# Patient Record
Sex: Female | Born: 1935 | Race: White | Hispanic: No | Smoking: Former smoker
Health system: Southern US, Community
[De-identification: ages and names within clinical notes are randomized; demographics above are authoritative.]

## PROBLEM LIST (undated history)

## (undated) DIAGNOSIS — K589 Irritable bowel syndrome without diarrhea: Secondary | ICD-10-CM

## (undated) DIAGNOSIS — B351 Tinea unguium: Secondary | ICD-10-CM

## (undated) DIAGNOSIS — C801 Malignant (primary) neoplasm, unspecified: Secondary | ICD-10-CM

## (undated) DIAGNOSIS — K219 Gastro-esophageal reflux disease without esophagitis: Secondary | ICD-10-CM

## (undated) DIAGNOSIS — I10 Essential (primary) hypertension: Secondary | ICD-10-CM

## (undated) DIAGNOSIS — C50919 Malignant neoplasm of unspecified site of unspecified female breast: Secondary | ICD-10-CM

## (undated) DIAGNOSIS — E7800 Pure hypercholesterolemia, unspecified: Secondary | ICD-10-CM

## (undated) DIAGNOSIS — K5730 Diverticulosis of large intestine without perforation or abscess without bleeding: Secondary | ICD-10-CM

## (undated) HISTORY — DX: Tinea unguium: B35.1

## (undated) HISTORY — PX: CHOLECYSTECTOMY: SHX55

## (undated) HISTORY — DX: Diverticulosis of large intestine without perforation or abscess without bleeding: K57.30

## (undated) HISTORY — DX: Malignant neoplasm of unspecified site of unspecified female breast: C50.919

## (undated) HISTORY — PX: CATARACT EXTRACTION: SUR2

## (undated) HISTORY — PX: BREAST LUMPECTOMY: SHX2

## (undated) HISTORY — DX: Irritable bowel syndrome, unspecified: K58.9

## (undated) HISTORY — DX: Pure hypercholesterolemia, unspecified: E78.00

## (undated) HISTORY — PX: KNEE ARTHROSCOPY: SUR90

## (undated) HISTORY — PX: ABDOMINAL HYSTERECTOMY: SHX81

## (undated) HISTORY — DX: Gastro-esophageal reflux disease without esophagitis: K21.9

---

## 1998-10-20 ENCOUNTER — Other Ambulatory Visit: Admission: RE | Admit: 1998-10-20 | Discharge: 1998-10-20 | Payer: Self-pay | Admitting: Obstetrics and Gynecology

## 1999-03-17 ENCOUNTER — Other Ambulatory Visit: Admission: RE | Admit: 1999-03-17 | Discharge: 1999-03-17 | Payer: Self-pay | Admitting: Surgery

## 1999-03-31 ENCOUNTER — Other Ambulatory Visit: Admission: RE | Admit: 1999-03-31 | Discharge: 1999-03-31 | Payer: Self-pay | Admitting: Surgery

## 2000-05-02 ENCOUNTER — Ambulatory Visit (HOSPITAL_COMMUNITY): Admission: RE | Admit: 2000-05-02 | Discharge: 2000-05-02 | Payer: Self-pay | Admitting: Internal Medicine

## 2000-07-13 ENCOUNTER — Encounter (HOSPITAL_BASED_OUTPATIENT_CLINIC_OR_DEPARTMENT_OTHER): Payer: Self-pay | Admitting: Internal Medicine

## 2000-07-13 ENCOUNTER — Encounter: Admission: RE | Admit: 2000-07-13 | Discharge: 2000-07-13 | Payer: Self-pay | Admitting: Internal Medicine

## 2001-10-07 ENCOUNTER — Encounter: Admission: RE | Admit: 2001-10-07 | Discharge: 2001-10-07 | Payer: Self-pay | Admitting: Internal Medicine

## 2001-10-07 ENCOUNTER — Encounter (HOSPITAL_BASED_OUTPATIENT_CLINIC_OR_DEPARTMENT_OTHER): Payer: Self-pay | Admitting: Internal Medicine

## 2001-11-01 ENCOUNTER — Encounter: Payer: Self-pay | Admitting: Surgery

## 2001-11-07 ENCOUNTER — Encounter: Payer: Self-pay | Admitting: Surgery

## 2001-11-07 ENCOUNTER — Inpatient Hospital Stay (HOSPITAL_COMMUNITY): Admission: RE | Admit: 2001-11-07 | Discharge: 2001-11-08 | Payer: Self-pay | Admitting: Surgery

## 2002-09-23 ENCOUNTER — Other Ambulatory Visit: Admission: RE | Admit: 2002-09-23 | Discharge: 2002-09-23 | Payer: Self-pay | Admitting: Obstetrics and Gynecology

## 2010-11-08 ENCOUNTER — Other Ambulatory Visit (HOSPITAL_COMMUNITY): Payer: Self-pay | Admitting: Internal Medicine

## 2010-11-08 DIAGNOSIS — R5383 Other fatigue: Secondary | ICD-10-CM

## 2010-11-08 DIAGNOSIS — J90 Pleural effusion, not elsewhere classified: Secondary | ICD-10-CM

## 2010-11-09 ENCOUNTER — Other Ambulatory Visit: Payer: Self-pay | Admitting: Interventional Radiology

## 2010-11-09 ENCOUNTER — Ambulatory Visit (HOSPITAL_COMMUNITY)
Admission: RE | Admit: 2010-11-09 | Discharge: 2010-11-09 | Disposition: A | Discharge status: Home / Self Care | Payer: Medicare Other | Source: Ambulatory Visit | Attending: Internal Medicine | Admitting: Internal Medicine

## 2010-11-09 DIAGNOSIS — J984 Other disorders of lung: Secondary | ICD-10-CM | POA: Insufficient documentation

## 2010-11-09 DIAGNOSIS — R05 Cough: Secondary | ICD-10-CM | POA: Insufficient documentation

## 2010-11-09 DIAGNOSIS — R0602 Shortness of breath: Secondary | ICD-10-CM | POA: Insufficient documentation

## 2010-11-09 DIAGNOSIS — R5383 Other fatigue: Secondary | ICD-10-CM

## 2010-11-09 DIAGNOSIS — R630 Anorexia: Secondary | ICD-10-CM | POA: Insufficient documentation

## 2010-11-09 DIAGNOSIS — Z01812 Encounter for preprocedural laboratory examination: Secondary | ICD-10-CM | POA: Insufficient documentation

## 2010-11-09 DIAGNOSIS — J90 Pleural effusion, not elsewhere classified: Secondary | ICD-10-CM

## 2010-11-09 DIAGNOSIS — R059 Cough, unspecified: Secondary | ICD-10-CM | POA: Insufficient documentation

## 2010-11-09 DIAGNOSIS — E041 Nontoxic single thyroid nodule: Secondary | ICD-10-CM | POA: Insufficient documentation

## 2010-11-09 DIAGNOSIS — R599 Enlarged lymph nodes, unspecified: Secondary | ICD-10-CM | POA: Insufficient documentation

## 2010-11-09 DIAGNOSIS — R5381 Other malaise: Secondary | ICD-10-CM | POA: Insufficient documentation

## 2010-11-09 LAB — BODY FLUID CELL COUNT WITH DIFFERENTIAL
Eos, Fluid: 0 %
Lymphs, Fluid: 30 %
Monocyte-Macrophage-Serous Fluid: 65 % (ref 50–90)
Neutrophil Count, Fluid: 5 % (ref 0–25)
Total Nucleated Cell Count, Fluid: 839 cu mm (ref 0–1000)

## 2010-11-09 MED ORDER — IOHEXOL 300 MG/ML  SOLN
80.0000 mL | Freq: Once | INTRAMUSCULAR | Status: AC | PRN
  Administered 2010-11-09: 80 mL via INTRAVENOUS

## 2010-11-10 ENCOUNTER — Other Ambulatory Visit (HOSPITAL_COMMUNITY): Payer: Self-pay

## 2010-11-10 LAB — PATHOLOGIST SMEAR REVIEW

## 2010-11-13 LAB — BODY FLUID CULTURE
Culture: NO GROWTH
Gram Stain: NONE SEEN

## 2010-11-15 ENCOUNTER — Other Ambulatory Visit: Payer: Self-pay | Admitting: Oncology

## 2010-11-15 DIAGNOSIS — C50919 Malignant neoplasm of unspecified site of unspecified female breast: Secondary | ICD-10-CM

## 2010-11-16 ENCOUNTER — Other Ambulatory Visit: Payer: Self-pay | Admitting: Radiology

## 2010-11-18 ENCOUNTER — Encounter (HOSPITAL_COMMUNITY): Payer: Self-pay

## 2010-11-18 ENCOUNTER — Encounter (HOSPITAL_COMMUNITY)
Admission: RE | Admit: 2010-11-18 | Discharge: 2010-11-18 | Disposition: A | Discharge status: Home / Self Care | Payer: Medicare Other | Source: Ambulatory Visit | Attending: Oncology | Admitting: Oncology

## 2010-11-18 DIAGNOSIS — J984 Other disorders of lung: Secondary | ICD-10-CM | POA: Insufficient documentation

## 2010-11-18 DIAGNOSIS — Z79899 Other long term (current) drug therapy: Secondary | ICD-10-CM | POA: Insufficient documentation

## 2010-11-18 DIAGNOSIS — C787 Secondary malignant neoplasm of liver and intrahepatic bile duct: Secondary | ICD-10-CM | POA: Insufficient documentation

## 2010-11-18 DIAGNOSIS — J90 Pleural effusion, not elsewhere classified: Secondary | ICD-10-CM | POA: Insufficient documentation

## 2010-11-18 DIAGNOSIS — Z7982 Long term (current) use of aspirin: Secondary | ICD-10-CM | POA: Insufficient documentation

## 2010-11-18 DIAGNOSIS — C773 Secondary and unspecified malignant neoplasm of axilla and upper limb lymph nodes: Secondary | ICD-10-CM | POA: Insufficient documentation

## 2010-11-18 DIAGNOSIS — C50919 Malignant neoplasm of unspecified site of unspecified female breast: Secondary | ICD-10-CM | POA: Insufficient documentation

## 2010-11-18 HISTORY — DX: Malignant (primary) neoplasm, unspecified: C80.1

## 2010-11-18 MED ORDER — FLUDEOXYGLUCOSE F - 18 (FDG) INJECTION
18.1000 | Freq: Once | INTRAVENOUS | Status: AC | PRN
  Administered 2010-11-18: 18.1 via INTRAVENOUS

## 2010-11-21 ENCOUNTER — Other Ambulatory Visit: Payer: Self-pay | Admitting: Oncology

## 2010-11-21 ENCOUNTER — Other Ambulatory Visit (HOSPITAL_COMMUNITY): Payer: Medicare Other

## 2010-11-21 ENCOUNTER — Ambulatory Visit (HOSPITAL_COMMUNITY)
Admission: RE | Admit: 2010-11-21 | Discharge: 2010-11-21 | Disposition: A | Discharge status: Home / Self Care | Payer: Medicare Other | Source: Ambulatory Visit | Attending: Oncology | Admitting: Oncology

## 2010-11-21 ENCOUNTER — Encounter (HOSPITAL_BASED_OUTPATIENT_CLINIC_OR_DEPARTMENT_OTHER): Payer: Medicare Other | Admitting: Oncology

## 2010-11-21 DIAGNOSIS — C50919 Malignant neoplasm of unspecified site of unspecified female breast: Secondary | ICD-10-CM

## 2010-11-21 DIAGNOSIS — C801 Malignant (primary) neoplasm, unspecified: Secondary | ICD-10-CM

## 2010-11-21 DIAGNOSIS — R0602 Shortness of breath: Secondary | ICD-10-CM

## 2010-11-21 DIAGNOSIS — C787 Secondary malignant neoplasm of liver and intrahepatic bile duct: Secondary | ICD-10-CM

## 2010-11-21 DIAGNOSIS — Z853 Personal history of malignant neoplasm of breast: Secondary | ICD-10-CM

## 2010-11-21 DIAGNOSIS — C7800 Secondary malignant neoplasm of unspecified lung: Secondary | ICD-10-CM

## 2010-11-21 DIAGNOSIS — J90 Pleural effusion, not elsewhere classified: Secondary | ICD-10-CM | POA: Insufficient documentation

## 2010-11-21 LAB — CBC WITH DIFFERENTIAL/PLATELET
EOS%: 3.4 % (ref 0.0–7.0)
Eosinophils Absolute: 0.3 10*3/uL (ref 0.0–0.5)
MCV: 88.2 fL (ref 79.5–101.0)
MONO%: 5.7 % (ref 0.0–14.0)
NEUT#: 6.3 10*3/uL (ref 1.5–6.5)
RBC: 4.44 10*6/uL (ref 3.70–5.45)
RDW: 13.7 % (ref 11.2–14.5)
lymph#: 1.9 10*3/uL (ref 0.9–3.3)

## 2010-11-21 LAB — COMPREHENSIVE METABOLIC PANEL
Alkaline Phosphatase: 35 U/L — ABNORMAL LOW (ref 39–117)
BUN: 15 mg/dL (ref 6–23)
Glucose, Bld: 128 mg/dL — ABNORMAL HIGH (ref 70–99)
Sodium: 140 mEq/L (ref 135–145)
Total Bilirubin: 0.8 mg/dL (ref 0.3–1.2)

## 2010-11-21 LAB — GLUCOSE, CAPILLARY: Glucose-Capillary: 120 mg/dL — ABNORMAL HIGH (ref 70–99)

## 2010-11-21 LAB — PROTIME-INR: Protime: 12 Seconds (ref 10.6–13.4)

## 2010-11-22 ENCOUNTER — Other Ambulatory Visit: Payer: Self-pay | Admitting: Oncology

## 2010-11-22 DIAGNOSIS — K769 Liver disease, unspecified: Secondary | ICD-10-CM

## 2010-11-24 ENCOUNTER — Other Ambulatory Visit: Payer: Self-pay | Admitting: Oncology

## 2010-11-24 ENCOUNTER — Ambulatory Visit (HOSPITAL_COMMUNITY)
Admission: RE | Admit: 2010-11-24 | Discharge: 2010-11-24 | Disposition: A | Discharge status: Home / Self Care | Payer: Medicare Other | Source: Ambulatory Visit | Attending: Oncology | Admitting: Oncology

## 2010-11-24 DIAGNOSIS — C50919 Malignant neoplasm of unspecified site of unspecified female breast: Secondary | ICD-10-CM | POA: Insufficient documentation

## 2010-11-24 DIAGNOSIS — J90 Pleural effusion, not elsewhere classified: Secondary | ICD-10-CM | POA: Insufficient documentation

## 2010-11-24 DIAGNOSIS — Z9889 Other specified postprocedural states: Secondary | ICD-10-CM

## 2010-11-24 DIAGNOSIS — J9819 Other pulmonary collapse: Secondary | ICD-10-CM | POA: Insufficient documentation

## 2010-11-25 ENCOUNTER — Encounter: Payer: Medicare Other | Admitting: Oncology

## 2010-11-25 ENCOUNTER — Other Ambulatory Visit: Payer: Self-pay | Admitting: Interventional Radiology

## 2010-11-25 ENCOUNTER — Ambulatory Visit (HOSPITAL_COMMUNITY)
Admission: RE | Admit: 2010-11-25 | Discharge: 2010-11-25 | Disposition: A | Discharge status: Home / Self Care | Payer: Medicare Other | Source: Ambulatory Visit | Attending: Oncology | Admitting: Oncology

## 2010-11-25 DIAGNOSIS — Z01810 Encounter for preprocedural cardiovascular examination: Secondary | ICD-10-CM | POA: Insufficient documentation

## 2010-11-25 DIAGNOSIS — K769 Liver disease, unspecified: Secondary | ICD-10-CM

## 2010-11-25 DIAGNOSIS — Z01812 Encounter for preprocedural laboratory examination: Secondary | ICD-10-CM | POA: Insufficient documentation

## 2010-11-25 DIAGNOSIS — Z853 Personal history of malignant neoplasm of breast: Secondary | ICD-10-CM | POA: Insufficient documentation

## 2010-11-25 DIAGNOSIS — I10 Essential (primary) hypertension: Secondary | ICD-10-CM | POA: Insufficient documentation

## 2010-11-25 DIAGNOSIS — C787 Secondary malignant neoplasm of liver and intrahepatic bile duct: Secondary | ICD-10-CM | POA: Insufficient documentation

## 2010-11-25 LAB — CBC
Hemoglobin: 14.1 g/dL (ref 12.0–15.0)
MCH: 30.1 pg (ref 26.0–34.0)
MCV: 87.6 fL (ref 78.0–100.0)
RBC: 4.69 MIL/uL (ref 3.87–5.11)

## 2010-11-29 ENCOUNTER — Other Ambulatory Visit: Payer: Self-pay | Admitting: Oncology

## 2010-11-29 ENCOUNTER — Ambulatory Visit (HOSPITAL_COMMUNITY)
Admission: RE | Admit: 2010-11-29 | Discharge: 2010-11-29 | Disposition: A | Discharge status: Home / Self Care | Payer: Medicare Other | Source: Ambulatory Visit | Attending: Oncology | Admitting: Oncology

## 2010-11-29 ENCOUNTER — Encounter (HOSPITAL_BASED_OUTPATIENT_CLINIC_OR_DEPARTMENT_OTHER): Payer: Medicare Other | Admitting: Oncology

## 2010-11-29 DIAGNOSIS — C7800 Secondary malignant neoplasm of unspecified lung: Secondary | ICD-10-CM

## 2010-11-29 DIAGNOSIS — C787 Secondary malignant neoplasm of liver and intrahepatic bile duct: Secondary | ICD-10-CM

## 2010-11-29 DIAGNOSIS — J90 Pleural effusion, not elsewhere classified: Secondary | ICD-10-CM | POA: Insufficient documentation

## 2010-11-29 DIAGNOSIS — Z853 Personal history of malignant neoplasm of breast: Secondary | ICD-10-CM

## 2010-11-29 DIAGNOSIS — C50919 Malignant neoplasm of unspecified site of unspecified female breast: Secondary | ICD-10-CM | POA: Insufficient documentation

## 2010-11-29 DIAGNOSIS — C801 Malignant (primary) neoplasm, unspecified: Secondary | ICD-10-CM

## 2010-11-30 ENCOUNTER — Other Ambulatory Visit (HOSPITAL_COMMUNITY): Payer: Medicare Other

## 2010-12-28 ENCOUNTER — Other Ambulatory Visit: Payer: Self-pay | Admitting: Oncology

## 2010-12-28 ENCOUNTER — Ambulatory Visit (HOSPITAL_COMMUNITY)
Admission: RE | Admit: 2010-12-28 | Discharge: 2010-12-28 | Disposition: A | Discharge status: Home / Self Care | Payer: Medicare Other | Source: Ambulatory Visit | Attending: Oncology | Admitting: Oncology

## 2010-12-28 DIAGNOSIS — J90 Pleural effusion, not elsewhere classified: Secondary | ICD-10-CM

## 2010-12-28 DIAGNOSIS — C801 Malignant (primary) neoplasm, unspecified: Secondary | ICD-10-CM | POA: Insufficient documentation

## 2010-12-28 DIAGNOSIS — C50919 Malignant neoplasm of unspecified site of unspecified female breast: Secondary | ICD-10-CM

## 2010-12-29 ENCOUNTER — Other Ambulatory Visit: Payer: Self-pay | Admitting: Oncology

## 2010-12-29 ENCOUNTER — Encounter (HOSPITAL_BASED_OUTPATIENT_CLINIC_OR_DEPARTMENT_OTHER): Payer: Medicare Other | Admitting: Oncology

## 2010-12-29 DIAGNOSIS — C801 Malignant (primary) neoplasm, unspecified: Secondary | ICD-10-CM

## 2010-12-29 DIAGNOSIS — Z853 Personal history of malignant neoplasm of breast: Secondary | ICD-10-CM

## 2010-12-29 DIAGNOSIS — C787 Secondary malignant neoplasm of liver and intrahepatic bile duct: Secondary | ICD-10-CM

## 2010-12-29 DIAGNOSIS — C7800 Secondary malignant neoplasm of unspecified lung: Secondary | ICD-10-CM

## 2010-12-29 DIAGNOSIS — C50919 Malignant neoplasm of unspecified site of unspecified female breast: Secondary | ICD-10-CM

## 2010-12-29 LAB — CBC WITH DIFFERENTIAL/PLATELET
Basophils Absolute: 0 10*3/uL (ref 0.0–0.1)
Eosinophils Absolute: 0.3 10*3/uL (ref 0.0–0.5)
HCT: 42.9 % (ref 34.8–46.6)
HGB: 14.5 g/dL (ref 11.6–15.9)
MCH: 29.7 pg (ref 25.1–34.0)
MCV: 87.9 fL (ref 79.5–101.0)
MONO%: 6.1 % (ref 0.0–14.0)
NEUT#: 7.2 10*3/uL — ABNORMAL HIGH (ref 1.5–6.5)
NEUT%: 75 % (ref 38.4–76.8)
Platelets: 205 10*3/uL (ref 145–400)
RDW: 13.8 % (ref 11.2–14.5)

## 2010-12-29 LAB — COMPREHENSIVE METABOLIC PANEL
Albumin: 3.9 g/dL (ref 3.5–5.2)
Alkaline Phosphatase: 46 U/L (ref 39–117)
BUN: 17 mg/dL (ref 6–23)
Calcium: 9.4 mg/dL (ref 8.4–10.5)
Creatinine, Ser: 1.1 mg/dL (ref 0.50–1.10)
Glucose, Bld: 125 mg/dL — ABNORMAL HIGH (ref 70–99)
Potassium: 4.2 mEq/L (ref 3.5–5.3)

## 2011-01-24 ENCOUNTER — Other Ambulatory Visit: Payer: Self-pay | Admitting: Oncology

## 2011-01-24 ENCOUNTER — Ambulatory Visit (HOSPITAL_COMMUNITY)
Admission: RE | Admit: 2011-01-24 | Discharge: 2011-01-24 | Disposition: A | Discharge status: Home / Self Care | Payer: Medicare Other | Source: Ambulatory Visit | Attending: Oncology | Admitting: Oncology

## 2011-01-24 DIAGNOSIS — J90 Pleural effusion, not elsewhere classified: Secondary | ICD-10-CM | POA: Insufficient documentation

## 2011-01-24 DIAGNOSIS — C50919 Malignant neoplasm of unspecified site of unspecified female breast: Secondary | ICD-10-CM

## 2011-01-24 DIAGNOSIS — R0602 Shortness of breath: Secondary | ICD-10-CM | POA: Insufficient documentation

## 2011-01-24 DIAGNOSIS — J984 Other disorders of lung: Secondary | ICD-10-CM | POA: Insufficient documentation

## 2011-01-24 DIAGNOSIS — M47814 Spondylosis without myelopathy or radiculopathy, thoracic region: Secondary | ICD-10-CM | POA: Insufficient documentation

## 2011-01-24 DIAGNOSIS — M47817 Spondylosis without myelopathy or radiculopathy, lumbosacral region: Secondary | ICD-10-CM | POA: Insufficient documentation

## 2011-01-24 DIAGNOSIS — R05 Cough: Secondary | ICD-10-CM | POA: Insufficient documentation

## 2011-01-24 DIAGNOSIS — R059 Cough, unspecified: Secondary | ICD-10-CM | POA: Insufficient documentation

## 2011-01-25 ENCOUNTER — Other Ambulatory Visit: Payer: Self-pay | Admitting: Oncology

## 2011-01-25 ENCOUNTER — Encounter (HOSPITAL_BASED_OUTPATIENT_CLINIC_OR_DEPARTMENT_OTHER): Payer: Medicare Other | Admitting: Oncology

## 2011-01-25 DIAGNOSIS — C50919 Malignant neoplasm of unspecified site of unspecified female breast: Secondary | ICD-10-CM

## 2011-01-25 LAB — CBC WITH DIFFERENTIAL/PLATELET
Eosinophils Absolute: 0.3 10*3/uL (ref 0.0–0.5)
HGB: 14.8 g/dL (ref 11.6–15.9)
MONO#: 0.7 10*3/uL (ref 0.1–0.9)
NEUT#: 5.4 10*3/uL (ref 1.5–6.5)
RBC: 5.18 10*6/uL (ref 3.70–5.45)
RDW: 13.5 % (ref 11.2–14.5)
WBC: 8.8 10*3/uL (ref 3.9–10.3)

## 2011-01-25 LAB — COMPREHENSIVE METABOLIC PANEL
Albumin: 4.4 g/dL (ref 3.5–5.2)
CO2: 26 mEq/L (ref 19–32)
Calcium: 10.1 mg/dL (ref 8.4–10.5)
Chloride: 101 mEq/L (ref 96–112)
Glucose, Bld: 103 mg/dL — ABNORMAL HIGH (ref 70–99)
Potassium: 4.4 mEq/L (ref 3.5–5.3)
Sodium: 139 mEq/L (ref 135–145)
Total Protein: 6.4 g/dL (ref 6.0–8.3)

## 2011-01-26 LAB — CANCER ANTIGEN 27.29: CA 27.29: 641 U/mL — ABNORMAL HIGH (ref 0–39)

## 2011-02-20 ENCOUNTER — Encounter (HOSPITAL_BASED_OUTPATIENT_CLINIC_OR_DEPARTMENT_OTHER): Payer: Medicare Other | Admitting: Oncology

## 2011-02-20 ENCOUNTER — Other Ambulatory Visit: Payer: Self-pay | Admitting: Oncology

## 2011-02-20 ENCOUNTER — Encounter (HOSPITAL_COMMUNITY): Payer: Self-pay

## 2011-02-20 ENCOUNTER — Ambulatory Visit (HOSPITAL_COMMUNITY)
Admission: RE | Admit: 2011-02-20 | Discharge: 2011-02-20 | Disposition: A | Discharge status: Home / Self Care | Payer: Medicare Other | Source: Ambulatory Visit | Attending: Oncology | Admitting: Oncology

## 2011-02-20 DIAGNOSIS — J90 Pleural effusion, not elsewhere classified: Secondary | ICD-10-CM | POA: Insufficient documentation

## 2011-02-20 DIAGNOSIS — C787 Secondary malignant neoplasm of liver and intrahepatic bile duct: Secondary | ICD-10-CM | POA: Insufficient documentation

## 2011-02-20 DIAGNOSIS — C50919 Malignant neoplasm of unspecified site of unspecified female breast: Secondary | ICD-10-CM | POA: Insufficient documentation

## 2011-02-20 DIAGNOSIS — C7800 Secondary malignant neoplasm of unspecified lung: Secondary | ICD-10-CM | POA: Insufficient documentation

## 2011-02-20 HISTORY — DX: Essential (primary) hypertension: I10

## 2011-02-20 LAB — CBC WITH DIFFERENTIAL/PLATELET
BASO%: 0 % (ref 0.0–2.0)
LYMPH%: 22.4 % (ref 14.0–49.7)
MCHC: 33.8 g/dL (ref 31.5–36.0)
MONO#: 0.6 10*3/uL (ref 0.1–0.9)
Platelets: 199 10*3/uL (ref 145–400)
RBC: 4.63 10*6/uL (ref 3.70–5.45)
WBC: 8.3 10*3/uL (ref 3.9–10.3)
lymph#: 1.8 10*3/uL (ref 0.9–3.3)

## 2011-02-20 LAB — CMP (CANCER CENTER ONLY)
ALT(SGPT): 19 U/L (ref 10–47)
CO2: 26 mEq/L (ref 18–33)
Sodium: 141 mEq/L (ref 128–145)
Total Bilirubin: 1.1 mg/dl (ref 0.20–1.60)
Total Protein: 6.9 g/dL (ref 6.4–8.1)

## 2011-02-20 MED ORDER — IOHEXOL 300 MG/ML  SOLN
100.0000 mL | Freq: Once | INTRAMUSCULAR | Status: AC | PRN
  Administered 2011-02-20: 100 mL via INTRAVENOUS

## 2011-02-21 LAB — CANCER ANTIGEN 27.29: CA 27.29: 668 U/mL — ABNORMAL HIGH (ref 0–39)

## 2011-02-27 ENCOUNTER — Encounter (HOSPITAL_BASED_OUTPATIENT_CLINIC_OR_DEPARTMENT_OTHER): Payer: Medicare Other | Admitting: Oncology

## 2011-02-27 DIAGNOSIS — C7800 Secondary malignant neoplasm of unspecified lung: Secondary | ICD-10-CM

## 2011-02-27 DIAGNOSIS — Z853 Personal history of malignant neoplasm of breast: Secondary | ICD-10-CM

## 2011-02-27 DIAGNOSIS — C801 Malignant (primary) neoplasm, unspecified: Secondary | ICD-10-CM

## 2011-02-27 DIAGNOSIS — C787 Secondary malignant neoplasm of liver and intrahepatic bile duct: Secondary | ICD-10-CM

## 2011-05-09 ENCOUNTER — Telehealth: Payer: Self-pay | Admitting: Oncology

## 2011-05-09 NOTE — Telephone Encounter (Signed)
called pts home lmovm for appt in jan2013 and to rtn call to confirm appts

## 2011-05-20 ENCOUNTER — Telehealth: Payer: Self-pay | Admitting: Oncology

## 2011-05-20 NOTE — Telephone Encounter (Signed)
1/9 1/16 already [facility] 4/18 appt and put in a note for pt to have sch printed   aom

## 2011-05-31 ENCOUNTER — Other Ambulatory Visit: Payer: Self-pay | Admitting: Oncology

## 2011-05-31 ENCOUNTER — Other Ambulatory Visit: Payer: Medicare Other | Admitting: Lab

## 2011-05-31 LAB — CBC WITH DIFFERENTIAL/PLATELET
BASO%: 0.4 % (ref 0.0–2.0)
EOS%: 4.2 % (ref 0.0–7.0)
HCT: 38.8 % (ref 34.8–46.6)
LYMPH%: 26.9 % (ref 14.0–49.7)
MCH: 30.2 pg (ref 25.1–34.0)
MCHC: 34.5 g/dL (ref 31.5–36.0)
MONO%: 6.9 % (ref 0.0–14.0)
NEUT%: 61.6 % (ref 38.4–76.8)
Platelets: 189 10*3/uL (ref 145–400)

## 2011-05-31 LAB — COMPREHENSIVE METABOLIC PANEL
ALT: 21 U/L (ref 0–35)
AST: 19 U/L (ref 0–37)
Creatinine, Ser: 1.03 mg/dL (ref 0.50–1.10)
Total Bilirubin: 0.9 mg/dL (ref 0.3–1.2)

## 2011-06-07 ENCOUNTER — Ambulatory Visit (HOSPITAL_BASED_OUTPATIENT_CLINIC_OR_DEPARTMENT_OTHER): Payer: Medicare Other | Admitting: Physician Assistant

## 2011-06-07 ENCOUNTER — Ambulatory Visit (HOSPITAL_COMMUNITY)
Admission: RE | Admit: 2011-06-07 | Discharge: 2011-06-07 | Disposition: A | Discharge status: Home / Self Care | Payer: Medicare Other | Source: Ambulatory Visit | Attending: Physician Assistant | Admitting: Physician Assistant

## 2011-06-07 ENCOUNTER — Encounter: Payer: Self-pay | Admitting: Physician Assistant

## 2011-06-07 VITALS — BP 154/78 | HR 76 | Temp 97.6°F | Ht 63.0 in | Wt 167.0 lb

## 2011-06-07 DIAGNOSIS — C787 Secondary malignant neoplasm of liver and intrahepatic bile duct: Secondary | ICD-10-CM

## 2011-06-07 DIAGNOSIS — Z853 Personal history of malignant neoplasm of breast: Secondary | ICD-10-CM | POA: Insufficient documentation

## 2011-06-07 DIAGNOSIS — J9819 Other pulmonary collapse: Secondary | ICD-10-CM | POA: Insufficient documentation

## 2011-06-07 DIAGNOSIS — Z170 Estrogen receptor positive status [ER+]: Secondary | ICD-10-CM

## 2011-06-07 DIAGNOSIS — C50919 Malignant neoplasm of unspecified site of unspecified female breast: Secondary | ICD-10-CM

## 2011-06-07 HISTORY — DX: Malignant neoplasm of unspecified site of unspecified female breast: C50.919

## 2011-06-07 NOTE — Progress Notes (Signed)
Hematology and Oncology Follow Up Visit  Theresa Wallace [id number] 1923-09-01 78 y.o. 06/07/2011    HPI: Theresa Wallace is an 77 year old [place] woman I saw briefly when she moved to this area more than 5 years ago.  She had had a left breast cancer removed at [facility] in [place] in [phone]  It was grade 2, measured 1 cm maximally and was strongly estrogen and progesterone-receptor positive.  HER-2 was not checked.  This was T1b NX invasive ductal carcinoma, stage I, and she was treated after radiation with anastrozole for several years, eventually switching to Evista primarily for cost reasons.   More recently, she presented to Dr. Kevan Ny with fatigue and a dry cough.  He auscultated dullness to percussion at the left base and obtained a chest x-ray which confirmed the presence of a left pleural effusion.   Dr. Kevan Ny scheduled [name] for an ultrasound-guided left thoracentesis, and this was performed November 09, 2010.  Approximately 1 L of amber-colored fluid was removed.  Cytology from this procedure ([id number]) showed only reactive mesothelial cells.   The patient was then referred here and set up for a CT of the chest and a PET scan.  The CT of the chest on June 20th showed 2 slightly enlarged left axillary lymph nodes with some irregular contours but more importantly multiple pleural nodules in the left chest measuring up to 4.6 cm.  The right chest was normal.  There was no pericardial effusion.  There was only a small to moderate left pleural effusion remaining, and aside from left lower lobe collapse or consolidation, there was no evidence of lung parenchymal involvement.  There was no evidence of bone involvement and also no calcified pleural plaques and no worrisome bone involvement.  PET scan on June 27th showed the nodularity along the left pleura to be intensely hypermetabolic.  For example the large lobe nodule measuring 4.5 cm had an SUV max of 12.9.  There  was no hypermetabolic mediastinal nodal activity.  The to left axillary lymph nodes were very mildly hypermetabolic.  The left breast was clear.  There was 1 hypermetabolic lesion in the liver measuring 3.2 cm.  On June 27th, Dr. Yolanda Bonine performed fine-needle aspiration of a 7 mm abnormal-appearing lymph node in the lower left axilla.  The cytology from this procedure, however, ([name]) also was inconclusive showing no evidence of nodal tissue, mostly blood and fatty tissue    A liver biopsy 11/25/2010 showed metastatic adenocarcinoma, again ER positive, started on exemestane in early July 2012 and continues with good tolerance.    Interim History:   Theresa Wallace returns today accompanied by her significant other, Theresa Wallace, and her daughter,Theresa Wallace, for followup of her metastatic breast carcinoma. She is now been on the eczema stain since July of 2012 and is tolerating it very well. She feels a little cold at times, although she is not sure this is an actual hot flash. She has an occasional cough, no significant phlegm production. No increased shortness of breath. She has a history of irritable bowel syndrome, and alternates somewhat between normal bowel movements and loose stools. She has a little abdominal pain associated with this as well.  Otherwise Theresa Wallace seems to be doing very well. She's had no increased joint pain associated with the eczema stain. No vaginal dryness or discharge. No signs of abnormal bleeding.  A detailed review of systems is otherwise noncontributory as noted below.  Review of Systems: Constitutional:  no weight loss, fever,  night sweats and feels well Eyes: uses glasses [id number]  Cardiovascular: no chest pain or dyspnea on exertion Respiratory: positive for - cough negative for - hemoptysis, orthopnea, pleuritic pain or shortness of breath Neurological: negative Dermatological: negative Gastrointestinal: no abdominal pain, change in bowel habits, or black or bloody  stools Genito-Urinary: no dysuria, trouble voiding, or hematuria Hematological and Lymphatic: negative Breast: negative Musculoskeletal: occasional pain, left leg Remaining ROS negative.  PAST MEDICAL HISTORY:  Aside from the breast cancer history as noted above, includes: 1. Hypercholesterolemia. 2. Benign tremor. 3. Sigmoid diverticulosis. 4. Onychomycosis. 5. Irritable bowel syndrome. 6. GERD. 7. History of palpitations with Holter monitor in 2008 showing PACs. 8. History of hysterectomy with no salpingo-oophorectomy. 9. History of cholecystectomy. 10. History of bilateral knee arthroscopic surgery. 11. History of hemorrhoid surgery.  FAMILY HISTORY:  [name] died in an automobile accident.  [name] died with "bone cancer."  The patient had 3 sisters.  One had breast cancer develop in her 25s.  There is also a cousin with breast cancer but no history of ovarian cancer or other history of cancer in first-degree relatives.  GYNECOLOGIC HISTORY:  She is GX, P4, first pregnancy to term at age 78.  She was on the hormone replacement after her hysterectomy but only briefly and stopped when she had the initial diagnosis of breast cancer in 1996.  SOCIAL HISTORY:  She worked as a Diplomatic Services operational officer.  She has been widowed for 7 years, her husband [name] dying after a long illness involving strokes and a myocardial infarction.  She lives with Theresa Wallace who is a retired Art gallery manager.  Present today also are her daughter Theresa Wallace, who used to be a Diplomatic Services operational officer for Dr. Kevan Ny, and Theresa Wallace's husband, Theresa Wallace, who is a physician's assistant with Dr. Cleophas Dunker.  The patient has 12 grandchildren and 4 great-grandchildren with 1 more on the way.  HEALTH MAINTENANCE:  She tells me she had a colonoscopy within the last 10 years under [facility].  She has a 20-pack-year history of tobacco abuse but quit about 40 years ago.    Medications:   I have reviewed the patient's current  medications.  Current Outpatient Prescriptions  Medication Sig Dispense Refill  . aspirin 81 MG tablet Take 81 mg by mouth daily.      . Calcium Carbonate-Vitamin D (CALCIUM-VITAMIN D) 500-200 MG-UNIT per tablet Take 1 tablet by mouth 2 (two) times daily with a meal.      . cholecalciferol (VITAMIN D) 400 UNITS TABS Take 2,000 Units by mouth daily.      Marland Kitchen exemestane (AROMASIN) 25 MG tablet Take 25 mg by mouth daily after breakfast.      . glucosamine-chondroitin 500-400 MG tablet Take 1 tablet by mouth daily.      Marland Kitchen levothyroxine (SYNTHROID, LEVOTHROID) 25 MCG tablet Take 25 mcg by mouth daily.      Marland Kitchen LORazepam (ATIVAN) 0.5 MG tablet Take 0.25 mg by mouth 2 (two) times daily as needed.      . Multiple Vitamin (MULTIVITAMIN) tablet Take 1 tablet by mouth daily.      . niacin (NIASPAN) 500 MG CR tablet Take 500 mg by mouth at bedtime.      Marland Kitchen omeprazole (PRILOSEC) 40 MG capsule Take 40 mg by mouth daily as needed.      . propranolol (INDERAL) 60 MG tablet Take 60 mg by mouth daily.        Allergies:  Allergies  Allergen Reactions  . Ciprofloxacin   .  Epinephrine   . Tamoxifen     Physical Exam: Filed Vitals:   06/07/11 1307  BP: 154/78  Pulse: 76  Temp: 97.6 F (36.4 C)   HEENT:  Sclerae anicteric, conjunctivae pink.  Oropharynx clear.  No mucositis or candidiasis.   Nodes:  No cervical, supraclavicular, or axillary lymphadenopathy palpated.  Breast Exam:  Right breast, benign, with no masses, skin changes, or nipple inversion. Left breast, status post lumpectomy. No suspicious nodularity or skin changes. No evidence of local recurrence. Lungs:  Clear to auscultation bilaterally.  No crackles, rhonchi, or wheezes.   Heart:  Regular rate and rhythm.   Abdomen:  Soft, nontender.  Positive bowel sounds.  No organomegaly or masses palpated.   Musculoskeletal:  No focal spinal tenderness to palpation.  Extremities:  Benign.  No peripheral edema or cyanosis.   Skin:  Benign.   Neuro:   Nonfocal.   Lab Results: Lab Results  Component Value Date   WBC 7.4 05/31/2011   HGB 13.4 05/31/2011   HCT 38.8 05/31/2011   MCV 87.5 05/31/2011   PLT 189 05/31/2011   NEUTROABS 4.6 05/31/2011     Chemistry      Component Value Date/Time   NA 140 05/31/2011 1343   NA 140 05/31/2011 1343   NA 141 02/20/2011 1228   K 4.8 05/31/2011 1343   K 4.8 05/31/2011 1343   K 4.4 02/20/2011 1228   CL 105 05/31/2011 1343   CL 105 05/31/2011 1343   CL 100 02/20/2011 1228   CO2 24 05/31/2011 1343   CO2 24 05/31/2011 1343   CO2 26 02/20/2011 1228   BUN 23 05/31/2011 1343   BUN 23 05/31/2011 1343   BUN 16 02/20/2011 1228   CREATININE 1.03 05/31/2011 1343   CREATININE 1.03 05/31/2011 1343   CREATININE 0.9 02/20/2011 1228      Component Value Date/Time   CALCIUM 9.7 05/31/2011 1343   CALCIUM 9.7 05/31/2011 1343   CALCIUM 9.1 02/20/2011 1228   ALKPHOS 49 05/31/2011 1343   ALKPHOS 49 05/31/2011 1343   ALKPHOS 57 02/20/2011 1228   AST 19 05/31/2011 1343   AST 19 05/31/2011 1343   AST 20 02/20/2011 1228   ALT 21 05/31/2011 1343   ALT 21 05/31/2011 1343   BILITOT 0.9 05/31/2011 1343   BILITOT 0.9 05/31/2011 1343   BILITOT 1.10 02/20/2011 1228     CA 27. 29 has decreased significantly from 668 in October 2012 to 91 when drawn last week on January 9.   Radiological Studies:  Bilateral screening mammogram at [facility] on 03/01/2011 was unremarkable with benign findings.  Results of the chest x-ray are pending.  Assessment:  An 78 year old [place] woman:  1. Status post left lumpectomy in 1996 for a T1b N0 grade 2 invasive ductal carcinoma which was strongly estrogen and progesterone receptor positive, treated with radiation, then anastrozole, then Evista, all treatment discontinued July 2012.  2. Recurrence to the lining of the left lung and liver documented by liver biopsy July 2012, showing metastatic adenocarcinoma which was estrogen receptor positive.  She has been on exemestane since early July of 2012.   3.     Last thoracentesis was  early in August 2012.  She has          an excellent functional status at present.    Plan:  Theresa Wallace continues to tolerate the eczema stain well and will continue, 25 mg daily. Per Dr. Darrall Dears previous note, we'll obtain a chest x-ray today.  She will then have a repeat CT of the chest open parent and not abdomen, so she will not need oral contrast) in early April, followed by an appointment with Dr. Darnelle Catalan that time.   This plan was reviewed with the patient, who voices understanding and agreement.  She knows to call with any changes or problems.    Zaidin Blyden, PA-C 06/07/2011

## 2011-06-26 ENCOUNTER — Telehealth: Payer: Self-pay | Admitting: *Deleted

## 2011-06-26 NOTE — Telephone Encounter (Signed)
Confirmed 07/03/11 appt w/ pt's grand-daughter Monica.

## 2011-07-03 ENCOUNTER — Ambulatory Visit: Payer: Medicare Other

## 2011-07-03 ENCOUNTER — Ambulatory Visit: Payer: Medicare Other | Admitting: Lab

## 2011-07-03 NOTE — Progress Notes (Signed)
Pt seen for genetic counseling.  Blood drawn for Multisite 3 with reflex to sequencing if neg

## 2011-07-12 ENCOUNTER — Telehealth: Payer: Self-pay | Admitting: Oncology

## 2011-07-12 NOTE — Telephone Encounter (Signed)
S/w the pt and she is aware of the change of the time of her appt on 09/07/2011@1 :45pm. Advised the pt to still keep the lab and ct scan appt the same

## 2011-07-14 ENCOUNTER — Other Ambulatory Visit: Payer: Self-pay | Admitting: *Deleted

## 2011-07-14 DIAGNOSIS — C50919 Malignant neoplasm of unspecified site of unspecified female breast: Secondary | ICD-10-CM

## 2011-07-14 MED ORDER — LORAZEPAM 0.5 MG PO TABS: ORAL_TABLET | ORAL | Status: DC

## 2011-08-30 ENCOUNTER — Other Ambulatory Visit (HOSPITAL_BASED_OUTPATIENT_CLINIC_OR_DEPARTMENT_OTHER): Payer: Medicare Other | Admitting: Lab

## 2011-08-30 ENCOUNTER — Ambulatory Visit (HOSPITAL_COMMUNITY)
Admission: RE | Admit: 2011-08-30 | Discharge: 2011-08-30 | Disposition: A | Discharge status: Home / Self Care | Payer: Medicare Other | Source: Ambulatory Visit | Attending: Physician Assistant | Admitting: Physician Assistant

## 2011-08-30 DIAGNOSIS — I700 Atherosclerosis of aorta: Secondary | ICD-10-CM | POA: Insufficient documentation

## 2011-08-30 DIAGNOSIS — C787 Secondary malignant neoplasm of liver and intrahepatic bile duct: Secondary | ICD-10-CM

## 2011-08-30 DIAGNOSIS — C50919 Malignant neoplasm of unspecified site of unspecified female breast: Secondary | ICD-10-CM | POA: Insufficient documentation

## 2011-08-30 LAB — CMP (CANCER CENTER ONLY)
ALT(SGPT): 23 U/L (ref 10–47)
CO2: 27 mEq/L (ref 18–33)
Chloride: 101 mEq/L (ref 98–108)
Sodium: 142 mEq/L (ref 128–145)
Total Bilirubin: 1.2 mg/dl (ref 0.20–1.60)
Total Protein: 6.9 g/dL (ref 6.4–8.1)

## 2011-08-30 MED ORDER — IOHEXOL 300 MG/ML  SOLN
80.0000 mL | Freq: Once | INTRAMUSCULAR | Status: AC | PRN
  Administered 2011-08-30: 80 mL via INTRAVENOUS

## 2011-09-07 ENCOUNTER — Other Ambulatory Visit: Payer: Medicare Other | Admitting: Lab

## 2011-09-07 ENCOUNTER — Telehealth: Payer: Self-pay | Admitting: *Deleted

## 2011-09-07 ENCOUNTER — Encounter: Payer: Self-pay | Admitting: Physician Assistant

## 2011-09-07 ENCOUNTER — Ambulatory Visit: Payer: Medicare Other | Admitting: Oncology

## 2011-09-07 ENCOUNTER — Ambulatory Visit (HOSPITAL_BASED_OUTPATIENT_CLINIC_OR_DEPARTMENT_OTHER): Payer: Medicare Other | Admitting: Physician Assistant

## 2011-09-07 ENCOUNTER — Ambulatory Visit (HOSPITAL_BASED_OUTPATIENT_CLINIC_OR_DEPARTMENT_OTHER): Payer: Medicare Other | Admitting: Lab

## 2011-09-07 VITALS — BP 122/69 | HR 72 | Temp 98.8°F | Ht 63.0 in | Wt 168.6 lb

## 2011-09-07 DIAGNOSIS — C800 Disseminated malignant neoplasm, unspecified: Secondary | ICD-10-CM

## 2011-09-07 DIAGNOSIS — Z170 Estrogen receptor positive status [ER+]: Secondary | ICD-10-CM

## 2011-09-07 DIAGNOSIS — C50919 Malignant neoplasm of unspecified site of unspecified female breast: Secondary | ICD-10-CM

## 2011-09-07 DIAGNOSIS — Z79811 Long term (current) use of aromatase inhibitors: Secondary | ICD-10-CM

## 2011-09-07 LAB — CBC WITH DIFFERENTIAL/PLATELET
BASO%: 0.4 % (ref 0.0–2.0)
EOS%: 3.1 % (ref 0.0–7.0)
LYMPH%: 26.9 % (ref 14.0–49.7)
MCHC: 32.9 g/dL (ref 31.5–36.0)
MONO#: 0.5 10*3/uL (ref 0.1–0.9)
MONO%: 6.3 % (ref 0.0–14.0)
Platelets: 176 10*3/uL (ref 145–400)
RBC: 4.45 10*6/uL (ref 3.70–5.45)
WBC: 7.9 10*3/uL (ref 3.9–10.3)
nRBC: 0 % (ref 0–0)

## 2011-09-07 LAB — CANCER ANTIGEN 27.29: CA 27.29: 36 U/mL (ref 0–39)

## 2011-09-07 NOTE — Progress Notes (Signed)
ID: Theresa Wallace   DOB: 1924-04-29  MR#: [id number]  NGE#:[id number]  HISTORY OF PRESENT ILLNESS: Theresa Wallace is an 78 year old [place] woman I saw briefly when she moved to this area more than 5 years ago. She had had a left breast cancer removed at [facility] in [place] in [phone]). It was grade 2, measured 1 cm maximally and was strongly estrogen and progesterone-receptor positive. HER-2 was not checked. This was T1b NX invasive ductal carcinoma, stage I, and she was treated after radiation with anastrozole for several years, eventually switching to Evista primarily for cost reasons.  More recently, she presented to Theresa Wallace with fatigue and a dry cough. He auscultated dullness to percussion at the left base and obtained a chest x-ray which confirmed the presence of a left pleural effusion.  Theresa Wallace scheduled [name] for an ultrasound-guided left thoracentesis, and this was performed November 09, 2010. Approximately 1 L of amber-colored fluid was removed. Cytology from this procedure ([id number]) showed only reactive mesothelial cells.  The patient was then referred here and set up for a CT of the chest and a PET scan. The CT of the chest on June 20th showed 2 slightly enlarged left axillary lymph nodes with some irregular contours but more importantly multiple pleural nodules in the left chest measuring up to 4.6 cm. The right chest was normal. There was no pericardial effusion. There was only a small to moderate left pleural effusion remaining, and aside from left lower lobe collapse or consolidation, there was no evidence of lung parenchymal involvement. There was no evidence of bone involvement and also no calcified pleural plaques and no worrisome bone involvement. PET scan on June 27th showed the nodularity along the left pleura to be intensely hypermetabolic. For example the large lobe nodule measuring 4.5 cm had an SUV max of 12.9. There was no hypermetabolic  mediastinal nodal activity. The to left axillary lymph nodes were very mildly hypermetabolic. The left breast was clear. There was 1 hypermetabolic lesion in the liver measuring 3.2 cm. On June 27th, Theresa Wallace performed fine-needle aspiration of a 7 mm abnormal-appearing lymph node in the lower left axilla. The cytology from this procedure, however, ([name]) also was inconclusive showing no evidence of nodal tissue, mostly blood and fatty tissue  A liver biopsy 11/25/2010 showed metastatic adenocarcinoma, again ER positive, started on exemestane in early July 2012 and continues with good tolerance.   The patient is known to be BRCA1 and BRCA2 negative.  INTERVAL HISTORY: [name] returns today accompanied by her significant other, [name], and her daughter, Theresa Wallace, for followup of metastatic breast carcinoma. Interval history is unremarkable, and [name] is feeling well. She is planning a trip to [place] to celebrate her granddaughter's upcoming wedding. (This is Theresa Wallace is middle daughter.)  [name] continues on exemestane which she is tolerating very well. She feels a little cold at times, but denies actual hot flashes. She's had no increased joint pain. No vaginal dryness.  Interval history is remarkable for recent chest CT showing resolution of a left pleural effusion and left pleural metastasis suggesting an excellent response to therapy. There were no additional abnormal findings.  REVIEW OF SYSTEMS: [name] has had no fevers or night sweats. No rashes or abnormal bleeding. Her nails seem to be a little more brittle than they used to be. She occasionally has some mild queasiness, but no emesis. No change in bowel habits. No cough, phlegm production, shortness of breath, or chest pain. She  has occasional mild headaches, but no dizziness or change in vision. She feels a little anxious or "jumpy" on occasion, but rarely uses her lorazepam.  A detailed review of systems is otherwise  noncontributory.  PAST MEDICAL HISTORY: Past Medical History  Diagnosis Date  . Hypertension   . Cancer     breast ca  . Breast cancer metastasized to multiple sites 06/07/2011    PAST SURGICAL HISTORY: No past surgical history on file.  FAMILY HISTORY No family history on file. [name] died in an automobile accident. [name] died with "bone cancer." The patient had 3 sisters. One had breast cancer develop in her 43s. There is also a cousin with breast cancer but no history of ovarian cancer or other history of cancer in first-degree relatives.  GYNECOLOGIC HISTORY: She is GX, P4, first pregnancy to term at age 29. She was on the hormone replacement after her hysterectomy but only briefly and stopped when she had the initial diagnosis of breast cancer in 1996.  SOCIAL HISTORY:  She worked as a Diplomatic Services operational officer. She has been widowed for 7 years, her husband Theresa Wallace dying after a long illness involving strokes and a myocardial infarction. She lives with Theresa Wallace who is a retired Art gallery manager. Present today also are her daughter Theresa Wallace, who used to be a Diplomatic Services operational officer for Theresa Wallace, and Theresa's husband, Theresa Wallace, who is a physician's assistant with Theresa Wallace. The patient has 12 grandchildren and 4 great-grandchildren with 1 more on the way.   ADVANCED DIRECTIVES:  HEALTH MAINTENANCE: History  Substance Use Topics  . Smoking status: Former Smoker    Quit date: 05/22/1968  . Smokeless tobacco: Never Used  . Alcohol Use: Yes     Colonoscopy: within the past 10 yrs, Theresa Wallace  PAP:  Bone density: Approx 10 yrs ago  Lipid panel:  Allergies  Allergen Reactions  . Ciprofloxacin   . Epinephrine   . Tamoxifen     Current Outpatient Prescriptions  Medication Sig Dispense Refill  . aspirin 81 MG tablet Take 81 mg by mouth daily.      . Calcium Carbonate-Vitamin D (CALCIUM-VITAMIN D) 500-200 MG-UNIT per tablet Take 1 tablet by mouth 2 (two) times daily  with a meal.      . cholecalciferol (VITAMIN D) 400 UNITS TABS Take 2,000 Units by mouth daily.      Marland Kitchen exemestane (AROMASIN) 25 MG tablet Take 25 mg by mouth daily after breakfast.      . glucosamine-chondroitin 500-400 MG tablet Take 1 tablet by mouth daily.      Marland Kitchen levothyroxine (SYNTHROID, LEVOTHROID) 25 MCG tablet Take 25 mcg by mouth daily.      Marland Kitchen LORazepam (ATIVAN) 0.5 MG tablet TAKE 1/2 TO ONE TABLET BY MOUTH TWICE DAILY PRN ANXIETY  15 tablet  0  . Multiple Vitamin (MULTIVITAMIN) tablet Take 1 tablet by mouth daily.      . niacin (NIASPAN) 500 MG CR tablet Take 500 mg by mouth at bedtime.      . propranolol (INDERAL) 60 MG tablet Take 60 mg by mouth daily.        OBJECTIVE: Filed Vitals:   09/07/11 1346  BP: 122/69  Pulse: 72  Temp: 98.8 F (37.1 C)     Body mass index is 29.87 kg/(m^2).    ECOG FS: 0  Physical Exam: HEENT:  Sclerae anicteric, conjunctivae pink.  Oropharynx clear.  Nodes:  No cervical, supraclavicular, or axillary lymphadenopathy palpated.  Breast Exam:  Right breast is benign, no masses, skin changes, or nipple inversion. Left breast status post lumpectomy with well-healed incision. No suspicious nodularities, skin changes, or evidence of local recurrence. Lungs:  Clear to auscultation bilaterally.  No crackles, rhonchi, or wheezes.   Heart:  Regular rate and rhythm.   Abdomen:  Soft, nontender.  Positive bowel sounds.  No organomegaly or masses palpated.   Musculoskeletal:  No focal spinal tenderness to palpation.  Extremities:  Benign.  No peripheral edema or cyanosis.   Skin:  Benign.   Neuro:  Nonfocal. Alert and oriented x3.    LAB RESULTS: Lab Results  Component Value Date   WBC 7.4 05/31/2011   NEUTROABS 4.6 05/31/2011   HGB 13.4 05/31/2011   HCT 38.8 05/31/2011   MCV 87.5 05/31/2011   PLT 189 05/31/2011      Chemistry      Component Value Date/Time   NA 142 08/30/2011 1253   NA 140 05/31/2011 1343   NA 140 05/31/2011 1343   K 4.2 08/30/2011 1253   K  4.8 05/31/2011 1343   K 4.8 05/31/2011 1343   CL 101 08/30/2011 1253   CL 105 05/31/2011 1343   CL 105 05/31/2011 1343   CO2 27 08/30/2011 1253   CO2 24 05/31/2011 1343   CO2 24 05/31/2011 1343   BUN 17 08/30/2011 1253   BUN 23 05/31/2011 1343   BUN 23 05/31/2011 1343   CREATININE 1.0 08/30/2011 1253   CREATININE 1.03 05/31/2011 1343   CREATININE 1.03 05/31/2011 1343      Component Value Date/Time   CALCIUM 9.1 08/30/2011 1253   CALCIUM 9.7 05/31/2011 1343   CALCIUM 9.7 05/31/2011 1343   ALKPHOS 45 08/30/2011 1253   ALKPHOS 49 05/31/2011 1343   ALKPHOS 49 05/31/2011 1343   AST 25 08/30/2011 1253   AST 19 05/31/2011 1343   AST 19 05/31/2011 1343   ALT 21 05/31/2011 1343   ALT 21 05/31/2011 1343   BILITOT 1.20 08/30/2011 1253   BILITOT 0.9 05/31/2011 1343   BILITOT 0.9 05/31/2011 1343       Lab Results  Component Value Date   LABCA2 91* 05/31/2011   Lab results from 09/07/2011 are pending, including a CBC and CA27.29.  STUDIES: Ct Chest W Contrast  08/30/2011  *RADIOLOGY REPORT*  Clinical Data: Metastatic breast cancer.  CT CHEST WITH CONTRAST  Technique:  Multidetector CT imaging of the chest was performed following the standard protocol during bolus administration of intravenous contrast.  Contrast: 80mL OMNIPAQUE IOHEXOL 300 MG/ML  SOLN chest CT 02/20/2011.  Comparison: Chest CT 02/20/2011.  Findings: The chest wall is stable.  No obvious breast mass, supraclavicular or axillary lymphadenopathy.  Stable surgical changes involving the left breast.  The bony thorax is intact.  No destructive bone lesions or spinal canal compromise.  The heart is normal in size.  No pericardial effusion.  There are small scattered mediastinal and hilar lymph nodes but no adenopathy.  The aorta is normal in caliber.  Stable atherosclerotic calcifications.  The esophagus is grossly normal.  Examination of the lung parenchyma demonstrates no acute pulmonary findings.  No pulmonary metastatic disease or pleural effusion. The numerous pleural  metastasis on the left are no longer discernible.  The left pleural effusion has resolved.  The upper abdomen is unremarkable.  No obvious hepatic metastasis.  IMPRESSION:  1.  Resolution of left pleural effusion and left pleural metastasis suggesting an excellent response to therapy. 2.  No findings for pulmonary  metastatic disease. 3.  No obvious liver metastasis.  Original Report Authenticated By: P. Loralie Champagne, M.D.    ASSESSMENT: An 78 year old [place] woman:  1. Status post left lumpectomy in 1996 for a T1b N0 grade 2 invasive ductal carcinoma which was strongly estrogen and progesterone receptor positive, treated with radiation, then anastrozole, then Evista, all treatment discontinued July 2012.  2. Recurrence to the lining of the left lung and liver documented by liver biopsy July 2012, showing metastatic adenocarcinoma which was estrogen receptor positive. She has been on exemestane since early July of 2012.  3. Last thoracentesis was early in August 2012. She has an excellent functional status at present.  4. Patient is known to be BRCA1 and BRCA2 negative.      PLAN: With regards to her metastatic breast cancer, [name] seems to be doing extremely well, and will continue on the exemestane as before. She'll return in 3 months for routine followup and repeat labs. Dr. Darnelle Catalan will decide at that time whether or not to proceed with any additional x-rays or scans.  The patient and her family voice understanding and agreement with this plan, and will call for any changes or problems.   Izabelle Daus    09/07/2011

## 2011-09-07 NOTE — Telephone Encounter (Signed)
gave patient appointment for 11-2011 printed out calendar and  gave to the patient 

## 2011-09-25 ENCOUNTER — Other Ambulatory Visit (HOSPITAL_COMMUNITY): Payer: Self-pay | Admitting: Internal Medicine

## 2011-09-25 DIAGNOSIS — Z5189 Encounter for other specified aftercare: Secondary | ICD-10-CM

## 2011-09-25 DIAGNOSIS — C50912 Malignant neoplasm of unspecified site of left female breast: Secondary | ICD-10-CM

## 2011-10-20 ENCOUNTER — Telehealth: Payer: Self-pay | Admitting: *Deleted

## 2011-10-20 NOTE — Telephone Encounter (Signed)
per patient request on 10-20-2011 moved appointments to 11-13-2011 lab only 11-20-2011 md at 11:00am

## 2011-10-23 ENCOUNTER — Ambulatory Visit (HOSPITAL_COMMUNITY)
Admission: RE | Admit: 2011-10-23 | Discharge: 2011-10-23 | Disposition: A | Discharge status: Home / Self Care | Payer: Medicare Other | Source: Ambulatory Visit | Attending: Internal Medicine | Admitting: Internal Medicine

## 2011-10-23 DIAGNOSIS — Z780 Asymptomatic menopausal state: Secondary | ICD-10-CM | POA: Insufficient documentation

## 2011-10-23 DIAGNOSIS — C50912 Malignant neoplasm of unspecified site of left female breast: Secondary | ICD-10-CM

## 2011-10-23 DIAGNOSIS — Z13820 Encounter for screening for osteoporosis: Secondary | ICD-10-CM | POA: Insufficient documentation

## 2011-10-23 DIAGNOSIS — Z5189 Encounter for other specified aftercare: Secondary | ICD-10-CM

## 2011-11-13 ENCOUNTER — Other Ambulatory Visit (HOSPITAL_BASED_OUTPATIENT_CLINIC_OR_DEPARTMENT_OTHER): Payer: Medicare Other | Admitting: Lab

## 2011-11-13 DIAGNOSIS — C50919 Malignant neoplasm of unspecified site of unspecified female breast: Secondary | ICD-10-CM

## 2011-11-13 LAB — COMPREHENSIVE METABOLIC PANEL
Albumin: 4.1 g/dL (ref 3.5–5.2)
CO2: 29 mEq/L (ref 19–32)
Glucose, Bld: 166 mg/dL — ABNORMAL HIGH (ref 70–99)
Potassium: 4.4 mEq/L (ref 3.5–5.3)
Sodium: 133 mEq/L — ABNORMAL LOW (ref 135–145)
Total Protein: 6.2 g/dL (ref 6.0–8.3)

## 2011-11-13 LAB — CBC WITH DIFFERENTIAL/PLATELET
Eosinophils Absolute: 0.1 10*3/uL (ref 0.0–0.5)
MONO#: 0.7 10*3/uL (ref 0.1–0.9)
NEUT#: 7.7 10*3/uL — ABNORMAL HIGH (ref 1.5–6.5)
Platelets: 191 10*3/uL (ref 145–400)
RBC: 4.51 10*6/uL (ref 3.70–5.45)
RDW: 14.6 % — ABNORMAL HIGH (ref 11.2–14.5)
WBC: 10.6 10*3/uL — ABNORMAL HIGH (ref 3.9–10.3)

## 2011-11-20 ENCOUNTER — Ambulatory Visit: Payer: Medicare Other | Admitting: Oncology

## 2011-11-21 ENCOUNTER — Ambulatory Visit (HOSPITAL_BASED_OUTPATIENT_CLINIC_OR_DEPARTMENT_OTHER): Payer: Medicare Other | Admitting: Oncology

## 2011-11-21 ENCOUNTER — Telehealth: Payer: Self-pay | Admitting: *Deleted

## 2011-11-21 VITALS — BP 146/77 | HR 76 | Temp 98.5°F | Ht 63.0 in | Wt 163.7 lb

## 2011-11-21 DIAGNOSIS — C50919 Malignant neoplasm of unspecified site of unspecified female breast: Secondary | ICD-10-CM

## 2011-11-21 DIAGNOSIS — C800 Disseminated malignant neoplasm, unspecified: Secondary | ICD-10-CM

## 2011-11-21 MED ORDER — GABAPENTIN 300 MG PO CAPS: 300.0000 mg | ORAL_CAPSULE | Freq: Every evening | ORAL | Status: DC | PRN

## 2011-11-21 NOTE — Telephone Encounter (Signed)
Gave patient appointment for 02-28-2012 starting at 1:15pm with labs gave patient instruction on getting the chest x-ray done before the appointment on 02-28-2012 at the radiology department

## 2011-11-21 NOTE — Progress Notes (Signed)
ID: Theresa Wallace   DOB: 01-28-1924  MR#: [id number]  WUJ#:[id number]  HISTORY OF PRESENT ILLNESS: Theresa Wallace is an 78 year old [place] woman I saw briefly when she moved to this area in 2007.  She had had a left breast cancer removed at [facility] in [place] in [phone]).  It was grade 2, measured 1 cm maximally and was strongly estrogen and progesterone-receptor positive.  HER-2 was not checked.  This was T1b NX invasive ductal carcinoma, stage I, and she was treated after radiation with anastrozole for several years, eventually switching to Evista primarily for cost reasons.   More recently, she presented to Dr. Kevan Ny with fatigue and a dry cough.  He auscultated dullness to percussion at the left base and obtained a chest x-ray which confirmed the presence of a left pleural effusion.   Dr. Kevan Ny scheduled Theresa Wallace for an ultrasound-guided left thoracentesis, and this was performed November 09, 2010.  Approximately 1 L of amber-colored fluid was removed.  Cytology from this procedure ([id number]) showed only reactive mesothelial cells.   The patient was then referred here and set up for a CT of the chest and a PET scan.  The CT of the chest on June 20th showed 2 slightly enlarged left axillary lymph nodes with some irregular contours but more importantly multiple pleural nodules in the left chest measuring up to 4.6 cm.  The right chest was normal.  There was no pericardial effusion.  There was only a small to moderate left pleural effusion remaining, and aside from left lower lobe collapse or consolidation, there was no evidence of lung parenchymal involvement.  There was no evidence of bone involvement and also no calcified pleural plaques and no worrisome bone involvement.  PET scan on June 27th showed the nodularity along the left pleura to be intensely hypermetabolic.  For example the large lobe nodule measuring 4.5 cm had an SUV max of 12.9.  There was no hypermetabolic  mediastinal nodal activity.  The to left axillary lymph nodes were very mildly hypermetabolic.  The left breast was clear.  There was 1 hypermetabolic lesion in the liver measuring 3.2 cm.  On June 27th, Dr. Yolanda Bonine performed fine-needle aspiration of a 7 mm abnormal-appearing lymph node in the lower left axilla.  The cytology from this procedure, however, ([name]) also was inconclusive showing no evidence of nodal tissue, mostly blood and fatty tissue. Her subsequent history is as detailed below  INTERVAL HISTORY: Theresa Wallace returns with her significant other, [name], for followup of her breast cancer. The interval history is significant for her having developed shingles involving the left flank and left breast second week in June. She was treated with Valtrex for this, but still is very uncomfortable.  REVIEW OF SYSTEMS: Aside from the pain, she is somewhat constipated from the narcotics and therefore does not like to take them. She has Lidoderm patches but is not using them right now. She feels tired, and falls asleep all the time. She sleeps okay at night according to [facility]. She has a little bit of hoarseness, some problems with urinary leakage, and aside from the shingles a rash in her upper left back which doesn't cause her any problems and which has been evaluated by Dr. Nicholas Lose. She is tolerating the letrozole with no side effects that she is aware of. A detailed review of systems is otherwise unremarkable.  PAST MEDICAL HISTORY: Past Medical History  Diagnosis Date  . Hypertension   . Cancer  breast ca  . Breast cancer metastasized to multiple sites 06/07/2011  1. Hypercholesterolemia. 2. Benign tremor. 3. Sigmoid diverticulosis. 4. Onychomycosis. 5. Irritable bowel syndrome. 6. GERD. 7. History of palpitations with Holter monitor in 2008 showing PACs. 8. History of hysterectomy with no salpingo-oophorectomy. 9. History of cholecystectomy. 10. History of bilateral knee arthroscopic  surgery. History of hemorrhoid surgery.  FAMILY HISTORY [name] died in an automobile accident.  [name] died with "bone cancer."  The patient had 3 sisters.  One had breast cancer develop in her 64s.  There is also a cousin with breast cancer but no history of ovarian cancer or other history of cancer in first-degree relatives.  GYNECOLOGIC HISTORY: She is GX, P4, first pregnancy to term at age 78.  She was on the hormone replacement after her hysterectomy but only briefly and stopped when she had the initial diagnosis of breast cancer in 1996.  SOCIAL HISTORY: She worked as a Diplomatic Services operational officer.  She has been widowed since 2005, her husband [name] dying after a long illness involving strokes and a myocardial infarction.  She lives with [name] who is a retired Art gallery manager.  Her daughter [name] used to be a Diplomatic Services operational officer for Dr. Kevan Ny, and [name]'s husband, [name], is a physician's assistant with Dr. Cleophas Dunker.  The patient has 12 grandchildren and 4 great-grandchildren with 1 more on the way.   ADVANCED DIRECTIVES: In place  HEALTH MAINTENANCE: History  Substance Use Topics  . Smoking status: Former Smoker    Quit date: 05/22/1968  . Smokeless tobacco: Never Used  . Alcohol Use: Yes     Colonoscopy:  PAP:  Bone density:  Lipid panel:  Allergies  Allergen Reactions  . Ciprofloxacin   . Epinephrine   . Tamoxifen     Current Outpatient Prescriptions  Medication Sig Dispense Refill  . Calcium Carbonate-Vitamin D (CALCIUM-VITAMIN D) 500-200 MG-UNIT per tablet Take 1 tablet by mouth 2 (two) times daily with a meal.      . cholecalciferol (VITAMIN D) 400 UNITS TABS Take 2,000 Units by mouth daily.      Marland Kitchen exemestane (AROMASIN) 25 MG tablet Take 25 mg by mouth daily after breakfast.      . glucosamine-chondroitin 500-400 MG tablet Take 1 tablet by mouth daily.      Marland Kitchen HYDROcodone-acetaminophen (VICODIN) 5-500 MG per tablet       . levothyroxine (SYNTHROID,  LEVOTHROID) 25 MCG tablet Take 25 mcg by mouth daily.      Marland Kitchen LORazepam (ATIVAN) 0.5 MG tablet TAKE 1/2 TO ONE TABLET BY MOUTH TWICE DAILY PRN ANXIETY  15 tablet  0  . Multiple Vitamin (MULTIVITAMIN) tablet Take 1 tablet by mouth daily.      . propranolol (INDERAL) 60 MG tablet Take 60 mg by mouth daily.      Marland Kitchen aspirin 81 MG tablet Take 81 mg by mouth daily.      . niacin (NIASPAN) 500 MG CR tablet Take 500 mg by mouth at bedtime.        OBJECTIVE: Elderly white woman who appears moderately uncomfortable Filed Vitals:   11/21/11 0937  BP: 146/77  Pulse: 76  Temp: 98.5 F (36.9 Wallace)     Body mass index is 29.00 kg/(m^2).    ECOG FS: 2  Sclerae unicteric; slight left ptosis Oropharynx clear No cervical or supraclavicular adenopathy Lungs no rales or rhonchi Heart regular rate and rhythm Abd benign MSK no focal spinal tenderness, no peripheral edema Neuro: nonfocal Breasts:  The right breast is unremarkable. The left breast is status post lumpectomy. There are several of non-confluent lesions on the left breast, some of which are crusted over. There is no evidence of disease recurrence  LAB RESULTS: Lab Results  Component Value Date   WBC 10.6* 11/13/2011   NEUTROABS 7.7* 11/13/2011   HGB 13.6 11/13/2011   HCT 40.2 11/13/2011   MCV 89.2 11/13/2011   PLT 191 11/13/2011      Chemistry      Component Value Date/Time   NA 133* 11/13/2011 0955   NA 142 08/30/2011 1253   K 4.4 11/13/2011 0955   K 4.2 08/30/2011 1253   CL 98 11/13/2011 0955   CL 101 08/30/2011 1253   CO2 29 11/13/2011 0955   CO2 27 08/30/2011 1253   BUN 20 11/13/2011 0955   BUN 17 08/30/2011 1253   CREATININE 0.91 11/13/2011 0955   CREATININE 1.0 08/30/2011 1253      Component Value Date/Time   CALCIUM 8.9 11/13/2011 0955   CALCIUM 9.1 08/30/2011 1253   ALKPHOS 49 11/13/2011 0955   ALKPHOS 45 08/30/2011 1253   AST 17 11/13/2011 0955   AST 25 08/30/2011 1253   ALT 30 11/13/2011 0955   BILITOT 1.1 11/13/2011 0955   BILITOT 1.20  08/30/2011 1253       Lab Results  Component Value Date   LABCA2 34 11/13/2011    No components found with this basename: [id number]    No results found for this basename: INR:1;PROTIME:1 in the last 168 hours  Urinalysis No results found for this basename: colorurine, appearanceur, labspec, phurine, glucoseu, hgbur, bilirubinur, ketonesur, proteinur, urobilinogen, nitrite, leukocytesur    STUDIES: Dg Bone Density  10/23/2011  The Bone Mineral Densitometry  Report was read as within normal limits   ASSESSMENT: 78 y.o. [place] woman:   (1) Status post left lumpectomy in 1996 for a T1b N0 grade 2 invasive ductal carcinoma which was strongly estrogen and progesterone receptor positive, treated with radiation, then anastrozole, then Evista, all treatment discontinued July 2012  (2) recurrence to the lining of the left lung and liver documented by liver biopsy July 2012, showing metastatic adenocarcinoma which was estrogen receptor positive.   (3)  She has been on exemestane since early July of 2012, with chest CT scan April 2013 showing complete resolution of her lung and liver metastases  PLAN: From a breast cancer point of view, the plan is to continue exemestane indefinitely. If and when there is disease progression we will add everolimus or switch to fulvestrant. Hopefully the discomfort from her shingles will improve soon. I wrote her for gabapentin 300 mg to take at bedtime as needed. She will see [place] again in October. She knows to call for any problems that may develop before the next visit.   Theresa Wallace    11/21/2011

## 2011-11-29 ENCOUNTER — Other Ambulatory Visit: Payer: Medicare Other | Admitting: Lab

## 2011-12-06 ENCOUNTER — Ambulatory Visit: Payer: Medicare Other | Admitting: Oncology

## 2012-02-28 ENCOUNTER — Other Ambulatory Visit (HOSPITAL_BASED_OUTPATIENT_CLINIC_OR_DEPARTMENT_OTHER): Payer: Medicare Other | Admitting: Lab

## 2012-02-28 ENCOUNTER — Telehealth: Payer: Self-pay | Admitting: Oncology

## 2012-02-28 ENCOUNTER — Ambulatory Visit (HOSPITAL_BASED_OUTPATIENT_CLINIC_OR_DEPARTMENT_OTHER): Payer: Medicare Other | Admitting: Physician Assistant

## 2012-02-28 ENCOUNTER — Encounter: Payer: Self-pay | Admitting: Physician Assistant

## 2012-02-28 ENCOUNTER — Ambulatory Visit (HOSPITAL_COMMUNITY)
Admission: RE | Admit: 2012-02-28 | Discharge: 2012-02-28 | Disposition: A | Discharge status: Home / Self Care | Payer: Medicare Other | Source: Ambulatory Visit | Attending: Oncology | Admitting: Oncology

## 2012-02-28 VITALS — BP 123/65 | HR 70 | Temp 97.5°F | Resp 20 | Ht 63.0 in | Wt 164.4 lb

## 2012-02-28 DIAGNOSIS — N649 Disorder of breast, unspecified: Secondary | ICD-10-CM

## 2012-02-28 DIAGNOSIS — Z170 Estrogen receptor positive status [ER+]: Secondary | ICD-10-CM

## 2012-02-28 DIAGNOSIS — C50919 Malignant neoplasm of unspecified site of unspecified female breast: Secondary | ICD-10-CM

## 2012-02-28 DIAGNOSIS — C800 Disseminated malignant neoplasm, unspecified: Secondary | ICD-10-CM

## 2012-02-28 DIAGNOSIS — I517 Cardiomegaly: Secondary | ICD-10-CM | POA: Insufficient documentation

## 2012-02-28 DIAGNOSIS — C787 Secondary malignant neoplasm of liver and intrahepatic bile duct: Secondary | ICD-10-CM

## 2012-02-28 DIAGNOSIS — M47814 Spondylosis without myelopathy or radiculopathy, thoracic region: Secondary | ICD-10-CM | POA: Insufficient documentation

## 2012-02-28 DIAGNOSIS — I700 Atherosclerosis of aorta: Secondary | ICD-10-CM | POA: Insufficient documentation

## 2012-02-28 DIAGNOSIS — C7800 Secondary malignant neoplasm of unspecified lung: Secondary | ICD-10-CM

## 2012-02-28 DIAGNOSIS — C801 Malignant (primary) neoplasm, unspecified: Secondary | ICD-10-CM | POA: Insufficient documentation

## 2012-02-28 LAB — COMPREHENSIVE METABOLIC PANEL (CC13)
Albumin: 4.1 g/dL (ref 3.5–5.0)
Alkaline Phosphatase: 49 U/L (ref 40–150)
BUN: 14 mg/dL (ref 7.0–26.0)
Calcium: 10 mg/dL (ref 8.4–10.4)
Chloride: 99 mEq/L (ref 98–107)
Creatinine: 0.9 mg/dL (ref 0.6–1.1)
Glucose: 86 mg/dl (ref 70–99)
Potassium: 4.4 mEq/L (ref 3.5–5.1)

## 2012-02-28 LAB — CBC WITH DIFFERENTIAL/PLATELET
Basophils Absolute: 0 10*3/uL (ref 0.0–0.1)
Eosinophils Absolute: 0.3 10*3/uL (ref 0.0–0.5)
HCT: 40.7 % (ref 34.8–46.6)
HGB: 13.7 g/dL (ref 11.6–15.9)
MCV: 87.3 fL (ref 79.5–101.0)
MONO%: 8.7 % (ref 0.0–14.0)
NEUT#: 5 10*3/uL (ref 1.5–6.5)
NEUT%: 62 % (ref 38.4–76.8)
RDW: 12.9 % (ref 11.2–14.5)

## 2012-02-28 NOTE — Progress Notes (Signed)
ID: [name]   DOB: 27-Sep-1923  MR#: [id number]  WUJ#:[id number]  HISTORY OF PRESENT ILLNESS: Theresa Wallace is an 78 year old [place] woman I saw briefly when she moved to this area in 2007.  She had had a left breast cancer removed at [facility] in [place] in [phone]  It was grade 2, measured 1 cm maximally and was strongly estrogen and progesterone-receptor positive.  HER-2 was not checked.  This was T1b NX invasive ductal carcinoma, stage I, and she was treated after radiation with anastrozole for several years, eventually switching to Evista primarily for cost reasons.   More recently, she presented to Dr. Kevan Wallace with fatigue and a dry cough.  He auscultated dullness to percussion at the left base and obtained a chest x-ray which confirmed the presence of a left pleural effusion.   Dr. Kevan Wallace scheduled Theresa Wallace for an ultrasound-guided left thoracentesis, and this was performed November 09, 2010.  Approximately 1 L of amber-colored fluid was removed.  Cytology from this procedure ([id number]) showed only reactive mesothelial cells.   The patient was then referred here and set up for a CT of the chest and a PET scan.  The CT of the chest on June 20th showed 2 slightly enlarged left axillary lymph nodes with some irregular contours but more importantly multiple pleural nodules in the left chest measuring up to 4.6 cm.  The right chest was normal.  There was no pericardial effusion.  There was only a small to moderate left pleural effusion remaining, and aside from left lower lobe collapse or consolidation, there was no evidence of lung parenchymal involvement.  There was no evidence of bone involvement and also no calcified pleural plaques and no worrisome bone involvement.  PET scan on June 27th showed the nodularity along the left pleura to be intensely hypermetabolic.  For example the large lobe nodule measuring 4.5 cm had an SUV max of 12.9.  There was no hypermetabolic  mediastinal nodal activity.  The to left axillary lymph nodes were very mildly hypermetabolic.  The left breast was clear.  There was 1 hypermetabolic lesion in the liver measuring 3.2 cm.  On June 27th, Dr. Yolanda Wallace performed fine-needle aspiration of a 7 mm abnormal-appearing lymph node in the lower left axilla.  The cytology from this procedure, however, ([name]) also was inconclusive showing no evidence of nodal tissue, mostly blood and fatty tissue. Her subsequent history is as detailed below  INTERVAL HISTORY: Theresa Wallace returns with her significant other, Theresa Wallace, for followup of her metastatic breast cancer. She continues on exemestane which she is tolerating very well. She has no problems with hot flashes and denies any increased joint pain. She did have a chest x-ray earlier today and we are awaiting those results.  Theresa Wallace is biggest complaint today is continued discomfort in the left breast secondary to a recent case of shingles over the summer. She tried taking gabapentin for the pain but was unable to tolerate that medication. She tells me she is just "dealing with it".   REVIEW OF SYSTEMS: Other than shingles, she has had no recent illnesses and denies fevers. She does feel very tired. She has chills for approximately one hour after taking exemestane every day, but these resolve. She has had no nausea or emesis. She has mild constipation for which she utilizes MiraLAX affectively. She's had no change in urinary habits. She denies any cough, increased shortness of breath, or chest pain. No abnormal headaches or dizziness. No peripheral  swelling.  A detailed review of systems is otherwise stable and noncontributory.  PAST MEDICAL HISTORY: Past Medical History  Diagnosis Date  . Hypertension   . Cancer     breast ca  . Breast cancer metastasized to multiple sites 06/07/2011  1. Hypercholesterolemia. 2. Benign tremor. 3. Sigmoid diverticulosis. 4. Onychomycosis. 5. Irritable bowel  syndrome. 6. GERD. 7. History of palpitations with Holter monitor in 2008 showing PACs. 8. History of hysterectomy with no salpingo-oophorectomy. 9. History of cholecystectomy. 10. History of bilateral knee arthroscopic surgery. History of hemorrhoid surgery.  FAMILY HISTORY [name] died in an automobile accident.  [name] died with "bone cancer."  The patient had 3 sisters.  One had breast cancer develop in her 32s.  There is also a cousin with breast cancer but no history of ovarian cancer or other history of cancer in first-degree relatives.  GYNECOLOGIC HISTORY: She is GX, P4, first pregnancy to term at age 74.  She was on the hormone replacement after her hysterectomy but only briefly and stopped when she had the initial diagnosis of breast cancer in 1996.  SOCIAL HISTORY: She worked as a Diplomatic Services operational officer.  She has been widowed since 2005, her husband Theresa Wallace dying after a long illness involving strokes and a myocardial infarction.  She lives with Theresa Wallace who is a retired Art gallery manager.  Her daughter Theresa Wallace used to be a Diplomatic Services operational officer for Dr. Kevan Wallace, and Theresa husband, [name], is a physician's assistant with Dr. Cleophas Wallace.  The patient has 12 grandchildren and 4 great-grandchildren with 1 more on the way.   ADVANCED DIRECTIVES: In place  HEALTH MAINTENANCE: History  Substance Use Topics  . Smoking status: Former Smoker    Quit date: 05/22/1968  . Smokeless tobacco: Never Used  . Alcohol Use: Yes     Colonoscopy:  PAP:  Bone density:  Lipid panel:  Allergies  Allergen Reactions  . Ciprofloxacin   . Epinephrine   . Tamoxifen     Current Outpatient Prescriptions  Medication Sig Dispense Refill  . aspirin 81 MG tablet Take 81 mg by mouth daily.      . Calcium Carbonate-Vitamin D (CALCIUM-VITAMIN D) 500-200 MG-UNIT per tablet Take 1 tablet by mouth 2 (two) times daily with a meal.      . cholecalciferol (VITAMIN D) 400 UNITS TABS Take 2,000 Units by  mouth daily.      Marland Kitchen exemestane (AROMASIN) 25 MG tablet Take 25 mg by mouth daily after breakfast.      . FINACEA 15 % cream       . Glucosamine HCl-MSM (GLUCOSAMINE-MSM PO) Take 1 tablet by mouth daily.      Marland Kitchen levothyroxine (SYNTHROID, LEVOTHROID) 25 MCG tablet Take 25 mcg by mouth daily.      . Multiple Vitamin (MULTIVITAMIN) tablet Take 1 tablet by mouth daily.      . niacin (NIASPAN) 500 MG CR tablet Take 500 mg by mouth at bedtime.      . propranolol (INDERAL) 60 MG tablet Take 60 mg by mouth daily.      Marland Kitchen HYDROcodone-acetaminophen (VICODIN) 5-500 MG per tablet       . LORazepam (ATIVAN) 0.5 MG tablet TAKE 1/2 TO ONE TABLET BY MOUTH TWICE DAILY PRN ANXIETY  15 tablet  0    OBJECTIVE: Elderly white woman who appears tired but in no acute distress  Filed Vitals:   02/28/12 1354  BP: 123/65  Pulse: 70  Temp: 97.5 F (36.4 C)  Resp: 20  Body mass index is 29.12 kg/(m^2).    ECOG FS: 1  Sclerae unicteric; slight left ptosis, stable Oropharynx clear No cervical or supraclavicular adenopathy; axillae are benign bilaterally with no adenopathy Lungs no rales or rhonchi Heart regular rate and rhythm Abd soft, nontender with positive bowel sounds MSK no focal spinal tenderness,  No peripheral edema Neuro: nonfocal, alert and oriented x3 Breasts: The right breast is unremarkable. The left breast is status post lumpectomy. The breast is mildly tender to palpation but there no suspicious nodularities and no evidence of disease recurrence.  There is an erythematous lesion on the lower portion of the breast measuring approximately 1.5 cm in diameter. This is slightly "crusty" and is slightly raised, possibly a Seborrheic  Keratosis.    LAB RESULTS: Lab Results  Component Value Date   WBC 8.1 02/28/2012   NEUTROABS 5.0 02/28/2012   HGB 13.7 02/28/2012   HCT 40.7 02/28/2012   MCV 87.3 02/28/2012   PLT 181 02/28/2012      Chemistry      Component Value Date/Time   NA 135* 02/28/2012 1156     NA 133* 11/13/2011 0955   NA 142 08/30/2011 1253   K 4.4 02/28/2012 1156   K 4.4 11/13/2011 0955   K 4.2 08/30/2011 1253   CL 99 02/28/2012 1156   CL 98 11/13/2011 0955   CL 101 08/30/2011 1253   CO2 24 02/28/2012 1156   CO2 29 11/13/2011 0955   CO2 27 08/30/2011 1253   BUN 14.0 02/28/2012 1156   BUN 20 11/13/2011 0955   BUN 17 08/30/2011 1253   CREATININE 0.9 02/28/2012 1156   CREATININE 0.91 11/13/2011 0955   CREATININE 1.0 08/30/2011 1253      Component Value Date/Time   CALCIUM 10.0 02/28/2012 1156   CALCIUM 8.9 11/13/2011 0955   CALCIUM 9.1 08/30/2011 1253   ALKPHOS 49 02/28/2012 1156   ALKPHOS 49 11/13/2011 0955   ALKPHOS 45 08/30/2011 1253   AST 18 02/28/2012 1156   AST 17 11/13/2011 0955   AST 25 08/30/2011 1253   ALT 19 02/28/2012 1156   ALT 30 11/13/2011 0955   BILITOT 1.40* 02/28/2012 1156   BILITOT 1.1 11/13/2011 0955   BILITOT 1.20 08/30/2011 1253       Lab Results  Component Value Date   LABCA2 34 11/13/2011    STUDIES: Chest x-ray was obtained today, 02/28/2012, with results pending.  Most recent bilateral mamm[facility] at [facility] on 01/15/2012 was unremarkable with the exception of dense breast tissue.  Dg Bone Density  10/23/2011  The Bone Mineral Densitometry  Report was read as within normal limits   ASSESSMENT: 78 y.o. [place] woman:   (1) Status post left lumpectomy in 1996 for a T1b N0 grade 2 invasive ductal carcinoma which was strongly estrogen and progesterone receptor positive, treated with radiation, then anastrozole, then Evista, all treatment discontinued July 2012  (2) recurrence to the lining of the left lung and liver documented by liver biopsy July 2012, showing metastatic adenocarcinoma which was estrogen receptor positive.   (3)  She has been on exemestane since early July of 2012, with chest CT scan April 2013 showing complete resolution of her lung and liver metastases  PLAN: From a breast cancer point of view, Theresa Wallace seems to be doing well, and we await  the results of today's chest x-ray. The plan is to continue exemestane indefinitely. If and when there is disease progression we will add everolimus or switch to fulvestrant.  She return in 3 months for repeat labs, chest x-ray, and a followup with Dr. Darnelle Catalan. In the meanwhile, I am also referring her back to her dermatologist, Theresa Wallace, for further evaluation and possible biopsy of the skin lesion on the left breast.  Theresa Wallace voices understanding and agreement with our plan, and will call with any changes or problems prior to her next scheduled appointment.  Theresa Wallace    02/28/2012

## 2012-02-28 NOTE — Telephone Encounter (Signed)
gv pt appt schedule for January 2014 and referral for chest xray to be done after lb on 05/29/2012. S/w [facility] @ Dr. Johnn Hai office re appt for pt ASAP. Per Corrie Dandy next available is not until 12/6. Per Josue Hector for triage nurse re sooner appt. Pt aware I will call w/dermatology apot as soon as I hear from Dr. Johnn Hai nurse.

## 2012-02-29 ENCOUNTER — Telehealth: Payer: Self-pay | Admitting: Oncology

## 2012-02-29 NOTE — Telephone Encounter (Signed)
Aurther Loft (nurse) from Dr. Johnn Hai office called back w/appt for 10/15 @ 3:10 pm to arrive @ 2:50 pm. S/w pt she is aware of d/t/location. Pt given all other appts prior to leaving 10/9.

## 2012-03-05 ENCOUNTER — Other Ambulatory Visit: Payer: Self-pay | Admitting: Dermatology

## 2012-03-20 ENCOUNTER — Other Ambulatory Visit: Payer: Self-pay | Admitting: Oncology

## 2012-03-20 DIAGNOSIS — C50919 Malignant neoplasm of unspecified site of unspecified female breast: Secondary | ICD-10-CM

## 2012-05-29 ENCOUNTER — Ambulatory Visit (HOSPITAL_COMMUNITY)
Admission: RE | Admit: 2012-05-29 | Discharge: 2012-05-29 | Disposition: A | Discharge status: Home / Self Care | Payer: Medicare Other | Source: Ambulatory Visit | Attending: Physician Assistant | Admitting: Physician Assistant

## 2012-05-29 ENCOUNTER — Other Ambulatory Visit (HOSPITAL_BASED_OUTPATIENT_CLINIC_OR_DEPARTMENT_OTHER): Payer: Medicare Other | Admitting: Lab

## 2012-05-29 DIAGNOSIS — C50919 Malignant neoplasm of unspecified site of unspecified female breast: Secondary | ICD-10-CM

## 2012-05-29 DIAGNOSIS — C800 Disseminated malignant neoplasm, unspecified: Secondary | ICD-10-CM

## 2012-05-29 DIAGNOSIS — I10 Essential (primary) hypertension: Secondary | ICD-10-CM | POA: Insufficient documentation

## 2012-05-29 LAB — COMPREHENSIVE METABOLIC PANEL (CC13)
ALT: 20 U/L (ref 0–55)
AST: 19 U/L (ref 5–34)
Albumin: 3.8 g/dL (ref 3.5–5.0)
Alkaline Phosphatase: 51 U/L (ref 40–150)
Glucose: 90 mg/dl (ref 70–99)
Potassium: 4.5 mEq/L (ref 3.5–5.1)
Sodium: 138 mEq/L (ref 136–145)
Total Bilirubin: 1.13 mg/dL (ref 0.20–1.20)
Total Protein: 6.6 g/dL (ref 6.4–8.3)

## 2012-05-29 LAB — CBC WITH DIFFERENTIAL/PLATELET
BASO%: 0.4 % (ref 0.0–2.0)
EOS%: 4.1 % (ref 0.0–7.0)
Eosinophils Absolute: 0.3 10*3/uL (ref 0.0–0.5)
LYMPH%: 26.5 % (ref 14.0–49.7)
MCHC: 34.2 g/dL (ref 31.5–36.0)
MCV: 87.9 fL (ref 79.5–101.0)
MONO%: 7.9 % (ref 0.0–14.0)
NEUT#: 4.9 10*3/uL (ref 1.5–6.5)
RBC: 4.52 10*6/uL (ref 3.70–5.45)
RDW: 13.5 % (ref 11.2–14.5)
WBC: 8.1 10*3/uL (ref 3.9–10.3)

## 2012-06-05 ENCOUNTER — Other Ambulatory Visit (HOSPITAL_COMMUNITY): Payer: Medicare Other

## 2012-06-05 ENCOUNTER — Ambulatory Visit (HOSPITAL_BASED_OUTPATIENT_CLINIC_OR_DEPARTMENT_OTHER): Payer: Medicare Other | Admitting: Oncology

## 2012-06-05 ENCOUNTER — Telehealth: Payer: Self-pay | Admitting: Oncology

## 2012-06-05 VITALS — BP 161/68 | HR 68 | Temp 97.5°F | Resp 20 | Ht 63.0 in | Wt 168.3 lb

## 2012-06-05 DIAGNOSIS — C787 Secondary malignant neoplasm of liver and intrahepatic bile duct: Secondary | ICD-10-CM

## 2012-06-05 DIAGNOSIS — Z170 Estrogen receptor positive status [ER+]: Secondary | ICD-10-CM

## 2012-06-05 DIAGNOSIS — C50919 Malignant neoplasm of unspecified site of unspecified female breast: Secondary | ICD-10-CM

## 2012-06-05 DIAGNOSIS — C7800 Secondary malignant neoplasm of unspecified lung: Secondary | ICD-10-CM

## 2012-06-05 MED ORDER — GABAPENTIN 300 MG PO CAPS: 300.0000 mg | ORAL_CAPSULE | Freq: Every evening | ORAL | Status: DC | PRN

## 2012-06-05 MED ORDER — EXEMESTANE 25 MG PO TABS: 25.0000 mg | ORAL_TABLET | Freq: Every day | ORAL | Status: DC

## 2012-06-05 NOTE — Progress Notes (Signed)
ID: Theresa Wallace   DOB: [birth year]  MR#: [id number]  JXB#:[id number]  HISTORY OF PRESENT ILLNESS: Theresa Wallace is an 79 year old [place] woman I saw briefly when she moved to this area in 2007.  She had had a left breast cancer removed at [facility] in [place] in [phone]  It was grade 2, measured 1 cm maximally and was strongly estrogen and progesterone-receptor positive.  HER-2 was not checked.  This was T1b NX invasive ductal carcinoma, stage I, and she was treated after radiation with anastrozole for several years, eventually switching to Evista primarily for cost reasons.   More recently, she presented to Dr. Kevan Ny with fatigue and a dry cough.  He auscultated dullness to percussion at the left base and obtained a chest x-ray which confirmed the presence of a left pleural effusion.   Dr. Kevan Ny scheduled Theresa Wallace for an ultrasound-guided left thoracentesis, and this was performed November 09, 2010.  Approximately 1 L of amber-colored fluid was removed.  Cytology from this procedure ([id number]) showed only reactive mesothelial cells.   The patient was then referred here and set up for a CT of the chest and a PET scan.  The CT of the chest on June 20th showed 2 slightly enlarged left axillary lymph nodes with some irregular contours but more importantly multiple pleural nodules in the left chest measuring up to 4.6 cm.  The right chest was normal.  There was no pericardial effusion.  There was only a small to moderate left pleural effusion remaining, and aside from left lower lobe collapse or consolidation, there was no evidence of lung parenchymal involvement.  There was no evidence of bone involvement and also no calcified pleural plaques and no worrisome bone involvement.  PET scan on June 27th showed the nodularity along the left pleura to be intensely hypermetabolic.  For example the large lobe nodule measuring 4.5 cm had an SUV max of 12.9.  There was no hypermetabolic  mediastinal nodal activity.  The to left axillary lymph nodes were very mildly hypermetabolic.  The left breast was clear.  There was 1 hypermetabolic lesion in the liver measuring 3.2 cm.  On June 27th, Dr. Yolanda Bonine performed fine-needle aspiration of a 7 mm abnormal-appearing lymph node in the lower left axilla.  The cytology from this procedure, however, ([name]) also was inconclusive showing no evidence of nodal tissue, mostly blood and fatty tissue. Her subsequent history is as detailed below  INTERVAL HISTORY: Theresa Wallace returns with her significant other, Theresa Wallace, for followup of her metastatic breast cancer. The interval history is unremarkable. She gets anxious before coming here, but most of the time she doesn't think about her cancer diagnosis. She enjoyed the holidays. Family is "fine".   REVIEW OF SYSTEMS: She still has postherpetic neuralgia in her left breast, where she had the shingles. It bothers her at various times, and she is not taking anything for it. She gets chills at times. She doesn't get hot flashes. She describes herself as fatigue, and sometimes it's hard for her to keep up with Theresa Wallace, she says. She does get on her exercise bike daily. She has a little bit of a dry cough sometimes, some urinary dribbling, and of course carpal tunnel issues particularly on the right. Overall however there are no new symptoms in there specifically no symptoms suggestive of cancer progression.  PAST MEDICAL HISTORY: Past Medical History  Diagnosis Date  . Hypertension   . Cancer     breast ca  .  Breast cancer metastasized to multiple sites 06/07/2011  1. Hypercholesterolemia. 2. Benign tremor. 3. Sigmoid diverticulosis. 4. Onychomycosis. 5. Irritable bowel syndrome. 6. GERD. 7. History of palpitations with Holter monitor in 2008 showing PACs. 8. History of hysterectomy with no salpingo-oophorectomy. 9. History of cholecystectomy. 10. History of bilateral knee arthroscopic  surgery. History of hemorrhoid surgery.  FAMILY HISTORY [name] died in an automobile accident.  [name] died with "bone cancer."  The patient had 3 sisters.  One had breast cancer develop in her 47s.  There is also a cousin with breast cancer but no history of ovarian cancer or other history of cancer in first-degree relatives.  GYNECOLOGIC HISTORY: She is GX, P4, first pregnancy to term at age 77.  She was on the hormone replacement after her hysterectomy but only briefly and stopped when she had the initial diagnosis of breast cancer in 1996.  SOCIAL HISTORY: She worked as a Diplomatic Services operational officer.  She has been widowed since 2005, her husband Theresa Wallace dying after a long illness involving strokes and a myocardial infarction.  She lives with Theresa Wallace who is a retired Art gallery manager.  Her daughter Theresa Wallace used to be a Diplomatic Services operational officer for Dr. Kevan Ny, and Theresa Wallace's husband, Theresa Wallace, is a physician's assistant with Dr. Cleophas Dunker.  The patient has 12 grandchildren and 4 great-grandchildren with 1 more on the way.   ADVANCED DIRECTIVES: In place  HEALTH MAINTENANCE: History  Substance Use Topics  . Smoking status: Former Smoker    Quit date: 05/22/1968  . Smokeless tobacco: Never Used  . Alcohol Use: Yes     Colonoscopy:  PAP:  Bone density:  Lipid panel:  Allergies  Allergen Reactions  . Ciprofloxacin   . Epinephrine   . Tamoxifen     Current Outpatient Prescriptions  Medication Sig Dispense Refill  . aspirin 81 MG tablet Take 81 mg by mouth daily.      . Calcium Carbonate-Vitamin D (CALCIUM-VITAMIN D) 500-200 MG-UNIT per tablet Take 1 tablet by mouth 2 (two) times daily with a meal.      . cholecalciferol (VITAMIN D) 400 UNITS TABS Take 2,000 Units by mouth daily.      Marland Kitchen exemestane (AROMASIN) 25 MG tablet TAKE 1 TABLET BY MOUTH ONCE DAILY  90 tablet  PRN  . FINACEA 15 % cream       . Glucosamine HCl-MSM (GLUCOSAMINE-MSM PO) Take 1 tablet by mouth daily.      Marland Kitchen  HYDROcodone-acetaminophen (VICODIN) 5-500 MG per tablet       . levothyroxine (SYNTHROID, LEVOTHROID) 25 MCG tablet Take 25 mcg by mouth daily.      Marland Kitchen LORazepam (ATIVAN) 0.5 MG tablet TAKE 1/2 TO ONE TABLET BY MOUTH TWICE DAILY PRN ANXIETY  15 tablet  0  . Multiple Vitamin (MULTIVITAMIN) tablet Take 1 tablet by mouth daily.      . niacin (NIASPAN) 500 MG CR tablet Take 500 mg by mouth at bedtime.      . propranolol (INDERAL) 60 MG tablet Take 60 mg by mouth daily.        OBJECTIVE:   Filed Vitals:   06/05/12 1414  BP: 161/68  Pulse: 68  Temp: 97.5 F (36.4 C)  Resp: 20     Body mass index is 29.81 kg/(m^2).    ECOG FS: 1  Elderly white woman who appears well  Sclerae unicteric; slight left ptosis, stable Oropharynx clear No cervical or supraclavicular adenopathy Lungs no rales or rhonchi, good excursion bilaterally Heart regular  rate and rhythm Abd soft, nontender with positive bowel sounds MSK no focal spinal tenderness, no peripheral edema Neuro: nonfocal, well oriented, slightly anxious affect Breasts: The right breast is unremarkable. The left breast is status post lumpectomy.  There is a scar in the inferior aspect of the breast where she had the biopsy in October showing seborrheic keratosis. There is telangiectasia in the left axilla. There is no evidence of recurrence in the breast and the left axilla is otherwise clear.  LAB RESULTS: Lab Results  Component Value Date   WBC 8.1 05/29/2012   NEUTROABS 4.9 05/29/2012   HGB 13.6 05/29/2012   HCT 39.7 05/29/2012   MCV 87.9 05/29/2012   PLT 173 05/29/2012      Chemistry      Component Value Date/Time   NA 138 05/29/2012 1119   NA 133* 11/13/2011 0955   NA 142 08/30/2011 1253   K 4.5 05/29/2012 1119   K 4.4 11/13/2011 0955   K 4.2 08/30/2011 1253   CL 102 05/29/2012 1119   CL 98 11/13/2011 0955   CL 101 08/30/2011 1253   CO2 27 05/29/2012 1119   CO2 29 11/13/2011 0955   CO2 27 08/30/2011 1253   BUN 15.0 05/29/2012 1119   BUN 20 11/13/2011  0955   BUN 17 08/30/2011 1253   CREATININE 1.0 05/29/2012 1119   CREATININE 0.91 11/13/2011 0955   CREATININE 1.0 08/30/2011 1253      Component Value Date/Time   CALCIUM 10.1 05/29/2012 1119   CALCIUM 8.9 11/13/2011 0955   CALCIUM 9.1 08/30/2011 1253   ALKPHOS 51 05/29/2012 1119   ALKPHOS 49 11/13/2011 0955   ALKPHOS 45 08/30/2011 1253   AST 19 05/29/2012 1119   AST 17 11/13/2011 0955   AST 25 08/30/2011 1253   ALT 20 05/29/2012 1119   ALT 30 11/13/2011 0955   BILITOT 1.13 05/29/2012 1119   BILITOT 1.1 11/13/2011 0955   BILITOT 1.20 08/30/2011 1253       Lab Results  Component Value Date   LABCA2 29 05/29/2012    STUDIES: Dg Chest 2 View  05/29/2012  *RADIOLOGY REPORT*  Clinical Data: Metastatic breast cancer, hypertension.  CHEST - 2 VIEW  Comparison: February 28, 2012.  Findings: Cardiomediastinal silhouette appears normal.  No acute pulmonary disease is noted.  Bony thorax is intact.  IMPRESSION: No acute cardiopulmonary abnormality seen.   Original Report Authenticated By: Lupita Raider.,  M.D.    Dg Bone Density  10/23/2011  The Bone Mineral Densitometry  Report was read as within normal limits   ASSESSMENT: 78 y.o. [place] woman:   (1) Status post left lumpectomy in 1996 for a T1b N0 grade 2 invasive ductal carcinoma which was strongly estrogen and progesterone receptor positive, treated with radiation, then anastrozole, then Evista, all treatment discontinued July 2012  (2) recurrence to the lining of the left lung and liver documented by liver biopsy July 2012, showing metastatic adenocarcinoma which was estrogen receptor positive.   (3)  She has been on exemestane since early July of 2012, with chest CT scan April 2013 showing complete resolution of her lung and liver metastases  PLAN: Chauncy Lean is doing her a markedly well. I can't get her any better. The plan is to continue the exemestane indefinitely, with visits every 3 months, and we will do chest x-rays every 3 months unless there are  new symptoms to evaluate. I think she might benefit from gabapentin for her postherpetic neuralgia, but I am  concerned this may confuse her or make her very tired, so I suggested she only take it at bedtime. Otherwise I making no changes in her medication. She knows to call for any problems that may develop before the next visit.  Elmer Merwin C    06/05/2012

## 2012-06-05 NOTE — Telephone Encounter (Signed)
gv pt appt schedule for April and referral for cxr 4/2 @ [facility]. D/t's per pt.

## 2012-08-21 ENCOUNTER — Other Ambulatory Visit (HOSPITAL_BASED_OUTPATIENT_CLINIC_OR_DEPARTMENT_OTHER): Payer: Medicare Other | Admitting: Lab

## 2012-08-21 ENCOUNTER — Ambulatory Visit (HOSPITAL_COMMUNITY)
Admission: RE | Admit: 2012-08-21 | Discharge: 2012-08-21 | Disposition: A | Discharge status: Home / Self Care | Payer: Medicare Other | Source: Ambulatory Visit | Attending: Oncology | Admitting: Oncology

## 2012-08-21 DIAGNOSIS — C50919 Malignant neoplasm of unspecified site of unspecified female breast: Secondary | ICD-10-CM

## 2012-08-21 DIAGNOSIS — C787 Secondary malignant neoplasm of liver and intrahepatic bile duct: Secondary | ICD-10-CM

## 2012-08-21 DIAGNOSIS — J9819 Other pulmonary collapse: Secondary | ICD-10-CM | POA: Insufficient documentation

## 2012-08-21 DIAGNOSIS — M898X9 Other specified disorders of bone, unspecified site: Secondary | ICD-10-CM | POA: Insufficient documentation

## 2012-08-21 DIAGNOSIS — I700 Atherosclerosis of aorta: Secondary | ICD-10-CM | POA: Insufficient documentation

## 2012-08-21 DIAGNOSIS — C7800 Secondary malignant neoplasm of unspecified lung: Secondary | ICD-10-CM | POA: Insufficient documentation

## 2012-08-21 LAB — COMPREHENSIVE METABOLIC PANEL (CC13)
AST: 17 U/L (ref 5–34)
Albumin: 3.6 g/dL (ref 3.5–5.0)
Alkaline Phosphatase: 52 U/L (ref 40–150)
Calcium: 9.5 mg/dL (ref 8.4–10.4)
Chloride: 105 mEq/L (ref 98–107)
Potassium: 4.3 mEq/L (ref 3.5–5.1)
Sodium: 140 mEq/L (ref 136–145)
Total Protein: 6.5 g/dL (ref 6.4–8.3)

## 2012-08-21 LAB — CBC WITH DIFFERENTIAL/PLATELET
Basophils Absolute: 0 10*3/uL (ref 0.0–0.1)
EOS%: 5.1 % (ref 0.0–7.0)
Eosinophils Absolute: 0.4 10*3/uL (ref 0.0–0.5)
HGB: 13.1 g/dL (ref 11.6–15.9)
MCH: 29.2 pg (ref 25.1–34.0)
MCV: 89.1 fL (ref 79.5–101.0)
MONO%: 7.8 % (ref 0.0–14.0)
NEUT#: 5 10*3/uL (ref 1.5–6.5)
RBC: 4.48 10*6/uL (ref 3.70–5.45)
RDW: 13.4 % (ref 11.2–14.5)
lymph#: 2.3 10*3/uL (ref 0.9–3.3)

## 2012-08-21 IMAGING — CT NM PET TUM IMG INITIAL (PI) SKULL BASE T - THIGH
1 of 6 series · 1 of 25 positions shown · IV contrast (350 OM)
Comparison: Chest CT 11/09/2010.

CLINICAL DATA: Restaging subsequent therapy for breast cancer.
Complex left pleural effusion with indeterminate cytology on
thoracentesis.

NUCLEAR MEDICINE FDG PET CT TUMOR SUBSEQUENT IMAGING- (SKULL BASE
THROUGH THIGHS)
TECHNIQUE: 18.1 mCi F-18 FDG was injected intravenously via the
right wrist.  Full-ring PET imaging was performed from the skull
base through the mid-thighs 65  minutes after injection.  CT data
was obtained and used for attenuation correction and anatomic
localization only.  (This was not acquired as a diagnostic CT
examination.)
Fasting Blood Glucose:  120

[Series 2: ct images · axial · 3.8mm · 0.98mm/px · 1 of 223 slices shown]
[im 223/223  brain]
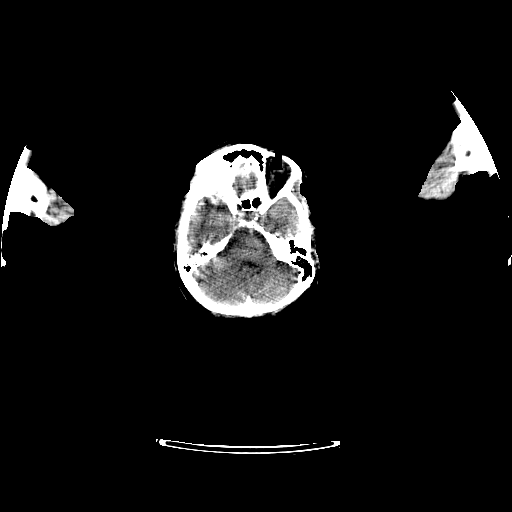

[1 of 25 positions shown; findings below may reference images not displayed]

FINDINGS: There is extensive hypermetabolic nodular pleural
activity in the left chest.  For example, there is anterior pleural
based nodularity measuring 4.5 cm on image 64 which has an SUV max
of 12.9.  More inferiorly, there is a 3.1 cm lesion on image 73
which has an SUV max of 14.1.  The left pleural effusion has
enlarged compared with the recent CT.  There is progressive partial
left lung atelectasis.  No definite abnormal pulmonary metabolic
activity is demonstrated.

There is no abnormal activity associated with the right pleural
space or the pericardium.  Pleural disease on the left abuts the
mediastinum.  There is no definite hypermetabolic mediastinal nodal
activity. There are two left axillary lymph nodes which are mildly
hypermetabolic.  No abnormal activity is seen within the left
breast.  There is no abnormal activity within the neck.
Specifically, the right thyroid nodule demonstrated on recent CT
shows no abnormal activity.

There is a hypermetabolic lesion posteriorly in the right hepatic
lobe.  This measures 3.2 x 1.9 cm on CT image 127 and has an SUV
max of 10.3.  No other definite liver lesions are seen.  There is
no evidence of metastatic disease to the adrenal glands or bones.
IMPRESSION: 1.  The multifocal nodularity in the left pleural space is
intensely hypermetabolic, most consistent with metastatic breast
cancer.  Left pleural effusion and left lung partial collapse have
worsened.
2.  Solitary hepatic metastasis posteriorly in the right lobe.
3.  Hypermetabolic metastases to two left axillary lymph nodes.
4.  No other evidence of metastatic disease.

## 2012-08-28 ENCOUNTER — Ambulatory Visit (HOSPITAL_BASED_OUTPATIENT_CLINIC_OR_DEPARTMENT_OTHER): Payer: Medicare Other | Admitting: Physician Assistant

## 2012-08-28 ENCOUNTER — Encounter: Payer: Self-pay | Admitting: Physician Assistant

## 2012-08-28 ENCOUNTER — Telehealth: Payer: Self-pay | Admitting: *Deleted

## 2012-08-28 VITALS — BP 167/80 | HR 59 | Temp 98.6°F | Resp 20 | Ht 63.0 in | Wt 172.6 lb

## 2012-08-28 DIAGNOSIS — C7800 Secondary malignant neoplasm of unspecified lung: Secondary | ICD-10-CM

## 2012-08-28 DIAGNOSIS — C787 Secondary malignant neoplasm of liver and intrahepatic bile duct: Secondary | ICD-10-CM

## 2012-08-28 DIAGNOSIS — C50912 Malignant neoplasm of unspecified site of left female breast: Secondary | ICD-10-CM

## 2012-08-28 DIAGNOSIS — C50919 Malignant neoplasm of unspecified site of unspecified female breast: Secondary | ICD-10-CM

## 2012-08-28 MED ORDER — EXEMESTANE 25 MG PO TABS: ORAL_TABLET | ORAL | Status: DC

## 2012-08-28 NOTE — Progress Notes (Signed)
ID: Theresa Wallace   DOB: 07/19/1923  MR#: [id number]  WUJ#:[id number]  HISTORY OF PRESENT ILLNESS: Theresa Wallace is an 79 year old [place] woman I saw briefly when she moved to this area in 2007.  She had had a left breast cancer removed at [facility] in [place] in [phone]  It was grade 2, measured 1 cm maximally and was strongly estrogen and progesterone-receptor positive.  HER-2 was not checked.  This was T1b NX invasive ductal carcinoma, stage I, and she was treated after radiation with anastrozole for several years, eventually switching to Evista primarily for cost reasons.   More recently, she presented to Dr. Kevan Ny with fatigue and a dry cough.  He auscultated dullness to percussion at the left base and obtained a chest x-ray which confirmed the presence of a left pleural effusion.   Dr. Kevan Ny scheduled Theresa Wallace for an ultrasound-guided left thoracentesis, and this was performed November 09, 2010.  Approximately 1 L of amber-colored fluid was removed.  Cytology from this procedure ([id number]) showed only reactive mesothelial cells.   The patient was then referred here and set up for a CT of the chest and a PET scan.  The CT of the chest on June 20th showed 2 slightly enlarged left axillary lymph nodes with some irregular contours but more importantly multiple pleural nodules in the left chest measuring up to 4.6 cm.  The right chest was normal.  There was no pericardial effusion.  There was only a small to moderate left pleural effusion remaining, and aside from left lower lobe collapse or consolidation, there was no evidence of lung parenchymal involvement.  There was no evidence of bone involvement and also no calcified pleural plaques and no worrisome bone involvement.  PET scan on June 27th showed the nodularity along the left pleura to be intensely hypermetabolic.  For example the large lobe nodule measuring 4.5 cm had an SUV max of 12.9.  There was no hypermetabolic  mediastinal nodal activity.  The to left axillary lymph nodes were very mildly hypermetabolic.  The left breast was clear.  There was 1 hypermetabolic lesion in the liver measuring 3.2 cm.  On June 27th, Dr. Yolanda Bonine performed fine-needle aspiration of a 7 mm abnormal-appearing lymph node in the lower left axilla.  The cytology from this procedure, however, ([name]) also was inconclusive showing no evidence of nodal tissue, mostly blood and fatty tissue. Her subsequent history is as detailed below  INTERVAL HISTORY: Theresa Wallace returns with her significant other, Theresa Wallace, for followup of her metastatic breast cancer. She continues on exemestane with good tolerance. She has no significant hot flashes. She has no increased joint pain. She does feel like her cough has increased slightly, but she's had no increased shortness of breath. She does feel fatigued. She admits that she has not been exercising regularly.  REVIEW OF SYSTEMS: Theresa Wallace tells me she recently had a "stomach virus" but this resolved quickly, and she's had no additional illnesses and denies any fevers or chills. She's had no skin changes, abnormal bruising, or abnormal bleeding. She's eating well. She has occasional queasiness, but no emesis, and it seems to help when she eats. She's had no change in bowel habits. She's had no chest pain or palpitations. No abnormal headaches or dizziness. No peripheral swelling.  A detailed review of systems is otherwise stable and noncontributory.   PAST MEDICAL HISTORY: Past Medical History  Diagnosis Date  . Hypertension   . Cancer     breast  ca  . Breast cancer metastasized to multiple sites 06/07/2011  1. Hypercholesterolemia. 2. Benign tremor. 3. Sigmoid diverticulosis. 4. Onychomycosis. 5. Irritable bowel syndrome. 6. GERD. 7. History of palpitations with Holter monitor in 2008 showing PACs. 8. History of hysterectomy with no salpingo-oophorectomy. 9. History of cholecystectomy. 10. History  of bilateral knee arthroscopic surgery. History of hemorrhoid surgery.  FAMILY HISTORY [name] died in an automobile accident.  [name] died with "bone cancer."  The patient had 3 sisters.  One had breast cancer develop in her 64s.  There is also a cousin with breast cancer but no history of ovarian cancer or other history of cancer in first-degree relatives.  GYNECOLOGIC HISTORY: She is GX, P4, first pregnancy to term at age 5.  She was on the hormone replacement after her hysterectomy but only briefly and stopped when she had the initial diagnosis of breast cancer in 1996.  SOCIAL HISTORY: She worked as a Diplomatic Services operational officer.  She has been widowed since 2005, her husband Theresa Wallace dying after a long illness involving strokes and a myocardial infarction.  She lives with Theresa Wallace who is a retired Art gallery manager.  Her daughter Theresa Wallace used to be a Diplomatic Services operational officer for Dr. Kevan Ny, and [name]'s husband, Theresa Wallace, is a physician's assistant with Dr. Cleophas Dunker.  The patient has 12 grandchildren and 4 great-grandchildren with 1 more on the way.   ADVANCED DIRECTIVES: In place  HEALTH MAINTENANCE: History  Substance Use Topics  . Smoking status: Former Smoker    Quit date: 05/22/1968  . Smokeless tobacco: Never Used  . Alcohol Use: Yes     Colonoscopy:  PAP:  Bone density:  Lipid panel:  Allergies  Allergen Reactions  . Ciprofloxacin   . Epinephrine   . Tamoxifen     Current Outpatient Prescriptions  Medication Sig Dispense Refill  . aspirin 81 MG tablet Take 81 mg by mouth daily.      . Calcium Carbonate-Vitamin D (CALCIUM-VITAMIN D) 500-200 MG-UNIT per tablet Take 1 tablet by mouth 2 (two) times daily with a meal.      . cholecalciferol (VITAMIN D) 400 UNITS TABS Take 2,000 Units by mouth daily.      Marland Kitchen exemestane (AROMASIN) 25 MG tablet 1 tab by mouth daily  90 tablet  3  . FINACEA 15 % cream       . Glucosamine HCl-MSM (GLUCOSAMINE-MSM PO) Take 1 tablet by mouth daily.       Marland Kitchen levothyroxine (SYNTHROID, LEVOTHROID) 25 MCG tablet Take 25 mcg by mouth daily.      Marland Kitchen LORazepam (ATIVAN) 0.5 MG tablet TAKE 1/2 TO ONE TABLET BY MOUTH TWICE DAILY PRN ANXIETY  15 tablet  0  . Multiple Vitamin (MULTIVITAMIN) tablet Take 1 tablet by mouth daily.      . niacin (NIASPAN) 500 MG CR tablet Take 500 mg by mouth at bedtime.      . propranolol (INDERAL) 60 MG tablet Take 60 mg by mouth daily.       No current facility-administered medications for this visit.    OBJECTIVE:   Filed Vitals:   08/28/12 1101  BP: 167/80  Pulse: 59  Temp: 98.6 F (37 C)  Resp: 20     Body mass index is 30.58 kg/(m^2).    ECOG FS: 1 Filed Weights   08/28/12 1101  Weight: 172 lb 9.6 oz (78.291 kg)    Elderly white woman who appears well  Sclerae unicteric; slight left ptosis, stable Oropharynx clear No cervical  or supraclavicular adenopathy Lungs clear to auscultation with no rales or rhonchi, good excursion bilaterally Heart regular rate and rhythm Abd soft, nontender with positive bowel sounds MSK no focal spinal tenderness, no peripheral edema Neuro: nonfocal, well oriented, slightly anxious affect Breasts: The right breast is unremarkable. The left breast is status post lumpectomy.  There is no evidence of recurrence in the breast.  Axillae are benign bilaterally for palpable adenopathy noted.  LAB RESULTS: Lab Results  Component Value Date   WBC 8.4 08/21/2012   NEUTROABS 5.0 08/21/2012   HGB 13.1 08/21/2012   HCT 39.9 08/21/2012   MCV 89.1 08/21/2012   PLT 165 08/21/2012      Chemistry      Component Value Date/Time   NA 140 08/21/2012 1126   NA 133* 11/13/2011 0955   NA 142 08/30/2011 1253   K 4.3 08/21/2012 1126   K 4.4 11/13/2011 0955   K 4.2 08/30/2011 1253   CL 105 08/21/2012 1126   CL 98 11/13/2011 0955   CL 101 08/30/2011 1253   CO2 27 08/21/2012 1126   CO2 29 11/13/2011 0955   CO2 27 08/30/2011 1253   BUN 23.8 08/21/2012 1126   BUN 20 11/13/2011 0955   BUN 17 08/30/2011 1253    CREATININE 1.0 08/21/2012 1126   CREATININE 0.91 11/13/2011 0955   CREATININE 1.0 08/30/2011 1253      Component Value Date/Time   CALCIUM 9.5 08/21/2012 1126   CALCIUM 8.9 11/13/2011 0955   CALCIUM 9.1 08/30/2011 1253   ALKPHOS 52 08/21/2012 1126   ALKPHOS 49 11/13/2011 0955   ALKPHOS 45 08/30/2011 1253   AST 17 08/21/2012 1126   AST 17 11/13/2011 0955   AST 25 08/30/2011 1253   ALT 19 08/21/2012 1126   ALT 30 11/13/2011 0955   BILITOT 0.87 08/21/2012 1126   BILITOT 1.1 11/13/2011 0955   BILITOT 1.20 08/30/2011 1253       Lab Results  Component Value Date   LABCA2 29 05/29/2012    STUDIES:  Dg Chest 2 View  08/21/2012  *RADIOLOGY REPORT*  Clinical Data: Breast cancer metastatic to lung  CHEST - 2 VIEW  Comparison: 05/29/2012  Findings: Normal heart size, mediastinal contours, and pulmonary vascularity. Atherosclerotic calcification of a minimally tortuous thoracic aorta. Linear bibasilar atelectasis slightly increased since previous study. Remaining lungs clear. No pleural effusion or pneumothorax. Large inferior left acromial spur, which may predispose the patient to rotator cuff pathology. Diffuse osseous demineralization with scattered endplate spur formation thoracic spine.  IMPRESSION: Slightly increased bibasilar atelectasis.   Original Report Authenticated By: Ulyses Southward, M.D.     Dg Bone Density  10/23/2011  The Bone Mineral Densitometry  Report was read as within normal limits   ASSESSMENT: 79 y.o. [place] woman:   (1) Status post left lumpectomy in 1996 for a T1b N0 grade 2 invasive ductal carcinoma which was strongly estrogen and progesterone receptor positive, treated with radiation, then anastrozole, then Evista, all treatment discontinued July 2012  (2) recurrence to the lining of the left lung and liver documented by liver biopsy July 2012, showing metastatic adenocarcinoma which was estrogen receptor positive.   (3)  She has been on exemestane since early July of 2012, with chest  CT scan April 2013 showing complete resolution of her lung and liver metastases  PLAN: Theresa Wallace appears to be doing very well with regards to her metastatic breast carcinoma. I have reviewed her recent chest x-ray with Dr. Darnelle Catalan, and use a  slight increase in atelectasis, we will repeat her chest x-ray and see her in 2 months rather than 3. In the meanwhile, Theresa Wallace knows to call us sooner if she is having any increased cough, phlegm production, pleurisy, or shortness of breath.  This plan was reviewed in detail with Theresa Wallace today, and she will call with any changes or problems prior to her next appointment. In the meanwhile she'll continue on her exemestane which I have refilled.   Laiza Veenstra    08/28/2012

## 2012-08-28 NOTE — Telephone Encounter (Signed)
appts made and printed 

## 2012-09-02 ENCOUNTER — Other Ambulatory Visit: Payer: Self-pay | Admitting: Oncology

## 2012-09-18 ENCOUNTER — Other Ambulatory Visit: Payer: Self-pay | Admitting: Orthopaedic Surgery

## 2012-09-18 DIAGNOSIS — Z859 Personal history of malignant neoplasm, unspecified: Secondary | ICD-10-CM

## 2012-09-18 DIAGNOSIS — M545 Low back pain: Secondary | ICD-10-CM

## 2012-09-20 ENCOUNTER — Ambulatory Visit
Admission: RE | Admit: 2012-09-20 | Discharge: 2012-09-20 | Disposition: A | Discharge status: Home / Self Care | Payer: Medicare Other | Source: Ambulatory Visit | Attending: Orthopaedic Surgery | Admitting: Orthopaedic Surgery

## 2012-09-20 DIAGNOSIS — Z859 Personal history of malignant neoplasm, unspecified: Secondary | ICD-10-CM

## 2012-09-20 DIAGNOSIS — M545 Low back pain: Secondary | ICD-10-CM

## 2012-10-09 ENCOUNTER — Other Ambulatory Visit: Payer: Self-pay | Admitting: Dermatology

## 2012-10-24 ENCOUNTER — Other Ambulatory Visit (HOSPITAL_BASED_OUTPATIENT_CLINIC_OR_DEPARTMENT_OTHER): Payer: Medicare Other | Admitting: Lab

## 2012-10-24 ENCOUNTER — Ambulatory Visit (HOSPITAL_COMMUNITY)
Admission: RE | Admit: 2012-10-24 | Discharge: 2012-10-24 | Disposition: A | Discharge status: Home / Self Care | Payer: Medicare Other | Source: Ambulatory Visit | Attending: Physician Assistant | Admitting: Physician Assistant

## 2012-10-24 DIAGNOSIS — C50919 Malignant neoplasm of unspecified site of unspecified female breast: Secondary | ICD-10-CM

## 2012-10-24 DIAGNOSIS — C50912 Malignant neoplasm of unspecified site of left female breast: Secondary | ICD-10-CM

## 2012-10-24 DIAGNOSIS — C7800 Secondary malignant neoplasm of unspecified lung: Secondary | ICD-10-CM

## 2012-10-24 DIAGNOSIS — Z87891 Personal history of nicotine dependence: Secondary | ICD-10-CM | POA: Insufficient documentation

## 2012-10-24 DIAGNOSIS — I10 Essential (primary) hypertension: Secondary | ICD-10-CM | POA: Insufficient documentation

## 2012-10-24 DIAGNOSIS — C787 Secondary malignant neoplasm of liver and intrahepatic bile duct: Secondary | ICD-10-CM

## 2012-10-24 LAB — CBC WITH DIFFERENTIAL/PLATELET
BASO%: 0.4 % (ref 0.0–2.0)
EOS%: 5.1 % (ref 0.0–7.0)
HCT: 39.5 % (ref 34.8–46.6)
MCHC: 33.7 g/dL (ref 31.5–36.0)
MONO#: 0.7 10*3/uL (ref 0.1–0.9)
NEUT%: 60.9 % (ref 38.4–76.8)
RBC: 4.47 10*6/uL (ref 3.70–5.45)
RDW: 13.2 % (ref 11.2–14.5)
WBC: 7.7 10*3/uL (ref 3.9–10.3)
lymph#: 1.9 10*3/uL (ref 0.9–3.3)

## 2012-10-24 LAB — COMPREHENSIVE METABOLIC PANEL (CC13)
ALT: 18 U/L (ref 0–55)
AST: 17 U/L (ref 5–34)
Albumin: 3.9 g/dL (ref 3.5–5.0)
CO2: 26 mEq/L (ref 22–29)
Calcium: 9.6 mg/dL (ref 8.4–10.4)
Chloride: 104 mEq/L (ref 98–107)
Potassium: 4.7 mEq/L (ref 3.5–5.1)
Sodium: 138 mEq/L (ref 136–145)
Total Protein: 6.8 g/dL (ref 6.4–8.3)

## 2012-10-31 ENCOUNTER — Telehealth: Payer: Self-pay | Admitting: *Deleted

## 2012-10-31 ENCOUNTER — Ambulatory Visit (HOSPITAL_BASED_OUTPATIENT_CLINIC_OR_DEPARTMENT_OTHER): Payer: Medicare Other | Admitting: Oncology

## 2012-10-31 VITALS — BP 159/84 | HR 59 | Temp 97.9°F | Resp 20 | Ht 63.0 in | Wt 171.9 lb

## 2012-10-31 DIAGNOSIS — C50919 Malignant neoplasm of unspecified site of unspecified female breast: Secondary | ICD-10-CM

## 2012-10-31 DIAGNOSIS — C7800 Secondary malignant neoplasm of unspecified lung: Secondary | ICD-10-CM

## 2012-10-31 DIAGNOSIS — Z808 Family history of malignant neoplasm of other organs or systems: Secondary | ICD-10-CM

## 2012-10-31 DIAGNOSIS — C50912 Malignant neoplasm of unspecified site of left female breast: Secondary | ICD-10-CM

## 2012-10-31 DIAGNOSIS — C787 Secondary malignant neoplasm of liver and intrahepatic bile duct: Secondary | ICD-10-CM

## 2012-10-31 NOTE — Addendum Note (Signed)
Addended by: Billey Co on: 10/31/2012 05:32 PM   Modules accepted: Orders, Medications

## 2012-10-31 NOTE — Progress Notes (Signed)
ID: [name]   DOB: May 25, 1923  MR#: [id number]  WUJ#:[id number]  PCP: Pearla Dubonnet, MD GYN: SU:  OTHER MD:  HISTORY OF PRESENT ILLNESS: Theresa Wallace is an 79 y.o.  Theresa Wallace woman I saw briefly when she moved to this area in 2007.  She had had a left breast cancer removed at [facility] in [place] in [phone]  It was grade 2, measured 1 cm maximally and was strongly estrogen and progesterone-receptor positive.  HER-2 was not checked.  This was T1b NX invasive ductal carcinoma, stage I, and she was treated after radiation with anastrozole for several years, eventually switching to Evista primarily for cost reasons.   More recently, she presented to Dr. Kevan Ny with fatigue and a dry cough.  He auscultated dullness to percussion at the left base and obtained a chest x-ray which confirmed the presence of a left pleural effusion.   Dr. Kevan Ny scheduled Theresa Wallace for an ultrasound-guided left thoracentesis, and this was performed November 09, 2010.  Approximately 1 L of amber-colored fluid was removed.  Cytology from this procedure ([id number]) showed only reactive mesothelial cells.   The patient was then referred here and set up for a CT of the chest and a PET scan.  The CT of the chest on June 20th showed 2 slightly enlarged left axillary lymph nodes with some irregular contours but more importantly multiple pleural nodules in the left chest measuring up to 4.6 cm.  The right chest was normal.  There was no pericardial effusion.  There was only a small to moderate left pleural effusion remaining, and aside from left lower lobe collapse or consolidation, there was no evidence of lung parenchymal involvement.  There was no evidence of bone involvement and also no calcified pleural plaques and no worrisome bone involvement.  PET scan on June 27th showed the nodularity along the left pleura to be intensely hypermetabolic.  For example the large lobe nodule measuring 4.5 cm had  an SUV max of 12.9.  There was no hypermetabolic mediastinal nodal activity.  The to left axillary lymph nodes were very mildly hypermetabolic.  The left breast was clear.  There was 1 hypermetabolic lesion in the liver measuring 3.2 cm.  On June 27th, Dr. Yolanda Wallace performed fine-needle aspiration of a 7 mm abnormal-appearing lymph node in the lower left axilla.  The cytology from this procedure, however, ([name]) also was inconclusive showing no evidence of nodal tissue, mostly blood and fatty tissue. Her subsequent history is as detailed below  INTERVAL HISTORY: Theresa Wallace returns with her significant other, Theresa Wallace, for followup of her metastatic breast cancer. The interval history is generally unremarkable. She tells me she has been found to be hypertensive and is now taking medication for that. Her medication list was updated.  REVIEW OF SYSTEMS: Theresa Wallace is tolerating the exemestane with no side effects that she is aware of. Particular she is having no hot flashes. She has some left eye ptosis and she tells me she underwent bilateral blepharoplasty about 20 years ago and she would never want to do that again. She describes herself as mildly fatigued. Her hearing aid does not work well. Sometimes her dentures don't fit well. She can have a hoarse voice. Sometimes she feels like she is having palpitations. She has problems with stress incontinence. She feels anxious. Otherwise a detailed review of systems today was noncontributory and in particular she denies any cough, phlegm production, pleurisy, hemoptysis, or progressive shortness of breath  PAST MEDICAL  HISTORY: Past Medical History  Diagnosis Date  . Hypertension   . Cancer     breast ca  . Breast cancer metastasized to multiple sites 06/07/2011  1. Hypercholesterolemia. 2. Benign tremor. 3. Sigmoid diverticulosis. 4. Onychomycosis. 5. Irritable bowel syndrome. 6. GERD. 7. History of palpitations with Holter monitor in 2008 showing  PACs. 8. History of hysterectomy with no salpingo-oophorectomy. 9. History of cholecystectomy. 10. History of bilateral knee arthroscopic surgery. History of hemorrhoid surgery.  FAMILY HISTORY [name] died in an automobile accident.  [name] died with "bone cancer."  The patient had 3 sisters.  One had breast cancer develop in her 63s.  There is also a cousin with breast cancer but no history of ovarian cancer or other history of cancer in first-degree relatives.  GYNECOLOGIC HISTORY: She is GX, P4, first pregnancy to term at age 16.  She was on the hormone replacement after her hysterectomy but only briefly and stopped when she had the initial diagnosis of breast cancer in 1996.  SOCIAL HISTORY: She worked as a Diplomatic Services operational officer.  She has been widowed since 2005, her husband Theresa Wallace dying after a long illness involving strokes and a myocardial infarction.  She lives with Theresa Wallace who is a retired Art gallery manager.  Her daughter Theresa Wallace used to be a Diplomatic Services operational officer for Dr. Kevan Ny, and Theresa Wallace's husband, Theresa Wallace, is a physician's assistant with Dr. Cleophas Dunker.  The patient has 12 grandchildren and 4 great-grandchildren with 1 more on the way.   ADVANCED DIRECTIVES: In place  HEALTH MAINTENANCE: History  Substance Use Topics  . Smoking status: Former Smoker    Quit date: 05/22/1968  . Smokeless tobacco: Never Used  . Alcohol Use: Yes     Colonoscopy:  PAP:  Bone density:  Lipid panel:  Allergies  Allergen Reactions  . Ciprofloxacin   . Epinephrine   . Tamoxifen     Current Outpatient Prescriptions  Medication Sig Dispense Refill  . aspirin 81 MG tablet Take 81 mg by mouth daily.      . Calcium Carbonate-Vitamin D (CALCIUM-VITAMIN D) 500-200 MG-UNIT per tablet Take 1 tablet by mouth 2 (two) times daily with a meal.      . cholecalciferol (VITAMIN D) 400 UNITS TABS Take 2,000 Units by mouth daily.      Marland Kitchen exemestane (AROMASIN) 25 MG tablet 1 tab by mouth daily  90  tablet  3  . FINACEA 15 % cream       . Glucosamine HCl-MSM (GLUCOSAMINE-MSM PO) Take 1 tablet by mouth daily.      Marland Kitchen levothyroxine (SYNTHROID, LEVOTHROID) 25 MCG tablet Take 25 mcg by mouth daily.      Marland Kitchen LORazepam (ATIVAN) 0.5 MG tablet TAKE 1/2 TO ONE TABLET BY MOUTH TWICE DAILY PRN ANXIETY  15 tablet  0  . Multiple Vitamin (MULTIVITAMIN) tablet Take 1 tablet by mouth daily.      . niacin (NIASPAN) 500 MG CR tablet Take 500 mg by mouth at bedtime.      . propranolol (INDERAL) 60 MG tablet Take 60 mg by mouth daily.       No current facility-administered medications for this visit.    Filed Vitals:   10/31/12 1108  BP: 159/84  Pulse: 59  Temp: 97.9 F (36.6 C)  Resp: 20     Body mass index is 30.46 kg/(m^2).    ECOG FS: 1 Filed Weights   10/31/12 1108  Weight: 171 lb 14.4 oz (77.973 kg)   PHYSICAL EXAM:  Elderly white woman in no acute distress  Sclerae unicteric; moderate left ptosis, stable Oropharynx clear No cervical or supraclavicular adenopathy Lungs clear to auscultation, good excursion bilaterally Heart regular rate and rhythm Abd soft, nontender with positive bowel sounds MSK no focal spinal tenderness, no peripheral edema Neuro: nonfocal, well oriented, pleasant affect Breasts: The right breast is unremarkable. The left breast is status post lumpectomy.  There is no evidence of local recurrence. There is minimal erythema on the underside of both breasts consistent with irritation from the bra. It does not look candidal. The left axilla is benign  LAB RESULTS: Lab Results  Component Value Date   WBC 7.7 10/24/2012   NEUTROABS 4.7 10/24/2012   HGB 13.3 10/24/2012   HCT 39.5 10/24/2012   MCV 88.4 10/24/2012   PLT 177 10/24/2012      Chemistry      Component Value Date/Time   NA 138 10/24/2012 1014   NA 133* 11/13/2011 0955   NA 142 08/30/2011 1253   K 4.7 10/24/2012 1014   K 4.4 11/13/2011 0955   K 4.2 08/30/2011 1253   CL 104 10/24/2012 1014   CL 98 11/13/2011 0955   CL  101 08/30/2011 1253   CO2 26 10/24/2012 1014   CO2 29 11/13/2011 0955   CO2 27 08/30/2011 1253   BUN 17.6 10/24/2012 1014   BUN 20 11/13/2011 0955   BUN 17 08/30/2011 1253   CREATININE 1.2* 10/24/2012 1014   CREATININE 0.91 11/13/2011 0955   CREATININE 1.0 08/30/2011 1253      Component Value Date/Time   CALCIUM 9.6 10/24/2012 1014   CALCIUM 8.9 11/13/2011 0955   CALCIUM 9.1 08/30/2011 1253   ALKPHOS 54 10/24/2012 1014   ALKPHOS 49 11/13/2011 0955   ALKPHOS 45 08/30/2011 1253   AST 17 10/24/2012 1014   AST 17 11/13/2011 0955   AST 25 08/30/2011 1253   ALT 18 10/24/2012 1014   ALT 30 11/13/2011 0955   BILITOT 0.86 10/24/2012 1014   BILITOT 1.1 11/13/2011 0955   BILITOT 1.20 08/30/2011 1253       Lab Results  Component Value Date   LABCA2 29 05/29/2012    STUDIES: Dg Chest 2 View  10/24/2012   *RADIOLOGY REPORT*  Clinical Data: Metastatic breast cancer.  CHEST - 2 VIEW  Comparison: 08/21/2012.  Findings: The cardiac silhouette, mediastinal and hilar contours are within normal limits and stable.  There is tortuosity and calcification of the thoracic aorta.  The lungs are clear of acute process.  Minimal basilar scarring changes.  No definite metastatic pulmonary nodules.  No obvious destructive bony lesions.  IMPRESSION:  1.  No acute cardiopulmonary findings or definite metastatic pulmonary nodules. 2.  Minimal basilar scarring changes.   Original Report Authenticated By: Rudie Meyer, M.D.    Dg Bone Density  10/23/2011  The Bone Mineral Densitometry  Report was read as within normal limits   ASSESSMENT: 79 y.o.  woman:   (1) Status post left lumpectomy in 1996 for a T1b N0 grade 2 invasive ductal carcinoma which was strongly estrogen and progesterone receptor positive, treated with radiation, then anastrozole, then Evista, all treatment discontinued July 2012  (2) recurrence to the lining of the left lung and liver documented by liver biopsy July 2012, showing metastatic adenocarcinoma which was  estrogen and progesterone receptor positive at 96% and 97% respectively; there was no Her-2 amplification.   (3)  She has been on exemestane since early July of 2012, with chest  CT scan April 2013 showing complete resolution of her lung and liver metastases  (4) MRI of the lumbar spine 09-2012 showed significant degenerative disease but no evidence of metastatic disease to bone.  PLAN: Theresa Wallace is doing very well as far as her breast cancer is concerned, now just about 2 years into Evista with continuing response and excellent tolerance. She is conus see [place] again in 4 months. She will have her mammography the first week in September and then a chest x-ray and visit in early October. If all continues well then we will start seeing her on an every 6 month basis. The plan is to continue exemestane indefinitely, until there is evidence of progression.  MAGRINAT,GUSTAV C    10/31/2012

## 2012-10-31 NOTE — Telephone Encounter (Signed)
appts made and printed. gv appt d/t for [facility]. Pt is aware to go have cxr on 02/24/13 the day of her labs...td

## 2013-01-13 ENCOUNTER — Telehealth: Payer: Self-pay | Admitting: Oncology

## 2013-01-13 NOTE — Telephone Encounter (Signed)
, °

## 2013-02-19 ENCOUNTER — Other Ambulatory Visit (HOSPITAL_BASED_OUTPATIENT_CLINIC_OR_DEPARTMENT_OTHER): Payer: Medicare Other | Admitting: Lab

## 2013-02-19 ENCOUNTER — Ambulatory Visit (HOSPITAL_COMMUNITY)
Admission: RE | Admit: 2013-02-19 | Discharge: 2013-02-19 | Disposition: A | Discharge status: Home / Self Care | Payer: Medicare Other | Source: Ambulatory Visit | Attending: Oncology | Admitting: Oncology

## 2013-02-19 DIAGNOSIS — C50919 Malignant neoplasm of unspecified site of unspecified female breast: Secondary | ICD-10-CM

## 2013-02-19 DIAGNOSIS — C801 Malignant (primary) neoplasm, unspecified: Secondary | ICD-10-CM | POA: Insufficient documentation

## 2013-02-19 DIAGNOSIS — C50912 Malignant neoplasm of unspecified site of left female breast: Secondary | ICD-10-CM

## 2013-02-19 LAB — CBC WITH DIFFERENTIAL/PLATELET
Eosinophils Absolute: 0.4 10*3/uL (ref 0.0–0.5)
LYMPH%: 23.7 % (ref 14.0–49.7)
MONO#: 0.6 10*3/uL (ref 0.1–0.9)
NEUT#: 4.9 10*3/uL (ref 1.5–6.5)
Platelets: 175 10*3/uL (ref 145–400)
RBC: 4.2 10*6/uL (ref 3.70–5.45)
WBC: 7.8 10*3/uL (ref 3.9–10.3)

## 2013-02-19 LAB — COMPREHENSIVE METABOLIC PANEL (CC13)
Albumin: 3.7 g/dL (ref 3.5–5.0)
CO2: 25 mEq/L (ref 22–29)
Calcium: 9.6 mg/dL (ref 8.4–10.4)
Glucose: 123 mg/dl (ref 70–140)
Potassium: 4.6 mEq/L (ref 3.5–5.1)
Sodium: 136 mEq/L (ref 136–145)
Total Bilirubin: 1.21 mg/dL — ABNORMAL HIGH (ref 0.20–1.20)
Total Protein: 6.7 g/dL (ref 6.4–8.3)

## 2013-02-24 ENCOUNTER — Other Ambulatory Visit: Payer: Medicare Other | Admitting: Lab

## 2013-02-26 ENCOUNTER — Encounter: Payer: Self-pay | Admitting: Physician Assistant

## 2013-02-26 ENCOUNTER — Ambulatory Visit (HOSPITAL_BASED_OUTPATIENT_CLINIC_OR_DEPARTMENT_OTHER): Payer: Medicare Other | Admitting: Physician Assistant

## 2013-02-26 ENCOUNTER — Telehealth: Payer: Self-pay | Admitting: Oncology

## 2013-02-26 VITALS — BP 152/81 | HR 69 | Temp 98.1°F | Resp 18 | Ht 63.0 in | Wt 171.2 lb

## 2013-02-26 DIAGNOSIS — C7800 Secondary malignant neoplasm of unspecified lung: Secondary | ICD-10-CM

## 2013-02-26 DIAGNOSIS — C50912 Malignant neoplasm of unspecified site of left female breast: Secondary | ICD-10-CM

## 2013-02-26 DIAGNOSIS — C50919 Malignant neoplasm of unspecified site of unspecified female breast: Secondary | ICD-10-CM

## 2013-02-26 DIAGNOSIS — R17 Unspecified jaundice: Secondary | ICD-10-CM

## 2013-02-26 DIAGNOSIS — C787 Secondary malignant neoplasm of liver and intrahepatic bile duct: Secondary | ICD-10-CM

## 2013-02-26 NOTE — Progress Notes (Signed)
ID: [name]   DOB: 1923/07/04  MR#: [id number]  MVH#:[id number]  PCP: Pearla Dubonnet, MD GYN: SU:  OTHER MD:  CHIEF COMPLAINT:  Metastatic Breast Cancer   HISTORY OF PRESENT ILLNESS: Loriann Bosserman is an 79 y.o.  [place] woman I saw briefly when she moved to this area in 2007.  She had had a left breast cancer removed at [facility] in [place] in [phone]  It was grade 2, measured 1 cm maximally and was strongly estrogen and progesterone-receptor positive.  HER-2 was not checked.  This was T1b NX invasive ductal carcinoma, stage I, and she was treated after radiation with anastrozole for several years, eventually switching to Evista primarily for cost reasons.   More recently, she presented to Dr. Kevan Ny with fatigue and a dry cough.  He auscultated dullness to percussion at the left base and obtained a chest x-ray which confirmed the presence of a left pleural effusion.   Dr. Kevan Ny scheduled [name] for an ultrasound-guided left thoracentesis, and this was performed November 09, 2010.  Approximately 1 L of amber-colored fluid was removed.  Cytology from this procedure ([id number]) showed only reactive mesothelial cells.   The patient was then referred here and set up for a CT of the chest and a PET scan.  The CT of the chest on June 20th showed 2 slightly enlarged left axillary lymph nodes with some irregular contours but more importantly multiple pleural nodules in the left chest measuring up to 4.6 cm.  The right chest was normal.  There was no pericardial effusion.  There was only a small to moderate left pleural effusion remaining, and aside from left lower lobe collapse or consolidation, there was no evidence of lung parenchymal involvement.  There was no evidence of bone involvement and also no calcified pleural plaques and no worrisome bone involvement.  PET scan on June 27th showed the nodularity along the left pleura to be intensely hypermetabolic.  For  example the large lobe nodule measuring 4.5 cm had an SUV max of 12.9.  There was no hypermetabolic mediastinal nodal activity.  The to left axillary lymph nodes were very mildly hypermetabolic.  The left breast was clear.  There was 1 hypermetabolic lesion in the liver measuring 3.2 cm.  On June 27th, Dr. Yolanda Bonine performed fine-needle aspiration of a 7 mm abnormal-appearing lymph node in the lower left axilla.  The cytology from this procedure, however, ([name]) also was inconclusive showing no evidence of nodal tissue, mostly blood and fatty tissue. Her subsequent history is as detailed below  INTERVAL HISTORY: [name] returns with her significant other, [name], for followup of her metastatic breast cancer. She admits that she has been anxious over the past couple of weeks awaiting today's appointment, and was extremely relieved to hear that her labs and chest x-ray were basically unremarkable.   Overall, [name] has been feeling well. She did have a fall last week on 02/21/2013. She was in [place] for a wedding.  She stepped out of a car, tripped and fell backwards. She ended up with 2 stitches in her left elbow. She hit her head, and was evaluated in the emergency department in [place], including a head CT which fortunately showed no evidence of a bleed. She's had no significant headaches, dizziness, no change in vision since that time. She's had no additional falls.  Otherwise, interval history is generally unremarkable. She continues on exemestane with no associated side effects, and specifically denies any increased hot flashes,  vaginal dryness, or joint pain.  REVIEW OF SYSTEMS: [name] has had no recent illnesses and denies any fevers or chills. She has mild fatigue. Her appetite is good and she denies any nausea or change in bowel or bladder habits. She does have some heartburn which is stable. She's had no cough or phlegm production. She has some shortness of breath with exertion which is stable.  She feels some palpitations when she gets anxious, but has had no chest pain. She has joint pain she attributes to arthritis, but this is stable. She has occasional cramps in her legs.  Otherwise a detailed review of systems is stable noncontributory.   PAST MEDICAL HISTORY: Past Medical History  Diagnosis Date  . Hypertension   . Cancer     breast ca  . Breast cancer metastasized to multiple sites 06/07/2011  1. Hypercholesterolemia. 2. Benign tremor. 3. Sigmoid diverticulosis. 4. Onychomycosis. 5. Irritable bowel syndrome. 6. GERD. 7. History of palpitations with Holter monitor in 2008 showing PACs. 8. History of hysterectomy with no salpingo-oophorectomy. 9. History of cholecystectomy. 10. History of bilateral knee arthroscopic surgery. History of hemorrhoid surgery.  FAMILY HISTORY [name] died in an automobile accident.  [name] died with "bone cancer."  The patient had 3 sisters.  One had breast cancer develop in her 77s.  There is also a cousin with breast cancer but no history of ovarian cancer or other history of cancer in first-degree relatives.  GYNECOLOGIC HISTORY: She is GX, P4, first pregnancy to term at age 14.  She was on the hormone replacement after her hysterectomy but only briefly and stopped when she had the initial diagnosis of breast cancer in 1996.  SOCIAL HISTORY: (Updated 02/26/2013) She worked as a Network engineer.  She has been widowed since 2005, her husband [name] dying after a long illness involving strokes and a myocardial infarction.  She lives with [name] who is a retired Chief Financial Officer.  Her daughter [name] used to be a Network engineer for Dr. Inda Merlin, and [name]'s husband, [name], is a [address] assistant with Dr. Durward Fortes.  The patient has 12 grandchildren and 5 great-grandchildren.   ADVANCED DIRECTIVES: In place  HEALTH MAINTENANCE: (Updated 02/26/2013) History  Substance Use Topics  . Smoking status: Former Smoker     Quit date: 05/22/1968  . Smokeless tobacco: Never Used  . Alcohol Use: Yes     Colonoscopy:  PAP: s/p remote hysterectomy  Bone density:  10/23/2011, normal  Lipid panel: Dr. Inda Merlin, UTD   Allergies  Allergen Reactions  . Ciprofloxacin   . Epinephrine   . Tamoxifen     Current Outpatient Prescriptions  Medication Sig Dispense Refill  . aspirin 81 MG tablet Take 81 mg by mouth daily.      . Calcium Carbonate-Vitamin D (CALCIUM-VITAMIN D) 500-200 MG-UNIT per tablet Take 1 tablet by mouth 2 (two) times daily with a meal.      . cholecalciferol (VITAMIN D) 400 UNITS TABS Take 2,000 Units by mouth daily.      Marland Kitchen desonide (DESOWEN) 0.05 % ointment       . exemestane (AROMASIN) 25 MG tablet 1 tab by mouth daily  90 tablet  3  . FINACEA 15 % cream       . Glucosamine HCl-MSM (GLUCOSAMINE-MSM PO) Take 1 tablet by mouth daily.      Marland Kitchen levothyroxine (SYNTHROID, LEVOTHROID) 25 MCG tablet Take 25 mcg by mouth daily.      Marland Kitchen losartan (COZAAR) 50 MG tablet       .  Multiple Vitamin (MULTIVITAMIN) tablet Take 1 tablet by mouth daily.      Marland Kitchen NEXIUM 40 MG capsule       . niacin (NIASPAN) 500 MG CR tablet Take 500 mg by mouth at bedtime.      . propranolol ER (INDERAL LA) 60 MG 24 hr capsule        No current facility-administered medications for this visit.    Filed Vitals:   02/26/13 1207  BP: 152/81  Pulse: 69  Temp: 98.1 F (36.7 C)  Resp: 18     Body mass index is 30.33 kg/(m^2).    ECOG FS: 1 Filed Weights   02/26/13 1207  Weight: 171 lb 3.2 oz (77.656 kg)   PHYSICAL EXAM:  Elderly white woman in no acute distress  Physical Exam: HEENT:  Sclerae anicteric. Stable left ptosis. Oropharynx clear.  NODES:  No cervical or supraclavicular lymphadenopathy palpated.  BREAST EXAM: Right breast is unremarkable. Left breast is status post lumpectomy with no suspicious nodularity, skin changes, or evidence of local recurrence. Axillae are benign bilaterally, no palpable adenopathy. LUNGS:   Clear to auscultation bilaterally.  No wheezes or rhonchi HEART:  Regular rate and rhythm.  ABDOMEN:  Soft, nontender.  Positive bowel sounds.  MSK:  No focal spinal tenderness to palpation. Full range of motion bilaterally in the upper extremities EXTREMITIES:  No peripheral edema.   SKIN: 2 sutures are evident in the left elbow, healing well with no evidence of infection. NEURO:  Nonfocal. Well oriented.  Upbeat affect.   LAB RESULTS: Lab Results  Component Value Date   WBC 7.8 02/19/2013   NEUTROABS 4.9 02/19/2013   HGB 12.4 02/19/2013   HCT 36.8 02/19/2013   MCV 87.7 02/19/2013   PLT 175 02/19/2013      Chemistry      Component Value Date/Time   NA 136 02/19/2013 1040   NA 133* 11/13/2011 0955   NA 142 08/30/2011 1253   K 4.6 02/19/2013 1040   K 4.4 11/13/2011 0955   K 4.2 08/30/2011 1253   CL 104 10/24/2012 1014   CL 98 11/13/2011 0955   CL 101 08/30/2011 1253   CO2 25 02/19/2013 1040   CO2 29 11/13/2011 0955   CO2 27 08/30/2011 1253   BUN 17.0 02/19/2013 1040   BUN 20 11/13/2011 0955   BUN 17 08/30/2011 1253   CREATININE 1.1 02/19/2013 1040   CREATININE 0.91 11/13/2011 0955   CREATININE 1.0 08/30/2011 1253      Component Value Date/Time   CALCIUM 9.6 02/19/2013 1040   CALCIUM 8.9 11/13/2011 0955   CALCIUM 9.1 08/30/2011 1253   ALKPHOS 55 02/19/2013 1040   ALKPHOS 49 11/13/2011 0955   ALKPHOS 45 08/30/2011 1253   AST 17 02/19/2013 1040   AST 17 11/13/2011 0955   AST 25 08/30/2011 1253   ALT 16 02/19/2013 1040   ALT 30 11/13/2011 0955   ALT 23 08/30/2011 1253   BILITOT 1.21* 02/19/2013 1040   BILITOT 1.1 11/13/2011 0955   BILITOT 1.20 08/30/2011 1253       STUDIES: Dg Chest 2 View  02/19/2013   CLINICAL DATA:  Metastatic breast cancer  EXAM: CHEST  2 VIEW  COMPARISON:  10/24/2012  FINDINGS: Heart size is normal. Negative for heart failure. Lungs are clear without infiltrate effusion or mass. Negative for lung nodule. No bony lesions identified. Mild bibasilar scarring persists.   IMPRESSION: No active cardiopulmonary disease.   Electronically Signed   By: Leonette Most  Chestine Spore M.D.   On: 02/19/2013 13:54    Bilateral mamm[facility] at [facility] on 01/24/2013 was unremarkable.   Dg Bone Density  10/23/2011  The Bone Mineral Densitometry  Report was read as within normal limits   ASSESSMENT: 79 y.o. [place] woman:   (1) Status post left lumpectomy in 1996 for a T1b N0 grade 2 invasive ductal carcinoma which was strongly estrogen and progesterone receptor positive, treated with radiation, then anastrozole, then Evista, all treatment discontinued July 2012  (2) recurrence to the lining of the left lung and liver documented by liver biopsy July 2012, showing metastatic adenocarcinoma which was estrogen and progesterone receptor positive at 96% and 97% respectively; there was no Her-2 amplification.   (3)  She has been on exemestane since early July of 2012, with chest CT scan April 2013 showing complete resolution of her lung and liver metastases  (4) MRI of the lumbar spine 09-2012 showed significant degenerative disease but no evidence of metastatic disease to bone.  PLAN:  With regards to her breast cancer, [name] continues to do very well, and is now over 2 years out from her disease recurrence in July 2012. She is tolerating the exemestane very well, and our plan is to continue the exemestane indefinitely unless there is intolerance or evidence of disease progression. I have refilled this for her for another year.  Her total bilirubin was very mildly elevated today at 1.21. She does have a history of elevation in October 2013 as well, at 1.40. Although I expect that this is nothing, we will bring her back in another 6 weeks or so to repeat a metabolic panel only.  Otherwise, [name] will now be seen on a Q6 month basis. We will see her again in April of 2015, with a repeat chest x-ray and labs 1 week prior to her appointment.  Her next bone density will be due in June of 2015, and we  will schedule this when we see her in April. She is very comfortable and very happy with this plan. Of course as always, she knows to call with any changes or problems.   Lashara Urey    02/26/2013

## 2013-02-26 NOTE — Telephone Encounter (Signed)
, °

## 2013-04-09 ENCOUNTER — Other Ambulatory Visit (HOSPITAL_BASED_OUTPATIENT_CLINIC_OR_DEPARTMENT_OTHER): Payer: Medicare Other | Admitting: Lab

## 2013-04-09 ENCOUNTER — Encounter (INDEPENDENT_AMBULATORY_CARE_PROVIDER_SITE_OTHER): Payer: Self-pay

## 2013-04-09 DIAGNOSIS — C50912 Malignant neoplasm of unspecified site of left female breast: Secondary | ICD-10-CM

## 2013-04-09 DIAGNOSIS — C50919 Malignant neoplasm of unspecified site of unspecified female breast: Secondary | ICD-10-CM

## 2013-04-09 DIAGNOSIS — R17 Unspecified jaundice: Secondary | ICD-10-CM

## 2013-04-09 DIAGNOSIS — C7800 Secondary malignant neoplasm of unspecified lung: Secondary | ICD-10-CM

## 2013-04-09 DIAGNOSIS — C787 Secondary malignant neoplasm of liver and intrahepatic bile duct: Secondary | ICD-10-CM

## 2013-04-09 LAB — COMPREHENSIVE METABOLIC PANEL (CC13)
ALT: 17 U/L (ref 0–55)
Albumin: 4 g/dL (ref 3.5–5.0)
Anion Gap: 8 mEq/L (ref 3–11)
CO2: 25 mEq/L (ref 22–29)
Creatinine: 1.1 mg/dL (ref 0.6–1.1)
Potassium: 4.7 mEq/L (ref 3.5–5.1)
Total Bilirubin: 0.81 mg/dL (ref 0.20–1.20)

## 2013-08-28 ENCOUNTER — Ambulatory Visit (HOSPITAL_COMMUNITY)
Admission: RE | Admit: 2013-08-28 | Discharge: 2013-08-28 | Disposition: A | Discharge status: Home / Self Care | Payer: Medicare Other | Source: Ambulatory Visit | Attending: Physician Assistant | Admitting: Physician Assistant

## 2013-08-28 ENCOUNTER — Other Ambulatory Visit (HOSPITAL_BASED_OUTPATIENT_CLINIC_OR_DEPARTMENT_OTHER): Payer: Medicare Other

## 2013-08-28 DIAGNOSIS — C50919 Malignant neoplasm of unspecified site of unspecified female breast: Secondary | ICD-10-CM

## 2013-08-28 DIAGNOSIS — R17 Unspecified jaundice: Secondary | ICD-10-CM

## 2013-08-28 DIAGNOSIS — C50912 Malignant neoplasm of unspecified site of left female breast: Secondary | ICD-10-CM

## 2013-08-28 LAB — COMPREHENSIVE METABOLIC PANEL (CC13)
ALBUMIN: 4 g/dL (ref 3.5–5.0)
ALT: 14 U/L (ref 0–55)
AST: 17 U/L (ref 5–34)
Alkaline Phosphatase: 55 U/L (ref 40–150)
Anion Gap: 7 mEq/L (ref 3–11)
BILIRUBIN TOTAL: 1.07 mg/dL (ref 0.20–1.20)
BUN: 19.1 mg/dL (ref 7.0–26.0)
CO2: 26 mEq/L (ref 22–29)
Calcium: 9.6 mg/dL (ref 8.4–10.4)
Chloride: 104 mEq/L (ref 98–109)
Creatinine: 1.1 mg/dL (ref 0.6–1.1)
GLUCOSE: 94 mg/dL (ref 70–140)
POTASSIUM: 4.8 meq/L (ref 3.5–5.1)
SODIUM: 137 meq/L (ref 136–145)
Total Protein: 6.8 g/dL (ref 6.4–8.3)

## 2013-08-28 LAB — CBC WITH DIFFERENTIAL/PLATELET
BASO%: 0.6 % (ref 0.0–2.0)
BASOS ABS: 0 10*3/uL (ref 0.0–0.1)
EOS ABS: 0.3 10*3/uL (ref 0.0–0.5)
EOS%: 3.9 % (ref 0.0–7.0)
HEMATOCRIT: 37.6 % (ref 34.8–46.6)
HGB: 12.7 g/dL (ref 11.6–15.9)
LYMPH%: 19.8 % (ref 14.0–49.7)
MCH: 30 pg (ref 25.1–34.0)
MCHC: 33.7 g/dL (ref 31.5–36.0)
MCV: 89.3 fL (ref 79.5–101.0)
MONO#: 0.7 10*3/uL (ref 0.1–0.9)
MONO%: 8.4 % (ref 0.0–14.0)
NEUT%: 67.3 % (ref 38.4–76.8)
NEUTROS ABS: 5.7 10*3/uL (ref 1.5–6.5)
PLATELETS: 200 10*3/uL (ref 145–400)
RBC: 4.22 10*6/uL (ref 3.70–5.45)
RDW: 13.7 % (ref 11.2–14.5)
WBC: 8.5 10*3/uL (ref 3.9–10.3)
lymph#: 1.7 10*3/uL (ref 0.9–3.3)

## 2013-09-04 ENCOUNTER — Telehealth: Payer: Self-pay | Admitting: Oncology

## 2013-09-04 ENCOUNTER — Ambulatory Visit (HOSPITAL_BASED_OUTPATIENT_CLINIC_OR_DEPARTMENT_OTHER): Payer: Medicare Other | Admitting: Oncology

## 2013-09-04 VITALS — BP 144/73 | HR 62 | Temp 98.0°F | Resp 18 | Ht 63.0 in | Wt 167.5 lb

## 2013-09-04 DIAGNOSIS — C50919 Malignant neoplasm of unspecified site of unspecified female breast: Secondary | ICD-10-CM

## 2013-09-04 DIAGNOSIS — C800 Disseminated malignant neoplasm, unspecified: Secondary | ICD-10-CM

## 2013-09-04 NOTE — Addendum Note (Signed)
Addended by: Laureen Abrahams on: 09/04/2013 04:40 PM   Modules accepted: Orders

## 2013-09-04 NOTE — Progress Notes (Signed)
ID: Theresa Wallace   DOB: 1924/04/12  MR#: [id number]  ZHY#:[id number]  PCP: Henrine Screws, MD GYN: SU:  OTHER MD:  CHIEF COMPLAINT:  Metastatic Breast Cancer   HISTORY OF PRESENT ILLNESS: Theresa Wallace is an 80 y.o.  [name] I saw briefly when she moved to this area in 2007.  She had had a left breast cancer removed at [facility] in [place] in [phone] ([place]).  It was grade 2, measured 1 cm maximally and was strongly estrogen and progesterone-receptor positive.  HER-2 was not checked.  This was T1b NX invasive ductal carcinoma, stage I, and she was treated after radiation with anastrozole for several years, eventually switching to Evista primarily for cost reasons.   More recently, she presented to Dr. Inda Merlin with fatigue and a dry cough.  He auscultated dullness to percussion at the left base and obtained a chest x-ray which confirmed the presence of a left pleural effusion.   Dr. Inda Merlin scheduled [name] for an ultrasound-guided left thoracentesis, and this was performed November 09, 2010.  Approximately 1 L of amber-colored fluid was removed.  Cytology from this procedure ([id number]) showed only reactive mesothelial cells.   The patient was then referred here and set up for a CT of the chest and a PET scan.  The CT of the chest on June 20th showed 2 slightly enlarged left axillary lymph nodes with some irregular contours but more importantly multiple pleural nodules in the left chest measuring up to 4.6 cm.  The right chest was normal.  There was no pericardial effusion.  There was only a small to moderate left pleural effusion remaining, and aside from left lower lobe collapse or consolidation, there was no evidence of lung parenchymal involvement.  There was no evidence of bone involvement and also no calcified pleural plaques and no worrisome bone involvement.  PET scan on June 27th showed the nodularity along the left pleura to be intensely hypermetabolic.  For  example the large lobe nodule measuring 4.5 cm had an SUV max of 12.9.  There was no hypermetabolic mediastinal nodal activity.  The to left axillary lymph nodes were very mildly hypermetabolic.  The left breast was clear.  There was 1 hypermetabolic lesion in the liver measuring 3.2 cm.  On June 27th, Dr. [name] performed fine-needle aspiration of a 7 mm abnormal-appearing lymph node in the lower left axilla.  The cytology from this procedure, however, ([name]) also was inconclusive showing no evidence of nodal tissue, mostly blood and fatty tissue. Her subsequent history is as detailed below  INTERVAL HISTORY: Theresa Wallace returns with her significant other, Theresa Wallace, for followup of her metastatic breast cancer. She admits that she has been anxious over the past couple of weeks awaiting today's appointment, and was extremely relieved to hear that her labs and chest x-ray were basically unremarkable.   Overall, Theresa Wallace has been feeling well. She did have a fall last week on 02/21/2013. She was in [place] for a wedding.  She stepped out of a car, tripped and fell backwards. She ended up with 2 stitches in her left elbow. She hit her head, and was evaluated in the emergency department in [place], including a head CT which fortunately showed no evidence of a bleed. She's had no significant headaches, dizziness, no change in vision since that time. She's had no additional falls.  Otherwise, interval history is generally unremarkable. She continues on exemestane with no associated side effects, and specifically denies any increased hot flashes,  vaginal dryness, or joint pain.  REVIEW OF SYSTEMS: Theresa Wallace has had no recent illnesses and denies any fevers or chills. She has mild fatigue. Her appetite is good and she denies any nausea or change in bowel or bladder habits. She does have some heartburn which is stable. She's had no cough or phlegm production. She has some shortness of breath with exertion which is stable.  She feels some palpitations when she gets anxious, but has had no chest pain. She has joint pain she attributes to arthritis, but this is stable. She has occasional cramps in her legs.  Otherwise a detailed review of systems is stable noncontributory.   PAST MEDICAL HISTORY: Past Medical History  Diagnosis Date  . Hypertension   . Cancer     breast ca  . Breast cancer metastasized to multiple sites 06/07/2011  1. Hypercholesterolemia. 2. Benign tremor. 3. Sigmoid diverticulosis. 4. Onychomycosis. 5. Irritable bowel syndrome. 6. GERD. 7. History of palpitations with Holter monitor in 2008 showing PACs. 8. History of hysterectomy with no salpingo-oophorectomy. 9. History of cholecystectomy. 10. History of bilateral knee arthroscopic surgery. History of hemorrhoid surgery.  FAMILY HISTORY [name] died in an automobile accident.  [name] died with "bone cancer."  The patient had 3 sisters.  One had breast cancer develop in her 77s.  There is also a cousin with breast cancer but no history of ovarian cancer or other history of cancer in first-degree relatives.  GYNECOLOGIC HISTORY: She is GX, P4, first pregnancy to term at age 14.  She was on the hormone replacement after her hysterectomy but only briefly and stopped when she had the initial diagnosis of breast cancer in 1996.  SOCIAL HISTORY: (Updated 02/26/2013) She worked as a Network engineer.  She has been widowed since 2005, her husband [name] dying after a long illness involving strokes and a myocardial infarction.  She lives with [name] who is a retired Chief Financial Officer.  Her daughter [name] used to be a Network engineer for Dr. Inda Merlin, and [name]'s husband, [name], is a [address] with [address]. Durward Fortes.  The patient has 12 grandchildren and 5 great-grandchildren.   ADVANCED DIRECTIVES: In place  HEALTH MAINTENANCE: (Updated 02/26/2013) History  Substance Use Topics  . Smoking status: Former Smoker     Quit date: 05/22/1968  . Smokeless tobacco: Never Used  . Alcohol Use: Yes     Colonoscopy:  PAP: s/p remote hysterectomy  Bone density:  10/23/2011, normal  Lipid panel: Dr. Inda Merlin, UTD   Allergies  Allergen Reactions  . Ciprofloxacin   . Epinephrine   . Tamoxifen     Current Outpatient Prescriptions  Medication Sig Dispense Refill  . aspirin 81 MG tablet Take 81 mg by mouth daily.      . Calcium Carbonate-Vitamin D (CALCIUM-VITAMIN D) 500-200 MG-UNIT per tablet Take 1 tablet by mouth 2 (two) times daily with a meal.      . cholecalciferol (VITAMIN D) 400 UNITS TABS Take 2,000 Units by mouth daily.      Marland Kitchen desonide (DESOWEN) 0.05 % ointment       . exemestane (AROMASIN) 25 MG tablet 1 tab by mouth daily  90 tablet  3  . FINACEA 15 % cream       . Glucosamine HCl-MSM (GLUCOSAMINE-MSM PO) Take 1 tablet by mouth daily.      Marland Kitchen levothyroxine (SYNTHROID, LEVOTHROID) 25 MCG tablet Take 25 mcg by mouth daily.      Marland Kitchen losartan (COZAAR) 50 MG tablet       .  Multiple Vitamin (MULTIVITAMIN) tablet Take 1 tablet by mouth daily.      Marland Kitchen NEXIUM 40 MG capsule       . niacin (NIASPAN) 500 MG CR tablet Take 500 mg by mouth at bedtime.      . propranolol ER (INDERAL LA) 60 MG 24 hr capsule        No current facility-administered medications for this visit.    Filed Vitals:   09/04/13 1240  BP: 144/73  Pulse: 62  Temp: 98 F (36.7 C)  Resp: 18     Body mass index is 29.68 kg/(m^2).    ECOG FS: 1 Filed Weights   09/04/13 1240  Weight: 167 lb 8 oz (75.978 kg)   PHYSICAL EXAM:  Elderly white woman in no acute distress  Physical Exam: HEENT:  Sclerae anicteric. Stable left ptosis. Oropharynx clear.  NODES:  No cervical or supraclavicular lymphadenopathy palpated.  BREAST EXAM: Right breast is unremarkable. Left breast is status post lumpectomy with no suspicious nodularity, skin changes, or evidence of local recurrence. Axillae are benign bilaterally, no palpable adenopathy. LUNGS:   Clear to auscultation bilaterally.  No wheezes or rhonchi HEART:  Regular rate and rhythm.  ABDOMEN:  Soft, nontender.  Positive bowel sounds.  MSK:  No focal spinal tenderness to palpation. Full range of motion bilaterally in the upper extremities EXTREMITIES:  No peripheral edema.   SKIN: 2 sutures are evident in the left elbow, healing well with no evidence of infection. NEURO:  Nonfocal. Well oriented.  Upbeat affect.   LAB RESULTS: Lab Results  Component Value Date   WBC 8.5 08/28/2013   NEUTROABS 5.7 08/28/2013   HGB 12.7 08/28/2013   HCT 37.6 08/28/2013   MCV 89.3 08/28/2013   PLT 200 08/28/2013      Chemistry      Component Value Date/Time   NA 137 08/28/2013 1133   NA 133* 11/13/2011 0955   NA 142 08/30/2011 1253   K 4.8 08/28/2013 1133   K 4.4 11/13/2011 0955   K 4.2 08/30/2011 1253   CL 104 10/24/2012 1014   CL 98 11/13/2011 0955   CL 101 08/30/2011 1253   CO2 26 08/28/2013 1133   CO2 29 11/13/2011 0955   CO2 27 08/30/2011 1253   BUN 19.1 08/28/2013 1133   BUN 20 11/13/2011 0955   BUN 17 08/30/2011 1253   CREATININE 1.1 08/28/2013 1133   CREATININE 0.91 11/13/2011 0955   CREATININE 1.0 08/30/2011 1253      Component Value Date/Time   CALCIUM 9.6 08/28/2013 1133   CALCIUM 8.9 11/13/2011 0955   CALCIUM 9.1 08/30/2011 1253   ALKPHOS 55 08/28/2013 1133   ALKPHOS 49 11/13/2011 0955   ALKPHOS 45 08/30/2011 1253   AST 17 08/28/2013 1133   AST 17 11/13/2011 0955   AST 25 08/30/2011 1253   ALT 14 08/28/2013 1133   ALT 30 11/13/2011 0955   ALT 23 08/30/2011 1253   BILITOT 1.07 08/28/2013 1133   BILITOT 1.1 11/13/2011 0955   BILITOT 1.20 08/30/2011 1253       STUDIES: Dg Chest 2 View  02/19/2013   CLINICAL DATA:  Metastatic breast cancer  EXAM: CHEST  2 VIEW  COMPARISON:  10/24/2012  FINDINGS: Heart size is normal. Negative for heart failure. Lungs are clear without infiltrate effusion or mass. Negative for lung nodule. No bony lesions identified. Mild bibasilar scarring persists.  IMPRESSION: No active  cardiopulmonary disease.   Electronically Signed   By: Juanda Crumble  Carlis Abbott M.D.   On: 02/19/2013 13:54    Bilateral mammogram at [facility] on 01/24/2013 was unremarkable.   Dg Bone Density  10/23/2011  The Bone Mineral Densitometry  Report was read as within normal limits   ASSESSMENT: 80 y.o. [place] woman:   (1) Status post left lumpectomy in 1996 for a T1b N0 grade 2 invasive ductal carcinoma which was strongly estrogen and progesterone receptor positive, treated with radiation, then anastrozole, then Evista, all treatment discontinued July 2012  (2) recurrence to the lining of the left lung and liver documented by liver biopsy July 2012, showing metastatic adenocarcinoma which was estrogen and progesterone receptor positive at 96% and 97% respectively; there was no Her-2 amplification.   (3)  She has been on exemestane since early July of 2012, with chest CT scan April 2013 showing complete resolution of her lung and liver metastases  (4) MRI of the lumbar spine 09-2012 showed significant degenerative disease but no evidence of metastatic disease to bone.  PLAN:  With regards to her breast cancer, Theresa Wallace continues to do very well, and is now over 2 years out from her disease recurrence in July 2012. She is tolerating the exemestane very well, and our plan is to continue the exemestane indefinitely unless there is intolerance or evidence of disease progression. I have refilled this for her for another year.  Her total bilirubin was very mildly elevated today at 1.21. She does have a history of elevation in October 2013 as well, at 1.40. Although I expect that this is nothing, we will bring her back in another 6 weeks or so to repeat a metabolic panel only.  Otherwise, Theresa Wallace will now be seen on a Q6 month basis. We will see her again in April of 2015, with a repeat chest x-ray and labs 1 week prior to her appointment.  Her next bone density will be due in June of 2015, and we will schedule this when  we see her in April. She is very comfortable and very happy with this plan. Of course as always, she knows to call with any changes or problems.   Virgie Dad Maressa Apollo    09/04/2013

## 2013-09-04 NOTE — Progress Notes (Signed)
ID: [name]   DOB: 1924/05/03  MR#: [id number]  NWG#:[id number]  PCP: Henrine Screws, MD GYN: SU:  OTHER MD:  CHIEF COMPLAINT:  Metastatic Breast Cancer   HISTORY OF PRESENT ILLNESS: Theresa Wallace is an 80 y.o.  [place] woman I saw briefly when she moved to this area in 2007.  She had had a left breast cancer removed at [facility] in [place] in [phone] ([place]).  It was grade 2, measured 1 cm maximally and was strongly estrogen and progesterone-receptor positive.  HER-2 was not checked.  This was T1b NX invasive ductal carcinoma, stage I, and she was treated after radiation with anastrozole for several years, eventually switching to Evista primarily for cost reasons.   More recently, she presented to Dr. Inda Merlin with fatigue and a dry cough.  He auscultated dullness to percussion at the left base and obtained a chest x-ray which confirmed the presence of a left pleural effusion.   Dr. Inda Merlin scheduled [name] for an ultrasound-guided left thoracentesis, and this was performed November 09, 2010.  Approximately 1 L of amber-colored fluid was removed.  Cytology from this procedure ([id number]) showed only reactive mesothelial cells.   The patient was then referred here and set up for a CT of the chest and a PET scan.  The CT of the chest on June 20th showed 2 slightly enlarged left axillary lymph nodes with some irregular contours but more importantly multiple pleural nodules in the left chest measuring up to 4.6 cm.  The right chest was normal.  There was no pericardial effusion.  There was only a small to moderate left pleural effusion remaining, and aside from left lower lobe collapse or consolidation, there was no evidence of lung parenchymal involvement.  There was no evidence of bone involvement and also no calcified pleural plaques and no worrisome bone involvement.  PET scan on June 27th showed the nodularity along the left pleura to be intensely hypermetabolic.  For  example the large lobe nodule measuring 4.5 cm had an SUV max of 12.9.  There was no hypermetabolic mediastinal nodal activity.  The to left axillary lymph nodes were very mildly hypermetabolic.  The left breast was clear.  There was 1 hypermetabolic lesion in the liver measuring 3.2 cm.  On June 27th, Dr. [name] performed fine-needle aspiration of a 7 mm abnormal-appearing lymph node in the lower left axilla.  The cytology from this procedure, however, ([name]) also was inconclusive showing no evidence of nodal tissue, mostly blood and fatty tissue. Her subsequent history is as detailed below  INTERVAL HISTORY: Theresa Wallace returns with her significant other, [name], for followup of her metastatic breast cancer. She is tolerating the exemestane with no side effects that she is aware of. She will be due for repeat bone density June of this year.  REVIEW OF SYSTEMS: Theresa Wallace is having some leg cramps, particularly at night. We discussed hydration issues" there is no way" she can drink 2 quarts of liquid a day. We talked about stretching exercises. 1 time several months ago she had a sharp pain rising from her right neck to the right temple area. It lasted only a few seconds but it was "almost unbearable". It has not recurred. It was not accompanied by any changes in mentation and she denies any vision changes, nausea, vomiting, dizziness, or gait imbalance. She did tell me she fell while in [place] for a wedding last October. She hurt her left elbow and had a head CT at  the hospital in [place], and that scan was fine. She has hearing problems, other areas of Atarax for rhinitis, and some anxiety. She denies depression. She is very worried about [name] who she says is getting more demented. A detailed review of systems was otherwise noncontributory   PAST MEDICAL HISTORY: Past Medical History  Diagnosis Date  . Hypertension   . Cancer     breast ca  . Breast cancer metastasized to multiple sites 06/07/2011   1. Hypercholesterolemia. 2. Benign tremor. 3. Sigmoid diverticulosis. 4. Onychomycosis. 5. Irritable bowel syndrome. 6. GERD. 7. History of palpitations with Holter monitor in 2008 showing PACs. 8. History of hysterectomy with no salpingo-oophorectomy. 9. History of cholecystectomy. 10. History of bilateral knee arthroscopic surgery. History of hemorrhoid surgery.  FAMILY HISTORY [name] died in an automobile accident.  [name] died with "bone cancer."  The patient had 3 sisters.  One had breast cancer develop in her 68s.  There is also a cousin with breast cancer but no history of ovarian cancer or other history of cancer in first-degree relatives.  GYNECOLOGIC HISTORY: She is GX, P4, first pregnancy to term at age 62.  She was on the hormone replacement after her hysterectomy but only briefly and stopped when she had the initial diagnosis of breast cancer in 1996.  SOCIAL HISTORY: (Updated 02/26/2013) She worked as a Network engineer.  She has been widowed since 2005, her husband [name] dying after a long illness involving strokes and a myocardial infarction.  She lives with [name] who is a retired Chief Financial Officer.  Her daughter [name] used to be a Network engineer for Dr. Inda Merlin, and [name]'s husband, [name], is a 59 assistant with Dr. Durward Fortes.  The patient has 12 grandchildren and 5 great-grandchildren.   ADVANCED DIRECTIVES: In place  HEALTH MAINTENANCE: (Updated 02/26/2013) History  Substance Use Topics  . Smoking status: Former Smoker    Quit date: 05/22/1968  . Smokeless tobacco: Never Used  . Alcohol Use: Yes     Colonoscopy:  PAP: s/p remote hysterectomy  Bone density:  10/23/2011, normal  Lipid panel: Dr. Inda Merlin, UTD   Allergies  Allergen Reactions  . Ciprofloxacin   . Epinephrine   . Tamoxifen     Current Outpatient Prescriptions  Medication Sig Dispense Refill  . aspirin 81 MG tablet Take 81 mg by mouth daily.      . Calcium  Carbonate-Vitamin D (CALCIUM-VITAMIN D) 500-200 MG-UNIT per tablet Take 1 tablet by mouth 2 (two) times daily with a meal.      . cholecalciferol (VITAMIN D) 400 UNITS TABS Take 2,000 Units by mouth daily.      Marland Kitchen desonide (DESOWEN) 0.05 % ointment       . exemestane (AROMASIN) 25 MG tablet 1 tab by mouth daily  90 tablet  3  . FINACEA 15 % cream       . Glucosamine HCl-MSM (GLUCOSAMINE-MSM PO) Take 1 tablet by mouth daily.      Marland Kitchen levothyroxine (SYNTHROID, LEVOTHROID) 25 MCG tablet Take 25 mcg by mouth daily.      Marland Kitchen losartan (COZAAR) 50 MG tablet       . Multiple Vitamin (MULTIVITAMIN) tablet Take 1 tablet by mouth daily.      Marland Kitchen NEXIUM 40 MG capsule       . niacin (NIASPAN) 500 MG CR tablet Take 500 mg by mouth at bedtime.      . propranolol ER (INDERAL LA) 60 MG 24 hr capsule  No current facility-administered medications for this visit.     Elderly white woman in no acute distress  Sclerae unicteric, pupils equal and reactive Oropharynx clear and moist-- teeth in good repair No cervical or supraclavicular adenopathy Lungs no rales or rhonchi Heart regular rate and rhythm Abd soft, nontender, positive bowel sounds MSK no focal spinal tenderness, no upper extremity lymphedema, some discomfort on neck rotation to the right Neuro: nonfocal, well oriented, appropriate affect The right breast is unremarkable. The left breast is status post lumpectomy and radiation. There is no evidence of local recurrence. The left axilla is benign.   LAB RESULTS: Lab Results  Component Value Date   WBC 8.5 08/28/2013   NEUTROABS 5.7 08/28/2013   HGB 12.7 08/28/2013   HCT 37.6 08/28/2013   MCV 89.3 08/28/2013   PLT 200 08/28/2013      Chemistry      Component Value Date/Time   NA 137 08/28/2013 1133   NA 133* 11/13/2011 0955   NA 142 08/30/2011 1253   K 4.8 08/28/2013 1133   K 4.4 11/13/2011 0955   K 4.2 08/30/2011 1253   CL 104 10/24/2012 1014   CL 98 11/13/2011 0955   CL 101 08/30/2011 1253   CO2 26  08/28/2013 1133   CO2 29 11/13/2011 0955   CO2 27 08/30/2011 1253   BUN 19.1 08/28/2013 1133   BUN 20 11/13/2011 0955   BUN 17 08/30/2011 1253   CREATININE 1.1 08/28/2013 1133   CREATININE 0.91 11/13/2011 0955   CREATININE 1.0 08/30/2011 1253      Component Value Date/Time   CALCIUM 9.6 08/28/2013 1133   CALCIUM 8.9 11/13/2011 0955   CALCIUM 9.1 08/30/2011 1253   ALKPHOS 55 08/28/2013 1133   ALKPHOS 49 11/13/2011 0955   ALKPHOS 45 08/30/2011 1253   AST 17 08/28/2013 1133   AST 17 11/13/2011 0955   AST 25 08/30/2011 1253   ALT 14 08/28/2013 1133   ALT 30 11/13/2011 0955   ALT 23 08/30/2011 1253   BILITOT 1.07 08/28/2013 1133   BILITOT 1.1 11/13/2011 0955   BILITOT 1.20 08/30/2011 1253       STUDIES: Dg Chest 2 View  08/28/2013   CLINICAL DATA:  Breast cancer.  EXAM: CHEST  2 VIEW  COMPARISON:  02/19/2013  FINDINGS: Cardiac silhouette is normal in size and configuration. Normal mediastinal and hilar contours.  Lungs are hyperexpanded. There is minor reticular scarring or atelectasis at the bases. Lungs are otherwise clear. No lung nodule. No pleural effusion.  Bony thorax is demineralized. No osteoblastic or osteolytic lesions.  IMPRESSION: No acute cardiopulmonary disease. No radiographic evidence of metastatic disease.   Electronically Signed   By: Lajean Manes M.D.   On: 08/28/2013 13:07   Bilateral mammogram at [facility] on 01/24/2013 was unremarkable.  10/23/2011  The Bone Mineral Densitometry  Report was read as within normal limits   ASSESSMENT: 80 y.o. [place] woman:   (1) Status post left lumpectomy in 1996 for a T1b N0 grade 2 invasive ductal carcinoma which was strongly estrogen and progesterone receptor positive, treated with radiation, then anastrozole, then Evista, all treatment discontinued July 2012  (2) recurrence to the lining of the left lung and liver documented by liver biopsy July 2012, showing metastatic adenocarcinoma which was estrogen and progesterone receptor positive at 96% and 97%  respectively; there was no Her-2 amplification.   (3)  She has been on exemestane since early July of 2012, with chest CT scan April 2013  showing complete resolution of her lung and liver metastases  (4) MRI of the lumbar spine 09-2012 showed significant degenerative disease but no evidence of metastatic disease to bone.  PLAN:   Theresa Wallace is doing remarkably well as far as her breast cancer is concerned. She is tolerating her exemestane without any side effects that she is aware of. She is having some neck symptoms which I think are going to be related to [facility] arthritis and I offered to set her up for plain cervical spine films this week or next, but she "wants to wait". She will let us know if she develops any more symptoms regarding her right neck or bad unusual headaches she developed once.  She is concerned about George's  memory and thinks he may be in early stage dementia. He is being followed closely for this through Dr. Danna Hefty office  Otherwise she will see [place] again in 6 months. We will obtain a chest x-ray the same day. The plan is to continue letrozole indefinitely until we see evidence of disease progression. She will call for any problems that may develop before her next visit here.   Virgie Dad Magrinat    09/04/2013

## 2013-09-24 ENCOUNTER — Other Ambulatory Visit: Payer: Self-pay

## 2013-09-24 DIAGNOSIS — C50919 Malignant neoplasm of unspecified site of unspecified female breast: Secondary | ICD-10-CM

## 2013-09-24 MED ORDER — EXEMESTANE 25 MG PO TABS: ORAL_TABLET | ORAL | Status: DC

## 2013-09-24 NOTE — Telephone Encounter (Signed)
Rcvd refill request for emexestane from [facility].  Refill sent, rcpt confirmed by pharmacy

## 2014-02-26 ENCOUNTER — Other Ambulatory Visit (HOSPITAL_BASED_OUTPATIENT_CLINIC_OR_DEPARTMENT_OTHER): Payer: Medicare Other

## 2014-02-26 DIAGNOSIS — Z853 Personal history of malignant neoplasm of breast: Secondary | ICD-10-CM

## 2014-02-26 DIAGNOSIS — C50919 Malignant neoplasm of unspecified site of unspecified female breast: Secondary | ICD-10-CM

## 2014-02-26 LAB — CBC WITH DIFFERENTIAL/PLATELET
BASO%: 1 % (ref 0.0–2.0)
Basophils Absolute: 0.1 10*3/uL (ref 0.0–0.1)
EOS%: 5.6 % (ref 0.0–7.0)
Eosinophils Absolute: 0.5 10*3/uL (ref 0.0–0.5)
HCT: 37.9 % (ref 34.8–46.6)
HEMOGLOBIN: 12.4 g/dL (ref 11.6–15.9)
LYMPH#: 1.9 10*3/uL (ref 0.9–3.3)
LYMPH%: 22.4 % (ref 14.0–49.7)
MCH: 28.8 pg (ref 25.1–34.0)
MCHC: 32.7 g/dL (ref 31.5–36.0)
MCV: 88.1 fL (ref 79.5–101.0)
MONO#: 0.7 10*3/uL (ref 0.1–0.9)
MONO%: 8 % (ref 0.0–14.0)
NEUT#: 5.2 10*3/uL (ref 1.5–6.5)
NEUT%: 63 % (ref 38.4–76.8)
Platelets: 200 10*3/uL (ref 145–400)
RBC: 4.31 10*6/uL (ref 3.70–5.45)
RDW: 13.4 % (ref 11.2–14.5)
WBC: 8.3 10*3/uL (ref 3.9–10.3)

## 2014-02-26 LAB — COMPREHENSIVE METABOLIC PANEL (CC13)
ALBUMIN: 3.6 g/dL (ref 3.5–5.0)
ALT: 17 U/L (ref 0–55)
AST: 14 U/L (ref 5–34)
Alkaline Phosphatase: 51 U/L (ref 40–150)
Anion Gap: 6 mEq/L (ref 3–11)
BUN: 22.2 mg/dL (ref 7.0–26.0)
CALCIUM: 9.7 mg/dL (ref 8.4–10.4)
CHLORIDE: 109 meq/L (ref 98–109)
CO2: 27 mEq/L (ref 22–29)
Creatinine: 1.1 mg/dL (ref 0.6–1.1)
Glucose: 119 mg/dl (ref 70–140)
POTASSIUM: 4.7 meq/L (ref 3.5–5.1)
Sodium: 142 mEq/L (ref 136–145)
Total Bilirubin: 0.91 mg/dL (ref 0.20–1.20)
Total Protein: 6.6 g/dL (ref 6.4–8.3)

## 2014-03-05 ENCOUNTER — Telehealth: Payer: Self-pay | Admitting: Oncology

## 2014-03-05 ENCOUNTER — Ambulatory Visit (HOSPITAL_BASED_OUTPATIENT_CLINIC_OR_DEPARTMENT_OTHER): Payer: Medicare Other | Admitting: Oncology

## 2014-03-05 ENCOUNTER — Ambulatory Visit (HOSPITAL_COMMUNITY)
Admission: RE | Admit: 2014-03-05 | Discharge: 2014-03-05 | Disposition: A | Discharge status: Home / Self Care | Payer: Medicare Other | Source: Ambulatory Visit | Attending: Oncology | Admitting: Oncology

## 2014-03-05 VITALS — BP 176/72 | HR 65 | Temp 98.3°F | Resp 20 | Ht 63.0 in | Wt 168.0 lb

## 2014-03-05 DIAGNOSIS — C50912 Malignant neoplasm of unspecified site of left female breast: Secondary | ICD-10-CM | POA: Insufficient documentation

## 2014-03-05 DIAGNOSIS — R51 Headache: Secondary | ICD-10-CM

## 2014-03-05 DIAGNOSIS — Z853 Personal history of malignant neoplasm of breast: Secondary | ICD-10-CM

## 2014-03-05 NOTE — Progress Notes (Signed)
ID: Theresa Wallace   DOB: Feb 23, 1924  MR#: [id number]  Theresa Wallace#:[id number]  PCP: Theresa Screws, MD GYN: SU:  OTHER MD:  CHIEF COMPLAINT:  Metastatic Breast Cancer  CURRENT TREATMENT: Exemestane   HISTORY OF PRESENT ILLNESS: From the prior summary:  Theresa Wallace is an 80 y.o.  Theresa Wallace woman I saw briefly when she moved to this area in 2007.  She had had a left breast cancer removed at [facility] in [place] in [phone] ([place]).  It was grade 2, measured 1 cm maximally and was strongly estrogen and progesterone-receptor positive.  HER-2 was not checked.  This was T1b NX invasive ductal carcinoma, stage I, and she was treated after radiation with anastrozole for several years, eventually switching to Evista primarily for cost reasons.   More recently, she presented to Theresa Wallace with fatigue and a dry cough.  He auscultated dullness to percussion at the left base and obtained a chest x-ray which confirmed the presence of a left pleural effusion.   Theresa Wallace scheduled Theresa Wallace for an ultrasound-guided left thoracentesis, and this was performed November 09, 2010.  Approximately 1 L of amber-colored fluid was removed.  Cytology from this procedure ([id number]) showed only reactive mesothelial cells.   The patient was then referred here and set up for a CT of the chest and a PET scan.  The CT of the chest on June 20th showed 2 slightly enlarged left axillary lymph nodes with some irregular contours but more importantly multiple pleural nodules in the left chest measuring up to 4.6 cm.  The right chest was normal.  There was no pericardial effusion.  There was only a small to moderate left pleural effusion remaining, and aside from left lower lobe collapse or consolidation, there was no evidence of lung parenchymal involvement.  There was no evidence of bone involvement and also no calcified pleural plaques and no worrisome bone involvement.  PET scan on June 27th showed the nodularity  along the left pleura to be intensely hypermetabolic.  For example the large lobe nodule measuring 4.5 cm had an SUV max of 12.9.  There was no hypermetabolic mediastinal nodal activity.  The to left axillary lymph nodes were very mildly hypermetabolic.  The left breast was clear.  There was 1 hypermetabolic lesion in the liver measuring 3.2 cm.  On June 27th, Dr. Isaiah Wallace performed fine-needle aspiration of a 7 mm abnormal-appearing lymph node in the lower left axilla.  The cytology from this procedure, however, ([name]) also was inconclusive showing no evidence of nodal tissue, mostly blood and fatty tissue.   Her subsequent history is as detailed below  INTERVAL HISTORY: Theresa Wallace returnstoday for followup of her metastatic breast cancer  with her significant other, Theresa Wallace. The interval history is generally unremarkable. She tolerates the exemestane without any side effects that she is aware of. It has become a little bit more expensive for some reason: About $35 a month at this point.   REVIEW OF SYSTEMS: Theresa Wallace has been experiencing intermittent headaches for about a year. They tend to be a right-sided. That may occur a couple of times a month. They can be severe, although not accompanied by visual changes, nausea, or vomiting. They do sound like cluster headaches. In addition she has occasional mild gall headaches here in there. Currently she denies any sinus symptoms. She had right cataract surgery earlier this week and did well with that. She has a left cataract planned for early November. She has no cough, phlegm  production, pleurisy, or worsening shortness of breath. She is not exercising regularly. A detailed review of systems today was otherwise stable   PAST MEDICAL HISTORY: Past Medical History  Diagnosis Date  . Hypertension   . Cancer     breast ca  . Breast cancer metastasized to multiple sites 06/07/2011  1. Hypercholesterolemia. 2. Benign tremor. 3. Sigmoid  diverticulosis. 4. Onychomycosis. 5. Irritable bowel syndrome. 6. GERD. 7. History of palpitations with Holter monitor in 2008 showing PACs. 8. History of hysterectomy with no salpingo-oophorectomy. 9. History of cholecystectomy. 10. History of bilateral knee arthroscopic surgery. History of hemorrhoid surgery.  FAMILY HISTORY [name] died in an automobile accident.  [name] died with "bone cancer."  The patient had 3 sisters.  One had breast cancer develop in her 74s.  There is also a cousin with breast cancer but no history of ovarian cancer or other history of cancer in first-degree relatives.  GYNECOLOGIC HISTORY: She is GX, P4, first pregnancy to term at age 61.  She was on the hormone replacement after her hysterectomy but only briefly and stopped when she had the initial diagnosis of breast cancer in 1996.  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  She lives with Theresa Wallace who is a retired Chief Financial Officer.  Her daughter Theresa Wallace used to be a Network engineer for Theresa Wallace, and Theresa Wallace's husband, Theresa Wallace, is a [address] with [address] Durward Wallace.  The patient has 12 grandchildren and 5 great-grandchildren.   ADVANCED DIRECTIVES: In place  HEALTH MAINTENANCE: (Updated 02/26/2013) History  Substance Use Topics  . Smoking status: Former Smoker    Quit date: 05/22/1968  . Smokeless tobacco: Never Used  . Alcohol Use: Yes     Colonoscopy:  PAP: s/p remote hysterectomy  Bone density:  10/23/2011, normal  Lipid panel: Theresa Wallace, UTD   Allergies  Allergen Reactions  . Ciprofloxacin   . Epinephrine   . Tamoxifen     Current Outpatient Prescriptions  Medication Sig Dispense Refill  . aspirin 81 MG tablet Take 81 mg by mouth daily.      . Calcium Carbonate-Vitamin D (CALCIUM-VITAMIN D) 500-200 MG-UNIT per  tablet Take 1 tablet by mouth 2 (two) times daily with a meal.      . cholecalciferol (VITAMIN D) 400 UNITS TABS Take 2,000 Units by mouth daily.      Marland Kitchen exemestane (AROMASIN) 25 MG tablet 1 tab by mouth daily  90 tablet  3  . FINACEA 15 % cream       . Glucosamine HCl-MSM (GLUCOSAMINE-MSM PO) Take 1 tablet by mouth daily.      Marland Kitchen levothyroxine (SYNTHROID, LEVOTHROID) 25 MCG tablet Take 25 mcg by mouth daily.      Marland Kitchen losartan (COZAAR) 50 MG tablet       . Multiple Vitamin (MULTIVITAMIN) tablet Take 1 tablet by mouth daily.      Marland Kitchen NEXIUM 40 MG capsule       . niacin (NIASPAN) 500 MG CR tablet Take 500 mg by mouth at bedtime.      . propranolol ER (INDERAL LA) 60 MG 24 hr capsule        No current facility-administered medications for this visit.   Filed Vitals:   03/05/14 1139  BP: 176/72    Repeat 142/76  Pulse: 65  Temp: 98.3 F (36.8 C)  Resp: 20   OBJECTIVE:  Elderly white woman in no acute distress  Sclerae unicteric,  EOMs intact, mild left ptosis  Oropharynx clear, dentition in good repair  No cervical or supraclavicular adenopathy Lungs no rales or rhonchi, Fair excursion bilaterally  Heart regular rate and rhythm Abd soft, nontender, positive bowel sounds MSK no focal spinal tenderness, no upper extremity lymphedema Neuro: nonfocal, well oriented,  positive  affect The right breast is unremarkable. The left breast is status post lumpectomy and radiation. There is no evidence of local recurrence. The left axilla is benign.   LAB RESULTS: Lab Results  Component Value Date   WBC 8.3 02/26/2014   NEUTROABS 5.2 02/26/2014   HGB 12.4 02/26/2014   HCT 37.9 02/26/2014   MCV 88.1 02/26/2014   PLT 200 02/26/2014      Chemistry      Component Value Date/Time   NA 142 02/26/2014 1125   NA 133* 11/13/2011 0955   NA 142 08/30/2011 1253   K 4.7 02/26/2014 1125   K 4.4 11/13/2011 0955   K 4.2 08/30/2011 1253   CL 104 10/24/2012 1014   CL 98 11/13/2011 0955   CL 101 08/30/2011 1253    CO2 27 02/26/2014 1125   CO2 29 11/13/2011 0955   CO2 27 08/30/2011 1253   BUN 22.2 02/26/2014 1125   BUN 20 11/13/2011 0955   BUN 17 08/30/2011 1253   CREATININE 1.1 02/26/2014 1125   CREATININE 0.91 11/13/2011 0955   CREATININE 1.0 08/30/2011 1253      Component Value Date/Time   CALCIUM 9.7 02/26/2014 1125   CALCIUM 8.9 11/13/2011 0955   CALCIUM 9.1 08/30/2011 1253   ALKPHOS 51 02/26/2014 1125   ALKPHOS 49 11/13/2011 0955   ALKPHOS 45 08/30/2011 1253   AST 14 02/26/2014 1125   AST 17 11/13/2011 0955   AST 25 08/30/2011 1253   ALT 17 02/26/2014 1125   ALT 30 11/13/2011 0955   ALT 23 08/30/2011 1253   BILITOT 0.91 02/26/2014 1125   BILITOT 1.1 11/13/2011 0955   BILITOT 1.20 08/30/2011 1253      STUDIES: Bilateral mammogram at [facility] on 01/28/2014 was unremarkable.  10/23/2011 bone density was read as within normal limits   ASSESSMENT: 80 y.o. [place] woman:   (1) Status post left lumpectomy in 1996 for a T1b N0 grade 2 invasive ductal carcinoma which was strongly estrogen and progesterone receptor positive, treated with radiation, then anastrozole, then Evista, all treatment discontinued July 2012  (2) recurrence to the lining of the left lung and liver documented by liver biopsy July 2012, showing metastatic adenocarcinoma which was estrogen and progesterone receptor positive at 96% and 97% respectively; there was no Her-2 amplification.   (3)  She has been on exemestane since early July of 2012, with chest CT scan April 2013 showing complete resolution of her lung and liver metastases  (a) bone density in June of 08/09/2011 was normal  (4) MRI of the lumbar spine 09-2012 showed significant degenerative disease but no evidence of metastatic disease to bone.  PLAN:   Theresa Wallace continues to do remarkably well as far as her metastatic breast cancer is concerned. There is no clinical evidence of disease activity. We are going to obtain a chest x-ray today--that should have been done about a week ago,  as we usually schedule 1 with each 6 month visit. However there have been no lung symptoms to raise suspicion of progressive disease in the lungs.  The headaches may  be cluster headaches or it may be a form of trigeminal neuralgia. It seems to be rather infrequent and it seems to be getting a little bit better. I am not sure that neurologic referral is warranted. I don't think this is related to her history of breast cancer so we are not proceeding to an MRI of the brain.  She was anxious before today's visit, and her blood pressure was initially elevated, but she relax during the visit and repeat was more acceptable.  She will see me again in 6 months. We will do lab work and a chest x-ray prior to that visit. She knows to call for any problems that may develop before then.   MAGRINAT,GUSTAV C    03/05/2014

## 2014-03-05 NOTE — Telephone Encounter (Signed)
, °

## 2014-07-15 ENCOUNTER — Other Ambulatory Visit: Payer: Self-pay | Admitting: Dermatology

## 2014-08-31 ENCOUNTER — Other Ambulatory Visit (HOSPITAL_BASED_OUTPATIENT_CLINIC_OR_DEPARTMENT_OTHER): Payer: Medicare Other

## 2014-08-31 ENCOUNTER — Ambulatory Visit (HOSPITAL_COMMUNITY)
Admission: RE | Admit: 2014-08-31 | Discharge: 2014-08-31 | Disposition: A | Discharge status: Home / Self Care | Payer: Medicare Other | Source: Ambulatory Visit | Attending: Oncology | Admitting: Oncology

## 2014-08-31 DIAGNOSIS — C50919 Malignant neoplasm of unspecified site of unspecified female breast: Secondary | ICD-10-CM

## 2014-08-31 DIAGNOSIS — J9811 Atelectasis: Secondary | ICD-10-CM | POA: Insufficient documentation

## 2014-08-31 DIAGNOSIS — Z87891 Personal history of nicotine dependence: Secondary | ICD-10-CM | POA: Insufficient documentation

## 2014-08-31 DIAGNOSIS — I10 Essential (primary) hypertension: Secondary | ICD-10-CM | POA: Insufficient documentation

## 2014-08-31 DIAGNOSIS — C50912 Malignant neoplasm of unspecified site of left female breast: Secondary | ICD-10-CM | POA: Diagnosis not present

## 2014-08-31 LAB — CBC WITH DIFFERENTIAL/PLATELET
BASO%: 0.7 % (ref 0.0–2.0)
BASOS ABS: 0.1 10*3/uL (ref 0.0–0.1)
EOS%: 4.6 % (ref 0.0–7.0)
Eosinophils Absolute: 0.4 10*3/uL (ref 0.0–0.5)
HCT: 39.3 % (ref 34.8–46.6)
HGB: 12.5 g/dL (ref 11.6–15.9)
LYMPH%: 20.1 % (ref 14.0–49.7)
MCH: 27.4 pg (ref 25.1–34.0)
MCHC: 31.8 g/dL (ref 31.5–36.0)
MCV: 86.3 fL (ref 79.5–101.0)
MONO#: 0.7 10*3/uL (ref 0.1–0.9)
MONO%: 7.8 % (ref 0.0–14.0)
NEUT#: 5.9 10*3/uL (ref 1.5–6.5)
NEUT%: 66.8 % (ref 38.4–76.8)
PLATELETS: 204 10*3/uL (ref 145–400)
RBC: 4.55 10*6/uL (ref 3.70–5.45)
RDW: 14.6 % — ABNORMAL HIGH (ref 11.2–14.5)
WBC: 8.9 10*3/uL (ref 3.9–10.3)
lymph#: 1.8 10*3/uL (ref 0.9–3.3)

## 2014-08-31 LAB — COMPREHENSIVE METABOLIC PANEL (CC13)
ALT: 16 U/L (ref 0–55)
AST: 14 U/L (ref 5–34)
Albumin: 3.9 g/dL (ref 3.5–5.0)
Alkaline Phosphatase: 49 U/L (ref 40–150)
Anion Gap: 9 mEq/L (ref 3–11)
BUN: 24.4 mg/dL (ref 7.0–26.0)
CALCIUM: 9.3 mg/dL (ref 8.4–10.4)
CHLORIDE: 105 meq/L (ref 98–109)
CO2: 28 mEq/L (ref 22–29)
Creatinine: 1.1 mg/dL (ref 0.6–1.1)
EGFR: 47 mL/min/{1.73_m2} — ABNORMAL LOW (ref >90)
Glucose: 84 mg/dl (ref 70–140)
Potassium: 5 mEq/L (ref 3.5–5.1)
Sodium: 142 mEq/L (ref 136–145)
Total Bilirubin: 1.04 mg/dL (ref 0.20–1.20)
Total Protein: 6.5 g/dL (ref 6.4–8.3)

## 2014-09-01 ENCOUNTER — Ambulatory Visit (HOSPITAL_COMMUNITY): Payer: Medicare Other

## 2014-09-06 NOTE — Progress Notes (Signed)
ID: Theresa Wallace   DOB: 01/30/1924  MR#: [id number]  CSN#:[id number]  PCP: Wallace,Theresa NEVILL, MD GYN: SU:  OTHER MD:  CHIEF COMPLAINT:  Metastatic Breast Cancer  CURRENT TREATMENT: Exemestane   HISTORY OF PRESENT ILLNESS: From the prior summary:  Theresa Wallace is an 81 y.o.  Theresa Wallace I saw briefly when she moved to this area in 2007.  She had had a left breast cancer removed at [facility] in [place] in 1996 ([id number]).  It was grade 2, measured 1 cm maximally and was strongly estrogen and progesterone-receptor positive.  HER-2 was not checked.  This was T1b NX invasive ductal carcinoma, stage I, and she was treated after radiation with anastrozole for several years, eventually switching to Evista primarily for cost reasons.   More recently, she presented to Theresa Wallace with fatigue and a dry cough.  He auscultated dullness to percussion at the left base and obtained a chest x-ray which confirmed the presence of a left pleural effusion.   Theresa Wallace scheduled Theresa Wallace for an ultrasound-guided left thoracentesis, and this was performed November 09, 2010.  Approximately 1 L of amber-colored fluid was removed.  Cytology from this procedure ([id number]) showed only reactive mesothelial cells.   The patient was then referred here and set up for a CT of the chest and a PET scan.  The CT of the chest on June 20th showed 2 slightly enlarged left axillary lymph nodes with some irregular contours but more importantly multiple pleural nodules in the left chest measuring up to 4.6 cm.  The right chest was normal.  There was no pericardial effusion.  There was only a small to moderate left pleural effusion remaining, and aside from left lower lobe collapse or consolidation, there was no evidence of lung parenchymal involvement.  There was no evidence of bone involvement and also no calcified pleural plaques and no worrisome bone involvement.  PET scan on June 27th showed the nodularity  along the left pleura to be intensely hypermetabolic.  For example the large lobe nodule measuring 4.5 cm had an SUV max of 12.9.  There was no hypermetabolic mediastinal nodal activity.  The to left axillary lymph nodes were very mildly hypermetabolic.  The left breast was clear.  There was 1 hypermetabolic lesion in the liver measuring 3.2 cm.  On June 27th, Theresa Wallace performed fine-needle aspiration of a 7 mm abnormal-appearing lymph node in the lower left axilla.  The cytology from this procedure, however, ([name]) also was inconclusive showing no evidence of nodal tissue, mostly blood and fatty tissue.   Her subsequent history is as detailed below  INTERVAL HISTORY: Theresa Wallace returnstoday for followup of her metastatic breast cancer accompanied by her daughter. Theresa Wallace is tolerating exemestane well. Hot flashes, vaginal dryness, and arthralgias/myalgias are not prominent problems. She is obtaining it at a fair price.  REVIEW OF SYSTEMS: Theresa Wallace tells me she feels tired but actually she has an excellent functional status for her age. She is busy all day and then takes a half an hour to one hour nap in the afternoon. She then is up until 10 or 11 at night and sometimes gets up as early as 7 in the morning. She had Theresa Wallace's cleaned up the entire up stairs "spanking clean". She does have nocturia 1. Aside from that issue, the right frontal headaches that she described last time occur very seldom, perhaps once a month, are very transient, perhaps 1 minute, and are fainter than before. They're   not accompanied by any visual changes, dizziness, or gait imbalance. She does have some heartburn and nausea problems. She takes Nexium for this. She has occasional abdominal cramps. She has stress urinary incontinence. Just a history of rosacea. A detailed review of systems was otherwise stable  PAST MEDICAL HISTORY: Past Medical History  Diagnosis Date  . Hypertension   . Cancer     breast ca  . Breast cancer  metastasized to multiple sites 06/07/2011  1. Hypercholesterolemia. 2. Benign tremor. 3. Sigmoid diverticulosis. 4. Onychomycosis. 5. Irritable bowel syndrome. 6. GERD. 7. History of palpitations with Holter monitor in 2008 showing PACs. 8. History of hysterectomy with no salpingo-oophorectomy. 9. History of cholecystectomy. 10. History of bilateral knee arthroscopic surgery. History of hemorrhoid surgery.  FAMILY HISTORY [name] died in an automobile accident.  [name] died with "bone cancer."  The patient had 3 sisters.  One had breast cancer develop in her 32s.  There is also a cousin with breast cancer but no history of ovarian cancer or other history of cancer in first-degree relatives.  GYNECOLOGIC HISTORY: She is GX, P4, first pregnancy to term at age 62.  She was on the hormone replacement after her hysterectomy but only briefly and stopped when she had the initial diagnosis of breast cancer in 1996.  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  She lives with Theresa Wallace who is a retired Chief Financial Officer.  Her daughter Theresa Wallace used to be a Network engineer for Theresa Wallace, and Theresa Wallace husband, Theresa Wallace, is a [address] assistant with Dr. Durward Fortes.  The patient has 12 grandchildren and 5 great-grandchildren.   ADVANCED DIRECTIVES: In place  HEALTH MAINTENANCE: (Updated 02/26/2013) History  Substance Use Topics  . Smoking status: Former Smoker    Quit date: 05/22/1968  . Smokeless tobacco: Never Used  . Alcohol Use: Yes     Colonoscopy:  PAP: s/p remote hysterectomy  Bone density:  10/23/2011, normal  Lipid panel: Theresa Wallace, UTD   Allergies  Allergen Reactions  . Ciprofloxacin   . Epinephrine   . Tamoxifen     Current Outpatient Prescriptions  Medication Sig Dispense Refill  . aspirin 81 MG tablet Take  81 mg by mouth daily.    . Calcium Carbonate-Vitamin D (CALCIUM-VITAMIN D) 500-200 MG-UNIT per tablet Take 1 tablet by mouth 2 (two) times daily with a meal.    . cholecalciferol (VITAMIN D) 400 UNITS TABS Take 2,000 Units by mouth daily.    Marland Kitchen exemestane (AROMASIN) 25 MG tablet 1 tab by mouth daily 90 tablet 3  . FINACEA 15 % cream     . Glucosamine HCl-MSM (GLUCOSAMINE-MSM PO) Take 1 tablet by mouth daily.    Marland Kitchen levothyroxine (SYNTHROID, LEVOTHROID) 25 MCG tablet Take 25 mcg by mouth daily.    Marland Kitchen losartan (COZAAR) 50 MG tablet     . Multiple Vitamin (MULTIVITAMIN) tablet Take 1 tablet by mouth daily.    Marland Kitchen NEXIUM 40 MG capsule     . niacin (NIASPAN) 500 MG CR tablet Take 500 mg by mouth at bedtime.    . propranolol ER (INDERAL LA) 60 MG 24 hr capsule      No current facility-administered medications for this visit.    OBJECTIVE: Filed Vitals:   09/07/14 1135  BP: 188/61  Pulse:   Temp:   Resp:    Body mass  index is 28.63 kg/(m^2).  Elderly white Wallace in no acute distress  Sclerae unicteric, EOMs intact Oropharynx clear, slightly dry No cervical or supraclavicular adenopathy Lungs no rales or rhonchi Heart regular rate and rhythm Abd soft, nontender, positive bowel sounds, no masses palpated MSK no focal spinal tenderness, no upper extremity lymphedema Neuro: nonfocal, well oriented, positive affect Breasts: The right breast is unremarkable. The left breast is status post lumpectomy and radiation. There are posttreatment changes but no evidence of local recurrence. The left axilla is benign.    LAB RESULTS: Lab Results  Component Value Date   WBC 8.9 08/31/2014   NEUTROABS 5.9 08/31/2014   HGB 12.5 08/31/2014   HCT 39.3 08/31/2014   MCV 86.3 08/31/2014   PLT 204 08/31/2014      Chemistry      Component Value Date/Time   NA 142 08/31/2014 1058   NA 133* 11/13/2011 0955   NA 142 08/30/2011 1253   K 5.0 08/31/2014 1058   K 4.4 11/13/2011 0955   K 4.2 08/30/2011  1253   CL 104 10/24/2012 1014   CL 98 11/13/2011 0955   CL 101 08/30/2011 1253   CO2 28 08/31/2014 1058   CO2 29 11/13/2011 0955   CO2 27 08/30/2011 1253   BUN 24.4 08/31/2014 1058   BUN 20 11/13/2011 0955   BUN 17 08/30/2011 1253   CREATININE 1.1 08/31/2014 1058   CREATININE 0.91 11/13/2011 0955   CREATININE 1.0 08/30/2011 1253      Component Value Date/Time   CALCIUM 9.3 08/31/2014 1058   CALCIUM 8.9 11/13/2011 0955   CALCIUM 9.1 08/30/2011 1253   ALKPHOS 49 08/31/2014 1058   ALKPHOS 49 11/13/2011 0955   ALKPHOS 45 08/30/2011 1253   AST 14 08/31/2014 1058   AST 17 11/13/2011 0955   AST 25 08/30/2011 1253   ALT 16 08/31/2014 1058   ALT 30 11/13/2011 0955   ALT 23 08/30/2011 1253   BILITOT 1.04 08/31/2014 1058   BILITOT 1.1 11/13/2011 0955   BILITOT 1.20 08/30/2011 1253      STUDIES: Dg Chest 2 View  08/31/2014   CLINICAL DATA:  Metastatic breast cancer, followup, hypertension, former smoker  EXAM: CHEST  2 VIEW  COMPARISON:  03/05/2014  FINDINGS: Enlargement of cardiac silhouette.  Atherosclerotic calcification aorta.  Mediastinal contours and pulmonary vascularity otherwise normal.  Lungs appear hyperinflated with minimal subsegmental atelectasis at the costophrenic angles.  Remaining lungs clear.  No pleural effusion or pneumothorax.  Bones appear demineralized.  IMPRESSION: Enlargement of cardiac silhouette.  Bibasilar atelectasis and questionable underlying emphysematous changes.   Electronically Signed   By: Mark  Boles M.D.   On: 08/31/2014 12:12    ASSESSMENT: 81 y.o. [place] Wallace:   (1) Status post left lumpectomy in 1996 for a T1b N0 grade 2 invasive ductal carcinoma which was strongly estrogen and progesterone receptor positive, treated with radiation, then anastrozole, then Evista, all treatment discontinued July 2012  (2) recurrence to the lining of the left lung and liver documented by liver biopsy July 2012, showing metastatic adenocarcinoma which was  estrogen and progesterone receptor positive at 96% and 97% respectively; there was no Her-2 amplification.   (3)  She has been on exemestane since early July of 2012, with chest CT scan April 2013 showing complete resolution of her lung and liver metastases  (a) bone density in June of 08/09/2011 was normal  (4) MRI of the lumbar spine 09-2012 showed significant degenerative disease but no evidence of   metastatic disease to bone.  PLAN:  From a breast cancer point of view Ren's doing very well. Her chest x-ray and liver function tests do not indicate disease progression. The plan is to continue exemestane until we have evidence of progression and specifically symptomatic progression.  I wrote her a handwritten prescription for the exemestane so she can shop at around your she is paying approximately $100 for 3 months at this point.  She is having some disagreements with [name] which she thinks may be an indication of early Alzheimer's on his side. I suggested she read "the 36 hour day", but it doesn't seem like it as major as all that from her description.  She will see me again in 6 months and we will repeat labs and a chest x-ray at that time. I would not repeat a chest CT until we have specific symptoms or signs to evaluate and are contemplating changing therapy    Olena Willy C    09/06/2014

## 2014-09-07 ENCOUNTER — Ambulatory Visit (HOSPITAL_BASED_OUTPATIENT_CLINIC_OR_DEPARTMENT_OTHER): Payer: Medicare Other | Admitting: Oncology

## 2014-09-07 VITALS — BP 188/61 | HR 62 | Temp 97.7°F | Resp 18 | Ht 63.0 in | Wt 161.6 lb

## 2014-09-07 DIAGNOSIS — C50912 Malignant neoplasm of unspecified site of left female breast: Secondary | ICD-10-CM

## 2014-09-07 DIAGNOSIS — R32 Unspecified urinary incontinence: Secondary | ICD-10-CM | POA: Diagnosis not present

## 2014-09-07 DIAGNOSIS — R51 Headache: Secondary | ICD-10-CM | POA: Diagnosis not present

## 2014-09-07 DIAGNOSIS — J438 Other emphysema: Secondary | ICD-10-CM

## 2014-09-07 DIAGNOSIS — C50919 Malignant neoplasm of unspecified site of unspecified female breast: Secondary | ICD-10-CM

## 2014-09-07 DIAGNOSIS — J449 Chronic obstructive pulmonary disease, unspecified: Secondary | ICD-10-CM | POA: Insufficient documentation

## 2014-09-07 MED ORDER — EXEMESTANE 25 MG PO TABS: ORAL_TABLET | ORAL | Status: DC

## 2014-12-01 ENCOUNTER — Encounter: Payer: Self-pay | Admitting: Genetic Counselor

## 2014-12-18 ENCOUNTER — Telehealth: Payer: Self-pay | Admitting: Genetic Counselor

## 2014-12-18 ENCOUNTER — Encounter: Payer: Self-pay | Admitting: Genetic Counselor

## 2014-12-18 DIAGNOSIS — Z1379 Encounter for other screening for genetic and chromosomal anomalies: Secondary | ICD-10-CM | POA: Insufficient documentation

## 2014-12-18 NOTE — Telephone Encounter (Signed)
Patient is calling about her recall letter.  We discussed that she will be 89 at her next birthday and she is negative on her BRCA result.  Patient is jewish, and has a FH of post menopausal breast cancer in her sister and mother.  Other family members have been tested (she is not sure what they were tested for) and were negative.  I explained that most individuals with AJ heritage and personal and family history of breast cancer who are negative for BRCA mutations have tested for the majority of their risk.  There are some individuals who may test positive for a mutation in a different gene.  Based on her [place] and FH of post menopausal breast cancer, the chances are lower than 10% that she would test positive for a hereditary breast cancer syndrome, and most likely would be lower than that.  She should talk with her family members to see if they have additional concerns.  If they do, we can revisit testing.  If they do not, then we do not have to pursue genetic testing.

## 2014-12-25 ENCOUNTER — Encounter (HOSPITAL_COMMUNITY): Payer: Self-pay

## 2015-01-05 ENCOUNTER — Other Ambulatory Visit: Payer: Self-pay | Admitting: Oncology

## 2015-03-09 ENCOUNTER — Ambulatory Visit (HOSPITAL_COMMUNITY)
Admission: RE | Admit: 2015-03-09 | Discharge: 2015-03-09 | Disposition: A | Discharge status: Home / Self Care | Payer: Medicare Other | Source: Ambulatory Visit | Attending: Oncology | Admitting: Oncology

## 2015-03-09 ENCOUNTER — Other Ambulatory Visit: Payer: Self-pay

## 2015-03-09 ENCOUNTER — Other Ambulatory Visit (HOSPITAL_BASED_OUTPATIENT_CLINIC_OR_DEPARTMENT_OTHER): Payer: Medicare Other

## 2015-03-09 DIAGNOSIS — C50912 Malignant neoplasm of unspecified site of left female breast: Secondary | ICD-10-CM

## 2015-03-09 DIAGNOSIS — J439 Emphysema, unspecified: Secondary | ICD-10-CM | POA: Diagnosis not present

## 2015-03-09 DIAGNOSIS — J438 Other emphysema: Secondary | ICD-10-CM

## 2015-03-09 DIAGNOSIS — J984 Other disorders of lung: Secondary | ICD-10-CM | POA: Diagnosis not present

## 2015-03-09 DIAGNOSIS — C50919 Malignant neoplasm of unspecified site of unspecified female breast: Secondary | ICD-10-CM | POA: Diagnosis present

## 2015-03-09 LAB — CBC WITH DIFFERENTIAL/PLATELET
BASO%: 0.6 % (ref 0.0–2.0)
BASOS ABS: 0 10*3/uL (ref 0.0–0.1)
EOS ABS: 0.3 10*3/uL (ref 0.0–0.5)
EOS%: 3.9 % (ref 0.0–7.0)
HCT: 38.2 % (ref 34.8–46.6)
HGB: 12.6 g/dL (ref 11.6–15.9)
LYMPH%: 18.9 % (ref 14.0–49.7)
MCH: 28.8 pg (ref 25.1–34.0)
MCHC: 32.9 g/dL (ref 31.5–36.0)
MCV: 87.4 fL (ref 79.5–101.0)
MONO#: 0.5 10*3/uL (ref 0.1–0.9)
MONO%: 6.6 % (ref 0.0–14.0)
NEUT#: 4.9 10*3/uL (ref 1.5–6.5)
NEUT%: 70 % (ref 38.4–76.8)
Platelets: 207 10*3/uL (ref 145–400)
RBC: 4.37 10*6/uL (ref 3.70–5.45)
RDW: 13.6 % (ref 11.2–14.5)
WBC: 7 10*3/uL (ref 3.9–10.3)
lymph#: 1.3 10*3/uL (ref 0.9–3.3)

## 2015-03-09 LAB — COMPREHENSIVE METABOLIC PANEL (CC13)
ALK PHOS: 54 U/L (ref 40–150)
ALT: 15 U/L (ref 0–55)
ANION GAP: 9 meq/L (ref 3–11)
AST: 15 U/L (ref 5–34)
Albumin: 3.8 g/dL (ref 3.5–5.0)
BUN: 19.7 mg/dL (ref 7.0–26.0)
CO2: 27 meq/L (ref 22–29)
Calcium: 9.6 mg/dL (ref 8.4–10.4)
Chloride: 103 mEq/L (ref 98–109)
Creatinine: 1.2 mg/dL — ABNORMAL HIGH (ref 0.6–1.1)
EGFR: 39 mL/min/{1.73_m2} — AB (ref >90)
Glucose: 113 mg/dl (ref 70–140)
Potassium: 5 mEq/L (ref 3.5–5.1)
SODIUM: 138 meq/L (ref 136–145)
Total Bilirubin: 0.86 mg/dL (ref 0.20–1.20)
Total Protein: 6.7 g/dL (ref 6.4–8.3)

## 2015-03-16 ENCOUNTER — Telehealth: Payer: Self-pay | Admitting: Oncology

## 2015-03-16 ENCOUNTER — Ambulatory Visit (HOSPITAL_BASED_OUTPATIENT_CLINIC_OR_DEPARTMENT_OTHER): Payer: Medicare Other | Admitting: Oncology

## 2015-03-16 VITALS — BP 179/85 | HR 66 | Temp 97.5°F | Resp 18 | Ht 63.0 in | Wt 165.0 lb

## 2015-03-16 DIAGNOSIS — C50919 Malignant neoplasm of unspecified site of unspecified female breast: Secondary | ICD-10-CM

## 2015-03-16 DIAGNOSIS — C50912 Malignant neoplasm of unspecified site of left female breast: Secondary | ICD-10-CM

## 2015-03-16 MED ORDER — EXEMESTANE 25 MG PO TABS: ORAL_TABLET | ORAL | Status: DC

## 2015-03-16 NOTE — Progress Notes (Signed)
ID: Theresa Wallace   DOB: 07/26/1923  MR#: [id number]  CSN#:[id number]  PCP: GATES,ROBERT NEVILL, MD GYN: SU:  OTHER MD:  CHIEF COMPLAINT:  Metastatic Breast Cancer  CURRENT TREATMENT: Exemestane   HISTORY OF PRESENT ILLNESS: From the prior summary:  Theresa Wallace is an 81 y.o.  [place] woman I saw briefly when she moved to this area in 2007.  She had had a left breast cancer removed at [facility] in [place] in 1996 ([id number]).  It was grade 2, measured 1 cm maximally and was strongly estrogen and progesterone-receptor positive.  HER-2 was not checked.  This was T1b NX invasive ductal carcinoma, stage I, and she was treated after radiation with anastrozole for several years, eventually switching to Evista primarily for cost reasons.   More recently, she presented to Dr. Gates with fatigue and a dry cough.  He auscultated dullness to percussion at the left base and obtained a chest x-ray which confirmed the presence of a left pleural effusion.   Dr. Gates scheduled Theresa Wallace for an ultrasound-guided left thoracentesis, and this was performed November 09, 2010.  Approximately 1 L of amber-colored fluid was removed.  Cytology from this procedure ([id number]) showed only reactive mesothelial cells.   The patient was then referred here and set up for a CT of the chest and a PET scan.  The CT of the chest on June 20th showed 2 slightly enlarged left axillary lymph nodes with some irregular contours but more importantly multiple pleural nodules in the left chest measuring up to 4.6 cm.  The right chest was normal.  There was no pericardial effusion.  There was only a small to moderate left pleural effusion remaining, and aside from left lower lobe collapse or consolidation, there was no evidence of lung parenchymal involvement.  There was no evidence of bone involvement and also no calcified pleural plaques and no worrisome bone involvement.  PET scan on June 27th showed the nodularity  along the left pleura to be intensely hypermetabolic.  For example the large lobe nodule measuring 4.5 cm had an SUV max of 12.9.  There was no hypermetabolic mediastinal nodal activity.  The to left axillary lymph nodes were very mildly hypermetabolic.  The left breast was clear.  There was 1 hypermetabolic lesion in the liver measuring 3.2 cm.  On June 27th, Dr. Bertrand performed fine-needle aspiration of a 7 mm abnormal-appearing lymph node in the lower left axilla.  The cytology from this procedure, however, ([name]) also was inconclusive showing no evidence of nodal tissue, mostly blood and fatty tissue.   Her subsequent history is as detailed below  INTERVAL HISTORY: Theresa Wallace returns today for followup of her metastatic breast cancer accompanied by her SO Theresa Wallace. She continues on exemestane, which she is currently buying at [employer]. The Price may vary between 6500 $100 for 3 month supply. She doesn't have problems with hot flashes or vaginal dryness associated with this. She feels fatigued and thinks possibly the exemestane may contribute to that  REVIEW OF SYSTEMS: Theresa Wallace remains very active for her age (she will turn 89 April). Most days she does not take naps. She is always "on the go". She has some knee problems, has problems hearing, some issues relating to her teeth, and she tells me that Theresa Wallace is "getting old" (at 89", but that most of the time she gives in her hand. Asked what the most strenuous think she is done in the last week she says it was playing   bridge. A detailed review of systems today was otherwise stable  PAST MEDICAL HISTORY: Past Medical History  Diagnosis Date  . Hypertension   . Cancer     breast ca  . Breast cancer metastasized to multiple sites 06/07/2011  1. Hypercholesterolemia. 2. Benign tremor. 3. Sigmoid diverticulosis. 4. Onychomycosis. 5. Irritable bowel syndrome. 6. GERD. 7. History of palpitations with Holter monitor in 2008 showing PACs. 8. History of  hysterectomy with no salpingo-oophorectomy. 9. History of cholecystectomy. 10. History of bilateral knee arthroscopic surgery. History of hemorrhoid surgery.  FAMILY HISTORY [name] died in an automobile accident.  [name] died with "bone cancer."  The patient had 3 sisters.  One had breast cancer develop in her 61s.  There is also a cousin with breast cancer but no history of ovarian cancer or other history of cancer in first-degree relatives.  GYNECOLOGIC HISTORY: She is GX, P4, first pregnancy to term at age 86.  She was on the hormone replacement after her hysterectomy but only briefly and stopped when she had the initial diagnosis of breast cancer in 1996.  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  She lives with Theresa Wallace who is a retired Chief Financial Officer.  Her daughter Theresa Wallace used to be a Network engineer for Dr. Inda Merlin, and [name]'s husband, Theresa Wallace, is a 2 assistant with Dr. Durward Fortes.  The patient has 12 grandchildren and 5 great-grandchildren.   ADVANCED DIRECTIVES: In place  HEALTH MAINTENANCE: (Updated 02/26/2013) Social History  Substance Use Topics  . Smoking status: Former Smoker    Quit date: 05/22/1968  . Smokeless tobacco: Never Used  . Alcohol Use: Yes     Colonoscopy:  PAP: s/p remote hysterectomy  Bone density:  10/23/2011, normal  Lipid panel: Dr. Inda Merlin, UTD   Allergies  Allergen Reactions  . Ciprofloxacin   . Epinephrine   . Tamoxifen   . Mupirocin Rash    Current Outpatient Prescriptions  Medication Sig Dispense Refill  . aspirin 81 MG tablet Take 81 mg by mouth daily.    . Calcium Carbonate-Vitamin D (CALCIUM-VITAMIN D) 500-200 MG-UNIT per tablet Take 1 tablet by mouth 2 (two) times daily with a meal.    . cholecalciferol (VITAMIN D) 400 UNITS TABS Take 2,000 Units by  mouth daily.    Marland Kitchen exemestane (AROMASIN) 25 MG tablet 1 tab by mouth daily 90 tablet 3  . FINACEA 15 % cream     . Glucosamine HCl-MSM (GLUCOSAMINE-MSM PO) Take 1 tablet by mouth daily.    Marland Kitchen levothyroxine (SYNTHROID, LEVOTHROID) 25 MCG tablet Take 25 mcg by mouth daily.    Marland Kitchen losartan (COZAAR) 50 MG tablet     . Multiple Vitamin (MULTIVITAMIN) tablet Take 1 tablet by mouth daily.    Marland Kitchen NEXIUM 40 MG capsule     . propranolol ER (INDERAL LA) 60 MG 24 hr capsule      No current facility-administered medications for this visit.    OBJECTIVE: Older white woman who appears stated age 68 Vitals:   03/16/15 1043  BP: 179/85  Pulse: 66  Temp: 97.5 F (36.4 C)  Resp: 18   Body mass index is 29.24 kg/(m^2).  Sclerae unicteric, pupils round and equal; left ptosis as previously noted Oropharynx clear and moist-- no thrush or other lesions No cervical or supraclavicular adenopathy Lungs no rales or rhonchi Heart regular rate  and rhythm Abd soft, nontender, positive bowel sounds MSK no focal spinal tenderness, no upper extremity lymphedema Neuro: nonfocal, well oriented, appropriate affect Breasts: The right breast is unremarkable. The left breast is status post lumpectomy. There is no evidence of local recurrence. The left axilla is benign   LAB RESULTS: Lab Results  Component Value Date   WBC 7.0 03/09/2015   NEUTROABS 4.9 03/09/2015   HGB 12.6 03/09/2015   HCT 38.2 03/09/2015   MCV 87.4 03/09/2015   PLT 207 03/09/2015      Chemistry      Component Value Date/Time   NA 138 03/09/2015 1006   NA 133* 11/13/2011 0955   NA 142 08/30/2011 1253   K 5.0 03/09/2015 1006   K 4.4 11/13/2011 0955   K 4.2 08/30/2011 1253   CL 104 10/24/2012 1014   CL 98 11/13/2011 0955   CL 101 08/30/2011 1253   CO2 27 03/09/2015 1006   CO2 29 11/13/2011 0955   CO2 27 08/30/2011 1253   BUN 19.7 03/09/2015 1006   BUN 20 11/13/2011 0955   BUN 17 08/30/2011 1253   CREATININE 1.2* 03/09/2015 1006    CREATININE 0.91 11/13/2011 0955   CREATININE 1.0 08/30/2011 1253      Component Value Date/Time   CALCIUM 9.6 03/09/2015 1006   CALCIUM 8.9 11/13/2011 0955   CALCIUM 9.1 08/30/2011 1253   ALKPHOS 54 03/09/2015 1006   ALKPHOS 49 11/13/2011 0955   ALKPHOS 45 08/30/2011 1253   AST 15 03/09/2015 1006   AST 17 11/13/2011 0955   AST 25 08/30/2011 1253   ALT 15 03/09/2015 1006   ALT 30 11/13/2011 0955   ALT 23 08/30/2011 1253   BILITOT 0.86 03/09/2015 1006   BILITOT 1.1 11/13/2011 0955   BILITOT 1.20 08/30/2011 1253      STUDIES: Dg Chest 2 View  03/09/2015  CLINICAL DATA:  History of breast cancer. Evaluate for metastatic disease. Emphysema. EXAM: CHEST  2 VIEW COMPARISON:  08/31/2014 FINDINGS: Minimal linear densities in the lung bases, likely scarring. Lungs otherwise clear. Heart is normal size. No effusions. No acute bony abnormality. IMPRESSION: Bibasilar scarring.  No active disease. Electronically Signed   By: Rolm Baptise M.D.   On: 03/09/2015 12:49    ASSESSMENT: 81 y.o. [place] woman:   (1) Status post left lumpectomy in 1996 for a T1b N0 grade 2 invasive ductal carcinoma which was strongly estrogen and progesterone receptor positive, treated with radiation, then anastrozole, then Evista, all treatment discontinued July 2012  (2) recurrence to the lining of the left lung and liver documented by liver biopsy July 2012, showing metastatic adenocarcinoma which was estrogen and progesterone receptor positive at 96% and 97% respectively; there was no Her-2 amplification.   (3)  She has been on exemestane since early July of 2012, with chest CT scan April 2013 showing complete resolution of her lung and liver metastases  (a) bone density in June of 08/09/2011 was normal  (4) MRI of the lumbar spine 09-2012 showed significant degenerative disease but no evidence of metastatic disease to bone.  PLAN:  Charleen continues to exhibit no symptoms from her metastatic breast cancer.  She is tolerating the exemestane well. The plan is to continue the exemestane until there is evidence of clinical progression.  She is going to see me again in April (her birthday as 09/05/2013, when she will turned 43) and we will do lab work and a chest x-ray prior to that visit.  She knows  to call for any problems that may develop before that.    Emersen Mascari C    03/16/2015

## 2015-03-16 NOTE — Telephone Encounter (Signed)
Appointments made and avs printed for patient °

## 2015-08-11 ENCOUNTER — Telehealth: Payer: Self-pay | Admitting: Oncology

## 2015-08-11 NOTE — Telephone Encounter (Signed)
pt called to confirm appts...she had a hard time hearing but repeated all appt back correctly.

## 2015-08-23 DIAGNOSIS — H903 Sensorineural hearing loss, bilateral: Secondary | ICD-10-CM | POA: Insufficient documentation

## 2015-08-23 DIAGNOSIS — H6123 Impacted cerumen, bilateral: Secondary | ICD-10-CM | POA: Insufficient documentation

## 2015-09-06 ENCOUNTER — Other Ambulatory Visit: Payer: Self-pay | Admitting: Oncology

## 2015-09-06 ENCOUNTER — Other Ambulatory Visit (HOSPITAL_BASED_OUTPATIENT_CLINIC_OR_DEPARTMENT_OTHER): Payer: Medicare Other

## 2015-09-06 ENCOUNTER — Ambulatory Visit (HOSPITAL_COMMUNITY)
Admission: RE | Admit: 2015-09-06 | Discharge: 2015-09-06 | Disposition: A | Discharge status: Home / Self Care | Payer: Medicare Other | Source: Ambulatory Visit | Attending: Oncology | Admitting: Oncology

## 2015-09-06 DIAGNOSIS — J438 Other emphysema: Secondary | ICD-10-CM

## 2015-09-06 DIAGNOSIS — C50919 Malignant neoplasm of unspecified site of unspecified female breast: Secondary | ICD-10-CM | POA: Diagnosis present

## 2015-09-06 DIAGNOSIS — R918 Other nonspecific abnormal finding of lung field: Secondary | ICD-10-CM | POA: Diagnosis not present

## 2015-09-06 LAB — CBC WITH DIFFERENTIAL/PLATELET
BASO%: 0.3 % (ref 0.0–2.0)
Basophils Absolute: 0 10*3/uL (ref 0.0–0.1)
EOS%: 3.7 % (ref 0.0–7.0)
Eosinophils Absolute: 0.3 10*3/uL (ref 0.0–0.5)
HCT: 39.1 % (ref 34.8–46.6)
HGB: 12.8 g/dL (ref 11.6–15.9)
LYMPH%: 23.4 % (ref 14.0–49.7)
MCH: 28.6 pg (ref 25.1–34.0)
MCHC: 32.7 g/dL (ref 31.5–36.0)
MCV: 87.3 fL (ref 79.5–101.0)
MONO#: 0.5 10*3/uL (ref 0.1–0.9)
MONO%: 6.6 % (ref 0.0–14.0)
NEUT#: 4.8 10*3/uL (ref 1.5–6.5)
NEUT%: 66 % (ref 38.4–76.8)
Platelets: 189 10*3/uL (ref 145–400)
RBC: 4.48 10*6/uL (ref 3.70–5.45)
RDW: 14 % (ref 11.2–14.5)
WBC: 7.3 10*3/uL (ref 3.9–10.3)
lymph#: 1.7 10*3/uL (ref 0.9–3.3)

## 2015-09-06 LAB — COMPREHENSIVE METABOLIC PANEL
ALT: 23 U/L (ref 0–55)
AST: 22 U/L (ref 5–34)
Albumin: 3.6 g/dL (ref 3.5–5.0)
Alkaline Phosphatase: 63 U/L (ref 40–150)
Anion Gap: 7 mEq/L (ref 3–11)
BILIRUBIN TOTAL: 0.78 mg/dL (ref 0.20–1.20)
BUN: 18.2 mg/dL (ref 7.0–26.0)
CO2: 27 meq/L (ref 22–29)
Calcium: 9.7 mg/dL (ref 8.4–10.4)
Chloride: 104 mEq/L (ref 98–109)
Creatinine: 1.2 mg/dL — ABNORMAL HIGH (ref 0.6–1.1)
EGFR: 38 mL/min/{1.73_m2} — AB (ref >90)
GLUCOSE: 82 mg/dL (ref 70–140)
Potassium: 4.7 mEq/L (ref 3.5–5.1)
SODIUM: 138 meq/L (ref 136–145)
TOTAL PROTEIN: 6.9 g/dL (ref 6.4–8.3)

## 2015-09-13 ENCOUNTER — Telehealth: Payer: Self-pay | Admitting: Oncology

## 2015-09-13 ENCOUNTER — Ambulatory Visit (HOSPITAL_BASED_OUTPATIENT_CLINIC_OR_DEPARTMENT_OTHER): Payer: Medicare Other | Admitting: Oncology

## 2015-09-13 VITALS — BP 121/75 | HR 65 | Temp 97.7°F | Resp 17 | Wt 160.4 lb

## 2015-09-13 DIAGNOSIS — C50912 Malignant neoplasm of unspecified site of left female breast: Secondary | ICD-10-CM | POA: Diagnosis not present

## 2015-09-13 DIAGNOSIS — C50919 Malignant neoplasm of unspecified site of unspecified female breast: Secondary | ICD-10-CM

## 2015-09-13 MED ORDER — EXEMESTANE 25 MG PO TABS: ORAL_TABLET | ORAL | Status: DC

## 2015-09-13 NOTE — Progress Notes (Signed)
ID: Theresa Wallace   DOB: [birth year]  MR#: [id number]  ERX#:[id number]  PCP: Theresa Screws, MD GYN: SU:  OTHER MD:  CHIEF COMPLAINT:  Metastatic Breast Cancer  CURRENT TREATMENT: Exemestane   HISTORY OF PRESENT ILLNESS: From the prior summary:  Theresa Wallace is an 82 y.o.  Theresa Wallace I saw briefly when she moved to this area in 2007.  She had had a left breast cancer removed at [facility] in [place] in [phone] (Theresa Wallace).  It was grade 2, measured 1 cm maximally and was strongly estrogen and progesterone-receptor positive.  HER-2 was not checked.  This was T1b NX invasive ductal carcinoma, stage I, and she was treated after radiation with anastrozole for several years, eventually switching to Evista primarily for cost reasons.   More recently, she presented to Dr. Inda Wallace with fatigue and a dry cough.  He auscultated dullness to percussion at the left base and obtained a chest x-ray which confirmed the presence of a left pleural effusion.   Dr. Inda Wallace scheduled Theresa Wallace for an ultrasound-guided left thoracentesis, and this was performed November 09, 2010.  Approximately 1 L of amber-colored fluid was removed.  Cytology from this procedure ([id number]) showed only reactive mesothelial cells.   The patient was then referred here and set up for a CT of the chest and a PET scan.  The CT of the chest on June 20th showed 2 slightly enlarged left axillary lymph nodes with some irregular contours but more importantly multiple pleural nodules in the left chest measuring up to 4.6 cm.  The right chest was normal.  There was no pericardial effusion.  There was only a small to moderate left pleural effusion remaining, and aside from left lower lobe collapse or consolidation, there was no evidence of lung parenchymal involvement.  There was no evidence of bone involvement and also no calcified pleural plaques and no worrisome bone involvement.  PET scan on June 27th showed the nodularity  along the left pleura to be intensely hypermetabolic.  For example the large lobe nodule measuring 4.5 cm had an SUV max of 12.9.  There was no hypermetabolic mediastinal nodal activity.  The to left axillary lymph nodes were very mildly hypermetabolic.  The left breast was clear.  There was 1 hypermetabolic lesion in the liver measuring 3.2 cm.  On June 27th, Dr. Isaiah Wallace performed fine-needle aspiration of a 7 mm abnormal-appearing lymph node in the lower left axilla.  The cytology from this procedure, however, ([name]) also was inconclusive showing no evidence of nodal tissue, mostly blood and fatty tissue.   Her subsequent history is as detailed below  INTERVAL HISTORY: Theresa Wallace returns today for followup of her estrogen receptor positive, stage IV breast cancer accompanied by her significant other, Theresa Wallace. She continues on exemestane. She is tolerating it remarkably well. She obtains it at a good price.  REVIEW OF SYSTEMS: Theresa Wallace occasionally gets on her exercise bike. She tells me she has to take care of Theresa Wallace 24 /7, that he doesn't like her to be out of his sight, and that sometimes they have arguments. Overall though things seem to be going well. She had her 82th birthday last week and is planning to have a party in the next month or so. Sometimes she has a shooting pain up her right face, which last a few seconds. This is rare. She is seeing better since she had her cataract surgery. A detailed review of systems today was otherwise noncontributory  PAST MEDICAL  HISTORY: Past Medical History  Diagnosis Date  . Hypertension   . Cancer     breast ca  . Breast cancer metastasized to multiple sites 06/07/2011  1. Hypercholesterolemia. 2. Benign tremor. 3. Sigmoid diverticulosis. 4. Onychomycosis. 5. Irritable bowel syndrome. 6. GERD. 7. History of palpitations with Holter monitor in 2008 showing PACs. 8. History of hysterectomy with no salpingo-oophorectomy. 9. History of  cholecystectomy. 10. History of bilateral knee arthroscopic surgery. History of hemorrhoid surgery.  FAMILY HISTORY [name] died in an automobile accident.  [name] died with "bone cancer."  The patient had 3 sisters.  One had breast cancer develop in her 69s.  There is also a cousin with breast cancer but no history of ovarian cancer or other history of cancer in first-degree relatives.  GYNECOLOGIC HISTORY: She is GX, P4, first pregnancy to term at age 28.  She was on the hormone replacement after her hysterectomy but only briefly and stopped when she had the initial diagnosis of breast cancer in 1996.  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  She lives with Theresa Wallace who is a retired Chief Financial Officer.  Her daughter Theresa Wallace used to be a Network engineer for Dr. Inda Wallace, and Theresa Wallace's husband, Theresa Wallace, is a [address] assistant with Dr. Durward Wallace.  The patient has 12 grandchildren and 5 great-grandchildren. Incidentally Theresa Wallace has a son in [place].   ADVANCED DIRECTIVES: In place  HEALTH MAINTENANCE: (Updated 02/26/2013) Social History  Substance Use Topics  . Smoking status: Former Smoker    Quit date: 05/22/1968  . Smokeless tobacco: Never Used  . Alcohol Use: Yes     Colonoscopy:  PAP: s/p remote hysterectomy  Bone density:  10/23/2011, normal  Lipid panel: Dr. Inda Wallace, UTD   Allergies  Allergen Reactions  . Ciprofloxacin   . Epinephrine   . Tamoxifen   . Mupirocin Rash    Current Outpatient Prescriptions  Medication Sig Dispense Refill  . aspirin 81 MG tablet Take 81 mg by mouth daily.    . Calcium Carbonate-Vitamin D (CALCIUM-VITAMIN D) 500-200 MG-UNIT per tablet Take 1 tablet by mouth 2 (two) times daily with a meal.    . cholecalciferol (VITAMIN D) 400 UNITS TABS Take 2,000 Units by mouth daily.    Marland Kitchen  exemestane (AROMASIN) 25 MG tablet 1 tab by mouth daily 90 tablet 3  . FINACEA 15 % cream     . Glucosamine HCl-MSM (GLUCOSAMINE-MSM PO) Take 1 tablet by mouth daily.    Marland Kitchen levothyroxine (SYNTHROID, LEVOTHROID) 25 MCG tablet Take 25 mcg by mouth daily.    Marland Kitchen losartan (COZAAR) 50 MG tablet     . Multiple Vitamin (MULTIVITAMIN) tablet Take 1 tablet by mouth daily.    Marland Kitchen NEXIUM 40 MG capsule     . propranolol ER (INDERAL LA) 60 MG 24 hr capsule      No current facility-administered medications for this visit.    OBJECTIVE: Older white womanWho appears stated age 60 Vitals:   09/13/15 1006  BP: 121/75  Pulse: 65  Temp: 97.7 F (36.5 C)  Resp: 17   Body mass index is 28.42 kg/(m^2).  Sclerae unicteric, EOMs intact, mild left ptosis Oropharynx clear and moist No cervical or supraclavicular adenopathy Lungs no rales or rhonchi Heart regular rate and rhythm Abd soft, nontender, positive bowel sounds MSK no focal spinal tenderness, no upper  extremity lymphedema Neuro: nonfocal, well oriented, appropriate affect Breasts: The right breast is unremarkable. The left breast is status post remote left lumpectomy. There is no evidence of disease recurrence. The left axilla is benign.    LAB RESULTS: Lab Results  Component Value Date   WBC 7.3 09/06/2015   NEUTROABS 4.8 09/06/2015   HGB 12.8 09/06/2015   HCT 39.1 09/06/2015   MCV 87.3 09/06/2015   PLT 189 09/06/2015      Chemistry      Component Value Date/Time   NA 138 09/06/2015 1059   NA 133* 11/13/2011 0955   NA 142 08/30/2011 1253   K 4.7 09/06/2015 1059   K 4.4 11/13/2011 0955   K 4.2 08/30/2011 1253   CL 104 10/24/2012 1014   CL 98 11/13/2011 0955   CL 101 08/30/2011 1253   CO2 27 09/06/2015 1059   CO2 29 11/13/2011 0955   CO2 27 08/30/2011 1253   BUN 18.2 09/06/2015 1059   BUN 20 11/13/2011 0955   BUN 17 08/30/2011 1253   CREATININE 1.2* 09/06/2015 1059   CREATININE 0.91 11/13/2011 0955   CREATININE 1.0  08/30/2011 1253      Component Value Date/Time   CALCIUM 9.7 09/06/2015 1059   CALCIUM 8.9 11/13/2011 0955   CALCIUM 9.1 08/30/2011 1253   ALKPHOS 63 09/06/2015 1059   ALKPHOS 49 11/13/2011 0955   ALKPHOS 45 08/30/2011 1253   AST 22 09/06/2015 1059   AST 17 11/13/2011 0955   AST 25 08/30/2011 1253   ALT 23 09/06/2015 1059   ALT 30 11/13/2011 0955   ALT 23 08/30/2011 1253   BILITOT 0.78 09/06/2015 1059   BILITOT 1.1 11/13/2011 0955   BILITOT 1.20 08/30/2011 1253      STUDIES: Dg Chest 2 View  09/06/2015  CLINICAL DATA:  Hypertension.  History of metastatic adenocarcinoma. EXAM: CHEST  2 VIEW COMPARISON:  03/09/2015 FINDINGS: Linear bibasilar scarring, stable. Heart is normal size. No confluent opacities or effusions. No acute bony abnormality. IMPRESSION: Bibasilar scarring.  No active disease or change. Electronically Signed   By: Rolm Baptise M.D.   On: 09/06/2015 11:57    ASSESSMENT: 82 y.o. [place] Wallace:   (1) Status post left lumpectomy in 1996 for a T1b N0 grade 2 invasive ductal carcinoma which was strongly estrogen and progesterone receptor positive, treated with radiation, then anastrozole, then Evista, all treatment discontinued July 2012  METASTATIC DISEASE: (2) recurrence to the lining of the left lung and liver documented by liver biopsy July 2012, showing metastatic adenocarcinoma estrogen and progesterone receptor positive at 96% and 97% respectively; there was no Her-2 amplification.   (3)  She has been on exemestane since early July of 2012, with chest CT scan April 2013 showing complete resolution of her lung and liver metastases  (a) bone density in June of 08/09/2011 was normal  (4) MRI of the lumbar spine 09-2012 showed significant degenerative disease but no evidence of metastatic disease to bone.  PLAN:  Ceili Will soon be 5 years into exemestane. We are going to continue this most likely indefinitely, but at least an additional 5 years. The plan is for  every 6 months chest x-ray and lab work as well as the physical exam.  She gets somewhat anxious when she sees me. I'm going to refer her to our survivorship nurse practitioner or the next visit and see if she does well with that. In that case we could continue to "tag team her" in the  future.  I will set her up for a bone density before her visit with me in one year from now.  Because of her concerns regarding Theresa Wallace is dementia, I suggested she read "the 36 hour day". I also suggested she began working more closely with Theresa Wallace is son who is an Arboriculturist here in town in case things to become more difficult for  She knows to call for any problems that may develop before her next visit here  [place]udson C    09/13/2015  Very much

## 2015-09-13 NOTE — Telephone Encounter (Signed)
appt made and avs printed °

## 2016-03-15 NOTE — Progress Notes (Signed)
CLINIC:  Survivorship   REASON FOR VISIT:  Routine follow-up for history of breast cancer.   BRIEF ONCOLOGIC HISTORY:  (From Theresa Wallace last note dated 09/13/15)    INTERVAL HISTORY:  Theresa Wallace presents to the [facility] today for routine follow-up for her history of metastatic breast cancer.  She remains on exemestane, and continues to tolerate it very well. She denies any hot flashes.  She has chronic left shoulder pain. She tells me that she periodically gets cortisone injections which are helpful for her. She has joint pain and arthritis, which have not worsened since her last visit to the cancer center. She denies any new focal bone pain.  She tells me that she remains overwhelmed and quite anxious regarding the care of her partner, Theresa Wallace. She tells me that his Alzheimer's is progressing, and it is a burden on her to care for him at times. She has considered  moving into an assisted living facility, but Theresa Wallace is resistant. She tells me that she does not like to go to Alzheimer support groups, because she fears she will see either some of Theresa Wallace or his Wallace's friends at these meetings.  She reports having periodic diarrhea, "when Theresa Wallace is having a bad day and is not in a good mood."  The patient's daughter and granddaughter are helpful in caring for her, and helping provide respite for her with Theresa Wallace. However, Theresa Wallace does not like it when Theresa Wallace leaves him. He is with her today, but is waiting in the lobby of the cancer center.  She is not consistently exercising. She tells me she does have an exercise bike, but does not use it very often. Her appetite is okay; "I still try to eat even when I'm not very hungry."  She thinks she may have lost a few pounds.   REVIEW OF SYSTEMS:  Review of Systems  Constitutional: Positive for fatigue.       "I know I am tired because I don't exercising enough."  HENT:  Negative.        Wears hearing aids  Eyes: Negative.     Respiratory: Negative.   Cardiovascular: Negative.   Gastrointestinal: Positive for diarrhea.  Genitourinary: Negative.  Negative for vaginal bleeding.   Musculoskeletal: Positive for arthralgias.  Neurological: Negative.   Hematological: Negative.   Psychiatric/Behavioral: The patient is nervous/anxious.        "I sleep really well."   Breast: She wants me to examine her breasts today, as she feels the scar from her left breast surgery and wants to make sure it is normal.   A 14-point review of systems was completed and was negative, except as noted above.    PAST MEDICAL/SURGICAL HISTORY:  Past Medical History:  Diagnosis Date  . Breast cancer metastasized to multiple sites 06/07/2011  . Cancer    breast ca  . Hypertension    No past surgical history on file.   ALLERGIES:  Allergies  Allergen Reactions  . Ciprofloxacin   . Epinephrine   . Tamoxifen   . Mupirocin Rash     CURRENT MEDICATIONS:  Outpatient Encounter Prescriptions as of 03/16/2016  Medication Sig Note  . aspirin 81 MG tablet Take 81 mg by mouth daily.   . Calcium Carbonate-Vitamin D (CALCIUM-VITAMIN D) 500-200 MG-UNIT per tablet Take 1 tablet by mouth 2 (two) times daily with a meal.   . cholecalciferol (VITAMIN D) 400 UNITS TABS Take 2,000 Units by mouth daily.   Marland Kitchen exemestane (  AROMASIN) 25 MG tablet 1 tab by mouth daily   . FINACEA 15 % cream    . Glucosamine HCl-MSM (GLUCOSAMINE-MSM PO) Take 1 tablet by mouth daily.   Marland Kitchen levothyroxine (SYNTHROID, LEVOTHROID) 25 MCG tablet Take 25 mcg by mouth daily.   Marland Kitchen losartan (COZAAR) 50 MG tablet    . Multiple Vitamin (MULTIVITAMIN) tablet Take 1 tablet by mouth daily.   Marland Kitchen NEXIUM 40 MG capsule    . propranolol ER (INDERAL LA) 60 MG 24 hr capsule  02/26/2013: Received from: External Pharmacy   No facility-administered encounter medications on file as of 03/16/2016.      ONCOLOGIC FAMILY HISTORY:  Family History  Problem Relation Age of Onset  . Breast  cancer Sister 12  . Breast cancer Cousin     dx in her 45s  . Bone cancer Mother     GENETIC COUNSELING/TESTING: Reportedly BRCA negative; no formal records available for review.   SOCIAL HISTORY:  Theresa Wallace has been widowed since 2005. She currently lives with her significant other, Theresa Wallace, in [place], [place].  Theresa Wallace has a daughter who lives in [place].  Her Wallace-in-law is a PA and works with Theresa Wallace.  She has a granddaughter who works with Alzheimer's patients and has been helpful in caring for Theresa Wallace. [name] has a total of 12 grandchildren and 5 great-grandchildren. The patient is retired; she previously worked as a Network engineer. She denies any current tobacco, alcohol, or illicit drug use.  For fun, she enjoys playing bridge on Tuesdays with friends.    PHYSICAL EXAMINATION:  Vital Signs: Vitals:   03/16/16 1027  BP: (!) 152/58  Pulse: 64  Resp: 17  Temp: 98.1 F (36.7 C)   Filed Weights   03/16/16 1027  Weight: 158 lb 4.8 oz (71.8 kg)   General: Elderly female in no acute distress.  She is unaccompanied today.   HEENT: Head is normocephalic.  Pupils equal and reactive to light. Conjunctivae clear without exudate.  Sclerae anicteric. Oral mucosa is pink, moist.  Oropharynx is pink without lesions or erythema.  Lymph: No cervical, supraclavicular, or infraclavicular lymphadenopathy noted on palpation.  Cardiovascular: Regular rate and rhythm.Marland Kitchen Respiratory: Clear to auscultation bilaterally. Chest expansion symmetric; breathing non-labored.  Breast Exam:  -Left breast: There is diffuse scar tissue/fibrosis to the left breast, particularly at previous lumpectomy site.  No palpable masses concerning for malignancy; mild distortion in symmetry at previous lumpectomy site; healed scar without erythema.  -Right breast: No appreciable masses on palpation. No skin redness, thickening, or peau d'orange appearance. -Axilla: No axillary adenopathy bilaterally. There is  a likely blocked sweat gland in the right axilla; not suspicious for malignant adenopathy.  GI: Abdomen soft and round; non-tender, non-distended. Bowel sounds normoactive. No hepatosplenomegaly.   GU: Deferred.  Neuro: No focal deficits. Steady gait.  Psych: Mood and affect normal and appropriate for situation.  Extremities: No edema. Skin: Warm and dry.  LABORATORY DATA:  CBC    Component Value Date/Time   WBC 8.0 03/16/2016 1013   WBC 10.9 (H) 11/25/2010 1249   RBC 4.40 03/16/2016 1013   RBC 4.69 11/25/2010 1249   HGB 12.8 03/16/2016 1013   HCT 38.8 03/16/2016 1013   PLT 208 03/16/2016 1013   MCV 88.2 03/16/2016 1013   MCH 29.1 03/16/2016 1013   MCH 30.1 11/25/2010 1249   MCHC 33.0 03/16/2016 1013   MCHC 34.3 11/25/2010 1249   RDW 13.4 03/16/2016 1013   LYMPHSABS 1.6 03/16/2016 1013  MONOABS 0.5 03/16/2016 1013   EOSABS 0.3 03/16/2016 1013   BASOSABS 0.0 03/16/2016 1013   CMP Latest Ref Rng & Units 03/16/2016 09/06/2015 03/09/2015  Glucose 70 - 140 mg/dl 97 82 113  BUN 7.0 - 26.0 mg/dL 24.4 18.2 19.7  Creatinine 0.6 - 1.1 mg/dL 1.2(H) 1.2(H) 1.2(H)  Sodium 136 - 145 mEq/L 141 138 138  Potassium 3.5 - 5.1 mEq/L 4.2 4.7 5.0  Chloride 98 - 107 mEq/L - - -  CO2 22 - 29 mEq/L 27 27 27   Calcium 8.4 - 10.4 mg/dL 9.9 9.7 9.6  Total Protein 6.4 - 8.3 g/dL 7.1 6.9 6.7  Total Bilirubin 0.20 - 1.20 mg/dL 1.21(H) 0.78 0.86  Alkaline Phos 40 - 150 U/L 82 63 54  AST 5 - 34 U/L 28 22 15   ALT 0 - 55 U/L 24 23 15     *Labs reviewed by this provider and are largely stable.    DIAGNOSTIC IMAGING:  Most recent mammogram: 02/14/16   Annual Chest X-ray: 03/16/16    ASSESSMENT AND PLAN:  Ms.. Stoneberg is a pleasant 82 y.o. female with Stage IV metastatic breast cancer to the liver and lung.  She was originally diagnosed with Stage IA left breast invasive ductal carcinoma, ER+, in 1996; at that time she underwent left lumpectomy and adjuvant radiation therapy.  She was on anastrozole  for a period of time and was switched to Evista.  Treatment stopped in 2012.  In 2012, she was found to have liver and lung mets consistent with metastatic breast cancer.   She was started on Exemestane in 11/2010 and remains on this therapy, with plans to continue indefinitely. She presents to the [facility] for surveillance and routine follow-up.   1. Metastatic breast cancer:  Her last CT chest in 08/30/11 showed resolution of left pleural effusion in the left lateral metastasis suggesting an excellent response to therapy; there were no findings for pulmonary metastatic disease as well as no obvious liver metastasis at that time. Ms. Solar continues to tolerate the Exemestane quite well. Per Dr. Jana Hakim, she will remain on the Exemestane indefinitely and have surveillance chest x-rays every 6 months. Her chest x-ray from today was normal. Her last mammogram at [facility] on 02/14/16 showed no mammographic evidence of malignancy. She will follow-up with Dr. Jana Hakim in 08/2016, as previously scheduled.   2. Anxiety: Ms. Wisham continues to struggle with anxiety.  We discussed the option of having some prn medications for anxiety that she could use when she got very overwhelmed.  She was not ready to try any medications today, as she does not like to take a lot of medicines, which I respect.  Today, I offered support for her situation with active listening and validation of her concerns.  We discussed different options to help her care for herself, while being a caregiver to her partner.  This would include some respite time away from being the caregiver. Participating in the [place] program may be a good activity for her to try. She is doing the best she can in her current situation and I encouraged her to let us know how we can best continue to support her.    3. Bone health:  Given Ms. Breon's age, history of breast cancer, and her current anti-estrogen therapy with exemestane, she is at risk for  bone demineralization. Her last DEXA scan was on 10/23/11 and was normal. She is overdue for bone mineral density testing, and I have placed orders today for  her to get DEXA scan done at [facility] sometime before her next appointment with Dr. Jana Hakim in 08/2016. She prefers to have the scan done soon, as she does not like to drive or be helpful when the weather is colder/increased risk of inclement weather. We will certainly do our best to accommodate her request.  4. Health maintenance and wellness promotion: Ms. Campa was encouraged to consume plenty of fruits and vegetables every day. She was also encouraged to engage in moderate to vigorous exercise most days of the week.  We discussed the [employer] program through the [facility], which I think would be a great option for her.  Her partner, Theresa Wallace, could possibly do the program with her if that made care-taking responsibilities easier for her. I gave her a brochure with instructions on who to contact if she decides to participate in the program.    Dispo:  -DEXA scan overdue; orders placed today and we will schedule to have this done sometime before her next appointment with Dr. Jana Hakim.   -Return to cancer center to see Dr. Jana Hakim in 08/2016.   A total of 30 minutes of face-to-face time was spent with this patient with greater than 50% of that time in counseling and care-coordination.   Mike Craze, NP Survivorship Program [place] [phone]   Note: PRIMARY CARE PROVIDER Henrine Screws, [place] [phone]

## 2016-03-16 ENCOUNTER — Other Ambulatory Visit (HOSPITAL_BASED_OUTPATIENT_CLINIC_OR_DEPARTMENT_OTHER): Payer: Medicare Other

## 2016-03-16 ENCOUNTER — Encounter: Payer: Self-pay | Admitting: Adult Health

## 2016-03-16 ENCOUNTER — Ambulatory Visit (HOSPITAL_BASED_OUTPATIENT_CLINIC_OR_DEPARTMENT_OTHER): Payer: Medicare Other | Admitting: Adult Health

## 2016-03-16 ENCOUNTER — Ambulatory Visit (HOSPITAL_COMMUNITY)
Admission: RE | Admit: 2016-03-16 | Discharge: 2016-03-16 | Disposition: A | Discharge status: Home / Self Care | Payer: Medicare Other | Source: Ambulatory Visit | Attending: Adult Health | Admitting: Adult Health

## 2016-03-16 VITALS — BP 152/58 | HR 64 | Temp 98.1°F | Resp 17 | Wt 158.3 lb

## 2016-03-16 DIAGNOSIS — Z780 Asymptomatic menopausal state: Secondary | ICD-10-CM

## 2016-03-16 DIAGNOSIS — C50912 Malignant neoplasm of unspecified site of left female breast: Secondary | ICD-10-CM | POA: Diagnosis not present

## 2016-03-16 DIAGNOSIS — Z79811 Long term (current) use of aromatase inhibitors: Secondary | ICD-10-CM

## 2016-03-16 DIAGNOSIS — C50919 Malignant neoplasm of unspecified site of unspecified female breast: Secondary | ICD-10-CM

## 2016-03-16 DIAGNOSIS — F419 Anxiety disorder, unspecified: Secondary | ICD-10-CM | POA: Diagnosis not present

## 2016-03-16 LAB — CBC WITH DIFFERENTIAL/PLATELET
BASO%: 0.5 % (ref 0.0–2.0)
Basophils Absolute: 0 10*3/uL (ref 0.0–0.1)
EOS%: 4.3 % (ref 0.0–7.0)
Eosinophils Absolute: 0.3 10*3/uL (ref 0.0–0.5)
HCT: 38.8 % (ref 34.8–46.6)
HGB: 12.8 g/dL (ref 11.6–15.9)
LYMPH%: 20.3 % (ref 14.0–49.7)
MCH: 29.1 pg (ref 25.1–34.0)
MCHC: 33 g/dL (ref 31.5–36.0)
MCV: 88.2 fL (ref 79.5–101.0)
MONO#: 0.5 10*3/uL (ref 0.1–0.9)
MONO%: 5.9 % (ref 0.0–14.0)
NEUT%: 69 % (ref 38.4–76.8)
NEUTROS ABS: 5.5 10*3/uL (ref 1.5–6.5)
Platelets: 208 10*3/uL (ref 145–400)
RBC: 4.4 10*6/uL (ref 3.70–5.45)
RDW: 13.4 % (ref 11.2–14.5)
WBC: 8 10*3/uL (ref 3.9–10.3)
lymph#: 1.6 10*3/uL (ref 0.9–3.3)

## 2016-03-16 LAB — COMPREHENSIVE METABOLIC PANEL
ALT: 24 U/L (ref 0–55)
AST: 28 U/L (ref 5–34)
Albumin: 3.7 g/dL (ref 3.5–5.0)
Alkaline Phosphatase: 82 U/L (ref 40–150)
Anion Gap: 8 mEq/L (ref 3–11)
BILIRUBIN TOTAL: 1.21 mg/dL — AB (ref 0.20–1.20)
BUN: 24.4 mg/dL (ref 7.0–26.0)
CHLORIDE: 106 meq/L (ref 98–109)
CO2: 27 meq/L (ref 22–29)
Calcium: 9.9 mg/dL (ref 8.4–10.4)
Creatinine: 1.2 mg/dL — ABNORMAL HIGH (ref 0.6–1.1)
EGFR: 40 mL/min/{1.73_m2} — ABNORMAL LOW (ref >90)
GLUCOSE: 97 mg/dL (ref 70–140)
Potassium: 4.2 mEq/L (ref 3.5–5.1)
Sodium: 141 mEq/L (ref 136–145)
TOTAL PROTEIN: 7.1 g/dL (ref 6.4–8.3)

## 2016-03-16 NOTE — Patient Instructions (Addendum)
Thank you so much for coming in to see me today!  I enjoyed meeting you!   -Don't forget to stop by [facility] today for your chest x-ray.  -Consider getting signed up for the [employer] program.  (see phone number on back of brochure I gave you today)  -You will see Dr. Jana Hakim in April.   -Bone density scan is scheduled for Monday 10/30 at 11:00 am at [facility].      *Do not take calcium supplement on 10/29 or 10/30 before your DEXA scan.      *Do not wear any metal below the waist. (No buttons or zippers below waist)   Feel free to call us with any questions or concerns!  Mike Craze, NP [place] [phone]

## 2016-09-07 ENCOUNTER — Other Ambulatory Visit: Payer: Self-pay | Admitting: Oncology

## 2016-09-07 DIAGNOSIS — C50919 Malignant neoplasm of unspecified site of unspecified female breast: Secondary | ICD-10-CM

## 2016-09-11 ENCOUNTER — Other Ambulatory Visit: Payer: Self-pay | Admitting: Oncology

## 2016-09-11 ENCOUNTER — Ambulatory Visit (HOSPITAL_BASED_OUTPATIENT_CLINIC_OR_DEPARTMENT_OTHER): Payer: Medicare Other | Admitting: Oncology

## 2016-09-11 ENCOUNTER — Ambulatory Visit (HOSPITAL_COMMUNITY)
Admission: RE | Admit: 2016-09-11 | Discharge: 2016-09-11 | Disposition: A | Discharge status: Home / Self Care | Payer: Medicare Other | Source: Ambulatory Visit | Attending: Oncology | Admitting: Oncology

## 2016-09-11 ENCOUNTER — Other Ambulatory Visit (HOSPITAL_BASED_OUTPATIENT_CLINIC_OR_DEPARTMENT_OTHER): Payer: Medicare Other

## 2016-09-11 VITALS — BP 193/65 | HR 62 | Temp 97.7°F | Resp 18 | Ht 63.0 in | Wt 145.7 lb

## 2016-09-11 DIAGNOSIS — C50812 Malignant neoplasm of overlapping sites of left female breast: Secondary | ICD-10-CM

## 2016-09-11 DIAGNOSIS — Z170 Estrogen receptor positive status [ER+]: Secondary | ICD-10-CM | POA: Insufficient documentation

## 2016-09-11 DIAGNOSIS — R634 Abnormal weight loss: Secondary | ICD-10-CM | POA: Diagnosis not present

## 2016-09-11 DIAGNOSIS — I700 Atherosclerosis of aorta: Secondary | ICD-10-CM | POA: Insufficient documentation

## 2016-09-11 DIAGNOSIS — C50919 Malignant neoplasm of unspecified site of unspecified female breast: Secondary | ICD-10-CM

## 2016-09-11 DIAGNOSIS — C50912 Malignant neoplasm of unspecified site of left female breast: Secondary | ICD-10-CM

## 2016-09-11 DIAGNOSIS — J90 Pleural effusion, not elsewhere classified: Secondary | ICD-10-CM | POA: Diagnosis not present

## 2016-09-11 LAB — COMPREHENSIVE METABOLIC PANEL
ALBUMIN: 4 g/dL (ref 3.5–5.0)
ALT: 28 U/L (ref 0–55)
AST: 31 U/L (ref 5–34)
Alkaline Phosphatase: 99 U/L (ref 40–150)
Anion Gap: 10 mEq/L (ref 3–11)
BILIRUBIN TOTAL: 1.28 mg/dL — AB (ref 0.20–1.20)
BUN: 21.2 mg/dL (ref 7.0–26.0)
CO2: 28 mEq/L (ref 22–29)
CREATININE: 1.2 mg/dL — AB (ref 0.6–1.1)
Calcium: 10.3 mg/dL (ref 8.4–10.4)
Chloride: 102 mEq/L (ref 98–109)
EGFR: 40 mL/min/{1.73_m2} — ABNORMAL LOW (ref >90)
GLUCOSE: 94 mg/dL (ref 70–140)
Potassium: 4.7 mEq/L (ref 3.5–5.1)
SODIUM: 140 meq/L (ref 136–145)
TOTAL PROTEIN: 7.2 g/dL (ref 6.4–8.3)

## 2016-09-11 LAB — CBC WITH DIFFERENTIAL/PLATELET
BASO%: 0.6 % (ref 0.0–2.0)
Basophils Absolute: 0 10*3/uL (ref 0.0–0.1)
EOS ABS: 0.3 10*3/uL (ref 0.0–0.5)
EOS%: 3 % (ref 0.0–7.0)
HCT: 41.3 % (ref 34.8–46.6)
HEMOGLOBIN: 13.8 g/dL (ref 11.6–15.9)
LYMPH%: 16.5 % (ref 14.0–49.7)
MCH: 29.7 pg (ref 25.1–34.0)
MCHC: 33.4 g/dL (ref 31.5–36.0)
MCV: 89 fL (ref 79.5–101.0)
MONO#: 0.5 10*3/uL (ref 0.1–0.9)
MONO%: 6 % (ref 0.0–14.0)
NEUT%: 73.9 % (ref 38.4–76.8)
NEUTROS ABS: 6.6 10*3/uL — AB (ref 1.5–6.5)
Platelets: 227 10*3/uL (ref 145–400)
RBC: 4.64 10*6/uL (ref 3.70–5.45)
RDW: 14.5 % (ref 11.2–14.5)
WBC: 8.9 10*3/uL (ref 3.9–10.3)
lymph#: 1.5 10*3/uL (ref 0.9–3.3)

## 2016-09-11 MED ORDER — EXEMESTANE 25 MG PO TABS: 25.0000 mg | ORAL_TABLET | Freq: Every day | ORAL | 3 refills | Status: DC

## 2016-09-11 NOTE — Progress Notes (Signed)
ID: Theresa Wallace Wallace   DOB: Feb 20, 1924  MR#: [id number]  LTJ#:[id number]  PCP: Theresa Wallace Screws, MD GYN: SU:  OTHER MD:  CHIEF COMPLAINT:  Metastatic Breast Cancer  CURRENT TREATMENT: Exemestane   HISTORY OF PRESENT ILLNESS: From the prior summary:  Theresa Wallace Wallace is an 83 y.o.  Young woman I saw briefly when she moved to this area in 2007.  She had had a left breast cancer removed at [facility] in [place] in [phone] ([place]).  It was grade 2, measured 1 cm maximally and was strongly estrogen and progesterone-receptor positive.  HER-2 was not checked.  This was T1b NX invasive ductal carcinoma, stage I, and she was treated after radiation with anastrozole for several years, eventually switching to Evista primarily for cost reasons.   More recently, she presented to Theresa Wallace Wallace with fatigue and a dry cough.  He auscultated dullness to percussion at the left base and obtained a chest x-ray which confirmed the presence of a left pleural effusion.   Theresa Wallace Wallace scheduled Theresa Wallace Wallace for an ultrasound-guided left thoracentesis, and this was performed November 09, 2010.  Approximately 1 L of amber-colored fluid was removed.  Cytology from this procedure ([id number]) showed only reactive mesothelial cells.   The patient was then referred here and set up for a CT of the chest and a PET scan.  The CT of the chest on June 20th showed 2 slightly enlarged left axillary lymph nodes with some irregular contours but more importantly multiple pleural nodules in the left chest measuring up to 4.6 cm.  The right chest was normal.  There was no pericardial effusion.  There was only a small to moderate left pleural effusion remaining, and aside from left lower lobe collapse or consolidation, there was no evidence of lung parenchymal involvement.  There was no evidence of bone involvement and also no calcified pleural plaques and no worrisome bone involvement.  PET scan on June 27th showed the nodularity  along the left pleura to be intensely hypermetabolic.  For example the large lobe nodule measuring 4.5 cm had an SUV max of 12.9.  There was no hypermetabolic mediastinal nodal activity.  The to left axillary lymph nodes were very mildly hypermetabolic.  The left breast was clear.  There was 1 hypermetabolic lesion in the liver measuring 3.2 cm.  On June 27th, Theresa Wallace Wallace performed fine-needle aspiration of a 7 mm abnormal-appearing lymph node in the lower left axilla.  The cytology from this procedure, however, ([name]) also was inconclusive showing no evidence of nodal tissue, mostly blood and fatty tissue.   Her subsequent history is as detailed below  INTERVAL HISTORY: Theresa Wallace Wallace returns today for follow-up of her estrogen receptor positive stage IV breast cancer accompanied by her daughter Theresa Wallace Wallace. Theresa Wallace Wallace continues on exemestane, with excellent tolerance. Hot flashes and vaginal dryness are not a major issue. She never developed the arthralgias or myalgias that many patients can experience on this medication. She obtains it at a good price.  Since her last visit here when his companion Theresa Wallace Wallace fell, broke his femur, and is now living in a nursing home permanently. There is also significant dementia. Accordingly Theresa Wallace Wallace has moved out of her home--the family did not think it was safe for her to live by herself--and she is now living with her daughter Theresa Wallace Wallace.  REVIEW OF SYSTEMS: Theresa Wallace Wallace had significant diarrhea problems which was eventually treated with Flagyl although according to Theresa Wallace Wallace a C. Difficile study was not obtained. That problem has cleared  although she still has her baseline IVS symptoms. Theresa Wallace Wallace denies unusual headaches, visual changes, nausea, vomiting or gait imbalance.she is now terrified of falling and is using a cane.she denies any cough, phlegm production or pleurisy. She has lost about 30 pounds, likely partly from the presumed C. Difficile and partly because of the changes in her life just  described. A detailed review of systems today was otherwise stable  PAST MEDICAL HISTORY: Past Medical History:  Diagnosis Date  . Breast cancer metastasized to multiple sites ([employer]) 06/07/2011  . Cancer ([place])    breast ca  . Hypertension   1. Hypercholesterolemia. 2. Benign tremor. 3. Sigmoid diverticulosis. 4. Onychomycosis. 5. Irritable bowel syndrome. 6. GERD. 7. History of palpitations with Holter monitor in 2008 showing PACs. 8. History of hysterectomy with no salpingo-oophorectomy. 9. History of cholecystectomy. 10. History of bilateral knee arthroscopic surgery. History of hemorrhoid surgery.  FAMILY HISTORY [name] died in an automobile accident.  [name] died with "bone cancer."  The patient had 3 sisters.  One had breast cancer develop in her 53s.  There is also a cousin with breast cancer but no history of ovarian cancer or other history of cancer in first-degree relatives.  GYNECOLOGIC HISTORY: She is GX, P4, first pregnancy to term at age 17.  She was on the hormone replacement after her hysterectomy but only briefly and stopped when she had the initial diagnosis of breast cancer in 1996.  SOCIAL HISTORY: (Updated April 2017) She worked as a Network engineer.  She has been widowed since 2005, her husband Theresa Wallace Wallace dying after a long illness involving strokes and a myocardial infarction.  She lives with Theresa Wallace Wallace who is a retired Chief Financial Officer.  Her daughter Theresa Wallace Wallace used to be a Network engineer for Theresa Wallace Wallace, and Theresa Wallace's husband, Theresa Wallace Wallace, is a 1 assistant with Dr. Durward Wallace.  The patient has 12 grandchildren and 5 great-grandchildren. Incidentally Theresa Wallace Wallace has a son in [place].   ADVANCED DIRECTIVES: In place  HEALTH MAINTENANCE: (Updated 02/26/2013) Social History  Substance Use Topics  . Smoking status: Former Smoker    Quit date: 05/22/1968  . Smokeless tobacco: Never Used  . Alcohol use Yes     Colonoscopy:  PAP: s/p remote  hysterectomy  Bone density:  10/23/2011, normal  Lipid panel: Theresa Wallace Wallace, UTD   Allergies  Allergen Reactions  . Ciprofloxacin   . Epinephrine   . Tamoxifen   . Mupirocin Rash    Current Outpatient Prescriptions  Medication Sig Dispense Refill  . aspirin 81 MG tablet Take 81 mg by mouth daily.    . Calcium Carbonate-Vitamin D (CALCIUM-VITAMIN D) 500-200 MG-UNIT per tablet Take 1 tablet by mouth 2 (two) times daily with a meal.    . cholecalciferol (VITAMIN D) 400 UNITS TABS Take 2,000 Units by mouth daily.    Marland Kitchen exemestane (AROMASIN) 25 MG tablet Take 1 tablet (25 mg total) by mouth daily. 90 tablet 3  . Glucosamine HCl-MSM (GLUCOSAMINE-MSM PO) Take 1 tablet by mouth daily.    Marland Kitchen levothyroxine (SYNTHROID, LEVOTHROID) 25 MCG tablet Take 25 mcg by mouth daily.    Marland Kitchen losartan (COZAAR) 50 MG tablet     . metroNIDAZOLE (METROGEL) 0.75 % gel Apply topically 2 (two) times daily. For rosacea    . Multiple Vitamin (MULTIVITAMIN) tablet Take 1 tablet by mouth daily.    . propranolol ER (INDERAL LA) 60 MG 24 hr capsule      No current facility-administered medications for this visit.  OBJECTIVE: Older white woman in no acute distress Vitals:   09/11/16 1153  BP: (!) 193/65  Pulse: 62  Resp: 18  Temp: 97.7 F (36.5 C)   Body mass index is 25.81 kg/m.  Filed Weights   09/11/16 1153  Weight: 145 lb 11.2 oz (66.1 kg)  weight a year ago was 160 pounds. It was 158 pounds in October 2017  Sclerae unicteric, pupils round and equal, left ptosis as previously noted Oropharynx clear and moist No cervical or supraclavicular adenopathy Lungs no rales or rhonchi Heart regular rate and rhythm Abd soft, nontender, positive bowel sounds MSK no focal spinal tenderness, no upper extremity lymphedema Neuro: nonfocal, generally well oriented, appropriate affect Breasts: the right breast is benign. The left breast is status post lumpectomy, with no evidence of local recurrence. Both axillae are  benign.   LAB RESULTS: Lab Results  Component Value Date   WBC 8.9 09/11/2016   NEUTROABS 6.6 (H) 09/11/2016   HGB 13.8 09/11/2016   HCT 41.3 09/11/2016   MCV 89.0 09/11/2016   PLT 227 09/11/2016      Chemistry      Component Value Date/Time   NA 140 09/11/2016 1126   K 4.7 09/11/2016 1126   CL 104 10/24/2012 1014   CO2 28 09/11/2016 1126   BUN 21.2 09/11/2016 1126   CREATININE 1.2 (H) 09/11/2016 1126      Component Value Date/Time   CALCIUM 10.3 09/11/2016 1126   ALKPHOS 99 09/11/2016 1126   AST 31 09/11/2016 1126   ALT 28 09/11/2016 1126   BILITOT 1.28 (H) 09/11/2016 1126      STUDIES: Most recent chest x-ray Mar 17, 2016 showed no evidence of active cancer. Bone density at [facility]/30/2017 was normal with a T score of -0.6  ASSESSMENT: 83 y.o. [place] woman:   (1) Status post left lumpectomy in 1996 for a T1b N0 grade 2 invasive ductal carcinoma which was strongly estrogen and progesterone receptor positive, treated with radiation, then anastrozole, then Evista, all treatment discontinued July 2012  METASTATIC DISEASE: (2) recurrence to the lining of the left lung and liver documented by liver biopsy July 2012, showing metastatic adenocarcinoma estrogen and progesterone receptor positive at 96% and 97% respectively; there was no Her-2 amplification.   (3)  She has been on exemestane since early July of 2012, with chest CT scan April 2013 showing complete resolution of her lung and liver metastases  (a) bone density in June of 08/09/2011 was normal  (b) bone density 03/20/2016 was normal with a T score of -0.6  (4) MRI of the lumbar spine 09-2012 showed significant degenerative disease but no evidence of metastatic disease to bone.  PLAN:  Nelline is now nearly 6 years out from definitive documentation of metastatic disease, with no clinicalevidence of disease activity. This is very favorable.  She continues on exemestane, with excellent tolerance. The plan is to  continue that indefinitely on until there is evidence of disease recurrence.  It is also encouraging that her bone density remains normal.  There has been a significant change in her life because of George's decline and her moving in with her daughter. I think this likely will account for her weight loss. If she continues to lose weight, develops other symptoms such as unexplained fatigue or unexplained pain, we would proceed to scans.we will obtain a chest x-ray today as we usually do. It is also reassuring that her liver function tests remain normal    MAGRINAT,GUSTAV C  09/11/2016

## 2016-09-11 NOTE — Progress Notes (Unsigned)
There has been a slight increase in the left pleural effusion and remains chest x-ray. That together with her weight drop, even though we have other possible explanations for that, leads me to repeat a chest CT. I have called her daughter and we will set this up this week.

## 2016-09-20 ENCOUNTER — Other Ambulatory Visit: Payer: Self-pay | Admitting: Oncology

## 2016-09-20 ENCOUNTER — Ambulatory Visit (HOSPITAL_COMMUNITY)
Admission: RE | Admit: 2016-09-20 | Discharge: 2016-09-20 | Disposition: A | Discharge status: Home / Self Care | Payer: Medicare Other | Source: Ambulatory Visit | Attending: Oncology | Admitting: Oncology

## 2016-09-20 ENCOUNTER — Encounter (HOSPITAL_COMMUNITY): Payer: Self-pay

## 2016-09-20 DIAGNOSIS — I2510 Atherosclerotic heart disease of native coronary artery without angina pectoris: Secondary | ICD-10-CM | POA: Diagnosis not present

## 2016-09-20 DIAGNOSIS — C50912 Malignant neoplasm of unspecified site of left female breast: Secondary | ICD-10-CM

## 2016-09-20 DIAGNOSIS — J90 Pleural effusion, not elsewhere classified: Secondary | ICD-10-CM | POA: Diagnosis not present

## 2016-09-20 DIAGNOSIS — Z170 Estrogen receptor positive status [ER+]: Secondary | ICD-10-CM | POA: Diagnosis present

## 2016-09-20 DIAGNOSIS — C787 Secondary malignant neoplasm of liver and intrahepatic bile duct: Secondary | ICD-10-CM | POA: Insufficient documentation

## 2016-09-20 DIAGNOSIS — I700 Atherosclerosis of aorta: Secondary | ICD-10-CM | POA: Insufficient documentation

## 2016-09-20 DIAGNOSIS — C50812 Malignant neoplasm of overlapping sites of left female breast: Secondary | ICD-10-CM | POA: Diagnosis present

## 2016-09-20 DIAGNOSIS — R918 Other nonspecific abnormal finding of lung field: Secondary | ICD-10-CM | POA: Insufficient documentation

## 2016-09-20 DIAGNOSIS — C7951 Secondary malignant neoplasm of bone: Secondary | ICD-10-CM | POA: Diagnosis not present

## 2016-09-20 MED ORDER — IOPAMIDOL (ISOVUE-300) INJECTION 61%
INTRAVENOUS | Status: AC
  Administered 2016-09-20: 60 mL
  Filled 2016-09-20: qty 75

## 2016-09-20 MED ORDER — IOPAMIDOL (ISOVUE-300) INJECTION 61%
INTRAVENOUS | Status: AC
  Filled 2016-09-20: qty 100

## 2016-09-20 NOTE — Progress Notes (Unsigned)
ID: Theresa Wallace   DOB: 1923/09/30  MR#: [id number]  XAJ#:[id number]  PCP: Henrine Screws, MD GYN: SU:  OTHER MD:  CHIEF COMPLAINT:  Metastatic Breast Cancer  CURRENT TREATMENT: Exemestane   HISTORY OF PRESENT ILLNESS: From the prior summary:  Theresa Wallace is an 83 y.o.  [place] woman I saw briefly when she moved to this area in 2007.  She had had a left breast cancer removed at [facility] in [place] in [phone] ([place]).  It was grade 2, measured 1 cm maximally and was strongly estrogen and progesterone-receptor positive.  HER-2 was not checked.  This was T1b NX invasive ductal carcinoma, stage I, and she was treated after radiation with anastrozole for several years, eventually switching to Evista primarily for cost reasons.   More recently, she presented to Dr. Inda Merlin with fatigue and a dry cough.  He auscultated dullness to percussion at the left base and obtained a chest x-ray which confirmed the presence of a left pleural effusion.   Dr. Inda Merlin scheduled [name] for an ultrasound-guided left thoracentesis, and this was performed November 09, 2010.  Approximately 1 L of amber-colored fluid was removed.  Cytology from this procedure ([id number]) showed only reactive mesothelial cells.   The patient was then referred here and set up for a CT of the chest and a PET scan.  The CT of the chest on June 20th showed 2 slightly enlarged left axillary lymph nodes with some irregular contours but more importantly multiple pleural nodules in the left chest measuring up to 4.6 cm.  The right chest was normal.  There was no pericardial effusion.  There was only a small to moderate left pleural effusion remaining, and aside from left lower lobe collapse or consolidation, there was no evidence of lung parenchymal involvement.  There was no evidence of bone involvement and also no calcified pleural plaques and no worrisome bone involvement.  PET scan on June 27th showed the nodularity  along the left pleura to be intensely hypermetabolic.  For example the large lobe nodule measuring 4.5 cm had an SUV max of 12.9.  There was no hypermetabolic mediastinal nodal activity.  The to left axillary lymph nodes were very mildly hypermetabolic.  The left breast was clear.  There was 1 hypermetabolic lesion in the liver measuring 3.2 cm.  On June 27th, Dr. [name] performed fine-needle aspiration of a 7 mm abnormal-appearing lymph node in the lower left axilla.  The cytology from this procedure, however, ([name]) also was inconclusive showing no evidence of nodal tissue, mostly blood and fatty tissue.   Her subsequent history is as detailed below  INTERVAL HISTORY: Theresa Wallace returns today for follow-up of her estrogen receptor positive stage IV breast cancer accompanied by her daughter [name]. Theresa Wallace continues on exemestane, with excellent tolerance. Hot flashes and vaginal dryness are not a major issue. She never developed the arthralgias or myalgias that many patients can experience on this medication. She obtains it at a good price.  Since her last visit here when his companion [name] fell, broke his femur, and is now living in a nursing home permanently. There is also significant dementia. Accordingly Theresa Wallace has moved out of her home--the family did not think it was safe for her to live by herself--and she is now living with her daughter [name].  REVIEW OF SYSTEMS: Theresa Wallace had significant diarrhea problems which was eventually treated with Flagyl although according to [name] a C. Difficile study was not obtained. That problem has cleared  although she still has her baseline IVS symptoms. Theresa Wallace denies unusual headaches, visual changes, nausea, vomiting or gait imbalance.she is now terrified of falling and is using a cane.she denies any cough, phlegm production or pleurisy. She has lost about 30 pounds, likely partly from the presumed C. Difficile and partly because of the changes in her life just  described. A detailed review of systems today was otherwise stable  PAST MEDICAL HISTORY: Past Medical History:  Diagnosis Date  . Breast cancer metastasized to multiple sites ([place]) 06/07/2011  . Cancer ([facility])    breast ca  . Hypertension   1. Hypercholesterolemia. 2. Benign tremor. 3. Sigmoid diverticulosis. 4. Onychomycosis. 5. Irritable bowel syndrome. 6. GERD. 7. History of palpitations with Holter monitor in 2008 showing PACs. 8. History of hysterectomy with no salpingo-oophorectomy. 9. History of cholecystectomy. 10. History of bilateral knee arthroscopic surgery. History of hemorrhoid surgery.  FAMILY HISTORY [name] died in an automobile accident.  [name] died with "bone cancer."  The patient had 3 sisters.  One had breast cancer develop in her 38s.  There is also a cousin with breast cancer but no history of ovarian cancer or other history of cancer in first-degree relatives.  GYNECOLOGIC HISTORY: She is GX, P4, first pregnancy to term at age 54.  She was on the hormone replacement after her hysterectomy but only briefly and stopped when she had the initial diagnosis of breast cancer in 1996.  SOCIAL HISTORY: (Updated April 2017) She worked as a Network engineer.  She has been widowed since 2005, her husband [name] dying after a long illness involving strokes and a myocardial infarction.  She lives with [name] who is a retired Chief Financial Officer.  Her daughter [name] used to be a Network engineer for Dr. Inda Merlin, and [name]'s husband, [name], is a [address] assistant with Dr. Durward Fortes.  The patient has 12 grandchildren and 5 great-grandchildren. Incidentally [name] has a son in [place].   ADVANCED DIRECTIVES: In place  HEALTH MAINTENANCE: (Updated 02/26/2013) Social History  Substance Use Topics  . Smoking status: Former Smoker    Quit date: 05/22/1968  . Smokeless tobacco: Never Used  . Alcohol use Yes     Colonoscopy:  PAP: s/p remote  hysterectomy  Bone density:  10/23/2011, normal  Lipid panel: Dr. Inda Merlin, UTD   Allergies  Allergen Reactions  . Ciprofloxacin   . Epinephrine   . Tamoxifen   . Mupirocin Rash    Current Outpatient Prescriptions  Medication Sig Dispense Refill  . aspirin 81 MG tablet Take 81 mg by mouth daily.    . Calcium Carbonate-Vitamin D (CALCIUM-VITAMIN D) 500-200 MG-UNIT per tablet Take 1 tablet by mouth 2 (two) times daily with a meal.    . cholecalciferol (VITAMIN D) 400 UNITS TABS Take 2,000 Units by mouth daily.    Marland Kitchen exemestane (AROMASIN) 25 MG tablet Take 1 tablet (25 mg total) by mouth daily. 90 tablet 3  . Glucosamine HCl-MSM (GLUCOSAMINE-MSM PO) Take 1 tablet by mouth daily.    Marland Kitchen levothyroxine (SYNTHROID, LEVOTHROID) 25 MCG tablet Take 25 mcg by mouth daily.    Marland Kitchen losartan (COZAAR) 50 MG tablet     . metroNIDAZOLE (METROGEL) 0.75 % gel Apply topically 2 (two) times daily. For rosacea    . Multiple Vitamin (MULTIVITAMIN) tablet Take 1 tablet by mouth daily.    . propranolol ER (INDERAL LA) 60 MG 24 hr capsule      No current facility-administered medications for this visit.  Facility-Administered Medications Ordered in Other Visits  Medication Dose Route Frequency Provider Last Rate Last Dose  . iopamidol (ISOVUE-300) 61 % injection             OBJECTIVE: Older white woman in no acute distress There were no vitals filed for this visit. There is no height or weight on file to calculate BMI.  There were no vitals filed for this visit.weight a year ago was 160 pounds. It was 158 pounds in October 2017  Sclerae unicteric, pupils round and equal, left ptosis as previously noted Oropharynx clear and moist No cervical or supraclavicular adenopathy Lungs no rales or rhonchi Heart regular rate and rhythm Abd soft, nontender, positive bowel sounds MSK no focal spinal tenderness, no upper extremity lymphedema Neuro: nonfocal, generally well oriented, appropriate affect Breasts: the  right breast is benign. The left breast is status post lumpectomy, with no evidence of local recurrence. Both axillae are benign.   LAB RESULTS: Lab Results  Component Value Date   WBC 8.9 09/11/2016   NEUTROABS 6.6 (H) 09/11/2016   HGB 13.8 09/11/2016   HCT 41.3 09/11/2016   MCV 89.0 09/11/2016   PLT 227 09/11/2016      Chemistry      Component Value Date/Time   NA 140 09/11/2016 1126   K 4.7 09/11/2016 1126   CL 104 10/24/2012 1014   CO2 28 09/11/2016 1126   BUN 21.2 09/11/2016 1126   CREATININE 1.2 (H) 09/11/2016 1126      Component Value Date/Time   CALCIUM 10.3 09/11/2016 1126   ALKPHOS 99 09/11/2016 1126   AST 31 09/11/2016 1126   ALT 28 09/11/2016 1126   BILITOT 1.28 (H) 09/11/2016 1126      STUDIES: Most recent chest x-ray 2016-04-02 showed no evidence of active cancer. Bone density at [facility]10/30/2017 was normal with a T score of -0.6  ASSESSMENT: 83 y.o. [place] woman:   (1) Status post left lumpectomy in 1996 for a T1b N0 grade 2 invasive ductal carcinoma which was strongly estrogen and progesterone receptor positive, treated with radiation, then anastrozole, then Evista, all treatment discontinued July 2012  METASTATIC DISEASE: (2) recurrence to the lining of the left lung and liver documented by liver biopsy July 2012, showing metastatic adenocarcinoma estrogen and progesterone receptor positive at 96% and 97% respectively; there was no Her-2 amplification.   (3)  She has been on exemestane since early July of 2012, with chest CT scan April 2013 showing complete resolution of her lung and liver metastases  (a) bone density in June of 08/09/2011 was normal  (b) bone density 03/20/2016 was normal with a T score of -0.6  (4) MRI of the lumbar spine 09-2012 showed significant degenerative disease but no evidence of metastatic disease to bone.  PLAN:  Aleila is now nearly 6 years out from definitive documentation of metastatic disease, with no clinicalevidence  of disease activity. This is very favorable.  She continues on exemestane, with excellent tolerance. The plan is to continue that indefinitely on until there is evidence of disease recurrence.  It is also encouraging that her bone density remains normal.  There has been a significant change in her life because of George's decline and her moving in with her daughter. I think this likely will account for her weight loss. If she continues to lose weight, develops other symptoms such as unexplained fatigue or unexplained pain, we would proceed to scans.we will obtain a chest x-ray today as we usually do. It is  also reassuring that her liver function tests remain normal    Dejuana Weist C    09/20/2016

## 2016-09-22 ENCOUNTER — Ambulatory Visit (HOSPITAL_BASED_OUTPATIENT_CLINIC_OR_DEPARTMENT_OTHER): Payer: Medicare Other | Admitting: Oncology

## 2016-09-22 VITALS — BP 166/63 | HR 66 | Temp 97.5°F | Resp 18 | Ht 63.0 in | Wt 143.6 lb

## 2016-09-22 DIAGNOSIS — C50812 Malignant neoplasm of overlapping sites of left female breast: Secondary | ICD-10-CM

## 2016-09-22 DIAGNOSIS — C50912 Malignant neoplasm of unspecified site of left female breast: Secondary | ICD-10-CM | POA: Diagnosis not present

## 2016-09-22 DIAGNOSIS — R911 Solitary pulmonary nodule: Secondary | ICD-10-CM

## 2016-09-22 DIAGNOSIS — K769 Liver disease, unspecified: Secondary | ICD-10-CM | POA: Diagnosis not present

## 2016-09-22 DIAGNOSIS — M899 Disorder of bone, unspecified: Secondary | ICD-10-CM

## 2016-09-22 DIAGNOSIS — C50919 Malignant neoplasm of unspecified site of unspecified female breast: Secondary | ICD-10-CM

## 2016-09-22 DIAGNOSIS — Z170 Estrogen receptor positive status [ER+]: Secondary | ICD-10-CM

## 2016-09-22 NOTE — Progress Notes (Signed)
ID: [name]   DOB: 28-Aug-1923  MR#: [id number]  CSN#:[id number]  PCP: [name], MD GYN: SU:  OTHER MD:  CHIEF COMPLAINT:  Metastatic Breast Cancer  CURRENT TREATMENT: Exemestane, fulvestrant, palbociclib   INTERVAL HISTORY: Theresa Wallace returns today for follow-up of her metastatic estrogen receptor positive breast cancer accompanied by her son-in-law Theresa Wallace. Her daughter Theresa Wallace participated by phone.  I just saw her Theresa Wallace recently and at that time she reported losing about 30 pounds. She was asymptomatic otherwise. We got a chest x-ray just to make sure nothing else was going on, and it showed an increased left pleural effusion. Accordingly we proceeded to a CT scan of her chest 09/20/2016.  Unfortunately compared to the last CT scan in 2013 there are new pulmonary nodules, all subcentimeter, a 1.9 cm left axillary nodule (which previously measured 0.8 cm), and new heterogeneous masses in the right liver measuring up to 8.3 cm. There was a new T12 lesion measuring 1.1 cm and new superior endplate compression deformities at T11 and L1.  Theresa Wallace is here today to discuss these findings and treatment options  REVIEW OF SYSTEMS: Theresa Wallace continues to do generally well. She has a good appetite, normal sense of taste, normal energy for her age, and she denies unusual headaches, sudden visual changes, nausea, vomiting, gait imbalance, cough, pleurisy, worsening shortness of breath, or change in bowel or bladder habits. Essentially a detailed review of systems today was stable.  HISTORY OF PRESENT ILLNESS: From the prior summary:  Theresa Wallace is an 83 y.o.  [place] woman I saw briefly when she moved to this area in 2007.  She had had a left breast cancer removed at [facility] in [place] in [phone] ([place]).  It was grade 2, measured 1 cm maximally and was strongly estrogen and progesterone-receptor positive.  HER-2 was not checked.  This was T1b NX invasive ductal  carcinoma, stage I, and she was treated after radiation with anastrozole for several years, eventually switching to Evista primarily for cost reasons.   More recently, she presented to Dr. Inda Merlin with fatigue and a dry cough.  He auscultated dullness to percussion at the left base and obtained a chest x-ray which confirmed the presence of a left pleural effusion.   Dr. Inda Merlin scheduled Theresa Wallace for an ultrasound-guided left thoracentesis, and this was performed November 09, 2010.  Approximately 1 L of amber-colored fluid was removed.  Cytology from this procedure ([id number]) showed only reactive mesothelial cells.   The patient was then referred here and set up for a CT of the chest and a PET scan.  The CT of the chest on June 20th showed 2 slightly enlarged left axillary lymph nodes with some irregular contours but more importantly multiple pleural nodules in the left chest measuring up to 4.6 cm.  The right chest was normal.  There was no pericardial effusion.  There was only a small to moderate left pleural effusion remaining, and aside from left lower lobe collapse or consolidation, there was no evidence of lung parenchymal involvement.  There was no evidence of bone involvement and also no calcified pleural plaques and no worrisome bone involvement.  PET scan on June 27th showed the nodularity along the left pleura to be intensely hypermetabolic.  For example the large lobe nodule measuring 4.5 cm had an SUV max of 12.9.  There was no hypermetabolic mediastinal nodal activity.  The to left axillary lymph nodes were very mildly hypermetabolic.  The left breast was  clear.  There was 1 hypermetabolic lesion in the liver measuring 3.2 cm.  On June 27th, Dr. [name] performed fine-needle aspiration of a 7 mm abnormal-appearing lymph node in the lower left axilla.  The cytology from this procedure, however, ([name]) also was inconclusive showing no evidence of nodal tissue, mostly blood and fatty tissue.   Her  subsequent history is as detailed below   PAST MEDICAL HISTORY: Past Medical History:  Diagnosis Date  . Breast cancer metastasized to multiple sites ([place]) 06/07/2011  . Cancer ([place])    breast ca  . Hypertension   1. Hypercholesterolemia. 2. Benign tremor. 3. Sigmoid diverticulosis. 4. Onychomycosis. 5. Irritable bowel syndrome. 6. GERD. 7. History of palpitations with Holter monitor in 2008 showing PACs. 8. History of hysterectomy with no salpingo-oophorectomy. 9. History of cholecystectomy. 10. History of bilateral knee arthroscopic surgery. History of hemorrhoid surgery.  FAMILY HISTORY [name] died in an automobile accident.  [name] died with "bone cancer."  The patient had 3 sisters.  One had breast cancer develop in her 55s.  There is also a cousin with breast cancer but no history of ovarian cancer or other history of cancer in first-degree relatives.  GYNECOLOGIC HISTORY: She is GX, P4, first pregnancy to term at age 36.  She was on the hormone replacement after her hysterectomy but only briefly and stopped when she had the initial diagnosis of breast cancer in 1996.  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  Her significant other Theresa Wallace who is a retired Chief Financial Officer is now suffering from dementia and is in a skilled nursing facility. The patient lives with her daughter Theresa Wallace who used to be a Network engineer for Dr. Inda Merlin, and [name]'s husband, Theresa Wallace, who is a physician's assistant with Dr. Durward Fortes.  The patient has 12 grandchildren and 5 great-grandchildren. Incidentally Theresa Wallace has a son in [place].   ADVANCED DIRECTIVES: In place  HEALTH MAINTENANCE: (Updated 02/26/2013) Social History  Substance Use Topics  . Smoking status: Former Smoker    Quit date: 05/22/1968  . Smokeless tobacco:  Never Used  . Alcohol use Yes     Colonoscopy:  PAP: s/p remote hysterectomy  Bone density:  10/23/2011, normal  Lipid panel: Dr. Inda Merlin, UTD   Allergies  Allergen Reactions  . Ciprofloxacin   . Epinephrine   . Tamoxifen   . Mupirocin Rash    Current Outpatient Prescriptions  Medication Sig Dispense Refill  . aspirin 81 MG tablet Take 81 mg by mouth daily.    . Calcium Carbonate-Vitamin D (CALCIUM-VITAMIN D) 500-200 MG-UNIT per tablet Take 1 tablet by mouth 2 (two) times daily with a meal.    . cholecalciferol (VITAMIN D) 400 UNITS TABS Take 2,000 Units by mouth daily.    Marland Kitchen exemestane (AROMASIN) 25 MG tablet Take 1 tablet (25 mg total) by mouth daily. 90 tablet 3  . Glucosamine HCl-MSM (GLUCOSAMINE-MSM PO) Take 1 tablet by mouth daily.    Marland Kitchen levothyroxine (SYNTHROID, LEVOTHROID) 25 MCG tablet Take 25 mcg by mouth daily.    Marland Kitchen losartan (COZAAR) 50 MG tablet     . metroNIDAZOLE (METROGEL) 0.75 % gel Apply topically 2 (two) times daily. For rosacea    . Multiple Vitamin (MULTIVITAMIN) tablet Take 1 tablet by mouth daily.    . propranolol ER (INDERAL LA) 60 MG 24 hr capsule  No current facility-administered medications for this visit.     OBJECTIVE: Older white womanWho appears stated age 20:   09/22/16 1506  BP: (!) 166/63  Pulse: 66  Resp: 18  Temp: 97.5 F (36.4 C)   Body mass index is 25.44 kg/m.  Filed Weights   09/22/16 1506  Weight: 143 lb 9.6 oz (65.1 kg)  weight a year ago was 160 pounds  Sclerae unicteric, EOMs intact, moderate left ptosis, old Oropharynx clear and moist No cervical or supraclavicular adenopathy Lungs no rales or rhonchi Heart regular rate and rhythm Abd soft, nontender, positive bowel sounds MSK no focal spinal tenderness, no upper extremity lymphedema Neuro: nonfocal, well oriented, appropriate affect Breasts: The right breast is unremarkable. The left breast is status post lumpectomy. There is no evidence of local recurrence.  There is a palpable mass in the left axilla, which is difficult to appreciate    LAB RESULTS: Lab Results  Component Value Date   WBC 8.9 09/11/2016   NEUTROABS 6.6 (H) 09/11/2016   HGB 13.8 09/11/2016   HCT 41.3 09/11/2016   MCV 89.0 09/11/2016   PLT 227 09/11/2016      Chemistry      Component Value Date/Time   NA 140 09/11/2016 1126   K 4.7 09/11/2016 1126   CL 104 10/24/2012 1014   CO2 28 09/11/2016 1126   BUN 21.2 09/11/2016 1126   CREATININE 1.2 (H) 09/11/2016 1126      Component Value Date/Time   CALCIUM 10.3 09/11/2016 1126   ALKPHOS 99 09/11/2016 1126   AST 31 09/11/2016 1126   ALT 28 09/11/2016 1126   BILITOT 1.28 (H) 09/11/2016 1126      STUDIES: Dg Chest 2 View  Result Date: 09/11/2016 CLINICAL DATA:  Breast cancer.  No chest complaints. EXAM: CHEST  2 VIEW COMPARISON:  03/16/2016. FINDINGS: Increasing LEFT pleural effusion. No LEFT lower lobe consolidation. RIGHT lung clear. Stable and unremarkable cardiomediastinal silhouette. Thoracic atherosclerosis. No rib fracture. IMPRESSION: Increasing LEFT pleural effusion, significance uncertain. Consider CT chest with contrast for further evaluation. Thoracic aortic atherosclerosis. Electronically Signed   By: Staci Righter M.D.   On: 09/11/2016 14:44   Ct Chest W Contrast  Result Date: 09/20/2016 CLINICAL DATA:  Stage IV breast cancer, cough. EXAM: CT CHEST WITH CONTRAST TECHNIQUE: Multidetector CT imaging of the chest was performed during intravenous contrast administration. CONTRAST:  54m ISOVUE-300 IOPAMIDOL (ISOVUE-300) INJECTION 61% COMPARISON:  Chest radiograph 09/11/2016 and CT chest 08/30/2011. FINDINGS: Cardiovascular: Atherosclerotic calcification of the arterial vasculature, including coronary arteries. Heart size within normal limits. No pericardial effusion. Mediastinum/Nodes: Low-attenuation lesions in the right thyroid appear subcentimeter in size. No pathologically enlarged mediastinal or hilar lymph  nodes. Left axillary adenopathy measures up to 1.9 cm, previously 8 mm. No right axillary or internal mammary adenopathy. Esophagus is grossly unremarkable. Lungs/Pleura: There are a few scattered new pulmonary nodules, measuring up to 4 mm in the right lower lobe. Mild scattered subpleural nodularity in the right hemithorax, new. Extensive nodular pleural thickening on the left, with a small left pleural effusion. Airway is unremarkable. Upper Abdomen: New heterogeneous masses in the right hepatic lobe measure up to approximately 5.6 x 8.3 cm, inferiorly. Visualized portions of the adrenal glands, kidneys, spleen, pancreas, stomach and bowel are grossly unremarkable. No upper abdominal adenopathy. Musculoskeletal: A 1.1 cm mixed lytic and sclerotic lesion in T12 is seen anteriorly, new. New superior endplate compression deformities involving T11 and L1. IMPRESSION: 1. Left axillary adenopathy with new metastatic  disease involving the chest, liver and bones. 2. Aortic atherosclerosis (ICD10-170.0). Coronary artery calcification. 3. New superior endplate compression deformities involving T11 and L1. Electronically Signed   By: Lorin Picket M.D.   On: 09/20/2016 12:52     ASSESSMENT: 83 y.o. [place] woman:   (1) Status post left lumpectomy in 1996 for a T1b N0 grade 2 invasive ductal carcinoma which was strongly estrogen and progesterone receptor positive, treated with radiation, then anastrozole, then Evista, all treatment discontinued July 2012  METASTATIC DISEASE: (2) recurrence to the lining of the left lung and liver documented by liver biopsy July 2012, showing metastatic adenocarcinoma estrogen and progesterone receptor positive at 96% and 97% respectively; there was no Her-2 amplification.   (3)  She has been on exemestane since early July of 2012, with chest CT scan April 2013 showing complete resolution of her lung and liver metastases  (a) bone density in June of 08/09/2011 was  normal  (b) bone density 03/20/2016 was normal with a T score of -0.6  (c) exemestane continued at the time of disease progression noted May 2018  (4) MRI of the lumbar spine 09-2012 showed significant degenerative disease but no evidence of metastatic disease to bone.  (5) disease progression noted May 2018: CT scan shows liver, lung, and bone lesions  (6) fulvestrant started 09/26/2016, palbociclib to be added 10/24/2016  PLAN:  Aspect approximately 40 minutes with Theresa Wallace and her family going over her change situation. Specifically, metastases to the liver are of great concern and likely explain her weight loss, although the fact that she is no longer living independently and there have been other changes in her social situation may also partly account for this.  She understands that stage IV breast cancer is not curable with our current knowledge base. The goal of treatment is control. The strategy of treatment is to do only the minimum necessary to control the growth of the tumor so that the patient can have as normal a life as possible. There is no survival advantage in treating aggressively if treating less aggressively results in tumor control. With this strategy stage IV breast cancer in many cases can function as a "chronic illness": something that cannot be quite gotten rid of but can be controlled for an indefinite period of time  There are always 3 questions to go over and so we reviewed those today. The first question is do we treat or not. In some cases the patient's overall situation is so discouraging that palliative/comfort care alone is appropriate. If the decision is made to treat, then the next question is whether anti-estrogens or chemotherapy is more appropriate. Once that decision is made than the third question is: Which agent or combination of agents in particular should be used?  Theresa Wallace is very clear that she definitely wants treatment and she also greatly would prefer to  avoid chemotherapy if at all possible. Accordingly the plan will be to continue exemestane, at fulvestrant, and then once she is on the monthly fulvestrant doses, at palbociclib.  We discussed the possible toxicities, side effects and complications of these agents.  Once she has been on fulvestrant 3-4 months we will repeat staging studies, likely to include a bone scan and liver MRI as well as a chest CT.  We will start her fulvestrant next week. She will receive a booster dose 2 weeks later, and she will see me the first week in June at which time we will add the palbociclib.  Of course  there is the possibility that the cancer has changed and is no longer estrogen receptor positive. For that reason I am setting her up for a biopsy of the left axillary mass, which should be the most easily accessible lesion. I do expected to be still estrogen receptor positive.  Note that Theresa Wallace already has a living will in place. She has named her son-in-law Werner Lean as her healthcare part of attorney.    Thomson Herbers C    09/24/2016

## 2016-09-24 MED ORDER — PALBOCICLIB 125 MG PO CAPS: 125.0000 mg | ORAL_CAPSULE | Freq: Every day | ORAL | 6 refills | Status: DC

## 2016-09-25 MED FILL — IBRANCE 125 MG CAPSULE: 125 | 21 days supply | Qty: 21 | Fill #0

## 2016-09-26 ENCOUNTER — Encounter: Payer: Self-pay | Admitting: Oncology

## 2016-09-26 ENCOUNTER — Ambulatory Visit (HOSPITAL_BASED_OUTPATIENT_CLINIC_OR_DEPARTMENT_OTHER): Payer: Medicare Other

## 2016-09-26 ENCOUNTER — Other Ambulatory Visit (HOSPITAL_BASED_OUTPATIENT_CLINIC_OR_DEPARTMENT_OTHER): Payer: Medicare Other

## 2016-09-26 VITALS — BP 170/66 | HR 61 | Temp 98.1°F | Resp 18

## 2016-09-26 DIAGNOSIS — Z170 Estrogen receptor positive status [ER+]: Principal | ICD-10-CM

## 2016-09-26 DIAGNOSIS — C50919 Malignant neoplasm of unspecified site of unspecified female breast: Secondary | ICD-10-CM

## 2016-09-26 DIAGNOSIS — C50912 Malignant neoplasm of unspecified site of left female breast: Secondary | ICD-10-CM

## 2016-09-26 DIAGNOSIS — K769 Liver disease, unspecified: Secondary | ICD-10-CM

## 2016-09-26 DIAGNOSIS — C50922 Malignant neoplasm of unspecified site of left male breast: Secondary | ICD-10-CM

## 2016-09-26 DIAGNOSIS — Z5111 Encounter for antineoplastic chemotherapy: Secondary | ICD-10-CM | POA: Diagnosis not present

## 2016-09-26 DIAGNOSIS — C50812 Malignant neoplasm of overlapping sites of left female breast: Secondary | ICD-10-CM

## 2016-09-26 LAB — CBC WITH DIFFERENTIAL/PLATELET
BASO%: 0.6 % (ref 0.0–2.0)
Basophils Absolute: 0.1 10*3/uL (ref 0.0–0.1)
EOS%: 2.4 % (ref 0.0–7.0)
Eosinophils Absolute: 0.2 10*3/uL (ref 0.0–0.5)
HCT: 40.2 % (ref 34.8–46.6)
HGB: 13.3 g/dL (ref 11.6–15.9)
LYMPH%: 14.1 % (ref 14.0–49.7)
MCH: 29.5 pg (ref 25.1–34.0)
MCHC: 33.1 g/dL (ref 31.5–36.0)
MCV: 89.2 fL (ref 79.5–101.0)
MONO#: 0.6 10*3/uL (ref 0.1–0.9)
MONO%: 6.5 % (ref 0.0–14.0)
NEUT#: 6.9 10*3/uL — ABNORMAL HIGH (ref 1.5–6.5)
NEUT%: 76.4 % (ref 38.4–76.8)
PLATELETS: 216 10*3/uL (ref 145–400)
RBC: 4.5 10*6/uL (ref 3.70–5.45)
RDW: 14.3 % (ref 11.2–14.5)
WBC: 9 10*3/uL (ref 3.9–10.3)
lymph#: 1.3 10*3/uL (ref 0.9–3.3)

## 2016-09-26 LAB — COMPREHENSIVE METABOLIC PANEL
ALT: 29 U/L (ref 0–55)
ANION GAP: 8 meq/L (ref 3–11)
AST: 32 U/L (ref 5–34)
Albumin: 4 g/dL (ref 3.5–5.0)
Alkaline Phosphatase: 104 U/L (ref 40–150)
BUN: 30.6 mg/dL — ABNORMAL HIGH (ref 7.0–26.0)
CHLORIDE: 99 meq/L (ref 98–109)
CO2: 27 meq/L (ref 22–29)
CREATININE: 1.4 mg/dL — AB (ref 0.6–1.1)
Calcium: 10.1 mg/dL (ref 8.4–10.4)
EGFR: 32 mL/min/{1.73_m2} — AB (ref >90)
Glucose: 86 mg/dl (ref 70–140)
Potassium: 4.9 mEq/L (ref 3.5–5.1)
SODIUM: 134 meq/L — AB (ref 136–145)
Total Bilirubin: 1.26 mg/dL — ABNORMAL HIGH (ref 0.20–1.20)
Total Protein: 7.1 g/dL (ref 6.4–8.3)

## 2016-09-26 LAB — PROTIME-INR
INR: 1 — AB (ref 2.00–3.50)
Protime: 12 Seconds (ref 10.6–13.4)

## 2016-09-26 MED ORDER — FULVESTRANT 250 MG/5ML IM SOLN
500.0000 mg | Freq: Once | INTRAMUSCULAR | Status: AC
  Administered 2016-09-26: 500 mg via INTRAMUSCULAR
  Filled 2016-09-26: qty 10

## 2016-09-26 NOTE — Patient Instructions (Signed)

## 2016-09-27 ENCOUNTER — Other Ambulatory Visit: Payer: Self-pay | Admitting: Radiology

## 2016-09-27 LAB — CANCER ANTIGEN 27.29

## 2016-09-28 ENCOUNTER — Telehealth: Payer: Self-pay

## 2016-09-28 NOTE — Telephone Encounter (Signed)
Spoke with pt's dtr, [name], regarding her email wanting confirmation on when to start Yetter.  Confirmed with pt that per Dr Virgie Dad note, pt will start taking Ibrance after she sees him on 10/24/16.  [name] verbalizes understanding of current plan

## 2016-10-03 ENCOUNTER — Telehealth: Payer: Self-pay | Admitting: Pharmacist

## 2016-10-03 NOTE — Telephone Encounter (Signed)
Oral Chemotherapy Pharmacist Encounter  Received notification from [place] that Ibrance prescription e-scribed by MD on 09/24/16 would require prior authorization. Labs from 09/26/16 reviewed, OK for treatment, noted increased bilirubin- d/t liver metastasis Current medication list in Epic assessed, no DDIs with Ibrance identified.  Prior authorization submitted on CoverMyMeds  Key [id number] Status is pending  Noted planned start date of Ibrance is 10/24/16 after MD follow-up.  Oral Oncology Clinic will continue to follow.  Johny Drilling, PharmD, BCPS, BCOP 10/03/2016  3:08 PM Oral Oncology Clinic [phone]

## 2016-10-10 ENCOUNTER — Other Ambulatory Visit (HOSPITAL_BASED_OUTPATIENT_CLINIC_OR_DEPARTMENT_OTHER): Payer: Medicare Other

## 2016-10-10 ENCOUNTER — Ambulatory Visit (HOSPITAL_BASED_OUTPATIENT_CLINIC_OR_DEPARTMENT_OTHER): Payer: Medicare Other

## 2016-10-10 VITALS — BP 160/72 | HR 65 | Temp 97.0°F | Resp 20

## 2016-10-10 DIAGNOSIS — Z5111 Encounter for antineoplastic chemotherapy: Secondary | ICD-10-CM | POA: Diagnosis not present

## 2016-10-10 DIAGNOSIS — C50912 Malignant neoplasm of unspecified site of left female breast: Secondary | ICD-10-CM | POA: Diagnosis not present

## 2016-10-10 DIAGNOSIS — C50919 Malignant neoplasm of unspecified site of unspecified female breast: Secondary | ICD-10-CM

## 2016-10-10 DIAGNOSIS — Z170 Estrogen receptor positive status [ER+]: Principal | ICD-10-CM

## 2016-10-10 DIAGNOSIS — C50812 Malignant neoplasm of overlapping sites of left female breast: Secondary | ICD-10-CM

## 2016-10-10 DIAGNOSIS — C50922 Malignant neoplasm of unspecified site of left male breast: Secondary | ICD-10-CM

## 2016-10-10 LAB — COMPREHENSIVE METABOLIC PANEL
ALT: 29 U/L (ref 0–55)
AST: 27 U/L (ref 5–34)
Albumin: 3.8 g/dL (ref 3.5–5.0)
Alkaline Phosphatase: 101 U/L (ref 40–150)
Anion Gap: 9 mEq/L (ref 3–11)
BUN: 23.6 mg/dL (ref 7.0–26.0)
CALCIUM: 9.8 mg/dL (ref 8.4–10.4)
CHLORIDE: 100 meq/L (ref 98–109)
CO2: 26 mEq/L (ref 22–29)
Creatinine: 1.2 mg/dL — ABNORMAL HIGH (ref 0.6–1.1)
EGFR: 40 mL/min/{1.73_m2} — AB (ref >90)
Glucose: 127 mg/dl (ref 70–140)
POTASSIUM: 4.4 meq/L (ref 3.5–5.1)
SODIUM: 135 meq/L — AB (ref 136–145)
Total Bilirubin: 1.22 mg/dL — ABNORMAL HIGH (ref 0.20–1.20)
Total Protein: 6.7 g/dL (ref 6.4–8.3)

## 2016-10-10 LAB — CBC WITH DIFFERENTIAL/PLATELET
BASO%: 0.3 % (ref 0.0–2.0)
BASOS ABS: 0 10*3/uL (ref 0.0–0.1)
EOS%: 3.5 % (ref 0.0–7.0)
Eosinophils Absolute: 0.3 10*3/uL (ref 0.0–0.5)
HEMATOCRIT: 40 % (ref 34.8–46.6)
HGB: 13.2 g/dL (ref 11.6–15.9)
LYMPH%: 15.7 % (ref 14.0–49.7)
MCH: 29.7 pg (ref 25.1–34.0)
MCHC: 33 g/dL (ref 31.5–36.0)
MCV: 89.9 fL (ref 79.5–101.0)
MONO#: 0.6 10*3/uL (ref 0.1–0.9)
MONO%: 6.4 % (ref 0.0–14.0)
NEUT#: 6.4 10*3/uL (ref 1.5–6.5)
NEUT%: 74.1 % (ref 38.4–76.8)
Platelets: 202 10*3/uL (ref 145–400)
RBC: 4.45 10*6/uL (ref 3.70–5.45)
RDW: 13.9 % (ref 11.2–14.5)
WBC: 8.6 10*3/uL (ref 3.9–10.3)
lymph#: 1.4 10*3/uL (ref 0.9–3.3)

## 2016-10-10 MED ORDER — FULVESTRANT 250 MG/5ML IM SOLN
500.0000 mg | Freq: Once | INTRAMUSCULAR | Status: AC
  Administered 2016-10-10: 500 mg via INTRAMUSCULAR
  Filled 2016-10-10: qty 10

## 2016-10-10 NOTE — Patient Instructions (Signed)

## 2016-10-13 NOTE — Telephone Encounter (Signed)
Oral Chemotherapy Pharmacist Encounter  Prior authorization approval from [employer] for Ibrance Effective dates: 10/03/16-05/21/17 [id number]  I will alert [facility] to continue to process prescription.  Oral Oncology Clinic will continue to follow.  Johny Drilling, PharmD, BCPS, BCOP 10/13/2016  12:50 PM Oral Oncology Clinic [phone]

## 2016-10-24 ENCOUNTER — Ambulatory Visit (HOSPITAL_BASED_OUTPATIENT_CLINIC_OR_DEPARTMENT_OTHER): Payer: Medicare Other | Admitting: Oncology

## 2016-10-24 ENCOUNTER — Ambulatory Visit (HOSPITAL_BASED_OUTPATIENT_CLINIC_OR_DEPARTMENT_OTHER): Payer: Medicare Other

## 2016-10-24 ENCOUNTER — Other Ambulatory Visit (HOSPITAL_BASED_OUTPATIENT_CLINIC_OR_DEPARTMENT_OTHER): Payer: Medicare Other

## 2016-10-24 VITALS — BP 140/43 | HR 65 | Temp 97.8°F | Resp 18 | Ht 63.0 in | Wt 144.8 lb

## 2016-10-24 DIAGNOSIS — C50922 Malignant neoplasm of unspecified site of left male breast: Secondary | ICD-10-CM

## 2016-10-24 DIAGNOSIS — C50912 Malignant neoplasm of unspecified site of left female breast: Secondary | ICD-10-CM

## 2016-10-24 DIAGNOSIS — C787 Secondary malignant neoplasm of liver and intrahepatic bile duct: Secondary | ICD-10-CM

## 2016-10-24 DIAGNOSIS — C7802 Secondary malignant neoplasm of left lung: Secondary | ICD-10-CM

## 2016-10-24 DIAGNOSIS — Z170 Estrogen receptor positive status [ER+]: Secondary | ICD-10-CM | POA: Diagnosis not present

## 2016-10-24 DIAGNOSIS — J410 Simple chronic bronchitis: Secondary | ICD-10-CM

## 2016-10-24 DIAGNOSIS — R5383 Other fatigue: Secondary | ICD-10-CM

## 2016-10-24 DIAGNOSIS — Z5111 Encounter for antineoplastic chemotherapy: Secondary | ICD-10-CM

## 2016-10-24 DIAGNOSIS — C50812 Malignant neoplasm of overlapping sites of left female breast: Secondary | ICD-10-CM

## 2016-10-24 DIAGNOSIS — R197 Diarrhea, unspecified: Secondary | ICD-10-CM

## 2016-10-24 DIAGNOSIS — C50919 Malignant neoplasm of unspecified site of unspecified female breast: Secondary | ICD-10-CM

## 2016-10-24 LAB — CBC WITH DIFFERENTIAL/PLATELET
BASO%: 0.7 % (ref 0.0–2.0)
BASOS ABS: 0.1 10*3/uL (ref 0.0–0.1)
EOS ABS: 0.5 10*3/uL (ref 0.0–0.5)
EOS%: 5.3 % (ref 0.0–7.0)
HCT: 39.4 % (ref 34.8–46.6)
HEMOGLOBIN: 13.4 g/dL (ref 11.6–15.9)
LYMPH%: 15.4 % (ref 14.0–49.7)
MCH: 30.3 pg (ref 25.1–34.0)
MCHC: 34.1 g/dL (ref 31.5–36.0)
MCV: 88.8 fL (ref 79.5–101.0)
MONO#: 0.6 10*3/uL (ref 0.1–0.9)
MONO%: 7.2 % (ref 0.0–14.0)
NEUT%: 71.4 % (ref 38.4–76.8)
NEUTROS ABS: 6.1 10*3/uL (ref 1.5–6.5)
PLATELETS: 210 10*3/uL (ref 145–400)
RBC: 4.44 10*6/uL (ref 3.70–5.45)
RDW: 13.9 % (ref 11.2–14.5)
WBC: 8.6 10*3/uL (ref 3.9–10.3)
lymph#: 1.3 10*3/uL (ref 0.9–3.3)

## 2016-10-24 LAB — COMPREHENSIVE METABOLIC PANEL
ALBUMIN: 3.8 g/dL (ref 3.5–5.0)
ALK PHOS: 100 U/L (ref 40–150)
ALT: 37 U/L (ref 0–55)
ANION GAP: 11 meq/L (ref 3–11)
AST: 34 U/L (ref 5–34)
BILIRUBIN TOTAL: 1.27 mg/dL — AB (ref 0.20–1.20)
BUN: 26 mg/dL (ref 7.0–26.0)
CO2: 25 mEq/L (ref 22–29)
Calcium: 9.6 mg/dL (ref 8.4–10.4)
Chloride: 98 mEq/L (ref 98–109)
Creatinine: 1.3 mg/dL — ABNORMAL HIGH (ref 0.6–1.1)
EGFR: 38 mL/min/{1.73_m2} — AB (ref >90)
Glucose: 103 mg/dl (ref 70–140)
Potassium: 4.7 mEq/L (ref 3.5–5.1)
Sodium: 134 mEq/L — ABNORMAL LOW (ref 136–145)
TOTAL PROTEIN: 6.6 g/dL (ref 6.4–8.3)

## 2016-10-24 MED ORDER — FULVESTRANT 250 MG/5ML IM SOLN
500.0000 mg | Freq: Once | INTRAMUSCULAR | Status: AC
  Administered 2016-10-24: 500 mg via INTRAMUSCULAR
  Filled 2016-10-24: qty 10

## 2016-10-24 NOTE — Patient Instructions (Signed)

## 2016-10-24 NOTE — Progress Notes (Signed)
ID: Theresa Wallace   DOB: 08-24-1923  MR#: [id number]  DVV#:[id number]  PCP: [name], MD GYN: SU:  OTHER MD:  CHIEF COMPLAINT:  Metastatic Breast Cancer  CURRENT TREATMENT: Exemestane, fulvestrant, palbociclib   INTERVAL HISTORY: Theresa Wallace returns today for follow-up of her metastatic estrogen receptor positive breast cancer accompanied by her daughter [name]. Since her last visit here and he underwent biopsy of her left axillary mass. This was performed 09/28/2015 and showed (S [phone]) adenocarcinoma which was estrogen receptor 95% positive, with strong staining intensity, but progesterone receptor negative. HER-2 was not amplified, with a signals ratio of 1.24 and the number per cell 2.30.  Theresa Wallace started fulvestrant 09/26/2016. She receives her third dose, her first monthly dose, today. She tolerates the treatments with no side effects that she is aware of. Of course this shots and cells are somewhat uncomfortable.  She is also on exemestane. She tolerates this well, with no significant problems with hot flashes vaginal dryness or arthralgias/myalgias.   REVIEW OF SYSTEMS: Theresa Wallace feels mildly tired. She has a little bit of nausea at times. She does not vomit. She is not eating any particular diet and is able to maintainher weight after nitially osing. She is drinking oe.She feels cold a good bit of the timeShe feels anxious but not depresed. Shas stress urinary incontinenc. Occonally she hasloose bowel movements. It detailed review of systems was otherwise stable.  HISTORY OF PRESENT ILLNESS: From the prior summary:  Theresa Wallace is an 83 y.o.  [place] woman I saw briefly when she moved to this area in 2007.  She had had a left breast cancer removed at [facility] in [place] in [phone] ([place]).  It was grade 2, measured 1 cm maximally and was strongly estrogen and progesterone-receptor positive.  HER-2 was not checked.  This was T1b NX invasive ductal  carcinoma, stage I, and she was treated after radiation with anastrozole for several years, eventually switching to Evista primarily for cost reasons.   More recently, she presented to Dr. Inda Merlin with fatigue and a dry cough.  He auscultated dullness to percussion at the left base and obtained a chest x-ray which confirmed the presence of a left pleural effusion.   Dr. Inda Merlin scheduled [name] for an ultrasound-guided left thoracentesis, and this was performed November 09, 2010.  Approximately 1 L of amber-colored fluid was removed.  Cytology from this procedure ([id number]) showed only reactive mesothelial cells.   The patient was then referred here and set up for a CT of the chest and a PET scan.  The CT of the chest on June 20th showed 2 slightly enlarged left axillary lymph nodes with some irregular contours but more importantly multiple pleural nodules in the left chest measuring up to 4.6 cm.  The right chest was normal.  There was no pericardial effusion.  There was only a small to moderate left pleural effusion remaining, and aside from left lower lobe collapse or consolidation, there was no evidence of lung parenchymal involvement.  There was no evidence of bone involvement and also no calcified pleural plaques and no worrisome bone involvement.  PET scan on June 27th showed the nodularity along the left pleura to be intensely hypermetabolic.  For example the large lobe nodule measuring 4.5 cm had an SUV max of 12.9.  There was no hypermetabolic mediastinal nodal activity.  The to left axillary lymph nodes were very mildly hypermetabolic.  The left breast was clear.  There was 1 hypermetabolic  lesion in the liver measuring 3.2 cm.  On June 27th, Dr. [name] performed fine-needle aspiration of a 7 mm abnormal-appearing lymph node in the lower left axilla.  The cytology from this procedure, however, ([name]) also was inconclusive showing no evidence of nodal tissue, mostly blood and fatty tissue.   Her  subsequent history is as detailed below   PAST MEDICAL HISTORY: Past Medical History:  Diagnosis Date  . Breast cancer metastasized to multiple sites ([place]) 06/07/2011  . Cancer ([place])    breast ca  . Hypertension   1. Hypercholesterolemia. 2. Benign tremor. 3. Sigmoid diverticulosis. 4. Onychomycosis. 5. Irritable bowel syndrome. 6. GERD. 7. History of palpitations with Holter monitor in 2008 showing PACs. 8. History of hysterectomy with no salpingo-oophorectomy. 9. History of cholecystectomy. 10. History of bilateral knee arthroscopic surgery. History of hemorrhoid surgery.  FAMILY HISTORY [name] died in an automobile accident.  [name] died with "bone cancer."  The patient had 3 sisters.  One had breast cancer develop in her 84s.  There is also a cousin with breast cancer but no history of ovarian cancer or other history of cancer in first-degree relatives.  GYNECOLOGIC HISTORY: She is GX, P4, first pregnancy to term at age 83.  She was on the hormone replacement after her hysterectomy but only briefly and stopped when she had the initial diagnosis of breast cancer in 1996.  SOCIAL HISTORY: (Updated May 2018) She worked as a Network engineer.  She has been widowed since 2005, her husband [name] dying after a long illness involving strokes and a myocardial infarction.  Her significant other [name] who is a retired Chief Financial Officer is now suffering from dementia and is in a skilled nursing facility. The patient lives with her daughter [name] who used to be a Network engineer for Dr. Inda Merlin, and [name]'s husband, [name], who is a physician's assistant with Dr. Durward Fortes.  The patient has 12 grandchildren and 5 great-grandchildren. Incidentally [name] has a son in [place].   ADVANCED DIRECTIVES: In place  HEALTH MAINTENANCE: (Updated 02/26/2013) Social History  Substance Use Topics  . Smoking status: Former Smoker    Quit date: 05/22/1968  . Smokeless tobacco:  Never Used  . Alcohol use Yes     Colonoscopy:  PAP: s/p remote hysterectomy  Bone density:  10/23/2011, normal  Lipid panel: Dr. Inda Merlin, UTD   Allergies  Allergen Reactions  . Ciprofloxacin   . Epinephrine   . Tamoxifen   . Mupirocin Rash    Current Outpatient Prescriptions  Medication Sig Dispense Refill  . aspirin 81 MG tablet Take 81 mg by mouth daily.    . Calcium Carbonate-Vitamin D (CALCIUM-VITAMIN D) 500-200 MG-UNIT per tablet Take 1 tablet by mouth 2 (two) times daily with a meal.    . cholecalciferol (VITAMIN D) 400 UNITS TABS Take 2,000 Units by mouth daily.    Marland Kitchen exemestane (AROMASIN) 25 MG tablet Take 1 tablet (25 mg total) by mouth daily. 90 tablet 3  . Glucosamine HCl-MSM (GLUCOSAMINE-MSM PO) Take 1 tablet by mouth daily.    Marland Kitchen levothyroxine (SYNTHROID, LEVOTHROID) 25 MCG tablet Take 25 mcg by mouth daily.    Marland Kitchen losartan (COZAAR) 50 MG tablet     . metroNIDAZOLE (METROGEL) 0.75 % gel Apply topically 2 (two) times daily. For rosacea    . Multiple Vitamin (MULTIVITAMIN) tablet Take 1 tablet by mouth daily.    . palbociclib (IBRANCE) 125 MG capsule Take 1 capsule (125 mg total) by mouth daily with breakfast.  Take whole with food. 21 capsule 6  . propranolol ER (INDERAL LA) 60 MG 24 hr capsule      No current facility-administered medications for this visit.     OBJECTIVE: Older white woman in no acute distress  Vitals:   10/24/16 1005  BP: (!) 140/43  Pulse: 65  Resp: 18  Temp: 97.8 F (36.6 C)   Body mass index is 25.65 kg/m.  Filed Weights   10/24/16 1005  Weight: 144 lb 12.8 oz (65.7 kg)  weight a year ago was 160 pounds  Sclerae unicteric, pupils round and equal, chronic moderate left ptosis Oropharynx clear and moist No cervical or supraclavicular adenopathy Lungs no rales or rhonchi Heart regular rate and rhythm Abd soft, nontender, positive bowel sounds MSK no focal spinal tenderness, no upper extremity lymphedema Neuro: nonfocal, well  oriented, appropriate affect Breasts: The area of the left axillary biopsy is intact.    LAB RESULTS: Lab Results  Component Value Date   WBC 8.6 10/24/2016   NEUTROABS 6.1 10/24/2016   HGB 13.4 10/24/2016   HCT 39.4 10/24/2016   MCV 88.8 10/24/2016   PLT 210 10/24/2016      Chemistry      Component Value Date/Time   NA 135 (L) 10/10/2016 1008   K 4.4 10/10/2016 1008   CL 104 10/24/2012 1014   CO2 26 10/10/2016 1008   BUN 23.6 10/10/2016 1008   CREATININE 1.2 (H) 10/10/2016 1008      Component Value Date/Time   CALCIUM 9.8 10/10/2016 1008   ALKPHOS 101 10/10/2016 1008   AST 27 10/10/2016 1008   ALT 29 10/10/2016 1008   BILITOT 1.22 (H) 10/10/2016 1008      STUDIES:Pathology report discussed with the patient ASSESSMENT: 83 y.o. [place] woman:   (1) Status post left lumpectomy in 1996 for a T1b N0 grade 2 invasive ductal carcinoma which was strongly estrogen and progesterone receptor positive, treated with radiation, then anastrozole, then Evista, all treatment discontinued July 2012  METASTATIC DISEASE: (2) recurrence to the lining of the left lung and liver documented by liver biopsy July 2012, showing metastatic adenocarcinoma estrogen and progesterone receptor positive at 96% and 97% respectively; there was no Her-2 amplification.   (3)  She has been on exemestane since early July of 2012, with chest CT scan April 2013 showing complete resolution of her lung and liver metastases  (a) bone density in June of 08/09/2011 was normal  (b) bone density 03/20/2016 was normal with a T score of -0.6  (c) exemestane continued at the time of disease progression noted May 2018  (4) MRI of the lumbar spine 09-2012 showed significant degenerative disease but no evidence of metastatic disease to bone.  (5) disease progression noted May 2018: CT scan shows liver, lung, and bone lesions  (6) fulvestrant started 09/26/2016, palbociclib added 10/24/2016 at 125 mg daily, 21 days  on  PLAN:  Theresa Wallace is tolerating the exemestane and fulvestrant well and these are being continued. Today she is starting palbociclib. We discussed the possible toxicities, side effects and complications of this agent and [name] had written information on it. In particular we discussed the need to follow the lab work closely at least at the beginning and I have set [place] up for lab work 2 weeks from now.  When he does have some diarrhea at times. Her daughter does not like giving her Imodium or Questran for this preferring to just let it "run its course". They had some arguments  about this and clearly there is some tension between caregiver and patient which is not uncommon but in general and they seem to communicate well.  Assuming the lab work in 2 weeks' is adequate, she will see me again in 4 weeks and monthly with each fulvestrant treatment. We will consider adding denosumab with the next dose of fulvestrant. The plan is to restage with a CT scan of the chest after a minimum of 3 months on fulvestrant.  They know to call for any other problems that may develop before the next visit here. Oluwadarasimi Favor C    10/24/2016

## 2016-11-07 ENCOUNTER — Other Ambulatory Visit (HOSPITAL_BASED_OUTPATIENT_CLINIC_OR_DEPARTMENT_OTHER): Payer: Medicare Other

## 2016-11-07 DIAGNOSIS — Z170 Estrogen receptor positive status [ER+]: Secondary | ICD-10-CM

## 2016-11-07 DIAGNOSIS — C50912 Malignant neoplasm of unspecified site of left female breast: Secondary | ICD-10-CM

## 2016-11-07 DIAGNOSIS — J410 Simple chronic bronchitis: Secondary | ICD-10-CM

## 2016-11-07 DIAGNOSIS — C50919 Malignant neoplasm of unspecified site of unspecified female breast: Secondary | ICD-10-CM

## 2016-11-07 DIAGNOSIS — C50812 Malignant neoplasm of overlapping sites of left female breast: Secondary | ICD-10-CM

## 2016-11-07 LAB — CBC WITH DIFFERENTIAL/PLATELET
BASO%: 0.6 % (ref 0.0–2.0)
BASOS ABS: 0 10*3/uL (ref 0.0–0.1)
EOS ABS: 0.1 10*3/uL (ref 0.0–0.5)
EOS%: 2.6 % (ref 0.0–7.0)
HCT: 36.5 % (ref 34.8–46.6)
HGB: 12.1 g/dL (ref 11.6–15.9)
LYMPH%: 34.7 % (ref 14.0–49.7)
MCH: 29.9 pg (ref 25.1–34.0)
MCHC: 33.2 g/dL (ref 31.5–36.0)
MCV: 89.9 fL (ref 79.5–101.0)
MONO#: 0.1 10*3/uL (ref 0.1–0.9)
MONO%: 2.7 % (ref 0.0–14.0)
NEUT#: 2.2 10*3/uL (ref 1.5–6.5)
NEUT%: 59.4 % (ref 38.4–76.8)
Platelets: 157 10*3/uL (ref 145–400)
RBC: 4.06 10*6/uL (ref 3.70–5.45)
RDW: 14 % (ref 11.2–14.5)
WBC: 3.8 10*3/uL — ABNORMAL LOW (ref 3.9–10.3)
lymph#: 1.3 10*3/uL (ref 0.9–3.3)

## 2016-11-07 LAB — COMPREHENSIVE METABOLIC PANEL
ALBUMIN: 3.9 g/dL (ref 3.5–5.0)
ALK PHOS: 72 U/L (ref 40–150)
ALT: 25 U/L (ref 0–55)
AST: 25 U/L (ref 5–34)
Anion Gap: 7 mEq/L (ref 3–11)
BUN: 39.8 mg/dL — ABNORMAL HIGH (ref 7.0–26.0)
CHLORIDE: 105 meq/L (ref 98–109)
CO2: 25 meq/L (ref 22–29)
Calcium: 9.8 mg/dL (ref 8.4–10.4)
Creatinine: 1.6 mg/dL — ABNORMAL HIGH (ref 0.6–1.1)
EGFR: 28 mL/min/{1.73_m2} — AB (ref >90)
GLUCOSE: 85 mg/dL (ref 70–140)
Potassium: 4.6 mEq/L (ref 3.5–5.1)
SODIUM: 137 meq/L (ref 136–145)
Total Bilirubin: 1.22 mg/dL — ABNORMAL HIGH (ref 0.20–1.20)
Total Protein: 6.8 g/dL (ref 6.4–8.3)

## 2016-11-08 ENCOUNTER — Telehealth: Payer: Self-pay | Admitting: Pharmacy Technician

## 2016-11-08 LAB — CANCER ANTIGEN 27.29: CA 27.29: 1320.2 U/mL — ABNORMAL HIGH (ref 0.0–38.6)

## 2016-11-08 NOTE — Telephone Encounter (Signed)
Oral Oncology Patient Advocate Encounter  Met patient in waiting room to complete application for [place] for [place] in an effort to reduce patient's out of pocket expense to $0.    Application completed and faxed to [phone].   [employer] patient assistance phone number for follow up is [phone].   This encounter will be updated until final determination.   Fabio Asa. Melynda Keller, [place] Patient [facility] [phone] 11/08/2016 3:38 PM

## 2016-11-20 NOTE — Telephone Encounter (Signed)
Oral Oncology Patient Advocate Encounter  Received notification from [place] Patient Assistance program that patient has been successfully enrolled into their program to receive [place] at $0 out-of-pocket until 05/21/2017.  Additionally, [place] informed me that her first shipment of drug would be leaving the pharmacy today (11/20/16) with expected delivery tomorrow.   I called and spoke with patient's daughter.  She voiced understanding and appreciation.    Oral Oncology Clinic will continue to follow.  Gilmore Laroche, CPhT, [place] Oral [facility]cology Patient Advocate [phone] 11/20/2016 1:32 PM

## 2016-11-20 NOTE — Progress Notes (Signed)
ID: Theresa Wallace   DOB: 16-Jun-1923  MR#: [id number]  WUJ#:[id number]  PCP: Theresa Huddle, MD GYN: SU:  OTHER MD:  CHIEF COMPLAINT:  Metastatic Breast Cancer  CURRENT TREATMENT: Exemestane, fulvestrant, palbociclib   INTERVAL HISTORY: Theresa Wallace is doing well today.  She is here along with her daughter prior to receiving Fulvestrant today.  She is on her off week of Palbociclib.  She will start the Palbociclib tomorrow.  She is slightly more fatigued with the medication, but otherwise tolerated it well this first cycle.  She continues to take Exemestane daily without any difficulty.    REVIEW OF SYSTEMS: Theresa Wallace has some chronic pain in her knees that is stable.  She has no other pain that she reports.  She does have some vague abdominal pain in her stomach she thinks that occurs after she eats "roughage".  She cannot specify any pattern, how often it occurs, or really any other details.  She is not constipated and is not having any diarrhea.  She denies nausea or vomiting.     HISTORY OF PRESENT ILLNESS: From the prior summary:  Theresa Wallace is an 83 y.o.  Theresa Wallace woman I saw briefly when she moved to this area in 2007.  She had had a left breast cancer removed at [facility] in [place] in [phone] ([place]).  It was grade 2, measured 1 cm maximally and was strongly estrogen and progesterone-receptor positive.  HER-2 was not checked.  This was T1b NX invasive ductal carcinoma, stage I, and she was treated after radiation with anastrozole for several years, eventually switching to Evista primarily for cost reasons.   More recently, she presented to Dr. Inda Wallace with fatigue and a dry cough.  He auscultated dullness to percussion at the left base and obtained a chest x-ray which confirmed the presence of a left pleural effusion.   Dr. Inda Wallace scheduled Theresa Wallace for an ultrasound-guided left thoracentesis, and this was performed November 09, 2010.  Approximately 1 L of amber-colored fluid  was removed.  Cytology from this procedure ([id number]) showed only reactive mesothelial cells.   The patient was then referred here and set up for a CT of the chest and a PET scan.  The CT of the chest on June 20th showed 2 slightly enlarged left axillary lymph nodes with some irregular contours but more importantly multiple pleural nodules in the left chest measuring up to 4.6 cm.  The right chest was normal.  There was no pericardial effusion.  There was only a small to moderate left pleural effusion remaining, and aside from left lower lobe collapse or consolidation, there was no evidence of lung parenchymal involvement.  There was no evidence of bone involvement and also no calcified pleural plaques and no worrisome bone involvement.  PET scan on June 27th showed the nodularity along the left pleura to be intensely hypermetabolic.  For example the large lobe nodule measuring 4.5 cm had an SUV max of 12.9.  There was no hypermetabolic mediastinal nodal activity.  The to left axillary lymph nodes were very mildly hypermetabolic.  The left breast was clear.  There was 1 hypermetabolic lesion in the liver measuring 3.2 cm.  On June 27th, Dr. Isaiah Wallace performed fine-needle aspiration of a 7 mm abnormal-appearing lymph node in the lower left axilla.  The cytology from this procedure, however, ([name]) also was inconclusive showing no evidence of nodal tissue, mostly blood and fatty tissue.   Her subsequent history is as detailed below  PAST MEDICAL HISTORY: Past Medical History:  Diagnosis Date  . Breast cancer metastasized to multiple sites ([place]) 06/07/2011  . Cancer ([place])    breast ca  . Hypertension   1. Hypercholesterolemia. 2. Benign tremor. 3. Sigmoid diverticulosis. 4. Onychomycosis. 5. Irritable bowel syndrome. 6. GERD. 7. History of palpitations with Holter monitor in 2008 showing PACs. 8. History of hysterectomy with no salpingo-oophorectomy. 9. History of  cholecystectomy. 10. History of bilateral knee arthroscopic surgery. History of hemorrhoid surgery.  FAMILY HISTORY [name] died in an automobile accident.  [name] died with "bone cancer."  The patient had 3 sisters.  One had breast cancer develop in her 47s.  There is also a cousin with breast cancer but no history of ovarian cancer or other history of cancer in first-degree relatives.  GYNECOLOGIC HISTORY: She is GX, P4, first pregnancy to term at age 83.  She was on the hormone replacement after her hysterectomy but only briefly and stopped when she had the initial diagnosis of breast cancer in 1996.  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  Her significant other Theresa Wallace who is a retired Chief Financial Officer is now suffering from dementia and is in a skilled nursing facility. The patient lives with her daughter Theresa Wallace who used to be a Network engineer for Dr. Inda Wallace, and Theresa Wallace husband, Theresa Wallace, who is a physician's assistant with Dr. Durward Fortes.  The patient has 12 grandchildren and 5 great-grandchildren. Incidentally Theresa Wallace has a son in [place].   ADVANCED DIRECTIVES: In place  HEALTH MAINTENANCE: (Updated 02/26/2013) Social History  Substance Use Topics  . Smoking status: Former Smoker    Quit date: 05/22/1968  . Smokeless tobacco: Never Used  . Alcohol use Yes     Colonoscopy:  PAP: s/p remote hysterectomy  Bone density:  10/23/2011, normal  Lipid panel: Dr. Inda Wallace, UTD   Allergies  Allergen Reactions  . Ciprofloxacin   . Epinephrine   . Tamoxifen   . Mupirocin Rash    Current Outpatient Prescriptions  Medication Sig Dispense Refill  . aspirin 81 MG tablet Take 81 mg by mouth daily.    . Calcium Carbonate-Vitamin D (CALCIUM-VITAMIN D) 500-200 MG-UNIT per tablet Take 1 tablet by mouth 2 (two) times daily  with a meal.    . cholecalciferol (VITAMIN D) 400 UNITS TABS Take 2,000 Units by mouth daily.    Marland Kitchen exemestane (AROMASIN) 25 MG tablet Take 1 tablet (25 mg total) by mouth daily. 90 tablet 3  . Glucosamine HCl-MSM (GLUCOSAMINE-MSM PO) Take 1 tablet by mouth daily.    Marland Kitchen levothyroxine (SYNTHROID, LEVOTHROID) 25 MCG tablet Take 25 mcg by mouth daily.    Marland Kitchen losartan (COZAAR) 50 MG tablet     . metroNIDAZOLE (METROGEL) 0.75 % gel Apply topically 2 (two) times daily. For rosacea    . Multiple Vitamin (MULTIVITAMIN) tablet Take 1 tablet by mouth daily.    . palbociclib (IBRANCE) 125 MG capsule Take 1 capsule (125 mg total) by mouth daily with breakfast. Take whole with food. 21 capsule 6  . propranolol ER (INDERAL LA) 60 MG 24 hr capsule      No current facility-administered medications for this visit.     OBJECTIVE:   Vitals:   11/21/16 1135  BP: (!) 171/61  Pulse: 63  Resp: 18  Temp: 97.7 F (36.5 C)   Body  mass index is 25.85 kg/m.  Filed Weights   11/21/16 1135  Weight: 145 lb 14.4 oz (66.2 kg)  weight a year ago was 160 pounds GENERAL: Patient is a well appearing female in no acute distress HEENT:  Sclerae anicteric.  PERRL.  Oropharynx clear and moist. No ulcerations or evidence of oropharyngeal candidiasis. Neck is supple.  NODES:  No cervical, supraclavicular, or axillary lymphadenopathy palpated.  BREAST EXAM:  Deferred. LUNGS:  Clear to auscultation bilaterally.  No wheezes or rhonchi. HEART:  Regular rate and rhythm. No murmur appreciated. ABDOMEN:  Soft, nontender.  Positive, normoactive bowel sounds. No organomegaly palpated. MSK:  No focal spinal tenderness to palpation. Full range of motion bilaterally in the upper extremities. EXTREMITIES:  No peripheral edema.   SKIN:  Clear with no obvious rashes or skin changes. No nail dyscrasia. NEURO:  Nonfocal. Well oriented.  Appropriate affect.      LAB RESULTS: Lab Results  Component Value Date   WBC 2.9 (L) 11/21/2016    NEUTROABS 1.4 (L) 11/21/2016   HGB 11.2 (L) 11/21/2016   HCT 33.2 (L) 11/21/2016   MCV 89.7 11/21/2016   PLT 146 11/21/2016      Chemistry      Component Value Date/Time   NA 136 11/21/2016 1114   K 4.8 11/21/2016 1114   CL 104 10/24/2012 1014   CO2 27 11/21/2016 1114   BUN 19.5 11/21/2016 1114   CREATININE 1.3 (H) 11/21/2016 1114      Component Value Date/Time   CALCIUM 9.6 11/21/2016 1114   ALKPHOS 64 11/21/2016 1114   AST 20 11/21/2016 1114   ALT 19 11/21/2016 1114   BILITOT 1.02 11/21/2016 1114      IMAGING STUDIES:    ASSESSMENT: 83 y.o. [place] woman:   (1) Status post left lumpectomy in 1996 for a T1b N0 grade 2 invasive ductal carcinoma which was strongly estrogen and progesterone receptor positive, treated with radiation, then anastrozole, then Evista, all treatment discontinued July 2012  METASTATIC DISEASE: (2) recurrence to the lining of the left lung and liver documented by liver biopsy July 2012, showing metastatic adenocarcinoma estrogen and progesterone receptor positive at 96% and 97% respectively; there was no Her-2 amplification.   (3)  She has been on exemestane since early July of 2012, with chest CT scan April 2013 showing complete resolution of her lung and liver metastases  (a) bone density in June of 08/09/2011 was normal  (b) bone density 03/20/2016 was normal with a T score of -0.6  (c) exemestane continued at the time of disease progression noted May 2018  (4) MRI of the lumbar spine 09-2012 showed significant degenerative disease but no evidence of metastatic disease to bone.  (5) disease progression noted May 2018: CT scan shows liver, lung, and bone lesions  (6) fulvestrant started 09/26/2016, palbociclib added 10/24/2016 at 125 mg daily, 21 days on  PLAN:  Adriannah is doing well today.  She will proceed with Fulvestrant today as she appears she is tolerating it well.  I reviewed her labs with her in detail.  Her ANC is 1400.  She will  start her second cycle of Palbociclib tomorrow.  She will need a lab only check in 2 weeks and I reviewed this with her.  I also discussed Denosumab with her and her daughter today.  I asked her to get dental clearance, and I gave them detailed information about it in her AVS for her and her daughter to read.  We reviewed  possible side effects and benefits as well.    I will call Dalisha's daughter in a few days after they have a chance to review the Xgeva.  She will return in 2 weeks for lab only, and then in 4 weeks for lab, appt with me, and Fulvestrant, +/- Xgeva.    A total of (30) minutes of face-to-face time was spent with this patient with greater than 50% of that time in counseling and care-coordination.  Scot Dock    11/22/2016

## 2016-11-21 ENCOUNTER — Ambulatory Visit (HOSPITAL_BASED_OUTPATIENT_CLINIC_OR_DEPARTMENT_OTHER): Payer: Medicare Other | Admitting: Adult Health

## 2016-11-21 ENCOUNTER — Ambulatory Visit (HOSPITAL_BASED_OUTPATIENT_CLINIC_OR_DEPARTMENT_OTHER): Payer: Medicare Other

## 2016-11-21 ENCOUNTER — Other Ambulatory Visit (HOSPITAL_BASED_OUTPATIENT_CLINIC_OR_DEPARTMENT_OTHER): Payer: Medicare Other

## 2016-11-21 VITALS — BP 171/61 | HR 63 | Temp 97.7°F | Resp 18 | Ht 63.0 in | Wt 145.9 lb

## 2016-11-21 DIAGNOSIS — C50912 Malignant neoplasm of unspecified site of left female breast: Secondary | ICD-10-CM | POA: Diagnosis not present

## 2016-11-21 DIAGNOSIS — C50812 Malignant neoplasm of overlapping sites of left female breast: Secondary | ICD-10-CM

## 2016-11-21 DIAGNOSIS — C50922 Malignant neoplasm of unspecified site of left male breast: Secondary | ICD-10-CM

## 2016-11-21 DIAGNOSIS — C50919 Malignant neoplasm of unspecified site of unspecified female breast: Secondary | ICD-10-CM

## 2016-11-21 DIAGNOSIS — C7800 Secondary malignant neoplasm of unspecified lung: Secondary | ICD-10-CM | POA: Diagnosis not present

## 2016-11-21 DIAGNOSIS — C787 Secondary malignant neoplasm of liver and intrahepatic bile duct: Secondary | ICD-10-CM

## 2016-11-21 DIAGNOSIS — Z5111 Encounter for antineoplastic chemotherapy: Secondary | ICD-10-CM

## 2016-11-21 DIAGNOSIS — M899 Disorder of bone, unspecified: Secondary | ICD-10-CM

## 2016-11-21 DIAGNOSIS — Z170 Estrogen receptor positive status [ER+]: Secondary | ICD-10-CM

## 2016-11-21 DIAGNOSIS — J410 Simple chronic bronchitis: Secondary | ICD-10-CM

## 2016-11-21 DIAGNOSIS — C7951 Secondary malignant neoplasm of bone: Secondary | ICD-10-CM | POA: Diagnosis not present

## 2016-11-21 LAB — COMPREHENSIVE METABOLIC PANEL
ALT: 19 U/L (ref 0–55)
AST: 20 U/L (ref 5–34)
Albumin: 3.7 g/dL (ref 3.5–5.0)
Alkaline Phosphatase: 64 U/L (ref 40–150)
Anion Gap: 8 mEq/L (ref 3–11)
BUN: 19.5 mg/dL (ref 7.0–26.0)
CHLORIDE: 102 meq/L (ref 98–109)
CO2: 27 meq/L (ref 22–29)
CREATININE: 1.3 mg/dL — AB (ref 0.6–1.1)
Calcium: 9.6 mg/dL (ref 8.4–10.4)
EGFR: 35 mL/min/{1.73_m2} — ABNORMAL LOW (ref >90)
GLUCOSE: 92 mg/dL (ref 70–140)
Potassium: 4.8 mEq/L (ref 3.5–5.1)
SODIUM: 136 meq/L (ref 136–145)
Total Bilirubin: 1.02 mg/dL (ref 0.20–1.20)
Total Protein: 6.7 g/dL (ref 6.4–8.3)

## 2016-11-21 LAB — CBC WITH DIFFERENTIAL/PLATELET
BASO%: 1.4 % (ref 0.0–2.0)
Basophils Absolute: 0 10*3/uL (ref 0.0–0.1)
EOS%: 1.8 % (ref 0.0–7.0)
Eosinophils Absolute: 0.1 10*3/uL (ref 0.0–0.5)
HCT: 33.2 % — ABNORMAL LOW (ref 34.8–46.6)
HGB: 11.2 g/dL — ABNORMAL LOW (ref 11.6–15.9)
LYMPH%: 36.8 % (ref 14.0–49.7)
MCH: 30.3 pg (ref 25.1–34.0)
MCHC: 33.7 g/dL (ref 31.5–36.0)
MCV: 89.7 fL (ref 79.5–101.0)
MONO#: 0.3 10*3/uL (ref 0.1–0.9)
MONO%: 9.8 % (ref 0.0–14.0)
NEUT#: 1.4 10*3/uL — ABNORMAL LOW (ref 1.5–6.5)
NEUT%: 50.2 % (ref 38.4–76.8)
Platelets: 146 10*3/uL (ref 145–400)
RBC: 3.7 10*6/uL (ref 3.70–5.45)
RDW: 13.8 % (ref 11.2–14.5)
WBC: 2.9 10*3/uL — AB (ref 3.9–10.3)
lymph#: 1.1 10*3/uL (ref 0.9–3.3)

## 2016-11-21 MED ORDER — DENOSUMAB 120 MG/1.7ML ~~LOC~~ SOLN
120.0000 mg | Freq: Once | SUBCUTANEOUS | Status: AC
  Administered 2016-11-21: 120 mg via SUBCUTANEOUS
  Filled 2016-11-21: qty 1.7

## 2016-11-21 MED ORDER — FULVESTRANT 250 MG/5ML IM SOLN
500.0000 mg | Freq: Once | INTRAMUSCULAR | Status: AC
Start: 2016-11-21 — End: 2016-11-21
  Administered 2016-11-21: 500 mg via INTRAMUSCULAR
  Filled 2016-11-21: qty 10

## 2016-11-21 NOTE — Patient Instructions (Signed)

## 2016-11-21 NOTE — Pre-Procedure Instructions (Signed)
Bone density [phone]

## 2016-11-21 NOTE — Patient Instructions (Signed)
Fulvestrant injection What is this medicine? FULVESTRANT (ful VES trant) blocks the effects of estrogen. It is used to treat breast cancer. This medicine may be used for other purposes; ask your health care provider or pharmacist if you have questions. COMMON BRAND NAME(S): FASLODEX What should I tell my health care provider before I take this medicine? They need to know if you have any of these conditions: -bleeding problems -liver disease -low levels of platelets in the blood -an unusual or allergic reaction to fulvestrant, other medicines, foods, dyes, or preservatives -pregnant or trying to get pregnant -breast-feeding How should I use this medicine? This medicine is for injection into a muscle. It is usually given by a health care professional in a hospital or clinic setting. Talk to your pediatrician regarding the use of this medicine in children. Special care may be needed. Overdosage: If you think you have taken too much of this medicine contact a poison control center or emergency room at once. NOTE: This medicine is only for you. Do not share this medicine with others. What if I miss a dose? It is important not to miss your dose. Call your doctor or health care professional if you are unable to keep an appointment. What may interact with this medicine? -medicines that treat or prevent blood clots like warfarin, enoxaparin, and dalteparin This list may not describe all possible interactions. Give your health care provider a list of all the medicines, herbs, non-prescription drugs, or dietary supplements you use. Also tell them if you smoke, drink alcohol, or use illegal drugs. Some items may interact with your medicine. What should I watch for while using this medicine? Your condition will be monitored carefully while you are receiving this medicine. You will need important blood work done while you are taking this medicine. Do not become pregnant while taking this medicine or for  at least 1 year after stopping it. Women of child-bearing potential will need to have a negative pregnancy test before starting this medicine. Women should inform their doctor if they wish to become pregnant or think they might be pregnant. There is a potential for serious side effects to an unborn child. Men should inform their doctors if they wish to father a child. This medicine may lower sperm counts. Talk to your health care professional or pharmacist for more information. Do not breast-feed an infant while taking this medicine or for 1 year after the last dose. What side effects may I notice from receiving this medicine? Side effects that you should report to your doctor or health care professional as soon as possible: -allergic reactions like skin rash, itching or hives, swelling of the face, lips, or tongue -feeling faint or lightheaded, falls -pain, tingling, numbness, or weakness in the legs -signs and symptoms of infection like fever or chills; cough; flu-like symptoms; sore throat -vaginal bleeding Side effects that usually do not require medical attention (report to your doctor or health care professional if they continue or are bothersome): -aches, pains -constipation -diarrhea -headache -hot flashes -nausea, vomiting -pain at site where injected -stomach pain This list may not describe all possible side effects. Call your doctor for medical advice about side effects. You may report side effects to FDA at 1-800-FDA-1088. Where should I keep my medicine? This drug is given in a hospital or clinic and will not be stored at home. NOTE: This sheet is a summary. It may not cover all possible information. If you have questions about this medicine, talk to your   doctor, pharmacist, or health care provider.  2018 Elsevier/Gold Standard (2014-12-04 11:03:55) Denosumab injection What is this medicine? DENOSUMAB (den oh sue mab) slows bone breakdown. Prolia is used to treat osteoporosis in  women after menopause and in men. Delton See is used to treat a high calcium level due to cancer and to prevent bone fractures and other bone problems caused by multiple myeloma or cancer bone metastases. Delton See is also used to treat giant cell tumor of the bone. This medicine may be used for other purposes; ask your health care provider or pharmacist if you have questions. COMMON BRAND NAME(S): Prolia, XGEVA What should I tell my health care provider before I take this medicine? They need to know if you have any of these conditions: -dental disease -having surgery or tooth extraction -infection -kidney disease -low levels of calcium or Vitamin D in the blood -malnutrition -on hemodialysis -skin conditions or sensitivity -thyroid or parathyroid disease -an unusual reaction to denosumab, other medicines, foods, dyes, or preservatives -pregnant or trying to get pregnant -breast-feeding How should I use this medicine? This medicine is for injection under the skin. It is given by a health care professional in a hospital or clinic setting. If you are getting Prolia, a special MedGuide will be given to you by the pharmacist with each prescription and refill. Be sure to read this information carefully each time. For Prolia, talk to your pediatrician regarding the use of this medicine in children. Special care may be needed. For Delton See, talk to your pediatrician regarding the use of this medicine in children. While this drug may be prescribed for children as young as 13 years for selected conditions, precautions do apply. Overdosage: If you think you have taken too much of this medicine contact a poison control center or emergency room at once. NOTE: This medicine is only for you. Do not share this medicine with others. What if I miss a dose? It is important not to miss your dose. Call your doctor or health care professional if you are unable to keep an appointment. What may interact with this  medicine? Do not take this medicine with any of the following medications: -other medicines containing denosumab This medicine may also interact with the following medications: -medicines that lower your chance of fighting infection -steroid medicines like prednisone or cortisone This list may not describe all possible interactions. Give your health care provider a list of all the medicines, herbs, non-prescription drugs, or dietary supplements you use. Also tell them if you smoke, drink alcohol, or use illegal drugs. Some items may interact with your medicine. What should I watch for while using this medicine? Visit your doctor or health care professional for regular checks on your progress. Your doctor or health care professional may order blood tests and other tests to see how you are doing. Call your doctor or health care professional for advice if you get a fever, chills or sore throat, or other symptoms of a cold or flu. Do not treat yourself. This drug may decrease your body's ability to fight infection. Try to avoid being around people who are sick. You should make sure you get enough calcium and vitamin D while you are taking this medicine, unless your doctor tells you not to. Discuss the foods you eat and the vitamins you take with your health care professional. See your dentist regularly. Brush and floss your teeth as directed. Before you have any dental work done, tell your dentist you are receiving this medicine. Do  not become pregnant while taking this medicine or for 5 months after stopping it. Talk with your doctor or health care professional about your birth control options while taking this medicine. Women should inform their doctor if they wish to become pregnant or think they might be pregnant. There is a potential for serious side effects to an unborn child. Talk to your health care professional or pharmacist for more information. What side effects may I notice from receiving this  medicine? Side effects that you should report to your doctor or health care professional as soon as possible: -allergic reactions like skin rash, itching or hives, swelling of the face, lips, or tongue -bone pain -breathing problems -dizziness -jaw pain, especially after dental work -redness, blistering, peeling of the skin -signs and symptoms of infection like fever or chills; cough; sore throat; pain or trouble passing urine -signs of low calcium like fast heartbeat, muscle cramps or muscle pain; pain, tingling, numbness in the hands or feet; seizures -unusual bleeding or bruising -unusually weak or tired Side effects that usually do not require medical attention (report to your doctor or health care professional if they continue or are bothersome): -constipation -diarrhea -headache -joint pain -loss of appetite -muscle pain -runny nose -tiredness -upset stomach This list may not describe all possible side effects. Call your doctor for medical advice about side effects. You may report side effects to FDA at 1-800-FDA-1088. Where should I keep my medicine? This medicine is only given in a clinic, doctor's office, or other health care setting and will not be stored at home. NOTE: This sheet is a summary. It may not cover all possible information. If you have questions about this medicine, talk to your doctor, pharmacist, or health care provider.  2018 Elsevier/Gold Standard (2016-05-30 19:17:21)  

## 2016-11-22 LAB — CANCER ANTIGEN 27.29

## 2016-11-23 ENCOUNTER — Encounter: Payer: Self-pay | Admitting: Adult Health

## 2016-11-23 ENCOUNTER — Telehealth: Payer: Self-pay | Admitting: *Deleted

## 2016-11-23 NOTE — Telephone Encounter (Signed)
Per Mendel Ryder, NP spoke with patient's daughter informing her that Magrinat, MD wants patient to receive X-geva monthly. Patient's daughter verbalized understanding. Also patient's daughter stated "last visit Mendel Ryder, NP stated that patient needs dental clearance. Patient had her teeth cleaned a couple months ago, does she need another appointment with dentist?"  Per Mendel Ryder, NP if patient's daughter can have dentist send patient last dental visit record that would be good. Patient's daughter verbalized understanding, and fax number was given.

## 2016-12-05 ENCOUNTER — Other Ambulatory Visit (HOSPITAL_BASED_OUTPATIENT_CLINIC_OR_DEPARTMENT_OTHER): Payer: Medicare Other

## 2016-12-05 DIAGNOSIS — C7802 Secondary malignant neoplasm of left lung: Secondary | ICD-10-CM | POA: Diagnosis not present

## 2016-12-05 DIAGNOSIS — C50919 Malignant neoplasm of unspecified site of unspecified female breast: Secondary | ICD-10-CM

## 2016-12-05 DIAGNOSIS — C50912 Malignant neoplasm of unspecified site of left female breast: Secondary | ICD-10-CM | POA: Diagnosis not present

## 2016-12-05 DIAGNOSIS — Z170 Estrogen receptor positive status [ER+]: Secondary | ICD-10-CM

## 2016-12-05 DIAGNOSIS — C787 Secondary malignant neoplasm of liver and intrahepatic bile duct: Secondary | ICD-10-CM | POA: Diagnosis not present

## 2016-12-05 DIAGNOSIS — C50812 Malignant neoplasm of overlapping sites of left female breast: Secondary | ICD-10-CM

## 2016-12-05 DIAGNOSIS — J410 Simple chronic bronchitis: Secondary | ICD-10-CM

## 2016-12-05 LAB — COMPREHENSIVE METABOLIC PANEL
ALT: 13 U/L (ref 0–55)
ANION GAP: 8 meq/L (ref 3–11)
AST: 17 U/L (ref 5–34)
Albumin: 3.8 g/dL (ref 3.5–5.0)
Alkaline Phosphatase: 60 U/L (ref 40–150)
BILIRUBIN TOTAL: 1.18 mg/dL (ref 0.20–1.20)
BUN: 30 mg/dL — ABNORMAL HIGH (ref 7.0–26.0)
CHLORIDE: 103 meq/L (ref 98–109)
CO2: 25 meq/L (ref 22–29)
Calcium: 9.5 mg/dL (ref 8.4–10.4)
Creatinine: 1.5 mg/dL — ABNORMAL HIGH (ref 0.6–1.1)
EGFR: 29 mL/min/{1.73_m2} — AB (ref >90)
Glucose: 104 mg/dl (ref 70–140)
Potassium: 5 mEq/L (ref 3.5–5.1)
Sodium: 136 mEq/L (ref 136–145)
TOTAL PROTEIN: 6.8 g/dL (ref 6.4–8.3)

## 2016-12-05 LAB — CBC WITH DIFFERENTIAL/PLATELET
BASO%: 2.5 % — ABNORMAL HIGH (ref 0.0–2.0)
Basophils Absolute: 0.1 10*3/uL (ref 0.0–0.1)
EOS ABS: 0.1 10*3/uL (ref 0.0–0.5)
EOS%: 1.6 % (ref 0.0–7.0)
HCT: 33.7 % — ABNORMAL LOW (ref 34.8–46.6)
HGB: 11.2 g/dL — ABNORMAL LOW (ref 11.6–15.9)
LYMPH%: 33.3 % (ref 14.0–49.7)
MCH: 30.8 pg (ref 25.1–34.0)
MCHC: 33.2 g/dL (ref 31.5–36.0)
MCV: 92.6 fL (ref 79.5–101.0)
MONO#: 0.1 10*3/uL (ref 0.1–0.9)
MONO%: 3.4 % (ref 0.0–14.0)
NEUT#: 1.9 10*3/uL (ref 1.5–6.5)
NEUT%: 59.2 % (ref 38.4–76.8)
Platelets: 240 10*3/uL (ref 145–400)
RBC: 3.64 10*6/uL — AB (ref 3.70–5.45)
RDW: 16.3 % — ABNORMAL HIGH (ref 11.2–14.5)
WBC: 3.2 10*3/uL — ABNORMAL LOW (ref 3.9–10.3)
lymph#: 1.1 10*3/uL (ref 0.9–3.3)

## 2016-12-06 LAB — CANCER ANTIGEN 27.29: CA 27.29: 852.4 U/mL — ABNORMAL HIGH (ref 0.0–38.6)

## 2016-12-18 NOTE — Progress Notes (Signed)
ID: Theresa Wallace   DOB: 02-09-1924  MR#: [id number]  IZT#:[id number]  PCP: [name], MD GYN: SU:  OTHER MD:  CHIEF COMPLAINT:  Metastatic Breast Cancer  CURRENT TREATMENT: Exemestane, fulvestrant, palbociclib, Delton See   INTERVAL HISTORY: Theresa Wallace is doing well today.  She continues to be fatigued and this isn't worse since starting Palbociclib.  She is here with her daughter who tells [place] that her naps are slightly longer than they were previously.  She continues on Exemestane without difficulty.  She will start her third month of Palbociclib tomorrow.  She will receive Fulvestrant and Xgeva today.    REVIEW OF SYSTEMS: Theresa Wallace is doing well.  A detailed ROS was conducted and is non contributory.     HISTORY OF PRESENT ILLNESS: From the prior summary:  Theresa Wallace is an 83 y.o.  [place] woman I saw briefly when she moved to this area in 2007.  She had had a left breast cancer removed at [facility] in [place] in [phone] ([place]).  It was grade 2, measured 1 cm maximally and was strongly estrogen and progesterone-receptor positive.  HER-2 was not checked.  This was T1b NX invasive ductal carcinoma, stage I, and she was treated after radiation with anastrozole for several years, eventually switching to Evista primarily for cost reasons.   More recently, she presented to Dr. Inda Merlin with fatigue and a dry cough.  He auscultated dullness to percussion at the left base and obtained a chest x-ray which confirmed the presence of a left pleural effusion.   Dr. Inda Merlin scheduled Theresa Wallace for an ultrasound-guided left thoracentesis, and this was performed November 09, 2010.  Approximately 1 L of amber-colored fluid was removed.  Cytology from this procedure ([id number]) showed only reactive mesothelial cells.   The patient was then referred here and set up for a CT of the chest and a PET scan.  The CT of the chest on June 20th showed 2 slightly enlarged left axillary lymph nodes with  some irregular contours but more importantly multiple pleural nodules in the left chest measuring up to 4.6 cm.  The right chest was normal.  There was no pericardial effusion.  There was only a small to moderate left pleural effusion remaining, and aside from left lower lobe collapse or consolidation, there was no evidence of lung parenchymal involvement.  There was no evidence of bone involvement and also no calcified pleural plaques and no worrisome bone involvement.  PET scan on June 27th showed the nodularity along the left pleura to be intensely hypermetabolic.  For example the large lobe nodule measuring 4.5 cm had an SUV max of 12.9.  There was no hypermetabolic mediastinal nodal activity.  The to left axillary lymph nodes were very mildly hypermetabolic.  The left breast was clear.  There was 1 hypermetabolic lesion in the liver measuring 3.2 cm.  On June 27th, Dr. [name] performed fine-needle aspiration of a 7 mm abnormal-appearing lymph node in the lower left axilla.  The cytology from this procedure, however, ([name]) also was inconclusive showing no evidence of nodal tissue, mostly blood and fatty tissue.   Her subsequent history is as detailed below   PAST MEDICAL HISTORY: Past Medical History:  Diagnosis Date  . Breast cancer metastasized to multiple sites ([place]) 06/07/2011  . Cancer ([place])    breast ca  . Hypertension   1. Hypercholesterolemia. 2. Benign tremor. 3. Sigmoid diverticulosis. 4. Onychomycosis. 5. Irritable bowel syndrome. 6. GERD. 7. History of palpitations with  Holter monitor in 2008 showing PACs. 8. History of hysterectomy with no salpingo-oophorectomy. 9. History of cholecystectomy. 10. History of bilateral knee arthroscopic surgery. History of hemorrhoid surgery.  FAMILY HISTORY [name] died in an automobile accident.  [name] died with "bone cancer."  The patient had 3 sisters.  One had breast cancer develop in her 64s.  There is  also a cousin with breast cancer but no history of ovarian cancer or other history of cancer in first-degree relatives.  GYNECOLOGIC HISTORY: She is GX, P4, first pregnancy to term at age 38.  She was on the hormone replacement after her hysterectomy but only briefly and stopped when she had the initial diagnosis of breast cancer in 1996.  SOCIAL HISTORY: (Updated May 2018) She worked as a Network engineer.  She has been widowed since 2005, her husband [name] dying after a long illness involving strokes and a myocardial infarction.  Her significant other [name] who is a retired Chief Financial Officer is now suffering from dementia and is in a skilled nursing facility. The patient lives with her daughter [name] who used to be a Network engineer for Dr. Inda Merlin, and [name]'s husband, [name], who is a physician's assistant with Dr. Durward Fortes.  The patient has 12 grandchildren and 5 great-grandchildren. Incidentally [name] has a son in [place].   ADVANCED DIRECTIVES: In place  HEALTH MAINTENANCE: (Updated 02/26/2013) Social History  Substance Use Topics  . Smoking status: Former Smoker    Quit date: 05/22/1968  . Smokeless tobacco: Never Used  . Alcohol use Yes     Colonoscopy:  PAP: s/p remote hysterectomy  Bone density:  10/23/2011, normal  Lipid panel: Dr. Inda Merlin, UTD   Allergies  Allergen Reactions  . Ciprofloxacin   . Epinephrine   . Tamoxifen   . Mupirocin Rash    Current Outpatient Prescriptions  Medication Sig Dispense Refill  . aspirin 81 MG tablet Take 81 mg by mouth daily.    . Calcium Carbonate-Vitamin D (CALCIUM-VITAMIN D) 500-200 MG-UNIT per tablet Take 1 tablet by mouth 2 (two) times daily with a meal.    . cholecalciferol (VITAMIN D) 400 UNITS TABS Take 2,000 Units by mouth daily.    Marland Kitchen exemestane (AROMASIN) 25 MG tablet Take 1 tablet (25 mg total) by mouth daily. 90 tablet 3  . Glucosamine HCl-MSM (GLUCOSAMINE-MSM PO) Take 1 tablet by mouth daily.    Marland Kitchen levothyroxine (SYNTHROID,  LEVOTHROID) 25 MCG tablet Take 25 mcg by mouth daily.    Marland Kitchen losartan (COZAAR) 50 MG tablet     . metroNIDAZOLE (METROGEL) 0.75 % gel Apply topically 2 (two) times daily. For rosacea    . Multiple Vitamin (MULTIVITAMIN) tablet Take 1 tablet by mouth daily.    . palbociclib (IBRANCE) 125 MG capsule Take 1 capsule (125 mg total) by mouth daily with breakfast. Take whole with food. 21 capsule 6  . propranolol ER (INDERAL LA) 60 MG 24 hr capsule      No current facility-administered medications for this visit.     OBJECTIVE:   Vitals:   12/19/16 1043  BP: (!) 134/56  Pulse: 65  Resp: 17  Temp: 97.7 F (36.5 C)   Body mass index is 26.41 kg/m.  Filed Weights   12/19/16 1043  Weight: 149 lb 1.6 oz (67.6 kg)  weight a year ago was 160 pounds GENERAL: Patient is a well appearing female in no acute distress HEENT:  Sclerae anicteric.  PERRL.  Oropharynx clear and moist. No ulcerations or evidence  of oropharyngeal candidiasis. Neck is supple.  NODES:  No cervical, supraclavicular, or axillary lymphadenopathy palpated.  BREAST EXAM:  Deferred. LUNGS:  Clear to auscultation bilaterally.  No wheezes or rhonchi. HEART:  Regular rate and rhythm. No murmur appreciated. ABDOMEN:  Soft, nontender.  Positive, normoactive bowel sounds. No organomegaly palpated. MSK:  No focal spinal tenderness to palpation. Full range of motion bilaterally in the upper extremities. EXTREMITIES:  No peripheral edema.   SKIN:  Clear with no obvious rashes or skin changes. No nail dyscrasia. NEURO:  Nonfocal. Well oriented.  Appropriate affect.      LAB RESULTS: Lab Results  Component Value Date   WBC 2.8 (L) 12/19/2016   NEUTROABS 1.6 12/19/2016   HGB 10.7 (L) 12/19/2016   HCT 31.1 (L) 12/19/2016   MCV 92.7 12/19/2016   PLT 188 12/19/2016      Chemistry      Component Value Date/Time   NA 133 (L) 12/19/2016 1007   K 4.4 12/19/2016 1007   CL 104 10/24/2012 1014   CO2 25 12/19/2016 1007   BUN 20.3  12/19/2016 1007   CREATININE 1.3 (H) 12/19/2016 1007      Component Value Date/Time   CALCIUM 9.3 12/19/2016 1007   ALKPHOS 50 12/19/2016 1007   AST 16 12/19/2016 1007   ALT 12 12/19/2016 1007   BILITOT 1.17 12/19/2016 1007      IMAGING STUDIES:    ASSESSMENT: 83 y.o. [place] woman:   (1) Status post left lumpectomy in 1996 for a T1b N0 grade 2 invasive ductal carcinoma which was strongly estrogen and progesterone receptor positive, treated with radiation, then anastrozole, then Evista, all treatment discontinued July 2012  METASTATIC DISEASE: (2) recurrence to the lining of the left lung and liver documented by liver biopsy July 2012, showing metastatic adenocarcinoma estrogen and progesterone receptor positive at 96% and 97% respectively; there was no Her-2 amplification.   (3)  She has been on exemestane since early July of 2012, with chest CT scan April 2013 showing complete resolution of her lung and liver metastases  (a) bone density in June of 08/09/2011 was normal  (b) bone density 03/20/2016 was normal with a T score of -0.6  (c) exemestane continued at the time of disease progression noted May 2018  (4) MRI of the lumbar spine 09-2012 showed significant degenerative disease but no evidence of metastatic disease to bone.  (5) disease progression noted May 2018: CT scan shows liver, lung, and bone lesions  (6) fulvestrant started 09/26/2016, palbociclib added 10/24/2016 at 125 mg daily, 21 days on  PLAN:  Khamille is doing well today.  Her labs are stable and I reviewed them in detail with her along with her daughter.  She will continue on Palbociclib, Exemestane.  She will proceed with Fulvestrant and Xgeva today.  She is doing well today.  She will see Dr. Jana Hakim in 4 weeks prior to her next Fulvestrant and Xgeva injections.  I have ordered a CT chest for restaging prior to that appt.    A total of (30) minutes of face-to-face time was spent with this patient with  greater than 50% of that time in counseling and care-coordination.  Scot Dock    12/19/2016

## 2016-12-19 ENCOUNTER — Other Ambulatory Visit (HOSPITAL_BASED_OUTPATIENT_CLINIC_OR_DEPARTMENT_OTHER): Payer: Medicare Other

## 2016-12-19 ENCOUNTER — Ambulatory Visit (HOSPITAL_BASED_OUTPATIENT_CLINIC_OR_DEPARTMENT_OTHER): Payer: Medicare Other | Admitting: Adult Health

## 2016-12-19 ENCOUNTER — Encounter: Payer: Self-pay | Admitting: Adult Health

## 2016-12-19 ENCOUNTER — Ambulatory Visit (HOSPITAL_BASED_OUTPATIENT_CLINIC_OR_DEPARTMENT_OTHER): Payer: Medicare Other

## 2016-12-19 VITALS — BP 134/56 | HR 65 | Temp 97.7°F | Resp 17 | Ht 63.0 in | Wt 149.1 lb

## 2016-12-19 DIAGNOSIS — C50812 Malignant neoplasm of overlapping sites of left female breast: Secondary | ICD-10-CM

## 2016-12-19 DIAGNOSIS — C50912 Malignant neoplasm of unspecified site of left female breast: Secondary | ICD-10-CM

## 2016-12-19 DIAGNOSIS — M899 Disorder of bone, unspecified: Secondary | ICD-10-CM

## 2016-12-19 DIAGNOSIS — Z170 Estrogen receptor positive status [ER+]: Secondary | ICD-10-CM

## 2016-12-19 DIAGNOSIS — C787 Secondary malignant neoplasm of liver and intrahepatic bile duct: Secondary | ICD-10-CM

## 2016-12-19 DIAGNOSIS — C50919 Malignant neoplasm of unspecified site of unspecified female breast: Secondary | ICD-10-CM

## 2016-12-19 DIAGNOSIS — Z5111 Encounter for antineoplastic chemotherapy: Secondary | ICD-10-CM

## 2016-12-19 DIAGNOSIS — C7802 Secondary malignant neoplasm of left lung: Secondary | ICD-10-CM

## 2016-12-19 DIAGNOSIS — C50922 Malignant neoplasm of unspecified site of left male breast: Secondary | ICD-10-CM

## 2016-12-19 DIAGNOSIS — J410 Simple chronic bronchitis: Secondary | ICD-10-CM

## 2016-12-19 LAB — CBC WITH DIFFERENTIAL/PLATELET
BASO%: 2.6 % — ABNORMAL HIGH (ref 0.0–2.0)
BASOS ABS: 0.1 10*3/uL (ref 0.0–0.1)
EOS ABS: 0 10*3/uL (ref 0.0–0.5)
EOS%: 1.3 % (ref 0.0–7.0)
HCT: 31.1 % — ABNORMAL LOW (ref 34.8–46.6)
HGB: 10.7 g/dL — ABNORMAL LOW (ref 11.6–15.9)
LYMPH%: 27.1 % (ref 14.0–49.7)
MCH: 31.8 pg (ref 25.1–34.0)
MCHC: 34.3 g/dL (ref 31.5–36.0)
MCV: 92.7 fL (ref 79.5–101.0)
MONO#: 0.4 10*3/uL (ref 0.1–0.9)
MONO%: 13.4 % (ref 0.0–14.0)
NEUT#: 1.6 10*3/uL (ref 1.5–6.5)
NEUT%: 55.6 % (ref 38.4–76.8)
Platelets: 188 10*3/uL (ref 145–400)
RBC: 3.36 10*6/uL — AB (ref 3.70–5.45)
RDW: 20.7 % — ABNORMAL HIGH (ref 11.2–14.5)
WBC: 2.8 10*3/uL — ABNORMAL LOW (ref 3.9–10.3)
lymph#: 0.8 10*3/uL — ABNORMAL LOW (ref 0.9–3.3)

## 2016-12-19 LAB — COMPREHENSIVE METABOLIC PANEL
ALBUMIN: 3.6 g/dL (ref 3.5–5.0)
ALK PHOS: 50 U/L (ref 40–150)
ALT: 12 U/L (ref 0–55)
ANION GAP: 9 meq/L (ref 3–11)
AST: 16 U/L (ref 5–34)
BILIRUBIN TOTAL: 1.17 mg/dL (ref 0.20–1.20)
BUN: 20.3 mg/dL (ref 7.0–26.0)
CO2: 25 mEq/L (ref 22–29)
CREATININE: 1.3 mg/dL — AB (ref 0.6–1.1)
Calcium: 9.3 mg/dL (ref 8.4–10.4)
Chloride: 98 mEq/L (ref 98–109)
EGFR: 37 mL/min/{1.73_m2} — AB (ref >90)
Glucose: 109 mg/dl (ref 70–140)
Potassium: 4.4 mEq/L (ref 3.5–5.1)
Sodium: 133 mEq/L — ABNORMAL LOW (ref 136–145)
TOTAL PROTEIN: 6.5 g/dL (ref 6.4–8.3)

## 2016-12-19 MED ORDER — FULVESTRANT 250 MG/5ML IM SOLN
500.0000 mg | Freq: Once | INTRAMUSCULAR | Status: AC
  Administered 2016-12-19: 500 mg via INTRAMUSCULAR
  Filled 2016-12-19: qty 10

## 2016-12-19 MED ORDER — DENOSUMAB 120 MG/1.7ML ~~LOC~~ SOLN
120.0000 mg | Freq: Once | SUBCUTANEOUS | Status: AC
  Administered 2016-12-19: 120 mg via SUBCUTANEOUS
  Filled 2016-12-19: qty 1.7

## 2016-12-19 NOTE — Patient Instructions (Signed)
Fulvestrant injection What is this medicine? FULVESTRANT (ful VES trant) blocks the effects of estrogen. It is used to treat breast cancer. This medicine may be used for other purposes; ask your health care provider or pharmacist if you have questions. COMMON BRAND NAME(S): FASLODEX What should I tell my health care provider before I take this medicine? They need to know if you have any of these conditions: -bleeding problems -liver disease -low levels of platelets in the blood -an unusual or allergic reaction to fulvestrant, other medicines, foods, dyes, or preservatives -pregnant or trying to get pregnant -breast-feeding How should I use this medicine? This medicine is for injection into a muscle. It is usually given by a health care professional in a hospital or clinic setting. Talk to your pediatrician regarding the use of this medicine in children. Special care may be needed. Overdosage: If you think you have taken too much of this medicine contact a poison control center or emergency room at once. NOTE: This medicine is only for you. Do not share this medicine with others. What if I miss a dose? It is important not to miss your dose. Call your doctor or health care professional if you are unable to keep an appointment. What may interact with this medicine? -medicines that treat or prevent blood clots like warfarin, enoxaparin, and dalteparin This list may not describe all possible interactions. Give your health care provider a list of all the medicines, herbs, non-prescription drugs, or dietary supplements you use. Also tell them if you smoke, drink alcohol, or use illegal drugs. Some items may interact with your medicine. What should I watch for while using this medicine? Your condition will be monitored carefully while you are receiving this medicine. You will need important blood work done while you are taking this medicine. Do not become pregnant while taking this medicine or for  at least 1 year after stopping it. Women of child-bearing potential will need to have a negative pregnancy test before starting this medicine. Women should inform their doctor if they wish to become pregnant or think they might be pregnant. There is a potential for serious side effects to an unborn child. Men should inform their doctors if they wish to father a child. This medicine may lower sperm counts. Talk to your health care professional or pharmacist for more information. Do not breast-feed an infant while taking this medicine or for 1 year after the last dose. What side effects may I notice from receiving this medicine? Side effects that you should report to your doctor or health care professional as soon as possible: -allergic reactions like skin rash, itching or hives, swelling of the face, lips, or tongue -feeling faint or lightheaded, falls -pain, tingling, numbness, or weakness in the legs -signs and symptoms of infection like fever or chills; cough; flu-like symptoms; sore throat -vaginal bleeding Side effects that usually do not require medical attention (report to your doctor or health care professional if they continue or are bothersome): -aches, pains -constipation -diarrhea -headache -hot flashes -nausea, vomiting -pain at site where injected -stomach pain This list may not describe all possible side effects. Call your doctor for medical advice about side effects. You may report side effects to FDA at 1-800-FDA-1088. Where should I keep my medicine? This drug is given in a hospital or clinic and will not be stored at home. NOTE: This sheet is a summary. It may not cover all possible information. If you have questions about this medicine, talk to your   doctor, pharmacist, or health care provider.  2018 Elsevier/Gold Standard (2014-12-04 11:03:55) Denosumab injection What is this medicine? DENOSUMAB (den oh sue mab) slows bone breakdown. Prolia is used to treat osteoporosis in  women after menopause and in men. Delton See is used to treat a high calcium level due to cancer and to prevent bone fractures and other bone problems caused by multiple myeloma or cancer bone metastases. Delton See is also used to treat giant cell tumor of the bone. This medicine may be used for other purposes; ask your health care provider or pharmacist if you have questions. COMMON BRAND NAME(S): Prolia, XGEVA What should I tell my health care provider before I take this medicine? They need to know if you have any of these conditions: -dental disease -having surgery or tooth extraction -infection -kidney disease -low levels of calcium or Vitamin D in the blood -malnutrition -on hemodialysis -skin conditions or sensitivity -thyroid or parathyroid disease -an unusual reaction to denosumab, other medicines, foods, dyes, or preservatives -pregnant or trying to get pregnant -breast-feeding How should I use this medicine? This medicine is for injection under the skin. It is given by a health care professional in a hospital or clinic setting. If you are getting Prolia, a special MedGuide will be given to you by the pharmacist with each prescription and refill. Be sure to read this information carefully each time. For Prolia, talk to your pediatrician regarding the use of this medicine in children. Special care may be needed. For Delton See, talk to your pediatrician regarding the use of this medicine in children. While this drug may be prescribed for children as young as 13 years for selected conditions, precautions do apply. Overdosage: If you think you have taken too much of this medicine contact a poison control center or emergency room at once. NOTE: This medicine is only for you. Do not share this medicine with others. What if I miss a dose? It is important not to miss your dose. Call your doctor or health care professional if you are unable to keep an appointment. What may interact with this  medicine? Do not take this medicine with any of the following medications: -other medicines containing denosumab This medicine may also interact with the following medications: -medicines that lower your chance of fighting infection -steroid medicines like prednisone or cortisone This list may not describe all possible interactions. Give your health care provider a list of all the medicines, herbs, non-prescription drugs, or dietary supplements you use. Also tell them if you smoke, drink alcohol, or use illegal drugs. Some items may interact with your medicine. What should I watch for while using this medicine? Visit your doctor or health care professional for regular checks on your progress. Your doctor or health care professional may order blood tests and other tests to see how you are doing. Call your doctor or health care professional for advice if you get a fever, chills or sore throat, or other symptoms of a cold or flu. Do not treat yourself. This drug may decrease your body's ability to fight infection. Try to avoid being around people who are sick. You should make sure you get enough calcium and vitamin D while you are taking this medicine, unless your doctor tells you not to. Discuss the foods you eat and the vitamins you take with your health care professional. See your dentist regularly. Brush and floss your teeth as directed. Before you have any dental work done, tell your dentist you are receiving this medicine. Do  not become pregnant while taking this medicine or for 5 months after stopping it. Talk with your doctor or health care professional about your birth control options while taking this medicine. Women should inform their doctor if they wish to become pregnant or think they might be pregnant. There is a potential for serious side effects to an unborn child. Talk to your health care professional or pharmacist for more information. What side effects may I notice from receiving this  medicine? Side effects that you should report to your doctor or health care professional as soon as possible: -allergic reactions like skin rash, itching or hives, swelling of the face, lips, or tongue -bone pain -breathing problems -dizziness -jaw pain, especially after dental work -redness, blistering, peeling of the skin -signs and symptoms of infection like fever or chills; cough; sore throat; pain or trouble passing urine -signs of low calcium like fast heartbeat, muscle cramps or muscle pain; pain, tingling, numbness in the hands or feet; seizures -unusual bleeding or bruising -unusually weak or tired Side effects that usually do not require medical attention (report to your doctor or health care professional if they continue or are bothersome): -constipation -diarrhea -headache -joint pain -loss of appetite -muscle pain -runny nose -tiredness -upset stomach This list may not describe all possible side effects. Call your doctor for medical advice about side effects. You may report side effects to FDA at 1-800-FDA-1088. Where should I keep my medicine? This medicine is only given in a clinic, doctor's office, or other health care setting and will not be stored at home. NOTE: This sheet is a summary. It may not cover all possible information. If you have questions about this medicine, talk to your doctor, pharmacist, or health care provider.  2018 Elsevier/Gold Standard (2016-05-30 19:17:21)  

## 2016-12-20 LAB — CANCER ANTIGEN 27.29: CA 27.29: 680.2 U/mL — ABNORMAL HIGH (ref 0.0–38.6)

## 2017-01-02 ENCOUNTER — Other Ambulatory Visit (HOSPITAL_BASED_OUTPATIENT_CLINIC_OR_DEPARTMENT_OTHER): Payer: Medicare Other

## 2017-01-02 DIAGNOSIS — C787 Secondary malignant neoplasm of liver and intrahepatic bile duct: Secondary | ICD-10-CM

## 2017-01-02 DIAGNOSIS — C50912 Malignant neoplasm of unspecified site of left female breast: Secondary | ICD-10-CM | POA: Diagnosis not present

## 2017-01-02 DIAGNOSIS — C7802 Secondary malignant neoplasm of left lung: Secondary | ICD-10-CM

## 2017-01-02 DIAGNOSIS — C50919 Malignant neoplasm of unspecified site of unspecified female breast: Secondary | ICD-10-CM

## 2017-01-02 DIAGNOSIS — J410 Simple chronic bronchitis: Secondary | ICD-10-CM

## 2017-01-02 DIAGNOSIS — C50812 Malignant neoplasm of overlapping sites of left female breast: Secondary | ICD-10-CM

## 2017-01-02 DIAGNOSIS — Z170 Estrogen receptor positive status [ER+]: Secondary | ICD-10-CM

## 2017-01-02 LAB — COMPREHENSIVE METABOLIC PANEL
ALT: 15 U/L (ref 0–55)
ANION GAP: 8 meq/L (ref 3–11)
AST: 19 U/L (ref 5–34)
Albumin: 3.9 g/dL (ref 3.5–5.0)
Alkaline Phosphatase: 53 U/L (ref 40–150)
BUN: 28.1 mg/dL — ABNORMAL HIGH (ref 7.0–26.0)
CALCIUM: 9.4 mg/dL (ref 8.4–10.4)
CO2: 25 meq/L (ref 22–29)
Chloride: 96 mEq/L — ABNORMAL LOW (ref 98–109)
Creatinine: 1.5 mg/dL — ABNORMAL HIGH (ref 0.6–1.1)
EGFR: 31 mL/min/{1.73_m2} — AB (ref >90)
GLUCOSE: 118 mg/dL (ref 70–140)
POTASSIUM: 5 meq/L (ref 3.5–5.1)
Sodium: 129 mEq/L — ABNORMAL LOW (ref 136–145)
TOTAL PROTEIN: 6.9 g/dL (ref 6.4–8.3)
Total Bilirubin: 1.33 mg/dL — ABNORMAL HIGH (ref 0.20–1.20)

## 2017-01-02 LAB — CBC WITH DIFFERENTIAL/PLATELET
BASO%: 2.5 % — AB (ref 0.0–2.0)
Basophils Absolute: 0.1 10*3/uL (ref 0.0–0.1)
EOS ABS: 0.1 10*3/uL (ref 0.0–0.5)
EOS%: 2.4 % (ref 0.0–7.0)
HEMATOCRIT: 32.5 % — AB (ref 34.8–46.6)
HGB: 11.3 g/dL — ABNORMAL LOW (ref 11.6–15.9)
LYMPH#: 1 10*3/uL (ref 0.9–3.3)
LYMPH%: 29.7 % (ref 14.0–49.7)
MCH: 33.1 pg (ref 25.1–34.0)
MCHC: 34.8 g/dL (ref 31.5–36.0)
MCV: 95.1 fL (ref 79.5–101.0)
MONO#: 0.2 10*3/uL (ref 0.1–0.9)
MONO%: 5.6 % (ref 0.0–14.0)
NEUT%: 59.8 % (ref 38.4–76.8)
NEUTROS ABS: 2 10*3/uL (ref 1.5–6.5)
PLATELETS: 258 10*3/uL (ref 145–400)
RBC: 3.42 10*6/uL — ABNORMAL LOW (ref 3.70–5.45)
RDW: 20.8 % — ABNORMAL HIGH (ref 11.2–14.5)
WBC: 3.4 10*3/uL — AB (ref 3.9–10.3)

## 2017-01-03 LAB — CANCER ANTIGEN 27.29: CA 27.29: 576.1 U/mL — ABNORMAL HIGH (ref 0.0–38.6)

## 2017-01-11 ENCOUNTER — Other Ambulatory Visit: Payer: Self-pay | Admitting: *Deleted

## 2017-01-11 ENCOUNTER — Telehealth: Payer: Self-pay | Admitting: *Deleted

## 2017-01-11 ENCOUNTER — Ambulatory Visit (HOSPITAL_COMMUNITY)
Admission: RE | Admit: 2017-01-11 | Discharge: 2017-01-11 | Disposition: A | Discharge status: Home / Self Care | Payer: Medicare Other | Source: Ambulatory Visit | Attending: Adult Health | Admitting: Adult Health

## 2017-01-11 ENCOUNTER — Encounter (HOSPITAL_COMMUNITY): Payer: Self-pay

## 2017-01-11 DIAGNOSIS — I700 Atherosclerosis of aorta: Secondary | ICD-10-CM | POA: Diagnosis not present

## 2017-01-11 DIAGNOSIS — C50912 Malignant neoplasm of unspecified site of left female breast: Secondary | ICD-10-CM | POA: Diagnosis present

## 2017-01-11 DIAGNOSIS — I2510 Atherosclerotic heart disease of native coronary artery without angina pectoris: Secondary | ICD-10-CM | POA: Insufficient documentation

## 2017-01-11 DIAGNOSIS — J90 Pleural effusion, not elsewhere classified: Secondary | ICD-10-CM | POA: Diagnosis not present

## 2017-01-11 MED ORDER — IOPAMIDOL (ISOVUE-300) INJECTION 61%
75.0000 mL | Freq: Once | INTRAVENOUS | Status: AC | PRN
  Administered 2017-01-11: 60 mL via INTRAVENOUS

## 2017-01-11 MED ORDER — IOPAMIDOL (ISOVUE-300) INJECTION 61%
INTRAVENOUS | Status: AC
  Filled 2017-01-11: qty 75

## 2017-01-11 NOTE — Telephone Encounter (Signed)
This RN spoke with CT department per call with concern for administration of CT contrast per labs 01/02/2017 with creatinine 1.5 and EGFR 31.  Per MD proceed with contrast and request pt to increase fluids post scan.

## 2017-01-15 ENCOUNTER — Telehealth: Payer: Self-pay

## 2017-01-15 ENCOUNTER — Ambulatory Visit (HOSPITAL_BASED_OUTPATIENT_CLINIC_OR_DEPARTMENT_OTHER): Payer: Medicare Other | Admitting: Oncology

## 2017-01-15 ENCOUNTER — Ambulatory Visit (HOSPITAL_BASED_OUTPATIENT_CLINIC_OR_DEPARTMENT_OTHER): Payer: Medicare Other

## 2017-01-15 ENCOUNTER — Other Ambulatory Visit (HOSPITAL_BASED_OUTPATIENT_CLINIC_OR_DEPARTMENT_OTHER): Payer: Medicare Other

## 2017-01-15 VITALS — BP 183/87 | HR 67 | Temp 97.8°F | Resp 20 | Ht 63.0 in | Wt 149.9 lb

## 2017-01-15 DIAGNOSIS — C50812 Malignant neoplasm of overlapping sites of left female breast: Secondary | ICD-10-CM

## 2017-01-15 DIAGNOSIS — Z170 Estrogen receptor positive status [ER+]: Secondary | ICD-10-CM

## 2017-01-15 DIAGNOSIS — C787 Secondary malignant neoplasm of liver and intrahepatic bile duct: Secondary | ICD-10-CM

## 2017-01-15 DIAGNOSIS — C50919 Malignant neoplasm of unspecified site of unspecified female breast: Secondary | ICD-10-CM

## 2017-01-15 DIAGNOSIS — R110 Nausea: Secondary | ICD-10-CM | POA: Diagnosis not present

## 2017-01-15 DIAGNOSIS — Z5111 Encounter for antineoplastic chemotherapy: Secondary | ICD-10-CM

## 2017-01-15 DIAGNOSIS — C7802 Secondary malignant neoplasm of left lung: Secondary | ICD-10-CM

## 2017-01-15 DIAGNOSIS — M899 Disorder of bone, unspecified: Secondary | ICD-10-CM

## 2017-01-15 DIAGNOSIS — C50912 Malignant neoplasm of unspecified site of left female breast: Secondary | ICD-10-CM

## 2017-01-15 DIAGNOSIS — C50922 Malignant neoplasm of unspecified site of left male breast: Secondary | ICD-10-CM

## 2017-01-15 DIAGNOSIS — J410 Simple chronic bronchitis: Secondary | ICD-10-CM

## 2017-01-15 LAB — CBC WITH DIFFERENTIAL/PLATELET
BASO%: 2.8 % — ABNORMAL HIGH (ref 0.0–2.0)
BASOS ABS: 0.1 10*3/uL (ref 0.0–0.1)
EOS ABS: 0.1 10*3/uL (ref 0.0–0.5)
EOS%: 1.5 % (ref 0.0–7.0)
HEMATOCRIT: 30.7 % — AB (ref 34.8–46.6)
HEMOGLOBIN: 10.7 g/dL — AB (ref 11.6–15.9)
LYMPH%: 40.7 % (ref 14.0–49.7)
MCH: 34.2 pg — AB (ref 25.1–34.0)
MCHC: 34.8 g/dL (ref 31.5–36.0)
MCV: 98.5 fL (ref 79.5–101.0)
MONO#: 0.4 10*3/uL (ref 0.1–0.9)
MONO%: 13.1 % (ref 0.0–14.0)
NEUT#: 1.4 10*3/uL — ABNORMAL LOW (ref 1.5–6.5)
NEUT%: 41.9 % (ref 38.4–76.8)
Platelets: 160 10*3/uL (ref 145–400)
RBC: 3.12 10*6/uL — ABNORMAL LOW (ref 3.70–5.45)
RDW: 22.3 % — AB (ref 11.2–14.5)
WBC: 3.3 10*3/uL — ABNORMAL LOW (ref 3.9–10.3)
lymph#: 1.4 10*3/uL (ref 0.9–3.3)

## 2017-01-15 LAB — COMPREHENSIVE METABOLIC PANEL
ALBUMIN: 4 g/dL (ref 3.5–5.0)
ALK PHOS: 42 U/L (ref 40–150)
ALT: 15 U/L (ref 0–55)
AST: 19 U/L (ref 5–34)
Anion Gap: 7 mEq/L (ref 3–11)
BUN: 20.6 mg/dL (ref 7.0–26.0)
CALCIUM: 9.7 mg/dL (ref 8.4–10.4)
CO2: 27 mEq/L (ref 22–29)
Chloride: 97 mEq/L — ABNORMAL LOW (ref 98–109)
Creatinine: 1.1 mg/dL (ref 0.6–1.1)
EGFR: 42 mL/min/{1.73_m2} — ABNORMAL LOW (ref >90)
Glucose: 87 mg/dl (ref 70–140)
SODIUM: 131 meq/L — AB (ref 136–145)
Total Bilirubin: 1.32 mg/dL — ABNORMAL HIGH (ref 0.20–1.20)
Total Protein: 7 g/dL (ref 6.4–8.3)

## 2017-01-15 MED ORDER — PALBOCICLIB 100 MG PO CAPS: 100.0000 mg | ORAL_CAPSULE | Freq: Every day | ORAL | 6 refills | Status: DC

## 2017-01-15 MED ORDER — DENOSUMAB 120 MG/1.7ML ~~LOC~~ SOLN
120.0000 mg | Freq: Once | SUBCUTANEOUS | Status: AC
  Administered 2017-01-15: 120 mg via SUBCUTANEOUS
  Filled 2017-01-15: qty 1.7

## 2017-01-15 MED ORDER — FULVESTRANT 250 MG/5ML IM SOLN
500.0000 mg | Freq: Once | INTRAMUSCULAR | Status: AC
Start: 2017-01-15 — End: 2017-01-15
  Administered 2017-01-15: 500 mg via INTRAMUSCULAR
  Filled 2017-01-15: qty 10

## 2017-01-15 NOTE — Progress Notes (Signed)
ID: Theresa Wallace   DOB: 11/17/1923  MR#: [id number]  QQV#:[id number]  PCP: Theresa Huddle, MD GYN: SU:  OTHER MD:  CHIEF COMPLAINT:  Metastatic Breast Cancer  CURRENT TREATMENT: Exemestane, fulvestrant, palbociclib, Delton See   INTERVAL HISTORY: Theresa Wallace returns today for follow-up and treatment of her estrogen receptor positive stage IV breast cancer accompanied by HER-2 daughters (daughter Theresa Wallace is visiting from [place]). Theresa Wallace continues on exemestane, with good tolerance.Hot flashes and vaginal dryness are not a major issue. She never developed the arthralgias or myalgias that many patients can experience on this medication. She obtains it at a good price.  She is also on palbociclib. This makes her slightly nauseated and somewhat tired. However she does not feel she needs anti-emetics. She just lies down for about 20 minutes after taking the pill and then what it's okay". She has never vomited. This has not affected her appetite and she is actually gained a little weight.  She also receives denosumab and fulvestrant every 28 days. Aside from the discomfort of the injections she has no side effects from this. She does have some concerns from a financial point of view as she has been getting some unusually high bills for these medications.  Since the last visit here we restaged Theresa Wallace with a chest CT scan. This shows stable disease. This there is some evidence of shrinkage in the measurable disease in the liver.   REVIEW OF SYSTEMS: Aside from those problems just mentioned, Theresa Wallace is doing well. She takes a little nap in the afternoon. She visits her "boyfriend" Theresa Wallace in his nursing home where he continues to decline, she tells me, because of Alzheimer's. A detailed review of systems today was otherwise stable   HISTORY OF PRESENT ILLNESS: From the prior summary:  Theresa Wallace is an 83 y.o.  [name] I saw briefly when she moved to this area in 2007.  She had had a left breast cancer  removed at [facility] in [place] in [phone] ([place]).  It was grade 2, measured 1 cm maximally and was strongly estrogen and progesterone-receptor positive.  HER-2 was not checked.  This was T1b NX invasive ductal carcinoma, stage I, and she was treated after radiation with anastrozole for several years, eventually switching to Evista primarily for cost reasons.   More recently, she presented to Theresa Wallace with fatigue and a dry cough.  He auscultated dullness to percussion at the left base and obtained a chest x-ray which confirmed the presence of a left pleural effusion.   Theresa Wallace scheduled Theresa Wallace for an ultrasound-guided left thoracentesis, and this was performed November 09, 2010.  Approximately 1 L of amber-colored fluid was removed.  Cytology from this procedure (Theresa Wallace) showed only reactive mesothelial cells.   The patient was then referred here and set up for a CT of the chest and a PET scan.  The CT of the chest on June 20th showed 2 slightly enlarged left axillary lymph nodes with some irregular contours but more importantly multiple pleural nodules in the left chest measuring up to 4.6 cm.  The right chest was normal.  There was no pericardial effusion.  There was only a small to moderate left pleural effusion remaining, and aside from left lower lobe collapse or consolidation, there was no evidence of lung parenchymal involvement.  There was no evidence of bone involvement and also no calcified pleural plaques and no worrisome bone involvement.  PET scan on June 27th showed the nodularity along the  left pleura to be intensely hypermetabolic.  For example the large lobe nodule measuring 4.5 cm had an SUV max of 12.9.  There was no hypermetabolic mediastinal nodal activity.  The to left axillary lymph nodes were very mildly hypermetabolic.  The left breast was clear.  There was 1 hypermetabolic lesion in the liver measuring 3.2 cm.  On June 27th, Theresa Wallace performed  fine-needle aspiration of a 7 mm abnormal-appearing lymph node in the lower left axilla.  The cytology from this procedure, however, (Theresa Wallace) also was inconclusive showing no evidence of nodal tissue, mostly blood and fatty tissue.   Her subsequent history is as detailed below   PAST MEDICAL HISTORY: Past Medical History:  Diagnosis Date  . Breast cancer metastasized to multiple sites ([place]) 06/07/2011  . Cancer ([place])    breast ca  . Hypertension   1. Hypercholesterolemia. 2. Benign tremor. 3. Sigmoid diverticulosis. 4. Onychomycosis. 5. Irritable bowel syndrome. 6. GERD. 7. History of palpitations with Holter monitor in 2008 showing PACs. 8. History of hysterectomy with no salpingo-oophorectomy. 9. History of cholecystectomy. 10. History of bilateral knee arthroscopic surgery. History of hemorrhoid surgery.  FAMILY HISTORY [name] died in an automobile accident.  [name] died with "bone cancer."  The patient had 3 sisters.  One had breast cancer develop in her 78s.  There is also a cousin with breast cancer but no history of ovarian cancer or other history of cancer in first-degree relatives.  GYNECOLOGIC HISTORY: She is GX, P4, first pregnancy to term at age 17.  She was on the hormone replacement after her hysterectomy but only briefly and stopped when she had the initial diagnosis of breast cancer in 1996.  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  Her significant other Theresa Wallace who is a retired Chief Financial Officer is now suffering from dementia and is in a skilled nursing facility. The patient lives with her daughter Theresa Wallace who used to be a Network engineer for Theresa Wallace, and Theresa Wallace's husband, Theresa Wallace, who is a physician's assistant with Theresa Wallace.  The patient has 12 grandchildren and 5 great-grandchildren.  Incidentally Theresa Wallace has a son in [place].   ADVANCED DIRECTIVES: In place  HEALTH MAINTENANCE: (Updated 02/26/2013) Social History  Substance Use Topics  . Smoking status: Former Smoker    Quit date: 05/22/1968  . Smokeless tobacco: Never Used  . Alcohol use Yes     Colonoscopy:  PAP: s/p remote hysterectomy  Bone density:  10/23/2011, normal  Lipid panel: Theresa Wallace, UTD   Allergies  Allergen Reactions  . Ciprofloxacin   . Epinephrine   . Tamoxifen   . Mupirocin Rash    Current Outpatient Prescriptions  Medication Sig Dispense Refill  . aspirin 81 MG tablet Take 81 mg by mouth daily.    . Calcium Carbonate-Vitamin D (CALCIUM-VITAMIN D) 500-200 MG-UNIT per tablet Take 1 tablet by mouth 2 (two) times daily with a meal.    . cholecalciferol (VITAMIN D) 400 UNITS TABS Take 2,000 Units by mouth daily.    Marland Kitchen exemestane (AROMASIN) 25 MG tablet Take 1 tablet (25 mg total) by mouth daily. 90 tablet 3  . Glucosamine HCl-MSM (GLUCOSAMINE-MSM PO) Take 1 tablet by mouth daily.    Marland Kitchen levothyroxine (SYNTHROID, LEVOTHROID) 25 MCG tablet Take 25 mcg by mouth daily.    Marland Kitchen losartan (COZAAR) 50 MG tablet     .  metroNIDAZOLE (METROGEL) 0.75 % gel Apply topically 2 (two) times daily. For rosacea    . Multiple Vitamin (MULTIVITAMIN) tablet Take 1 tablet by mouth daily.    . palbociclib (IBRANCE) 125 MG capsule Take 1 capsule (125 mg total) by mouth daily with breakfast. Take whole with food. 21 capsule 6  . propranolol ER (INDERAL LA) 60 MG 24 hr capsule      No current facility-administered medications for this visit.     OBJECTIVE: Older white woman who appears stated age  83:   01/15/17 1449  BP: (!) 183/87  Pulse: 67  Resp: 20  Temp: 97.8 F (36.6 C)  SpO2: 99%   Body mass index is 26.55 kg/m.  Filed Weights   01/15/17 1449  Weight: 149 lb 14.4 oz (68 kg)   Sclerae unicteric, pupils round and equal Oropharynx clear and moist No cervical or supraclavicular  adenopathy Lungs no rales or rhonchi Heart regular rate and rhythm Abd soft, nontender, positive bowel sounds MSK kyphosis but no focal spinal tenderness, no upper extremity lymphedema Neuro: nonfocal, well oriented, appropriate affect Breasts: The right breast is benign. The left breast is status post lumpectomy. There is the expected disruption of the breast contour but no other sign of active disease. Both axillae are benign.      LAB RESULTS: Lab Results  Component Value Date   WBC 3.3 (L) 01/15/2017   NEUTROABS 1.4 (L) 01/15/2017   HGB 10.7 (L) 01/15/2017   HCT 30.7 (L) 01/15/2017   MCV 98.5 01/15/2017   PLT 160 01/15/2017      Chemistry      Component Value Date/Time   NA 129 (L) 01/02/2017 1332   K 5.0 01/02/2017 1332   CL 104 10/24/2012 1014   CO2 25 01/02/2017 1332   BUN 28.1 (H) 01/02/2017 1332   CREATININE 1.5 (H) 01/02/2017 1332      Component Value Date/Time   CALCIUM 9.4 01/02/2017 1332   ALKPHOS 53 01/02/2017 1332   AST 19 01/02/2017 1332   ALT 15 01/02/2017 1332   BILITOT 1.33 (H) 01/02/2017 1332      IMAGING STUDIES: Ct Chest W Contrast  Result Date: 01/11/2017 CLINICAL DATA:  Breast cancer restaging. EXAM: CT CHEST WITH CONTRAST TECHNIQUE: Multidetector CT imaging of the chest was performed during intravenous contrast administration. CONTRAST:  20m ISOVUE-300 IOPAMIDOL (ISOVUE-300) INJECTION 61% COMPARISON:  Sep 20, 2016 FINDINGS: Cardiovascular: Atherosclerotic changes are seen in the thoracic aorta without aneurysm or dissection. Central pulmonary arteries are unremarkable. The heart is unchanged. Coronary artery calcifications are noted. Mediastinum/Nodes: The representative node in the left axilla on series 2, image 52 measures 18 mm today versus 19 mm previously, similar in the interval. The small difference in size is likely technical. Small nodules in the right thyroid lobe are stable. The thyroid is otherwise unchanged. The esophagus is normal.  The left-sided pleural effusion has almost completely resolved with minimal effusion remaining. The breasts are stable in appearance. No other abnormalities identified. Lungs/Pleura: Scattered pulmonary nodule are stable. The representative nodule posteriorly on the right on image 64 continues to measure 4 mm. The largest nodule in the medial right lung base on image 99 measures 8 mm, unchanged. Extensive nodular pleural thickening on the left is stable. The small left effusion has decreased in size. Ularity Upper Abdomen: Hepatic metastases are again identified. The representative mass in the right hepatic lobe on series 2, image 130 measures 7.4 by 4.5 cm today versus 8.3 x 5.6 cm  previously. No new metastatic disease is identified. No other metastatic disease identified within the abdomen. Musculoskeletal: The mixed lesion anteriorly in T12 is stable. No new bony metastatic disease. Anterior wedging of T11 and L1 is stable. IMPRESSION: 1. The metastatic disease to the left axilla is stable. Pulmonary nodules are stable. The T12 lesion is more sclerotic but not larger, likely post therapeutic in nature. The liver lesion in the right hepatic lobe measures smaller in the interval. 2. Atherosclerotic change in the thoracic aorta and coronary artery calcifications. 3. Smaller left pleural effusion. Aortic Atherosclerosis (ICD10-I70.0). Electronically Signed   By: Dorise Bullion III M.D   On: 01/11/2017 16:21     ASSESSMENT: (1) Status post left lumpectomy in 1996 for a T1b N0 grade 2 invasive ductal carcinoma which was strongly estrogen and progesterone receptor positive, treated with radiation, then anastrozole, then Evista, all treatment discontinued July 2012  METASTATIC DISEASE: July 2012 (2) recurrence to the lining of the left lung and liver documented by liver biopsy July 2012, showing metastatic adenocarcinoma estrogen and progesterone receptor positive at 96% and 97% respectively; there was no Her-2  amplification.   (3)  She has been on exemestane since early July of 2012, with chest CT scan April 2013 showing complete resolution of her lung and liver metastases  (a) bone density in June of 08/09/2011 was normal  (b) bone density 03/20/2016 was normal with a T score of -0.6  (c) exemestane continued at the time of disease progression noted May 2018  (4) MRI of the lumbar spine 09-2012 showed significant degenerative disease but no evidence of metastatic disease to bone.  (5) disease progression noted May 2018: CT scan shows liver, lung, and bone lesions  (6) fulvestrant started 09/26/2016, palbociclib added 10/24/2016 at 125 mg daily, 21 days on. 7 days off  (7) denosumab/Xgeva started 11/21/2016, repeated every 28 days  PLAN:  I spent approximately 30 minutes with [place] and her daughters with most of that time spent discussing her treatment and its results.  Loisann is tolerating her current therapy well. The one change we are going to make is her palbociclib. Her ANC today is 1.4, just below what I would like, so within next cycle, and month from now, she will start at 100 mg daily instead of 125.  A second changes aware going to start checking her lab work on a monthly basis only from now on.  She is being billed approximately $1300 a month for the denosumab and fulvestrant. I suggested they meet with our billing people to find out if everything has been entered correctly and if there is any source of funding that might be of help.  Otherwise the plan is to continue the current treatment. She will see my advanced practice provider in a month; she will see me again in 2 months. We will continue to tag team her in that fashion through the rest of this year  Before her visit with me in December I will do a PET scan. That should help [place] uncover any occult areas of disease and better understand her response in the liver  They know to call for any other problems that may develop before the  next visit.     Tenia Goh C    01/15/2017

## 2017-01-15 NOTE — Telephone Encounter (Signed)
appts made and avs printed for patient 

## 2017-01-16 ENCOUNTER — Ambulatory Visit: Payer: Medicare Other | Admitting: Oncology

## 2017-01-16 ENCOUNTER — Other Ambulatory Visit: Payer: Medicare Other

## 2017-01-16 LAB — CANCER ANTIGEN 27.29: CA 27.29: 434.1 U/mL — ABNORMAL HIGH (ref 0.0–38.6)

## 2017-02-05 ENCOUNTER — Encounter: Payer: Self-pay | Admitting: Oncology

## 2017-02-12 ENCOUNTER — Other Ambulatory Visit (HOSPITAL_BASED_OUTPATIENT_CLINIC_OR_DEPARTMENT_OTHER): Payer: Medicare Other

## 2017-02-12 ENCOUNTER — Ambulatory Visit (HOSPITAL_BASED_OUTPATIENT_CLINIC_OR_DEPARTMENT_OTHER): Payer: Medicare Other | Admitting: Adult Health

## 2017-02-12 ENCOUNTER — Ambulatory Visit (HOSPITAL_BASED_OUTPATIENT_CLINIC_OR_DEPARTMENT_OTHER): Payer: Medicare Other

## 2017-02-12 ENCOUNTER — Encounter: Payer: Self-pay | Admitting: Adult Health

## 2017-02-12 ENCOUNTER — Telehealth: Payer: Self-pay | Admitting: Adult Health

## 2017-02-12 VITALS — BP 146/63 | HR 67 | Temp 97.9°F | Resp 18 | Ht 63.0 in | Wt 150.3 lb

## 2017-02-12 DIAGNOSIS — D72819 Decreased white blood cell count, unspecified: Secondary | ICD-10-CM

## 2017-02-12 DIAGNOSIS — Z5111 Encounter for antineoplastic chemotherapy: Secondary | ICD-10-CM

## 2017-02-12 DIAGNOSIS — M899 Disorder of bone, unspecified: Secondary | ICD-10-CM

## 2017-02-12 DIAGNOSIS — C50922 Malignant neoplasm of unspecified site of left male breast: Secondary | ICD-10-CM

## 2017-02-12 DIAGNOSIS — C787 Secondary malignant neoplasm of liver and intrahepatic bile duct: Secondary | ICD-10-CM

## 2017-02-12 DIAGNOSIS — C50912 Malignant neoplasm of unspecified site of left female breast: Secondary | ICD-10-CM

## 2017-02-12 DIAGNOSIS — C7802 Secondary malignant neoplasm of left lung: Secondary | ICD-10-CM | POA: Diagnosis not present

## 2017-02-12 DIAGNOSIS — Z170 Estrogen receptor positive status [ER+]: Principal | ICD-10-CM

## 2017-02-12 DIAGNOSIS — J410 Simple chronic bronchitis: Secondary | ICD-10-CM

## 2017-02-12 DIAGNOSIS — C50919 Malignant neoplasm of unspecified site of unspecified female breast: Secondary | ICD-10-CM

## 2017-02-12 DIAGNOSIS — C50812 Malignant neoplasm of overlapping sites of left female breast: Secondary | ICD-10-CM

## 2017-02-12 LAB — CBC WITH DIFFERENTIAL/PLATELET
BASO%: 2.4 % — ABNORMAL HIGH (ref 0.0–2.0)
Basophils Absolute: 0.1 10*3/uL (ref 0.0–0.1)
EOS ABS: 0.1 10*3/uL (ref 0.0–0.5)
EOS%: 2.1 % (ref 0.0–7.0)
HCT: 30 % — ABNORMAL LOW (ref 34.8–46.6)
HGB: 10.4 g/dL — ABNORMAL LOW (ref 11.6–15.9)
LYMPH%: 39.7 % (ref 14.0–49.7)
MCH: 35.6 pg — ABNORMAL HIGH (ref 25.1–34.0)
MCHC: 34.7 g/dL (ref 31.5–36.0)
MCV: 102.7 fL — AB (ref 79.5–101.0)
MONO#: 0.3 10*3/uL (ref 0.1–0.9)
MONO%: 10.5 % (ref 0.0–14.0)
NEUT%: 45.3 % (ref 38.4–76.8)
NEUTROS ABS: 1.3 10*3/uL — AB (ref 1.5–6.5)
Platelets: 131 10*3/uL — ABNORMAL LOW (ref 145–400)
RBC: 2.92 10*6/uL — AB (ref 3.70–5.45)
RDW: 15.4 % — ABNORMAL HIGH (ref 11.2–14.5)
WBC: 2.9 10*3/uL — AB (ref 3.9–10.3)
lymph#: 1.1 10*3/uL (ref 0.9–3.3)

## 2017-02-12 LAB — COMPREHENSIVE METABOLIC PANEL
ALT: 13 U/L (ref 0–55)
AST: 17 U/L (ref 5–34)
Albumin: 3.8 g/dL (ref 3.5–5.0)
Alkaline Phosphatase: 40 U/L (ref 40–150)
Anion Gap: 6 mEq/L (ref 3–11)
BILIRUBIN TOTAL: 0.91 mg/dL (ref 0.20–1.20)
BUN: 29.4 mg/dL — ABNORMAL HIGH (ref 7.0–26.0)
CO2: 26 meq/L (ref 22–29)
Calcium: 9 mg/dL (ref 8.4–10.4)
Chloride: 102 mEq/L (ref 98–109)
Creatinine: 1.4 mg/dL — ABNORMAL HIGH (ref 0.6–1.1)
EGFR: 34 mL/min/{1.73_m2} — AB (ref >90)
GLUCOSE: 87 mg/dL (ref 70–140)
POTASSIUM: 5.1 meq/L (ref 3.5–5.1)
SODIUM: 133 meq/L — AB (ref 136–145)
TOTAL PROTEIN: 6.7 g/dL (ref 6.4–8.3)

## 2017-02-12 MED ORDER — DENOSUMAB 120 MG/1.7ML ~~LOC~~ SOLN
120.0000 mg | Freq: Once | SUBCUTANEOUS | Status: AC
  Administered 2017-02-12: 120 mg via SUBCUTANEOUS
  Filled 2017-02-12: qty 1.7

## 2017-02-12 MED ORDER — FULVESTRANT 250 MG/5ML IM SOLN
500.0000 mg | Freq: Once | INTRAMUSCULAR | Status: AC
  Administered 2017-02-12: 500 mg via INTRAMUSCULAR
  Filled 2017-02-12: qty 10

## 2017-02-12 NOTE — Telephone Encounter (Signed)
Scheduled appts per 9/24 los. Did not want avs or calendar.

## 2017-02-12 NOTE — Patient Instructions (Signed)
Fulvestrant injection What is this medicine? FULVESTRANT (ful VES trant) blocks the effects of estrogen. It is used to treat breast cancer. This medicine may be used for other purposes; ask your health care provider or pharmacist if you have questions. COMMON BRAND NAME(S): FASLODEX What should I tell my health care provider before I take this medicine? They need to know if you have any of these conditions: -bleeding problems -liver disease -low levels of platelets in the blood -an unusual or allergic reaction to fulvestrant, other medicines, foods, dyes, or preservatives -pregnant or trying to get pregnant -breast-feeding How should I use this medicine? This medicine is for injection into a muscle. It is usually given by a health care professional in a hospital or clinic setting. Talk to your pediatrician regarding the use of this medicine in children. Special care may be needed. Overdosage: If you think you have taken too much of this medicine contact a poison control center or emergency room at once. NOTE: This medicine is only for you. Do not share this medicine with others. What if I miss a dose? It is important not to miss your dose. Call your doctor or health care professional if you are unable to keep an appointment. What may interact with this medicine? -medicines that treat or prevent blood clots like warfarin, enoxaparin, and dalteparin This list may not describe all possible interactions. Give your health care provider a list of all the medicines, herbs, non-prescription drugs, or dietary supplements you use. Also tell them if you smoke, drink alcohol, or use illegal drugs. Some items may interact with your medicine. What should I watch for while using this medicine? Your condition will be monitored carefully while you are receiving this medicine. You will need important blood work done while you are taking this medicine. Do not become pregnant while taking this medicine or for  at least 1 year after stopping it. Women of child-bearing potential will need to have a negative pregnancy test before starting this medicine. Women should inform their doctor if they wish to become pregnant or think they might be pregnant. There is a potential for serious side effects to an unborn child. Men should inform their doctors if they wish to father a child. This medicine may lower sperm counts. Talk to your health care professional or pharmacist for more information. Do not breast-feed an infant while taking this medicine or for 1 year after the last dose. What side effects may I notice from receiving this medicine? Side effects that you should report to your doctor or health care professional as soon as possible: -allergic reactions like skin rash, itching or hives, swelling of the face, lips, or tongue -feeling faint or lightheaded, falls -pain, tingling, numbness, or weakness in the legs -signs and symptoms of infection like fever or chills; cough; flu-like symptoms; sore throat -vaginal bleeding Side effects that usually do not require medical attention (report to your doctor or health care professional if they continue or are bothersome): -aches, pains -constipation -diarrhea -headache -hot flashes -nausea, vomiting -pain at site where injected -stomach pain This list may not describe all possible side effects. Call your doctor for medical advice about side effects. You may report side effects to FDA at 1-800-FDA-1088. Where should I keep my medicine? This drug is given in a hospital or clinic and will not be stored at home. NOTE: This sheet is a summary. It may not cover all possible information. If you have questions about this medicine, talk to your   doctor, pharmacist, or health care provider.  2018 Elsevier/Gold Standard (2014-12-04 11:03:55) Denosumab injection What is this medicine? DENOSUMAB (den oh sue mab) slows bone breakdown. Prolia is used to treat osteoporosis in  women after menopause and in men. Delton See is used to treat a high calcium level due to cancer and to prevent bone fractures and other bone problems caused by multiple myeloma or cancer bone metastases. Delton See is also used to treat giant cell tumor of the bone. This medicine may be used for other purposes; ask your health care provider or pharmacist if you have questions. COMMON BRAND NAME(S): Prolia, XGEVA What should I tell my health care provider before I take this medicine? They need to know if you have any of these conditions: -dental disease -having surgery or tooth extraction -infection -kidney disease -low levels of calcium or Vitamin D in the blood -malnutrition -on hemodialysis -skin conditions or sensitivity -thyroid or parathyroid disease -an unusual reaction to denosumab, other medicines, foods, dyes, or preservatives -pregnant or trying to get pregnant -breast-feeding How should I use this medicine? This medicine is for injection under the skin. It is given by a health care professional in a hospital or clinic setting. If you are getting Prolia, a special MedGuide will be given to you by the pharmacist with each prescription and refill. Be sure to read this information carefully each time. For Prolia, talk to your pediatrician regarding the use of this medicine in children. Special care may be needed. For Delton See, talk to your pediatrician regarding the use of this medicine in children. While this drug may be prescribed for children as young as 13 years for selected conditions, precautions do apply. Overdosage: If you think you have taken too much of this medicine contact a poison control center or emergency room at once. NOTE: This medicine is only for you. Do not share this medicine with others. What if I miss a dose? It is important not to miss your dose. Call your doctor or health care professional if you are unable to keep an appointment. What may interact with this  medicine? Do not take this medicine with any of the following medications: -other medicines containing denosumab This medicine may also interact with the following medications: -medicines that lower your chance of fighting infection -steroid medicines like prednisone or cortisone This list may not describe all possible interactions. Give your health care provider a list of all the medicines, herbs, non-prescription drugs, or dietary supplements you use. Also tell them if you smoke, drink alcohol, or use illegal drugs. Some items may interact with your medicine. What should I watch for while using this medicine? Visit your doctor or health care professional for regular checks on your progress. Your doctor or health care professional may order blood tests and other tests to see how you are doing. Call your doctor or health care professional for advice if you get a fever, chills or sore throat, or other symptoms of a cold or flu. Do not treat yourself. This drug may decrease your body's ability to fight infection. Try to avoid being around people who are sick. You should make sure you get enough calcium and vitamin D while you are taking this medicine, unless your doctor tells you not to. Discuss the foods you eat and the vitamins you take with your health care professional. See your dentist regularly. Brush and floss your teeth as directed. Before you have any dental work done, tell your dentist you are receiving this medicine. Do  not become pregnant while taking this medicine or for 5 months after stopping it. Talk with your doctor or health care professional about your birth control options while taking this medicine. Women should inform their doctor if they wish to become pregnant or think they might be pregnant. There is a potential for serious side effects to an unborn child. Talk to your health care professional or pharmacist for more information. What side effects may I notice from receiving this  medicine? Side effects that you should report to your doctor or health care professional as soon as possible: -allergic reactions like skin rash, itching or hives, swelling of the face, lips, or tongue -bone pain -breathing problems -dizziness -jaw pain, especially after dental work -redness, blistering, peeling of the skin -signs and symptoms of infection like fever or chills; cough; sore throat; pain or trouble passing urine -signs of low calcium like fast heartbeat, muscle cramps or muscle pain; pain, tingling, numbness in the hands or feet; seizures -unusual bleeding or bruising -unusually weak or tired Side effects that usually do not require medical attention (report to your doctor or health care professional if they continue or are bothersome): -constipation -diarrhea -headache -joint pain -loss of appetite -muscle pain -runny nose -tiredness -upset stomach This list may not describe all possible side effects. Call your doctor for medical advice about side effects. You may report side effects to FDA at 1-800-FDA-1088. Where should I keep my medicine? This medicine is only given in a clinic, doctor's office, or other health care setting and will not be stored at home. NOTE: This sheet is a summary. It may not cover all possible information. If you have questions about this medicine, talk to your doctor, pharmacist, or health care provider.  2018 Elsevier/Gold Standard (2016-05-30 19:17:21)  

## 2017-02-12 NOTE — Progress Notes (Signed)
ID: [name]   DOB: [birth year]  MR#: [id number]  GQB#:[id number]  PCP: [name], MD GYN: SU:  OTHER MD:  CHIEF COMPLAINT:  Metastatic Breast Cancer  CURRENT TREATMENT: Exemestane, fulvestrant, palbociclib, Delton See   INTERVAL HISTORY: Theresa Wallace is here today for follow up of her metastatic breast cancer.  She is doing moderately well today.  She continues to tolerate Exemestane, Fulvestrant, and Xgeva well.  She has been on Palbociclib 177m daily for, however recently her WBC was slightly low and Dr. MJana Hakimlowered the dose to 1080mper day.  Since that change however, the pharmacy has not received the medication.    REVIEW OF SYSTEMS: ReMeryns doing well.  She recently stopped taking Glucosamine, and she cannot really tell a difference, so she isn't sure if she is going to take it or not.  She denies any other issues such as fevers, chills, fatigue, nausea, vomiting, headaches, or any other issues.  A detailed ROS is otherwise non contributory.     HISTORY OF PRESENT ILLNESS: From the prior summary:  Theresa Wallace an 83.0.  [place] woman I saw briefly when she moved to this area in 2007.  She [facility] [place] [id number]  It was grade 2, measured 1 cm maximally and was strongly estrogen and progesterone-receptor positive.  HER-2 was not checked.  This was T1b NX invasive ductal carcinoma, stage I, and she was treated after radiation with anastrozole for several years, eventually switching to Evista primarily for cost reasons.   More recently, she presented to Dr. GaInda Merlinith fatigue and a dry cough.  He auscultated dullness to percussion at the left base and obtained a chest x-ray which confirmed the presence of a left pleural effusion.   Dr. GaInda Merlincheduled ReJoseph Artor an ultrasound-guided left thoracentesis, and this was performed November 09, 2010.  Approximately 1 L of amber-colored fluid was removed.   Cytology from this procedure ([id number] only reactive mesothelial cells.   The patient was then referred here and set up for a CT of the chest and a PET scan.  The CT of the chest on June 20th showed 2 slightly enlarged left axillary lymph nodes with some irregular contours but more importantly multiple pleural nodules in the left chest measuring up to 4.6 cm.  The right chest was normal.  There was no pericardial effusion.  There was only a small to moderate left pleural effusion remaining, and aside from left lower lobe collapse or consolidation, there was no evidence of lung parenchymal involvement.  There was no evidence of bone involvement and also no calcified pleural plaques and no worrisome bone involvement.  PET scan on June 27th showed the nodularity along the left pleura to be intensely hypermetabolic.  For example the large lobe nodule measuring 4.5 cm had an SUV max of 12.9.  There was no hypermetabolic mediastinal nodal activity.  The to left axillary lymph nodes were very mildly hypermetabolic.  The left breast was clear.  There was 1 hypermetabolic lesion in the liver measuring 3.2 cm.  On June 27th, Dr. BeIsaiah Blakeserformed fine-needle aspiration of a 7 mm abnormal-appearing lymph node in the lower left axilla.  The cytology from this procedure, however, ([name]) also was inconclusive showing no evidence of nodal tissue, mostly blood and fatty tissue.   Her subsequent history is as detailed below   PAST MEDICAL HISTORY: Past Medical History:  Diagnosis  Date  . Breast cancer metastasized to multiple sites ([place]) 06/07/2011  . Cancer ([place])    breast ca  . Hypertension   1. Hypercholesterolemia. 2. Benign tremor. 3. Sigmoid diverticulosis. 4. Onychomycosis. 5. Irritable bowel syndrome. 6. GERD. 7. History of palpitations with Holter monitor in 2008 showing PACs. 8. History of hysterectomy with no salpingo-oophorectomy. 9. History of cholecystectomy. 10. History of  bilateral knee arthroscopic surgery. History of hemorrhoid surgery.  FAMILY HISTORY [name] died in an automobile accident.  [name] died with "bone cancer."  The patient had 3 sisters.  One had breast cancer develop in her 55s.  There is also a cousin with breast cancer but no history of ovarian cancer or other history of cancer in first-degree relatives.  GYNECOLOGIC HISTORY: She is GX, P4, first pregnancy to term at age 83.  She was on the hormone replacement after her hysterectomy but only briefly and stopped when she had the initial diagnosis of breast cancer in 1996.  SOCIAL HISTORY: (Updated May 2018) She worked as a Network engineer.  She has been widowed since 2005, her husband [name] dying after a long illness involving strokes and a myocardial infarction.  Her significant other [name] who is a retired Chief Financial Officer is now suffering from dementia and is in a skilled nursing facility. The patient lives with her daughter [name] who used to be a Network engineer for Dr. Inda Merlin, and [name]'s husband, [name], who is a physician's assistant with Dr. Durward Fortes.  The patient has 12 grandchildren and 5 great-grandchildren. Incidentally [name] has a son in [place].   ADVANCED DIRECTIVES: In place  HEALTH MAINTENANCE: (Updated 02/26/2013) Social History  Substance Use Topics  . Smoking status: Former Smoker    Quit date: 05/22/1968  . Smokeless tobacco: Never Used  . Alcohol use Yes     Colonoscopy:  PAP: s/p remote hysterectomy  Bone density:  10/23/2011, normal  Lipid panel: Dr. Inda Merlin, UTD   Allergies  Allergen Reactions  . Ciprofloxacin   . Epinephrine   . Tamoxifen   . Mupirocin Rash    Current Outpatient Prescriptions  Medication Sig Dispense Refill  . aspirin 81 MG tablet Take 81 mg by mouth daily.    . Calcium Carbonate-Vitamin D (CALCIUM-VITAMIN D) 500-200 MG-UNIT per tablet Take 1 tablet by mouth 2 (two) times daily with a meal.    .  cholecalciferol (VITAMIN D) 400 UNITS TABS Take 2,000 Units by mouth daily.    Marland Kitchen exemestane (AROMASIN) 25 MG tablet Take 1 tablet (25 mg total) by mouth daily. 90 tablet 3  . Glucosamine HCl-MSM (GLUCOSAMINE-MSM PO) Take 1 tablet by mouth daily.    Marland Kitchen levothyroxine (SYNTHROID, LEVOTHROID) 25 MCG tablet Take 25 mcg by mouth daily.    Marland Kitchen losartan (COZAAR) 50 MG tablet     . metroNIDAZOLE (METROGEL) 0.75 % gel Apply topically 2 (two) times daily. For rosacea    . Multiple Vitamin (MULTIVITAMIN) tablet Take 1 tablet by mouth daily.    . palbociclib (IBRANCE) 100 MG capsule Take 1 capsule (100 mg total) by mouth daily with breakfast. Take whole with food. 21 capsule 6  . propranolol ER (INDERAL LA) 60 MG 24 hr capsule      No current facility-administered medications for this visit.     OBJECTIVE:   Vitals:   02/12/17 1339  BP: (!) 146/63  Pulse: 67  Resp: 18  Temp: 97.9 F (36.6 C)  SpO2: 98%   Body mass index is 26.62 kg/m.  Filed Weights   02/12/17 1339  Weight: 150 lb 4.8 oz (68.2 kg)   GENERAL: Patient is a well appearing older woman in no acute distress HEENT:  Sclerae anicteric.  PERRL. Oropharynx clear and moist. No ulcerations or evidence of oropharyngeal candidiasis. Neck is supple.  NODES:  No cervical, supraclavicular, or axillary lymphadenopathy palpated.  BREAST EXAM:  Deferred. LUNGS:  Clear to auscultation bilaterally.  No wheezes or rhonchi. HEART:  Regular rate and rhythm. No murmur appreciated. ABDOMEN:  Soft, nontender.  Positive, normoactive bowel sounds. No organomegaly palpated. MSK:  No focal spinal tenderness to palpation. Full range of motion bilaterally in the upper extremities. EXTREMITIES:  No peripheral edema.   SKIN:  Clear with no obvious rashes or skin changes. No nail dyscrasia. NEURO:  Nonfocal. Well oriented.  Appropriate affect.        LAB RESULTS: Lab Results  Component Value Date   WBC 2.9 (L) 02/12/2017   NEUTROABS 1.3 (L) 02/12/2017    HGB 10.4 (L) 02/12/2017   HCT 30.0 (L) 02/12/2017   MCV 102.7 (H) 02/12/2017   PLT 131 (L) 02/12/2017      Chemistry      Component Value Date/Time   NA 131 (L) 01/15/2017 1431   K 5.2 No visable hemolysis (H) 01/15/2017 1431   CL 104 10/24/2012 1014   CO2 27 01/15/2017 1431   BUN 20.6 01/15/2017 1431   CREATININE 1.1 01/15/2017 1431      Component Value Date/Time   CALCIUM 9.7 01/15/2017 1431   ALKPHOS 42 01/15/2017 1431   AST 19 01/15/2017 1431   ALT 15 01/15/2017 1431   BILITOT 1.32 (H) 01/15/2017 1431      IMAGING STUDIES: No results found.   ASSESSMENT: (1) Status post left lumpectomy in 1996 for a T1b N0 grade 2 invasive ductal carcinoma which was strongly estrogen and progesterone receptor positive, treated with radiation, then anastrozole, then Evista, all treatment discontinued July 2012  METASTATIC DISEASE: July 2012 (2) recurrence to the lining of the left lung and liver documented by liver biopsy July 2012, showing metastatic adenocarcinoma estrogen and progesterone receptor positive at 96% and 97% respectively; there was no Her-2 amplification.   (3)  She has been on exemestane since early July of 2012, with chest CT scan April 2013 showing complete resolution of her lung and liver metastases  (a) bone density in June of 08/09/2011 was normal  (b) bone density 03/20/2016 was normal with a T score of -0.6  (c) exemestane continued at the time of disease progression noted May 2018  (4) MRI of the lumbar spine 09-2012 showed significant degenerative disease but no evidence of metastatic disease to bone.  (5) disease progression noted May 2018: CT scan shows liver, lung, and bone lesions  (6) fulvestrant started 09/26/2016, palbociclib added 10/24/2016 at 125 mg daily, 21 days on. 7 days off, decreased to 187m daily on 02/14/2017, 21 days on, 7 days off  (7) denosumab/Xgeva started 11/21/2016, repeated every 28 days  PLAN:  RSulayis doing well today.  Her  labs are stable, however her wbc is still slightly low, but sufficient enough to start.  She will start Palbocicilib 1073mdaily on Wednesday.  She will return in 2 weeks for lab only to see where her WBC are and ensure they aren't even lower.  She will proceed with Fulvestrant and Xgeva today.  She will continue taking Exemestane daily.    They know to call for any other problems that may  develop before the next visit.  A total of (30) minutes of face-to-face time was spent with this patient with greater than 50% of that time in counseling and care-coordination.      Scot Dock    02/12/2017

## 2017-02-13 ENCOUNTER — Other Ambulatory Visit: Payer: Self-pay | Admitting: Adult Health

## 2017-02-13 ENCOUNTER — Telehealth: Payer: Self-pay

## 2017-02-13 LAB — CANCER ANTIGEN 27.29: CAN 27.29: 324.2 U/mL — AB (ref 0.0–38.6)

## 2017-02-13 MED ORDER — PALBOCICLIB 100 MG PO CAPS: 100.0000 mg | ORAL_CAPSULE | Freq: Every day | ORAL | 6 refills | Status: DC

## 2017-02-13 NOTE — Telephone Encounter (Signed)
Ibrance 100 mg Rx was faxed directly to [facility] [phone]) for patient with 6 refills per Wilber Bihari NP. Fax confirmation received.

## 2017-02-26 ENCOUNTER — Other Ambulatory Visit (HOSPITAL_BASED_OUTPATIENT_CLINIC_OR_DEPARTMENT_OTHER): Payer: Medicare Other

## 2017-02-26 DIAGNOSIS — C7802 Secondary malignant neoplasm of left lung: Secondary | ICD-10-CM

## 2017-02-26 DIAGNOSIS — C50912 Malignant neoplasm of unspecified site of left female breast: Secondary | ICD-10-CM | POA: Diagnosis not present

## 2017-02-26 DIAGNOSIS — C50812 Malignant neoplasm of overlapping sites of left female breast: Secondary | ICD-10-CM

## 2017-02-26 DIAGNOSIS — C787 Secondary malignant neoplasm of liver and intrahepatic bile duct: Secondary | ICD-10-CM | POA: Diagnosis not present

## 2017-02-26 DIAGNOSIS — C50919 Malignant neoplasm of unspecified site of unspecified female breast: Secondary | ICD-10-CM

## 2017-02-26 DIAGNOSIS — J410 Simple chronic bronchitis: Secondary | ICD-10-CM

## 2017-02-26 DIAGNOSIS — Z170 Estrogen receptor positive status [ER+]: Secondary | ICD-10-CM

## 2017-02-26 LAB — CBC WITH DIFFERENTIAL/PLATELET
BASO%: 2.2 % — ABNORMAL HIGH (ref 0.0–2.0)
BASOS ABS: 0.1 10*3/uL (ref 0.0–0.1)
EOS ABS: 0.1 10*3/uL (ref 0.0–0.5)
EOS%: 3 % (ref 0.0–7.0)
HEMATOCRIT: 30.6 % — AB (ref 34.8–46.6)
HEMOGLOBIN: 10.7 g/dL — AB (ref 11.6–15.9)
LYMPH#: 1.1 10*3/uL (ref 0.9–3.3)
LYMPH%: 30.3 % (ref 14.0–49.7)
MCH: 36.8 pg — AB (ref 25.1–34.0)
MCHC: 34.9 g/dL (ref 31.5–36.0)
MCV: 105.4 fL — ABNORMAL HIGH (ref 79.5–101.0)
MONO#: 0.2 10*3/uL (ref 0.1–0.9)
MONO%: 5.8 % (ref 0.0–14.0)
NEUT#: 2.2 10*3/uL (ref 1.5–6.5)
NEUT%: 58.7 % (ref 38.4–76.8)
Platelets: 213 10*3/uL (ref 145–400)
RBC: 2.9 10*6/uL — ABNORMAL LOW (ref 3.70–5.45)
RDW: 14.4 % (ref 11.2–14.5)
WBC: 3.8 10*3/uL — ABNORMAL LOW (ref 3.9–10.3)

## 2017-02-26 LAB — COMPREHENSIVE METABOLIC PANEL
ALK PHOS: 38 U/L — AB (ref 40–150)
ALT: 12 U/L (ref 0–55)
AST: 18 U/L (ref 5–34)
Albumin: 3.9 g/dL (ref 3.5–5.0)
Anion Gap: 7 mEq/L (ref 3–11)
BUN: 25.9 mg/dL (ref 7.0–26.0)
CALCIUM: 9.5 mg/dL (ref 8.4–10.4)
CO2: 27 mEq/L (ref 22–29)
CREATININE: 1.5 mg/dL — AB (ref 0.6–1.1)
Chloride: 99 mEq/L (ref 98–109)
EGFR: 30 mL/min/{1.73_m2} — ABNORMAL LOW (ref >90)
Glucose: 113 mg/dl (ref 70–140)
POTASSIUM: 4.8 meq/L (ref 3.5–5.1)
Sodium: 134 mEq/L — ABNORMAL LOW (ref 136–145)
Total Bilirubin: 1.05 mg/dL (ref 0.20–1.20)
Total Protein: 6.6 g/dL (ref 6.4–8.3)

## 2017-02-27 LAB — CANCER ANTIGEN 27.29: CA 27.29: 287.3 U/mL — ABNORMAL HIGH (ref 0.0–38.6)

## 2017-03-11 NOTE — Progress Notes (Signed)
ID: [name]   DOB: 09/06/1923  MR#: [id number]  TRV#:[id number]  PCP: [name], MD GYN: SU:  OTHER MD:  CHIEF COMPLAINT:  Metastatic Breast Cancer  CURRENT TREATMENT: Exemestane, fulvestrant, palbociclib, Delton See   INTERVAL HISTORY: Theresa Wallace returns today for follow-up and treatment of her estrogen receptor positive metastatic breast cancer. She is accompanied by her daughter. She continues on exemestane, with good tolerance. She reports mild fatigue without hot flashes or increase in vaginal dryness. Pt daughter notes that the pt has an ongoing PRN prescription of diflucan for yeast infections.   She is also on fulvestrant, which she receives every 28 days, with a dose due today. She denies any pain with the injection or following the injection.  Finally she receives palbociclib 100 mg daily. This is her off week and she will start again on this coming Wednesday, 03/14/2017. She has increased fatigue that she takes naps for to alleviate her fatigue while on this medication.    REVIEW OF SYSTEMS: Theresa Wallace reports has had numbness in bilateral hands with her left more than right. She has transient numbness to both hands, but notes that she has prolonged numbness in her left hand. She had blackish blue discoloration to her left index finger recently that has resolved. She has increased cold sensation to her bilateral hands and has to wear gloves around the house. Pt daughter reports that with wearing the gloves around the house, the cold and numb sensation decreases. She denies unusual headaches, visual changes, nausea, vomiting, or dizziness. There has been no unusual cough, phlegm production, or pleurisy. This been no change in bowel or bladder habits. She denies unexplained fatigue or unexplained weight loss, bleeding, rash, or fever. A detailed review of systems was otherwise stable.     HISTORY OF PRESENT ILLNESS: From the prior summary:  Theresa Wallace is an 83 y.o.  [place] woman I  saw briefly when she moved to this area in 2007.  She had had a left breast cancer removed at [facility] in [place] in [phone] (Theresa Wallace).  It was grade 2, measured 1 cm maximally and was strongly estrogen and progesterone-receptor positive.  HER-2 was not checked.  This was T1b NX invasive ductal carcinoma, stage I, and she was treated after radiation with anastrozole for several years, eventually switching to Evista primarily for cost reasons.   More recently, she presented to Dr. Inda Merlin with fatigue and a dry cough.  He auscultated dullness to percussion at the left base and obtained a chest x-ray which confirmed the presence of a left pleural effusion.   Dr. Inda Merlin scheduled Theresa Wallace for an ultrasound-guided left thoracentesis, and this was performed November 09, 2010.  Approximately 1 L of amber-colored fluid was removed.  Cytology from this procedure ([id number]) showed only reactive mesothelial cells.   The patient was then referred here and set up for a CT of the chest and a PET scan.  The CT of the chest on June 20th showed 2 slightly enlarged left axillary lymph nodes with some irregular contours but more importantly multiple pleural nodules in the left chest measuring up to 4.6 cm.  The right chest was normal.  There was no pericardial effusion.  There was only a small to moderate left pleural effusion remaining, and aside from left lower lobe collapse or consolidation, there was no evidence of lung parenchymal involvement.  There was no evidence of bone involvement and also no calcified pleural plaques and no worrisome bone involvement.  PET scan on June 27th showed the nodularity along the left pleura to be intensely hypermetabolic.  For example the large lobe nodule measuring 4.5 cm had an SUV max of 12.9.  There was no hypermetabolic mediastinal nodal activity.  The to left axillary lymph nodes were very mildly hypermetabolic.  The left breast was clear.  There was 1 hypermetabolic  lesion in the liver measuring 3.2 cm.  On June 27th, Dr. [name] performed fine-needle aspiration of a 7 mm abnormal-appearing lymph node in the lower left axilla.  The cytology from this procedure, however, ([name]) also was inconclusive showing no evidence of nodal tissue, mostly blood and fatty tissue.   Her subsequent history is as detailed below   PAST MEDICAL HISTORY: Past Medical History:  Diagnosis Date  . Breast cancer metastasized to multiple sites ([place]) 06/07/2011  . Cancer ([place])    breast ca  . Hypertension   1. Hypercholesterolemia. 2. Benign tremor. 3. Sigmoid diverticulosis. 4. Onychomycosis. 5. Irritable bowel syndrome. 6. GERD. 7. History of palpitations with Holter monitor in 2008 showing PACs. 8. History of hysterectomy with no salpingo-oophorectomy. 9. History of cholecystectomy. 10. History of bilateral knee arthroscopic surgery. History of hemorrhoid surgery.  FAMILY HISTORY [name] died in an automobile accident.  [name] died with "bone cancer."  The patient had 3 sisters.  One had breast cancer develop in her 69s.  There is also a cousin with breast cancer but no history of ovarian cancer or other history of cancer in first-degree relatives.  GYNECOLOGIC HISTORY: She is GX, P4, first pregnancy to term at age 5.  She was on the hormone replacement after her hysterectomy but only briefly and stopped when she had the initial diagnosis of breast cancer in 1996.  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  Her significant other Theresa Wallace who is a retired Chief Financial Officer is now suffering from dementia and is in a skilled nursing facility. The patient lives with her daughter Theresa Wallace who used to be a Network engineer for Dr. Inda Merlin, and Theresa Wallace's husband, Theresa Wallace, who is a physician's assistant with Dr.  Durward Wallace.  The patient has 12 grandchildren and 5 great-grandchildren. Incidentally Theresa Wallace has a son in [place].   ADVANCED DIRECTIVES: In place  HEALTH MAINTENANCE: (Updated 02/26/2013) Social History  Substance Use Topics  . Smoking status: Former Smoker    Quit date: 05/22/1968  . Smokeless tobacco: Never Used  . Alcohol use Yes     Colonoscopy:  PAP: s/p remote hysterectomy  Bone density:  10/23/2011, normal  Lipid panel: Dr. Inda Merlin, UTD   Allergies  Allergen Reactions  . Ciprofloxacin   . Epinephrine   . Tamoxifen   . Mupirocin Rash    Current Outpatient Prescriptions  Medication Sig Dispense Refill  . aspirin 81 MG tablet Take 81 mg by mouth daily.    . Calcium Carbonate-Vitamin D (CALCIUM-VITAMIN D) 500-200 MG-UNIT per tablet Take 1 tablet by mouth 2 (two) times daily with a meal.    . cholecalciferol (VITAMIN D) 400 UNITS TABS Take 2,000 Units by mouth daily.    Theresa Wallace Kitchen exemestane (AROMASIN) 25 MG tablet Take 1 tablet (25 mg total) by mouth daily. 90 tablet 3  . Glucosamine HCl-MSM (GLUCOSAMINE-MSM PO) Take 1 tablet by mouth daily.    Theresa Wallace Kitchen levothyroxine (SYNTHROID, LEVOTHROID) 25 MCG tablet Take 25 mcg by mouth  daily.    . losartan (COZAAR) 50 MG tablet     . metroNIDAZOLE (METROGEL) 0.75 % gel Apply topically 2 (two) times daily. For rosacea    . Multiple Vitamin (MULTIVITAMIN) tablet Take 1 tablet by mouth daily.    . palbociclib (IBRANCE) 100 MG capsule Take 1 capsule (100 mg total) by mouth daily with breakfast. Take whole with food. 21 capsule 6  . propranolol ER (INDERAL LA) 60 MG 24 hr capsule      No current facility-administered medications for this visit.     OBJECTIVE: elderly white woman who appears stated age  107:   03/12/17 1133  BP: (!) 159/64  Pulse: 65  Resp: 18  Temp: 98.3 F (36.8 C)  SpO2: 97%   Body mass index is 27.16 kg/m.  Filed Weights   03/12/17 1133  Weight: 153 lb 4.8 oz (69.5 kg)   Sclerae unicteric, pupils round and  equal Oropharynx clear and moist No cervical or supraclavicular adenopathy Lungs no rales or rhonchi Heart regular rate and rhythm Abd soft, nontender, positive bowel sounds MSK no focal spinal tenderness, no upper extremity lymphedema Neuro: nonfocal, well oriented, appropriate affect Breasts: the right breast is benign. The left breast is status post lumpectomy. There is no evidence of breast activity, and the scar tissue is stable. Both axillae are benign.   LAB RESULTS: Lab Results  Component Value Date   WBC 2.8 (L) 03/12/2017   NEUTROABS 1.6 03/12/2017   HGB 10.9 (L) 03/12/2017   HCT 31.9 (L) 03/12/2017   MCV 105.7 (H) 03/12/2017   PLT 137 (L) 03/12/2017      Chemistry      Component Value Date/Time   NA 134 (L) 02/26/2017 1415   K 4.8 02/26/2017 1415   CL 104 10/24/2012 1014   CO2 27 02/26/2017 1415   BUN 25.9 02/26/2017 1415   CREATININE 1.5 (H) 02/26/2017 1415      Component Value Date/Time   CALCIUM 9.5 02/26/2017 1415   ALKPHOS 38 (L) 02/26/2017 1415   AST 18 02/26/2017 1415   ALT 12 02/26/2017 1415   BILITOT 1.05 02/26/2017 1415      IMAGING STUDIES: CA-27-29 results reviewed with the patient and her daughter: There is a favorable continuing trend  ASSESSMENT: (1) Status post left lumpectomy in 1996 for a T1b N0 grade 2 invasive ductal carcinoma which was strongly estrogen and progesterone receptor positive, treated with radiation, then anastrozole, then Evista, all treatment discontinued July 2012  METASTATIC DISEASE: July 2012 (2) recurrence to the lining of the left lung and liver documented by liver biopsy July 2012, showing metastatic adenocarcinoma estrogen and progesterone receptor positive at 96% and 97% respectively; there was no Her-2 amplification.   (3)  She has been on exemestane since early July of 2012, with chest CT scan April 2013 showing complete resolution of her lung and liver metastases  (a) bone density in June of 08/09/2011 was  normal  (b) bone density 03/20/2016 was normal with a T score of -0.6  (c) exemestane continued at the time of disease progression noted May 2018  (4) MRI of the lumbar spine 09-2012 showed significant degenerative disease but no evidence of metastatic disease to bone.  (5) disease progression noted May 2018: CT scan shows liver, lung, and bone lesions  (6) fulvestrant started 09/26/2016, palbociclib added 10/24/2016 at 125 mg daily, 21 days on. 7 days off, decreased to 184m daily on 02/14/2017, 21 days on, 7 days off  (7)  denosumab/Xgeva started 11/21/2016, repeated every 28 days  PLAN:  Keaunna s now a little over 6 years out from definitive diagnosis of metastatic breast cancer. Clinically she is very stable. The plan is to continue the fulvestrantevery 28 days, exemestane daily, and palbociclib at the current dose, with the next cycle starting 03/14/2017  I think the symptoms she has in her hands are going to be partly due to Raynaud's phenomenon and partly due to carpal tunnel issues. She is going to wearing a splint on her red left wrist at night and see if that salts a problem.  She is due for restaging studies and I am putting her in for a PET scan early November.Note that her daughter will be leaving the country in early December (she will be traveling to [place]).so we will want to have this information available before then.  Otherwise the plan is to continue treatment as before. Theresa Wallace knows to call for any problems that may develop before the next visit here.   Magrinat, Theresa Dad, MD  03/12/17 11:45 AM Medical Oncology and Hematology [facility] [address] [place], [place] [address] Tel. [phone]    Fax. [phone]  This document serves as a record of services personally performed by Lurline Del, MD. It was created on her behalf by Theresa Wallace, a trained medical scribe. The creation of this record is based on the scribe's personal observations  and the provider's statements to them. This document has been checked and approved by the attending provider.

## 2017-03-12 ENCOUNTER — Telehealth: Payer: Self-pay | Admitting: Oncology

## 2017-03-12 ENCOUNTER — Ambulatory Visit (HOSPITAL_BASED_OUTPATIENT_CLINIC_OR_DEPARTMENT_OTHER): Payer: Medicare Other

## 2017-03-12 ENCOUNTER — Ambulatory Visit (HOSPITAL_BASED_OUTPATIENT_CLINIC_OR_DEPARTMENT_OTHER): Payer: Medicare Other | Admitting: Oncology

## 2017-03-12 ENCOUNTER — Other Ambulatory Visit (HOSPITAL_BASED_OUTPATIENT_CLINIC_OR_DEPARTMENT_OTHER): Payer: Medicare Other

## 2017-03-12 VITALS — BP 159/64 | HR 65 | Temp 98.3°F | Resp 18 | Ht 63.0 in | Wt 153.3 lb

## 2017-03-12 DIAGNOSIS — C787 Secondary malignant neoplasm of liver and intrahepatic bile duct: Secondary | ICD-10-CM

## 2017-03-12 DIAGNOSIS — Z170 Estrogen receptor positive status [ER+]: Secondary | ICD-10-CM

## 2017-03-12 DIAGNOSIS — C50922 Malignant neoplasm of unspecified site of left male breast: Secondary | ICD-10-CM

## 2017-03-12 DIAGNOSIS — Z5111 Encounter for antineoplastic chemotherapy: Secondary | ICD-10-CM

## 2017-03-12 DIAGNOSIS — C7802 Secondary malignant neoplasm of left lung: Secondary | ICD-10-CM

## 2017-03-12 DIAGNOSIS — C50919 Malignant neoplasm of unspecified site of unspecified female breast: Secondary | ICD-10-CM

## 2017-03-12 DIAGNOSIS — R200 Anesthesia of skin: Secondary | ICD-10-CM | POA: Diagnosis not present

## 2017-03-12 DIAGNOSIS — C50812 Malignant neoplasm of overlapping sites of left female breast: Secondary | ICD-10-CM

## 2017-03-12 DIAGNOSIS — R5383 Other fatigue: Secondary | ICD-10-CM | POA: Diagnosis not present

## 2017-03-12 DIAGNOSIS — M899 Disorder of bone, unspecified: Secondary | ICD-10-CM | POA: Diagnosis not present

## 2017-03-12 DIAGNOSIS — J449 Chronic obstructive pulmonary disease, unspecified: Secondary | ICD-10-CM

## 2017-03-12 DIAGNOSIS — C50912 Malignant neoplasm of unspecified site of left female breast: Secondary | ICD-10-CM

## 2017-03-12 DIAGNOSIS — J410 Simple chronic bronchitis: Secondary | ICD-10-CM

## 2017-03-12 LAB — CBC WITH DIFFERENTIAL/PLATELET
BASO%: 0.4 % (ref 0.0–2.0)
Basophils Absolute: 0 10*3/uL (ref 0.0–0.1)
EOS%: 1.6 % (ref 0.0–7.0)
Eosinophils Absolute: 0 10*3/uL (ref 0.0–0.5)
HEMATOCRIT: 31.9 % — AB (ref 34.8–46.6)
HEMOGLOBIN: 10.9 g/dL — AB (ref 11.6–15.9)
LYMPH#: 0.8 10*3/uL — AB (ref 0.9–3.3)
LYMPH%: 30.2 % (ref 14.0–49.7)
MCH: 36.2 pg — ABNORMAL HIGH (ref 25.1–34.0)
MCHC: 34.2 g/dL (ref 31.5–36.0)
MCV: 105.7 fL — ABNORMAL HIGH (ref 79.5–101.0)
MONO#: 0.2 10*3/uL (ref 0.1–0.9)
MONO%: 8.3 % (ref 0.0–14.0)
NEUT#: 1.6 10*3/uL (ref 1.5–6.5)
NEUT%: 59.5 % (ref 38.4–76.8)
Platelets: 137 10*3/uL — ABNORMAL LOW (ref 145–400)
RBC: 3.02 10*6/uL — ABNORMAL LOW (ref 3.70–5.45)
RDW: 13.7 % (ref 11.2–14.5)
WBC: 2.8 10*3/uL — ABNORMAL LOW (ref 3.9–10.3)

## 2017-03-12 LAB — COMPREHENSIVE METABOLIC PANEL
ALBUMIN: 4 g/dL (ref 3.5–5.0)
ALT: 13 U/L (ref 0–55)
AST: 19 U/L (ref 5–34)
Alkaline Phosphatase: 36 U/L — ABNORMAL LOW (ref 40–150)
Anion Gap: 8 mEq/L (ref 3–11)
BUN: 23.1 mg/dL (ref 7.0–26.0)
CALCIUM: 9.1 mg/dL (ref 8.4–10.4)
CHLORIDE: 102 meq/L (ref 98–109)
CO2: 24 mEq/L (ref 22–29)
CREATININE: 1.2 mg/dL — AB (ref 0.6–1.1)
EGFR: 39 mL/min/{1.73_m2} — ABNORMAL LOW (ref >60)
GLUCOSE: 92 mg/dL (ref 70–140)
POTASSIUM: 4.4 meq/L (ref 3.5–5.1)
SODIUM: 133 meq/L — AB (ref 136–145)
Total Bilirubin: 0.9 mg/dL (ref 0.20–1.20)
Total Protein: 6.8 g/dL (ref 6.4–8.3)

## 2017-03-12 MED ORDER — DENOSUMAB 120 MG/1.7ML ~~LOC~~ SOLN
120.0000 mg | Freq: Once | SUBCUTANEOUS | Status: AC
  Administered 2017-03-12: 120 mg via SUBCUTANEOUS
  Filled 2017-03-12: qty 1.7

## 2017-03-12 MED ORDER — FULVESTRANT 250 MG/5ML IM SOLN
500.0000 mg | Freq: Once | INTRAMUSCULAR | Status: AC
  Administered 2017-03-12: 500 mg via INTRAMUSCULAR
  Filled 2017-03-12: qty 10

## 2017-03-12 NOTE — Patient Instructions (Signed)
Fulvestrant injection What is this medicine? FULVESTRANT (ful VES trant) blocks the effects of estrogen. It is used to treat breast cancer. This medicine may be used for other purposes; ask your health care provider or pharmacist if you have questions. COMMON BRAND NAME(S): FASLODEX What should I tell my health care provider before I take this medicine? They need to know if you have any of these conditions: -bleeding problems -liver disease -low levels of platelets in the blood -an unusual or allergic reaction to fulvestrant, other medicines, foods, dyes, or preservatives -pregnant or trying to get pregnant -breast-feeding How should I use this medicine? This medicine is for injection into a muscle. It is usually given by a health care professional in a hospital or clinic setting. Talk to your pediatrician regarding the use of this medicine in children. Special care may be needed. Overdosage: If you think you have taken too much of this medicine contact a poison control center or emergency room at once. NOTE: This medicine is only for you. Do not share this medicine with others. What if I miss a dose? It is important not to miss your dose. Call your doctor or health care professional if you are unable to keep an appointment. What may interact with this medicine? -medicines that treat or prevent blood clots like warfarin, enoxaparin, and dalteparin This list may not describe all possible interactions. Give your health care provider a list of all the medicines, herbs, non-prescription drugs, or dietary supplements you use. Also tell them if you smoke, drink alcohol, or use illegal drugs. Some items may interact with your medicine. What should I watch for while using this medicine? Your condition will be monitored carefully while you are receiving this medicine. You will need important blood work done while you are taking this medicine. Do not become pregnant while taking this medicine or for  at least 1 year after stopping it. Women of child-bearing potential will need to have a negative pregnancy test before starting this medicine. Women should inform their doctor if they wish to become pregnant or think they might be pregnant. There is a potential for serious side effects to an unborn child. Men should inform their doctors if they wish to father a child. This medicine may lower sperm counts. Talk to your health care professional or pharmacist for more information. Do not breast-feed an infant while taking this medicine or for 1 year after the last dose. What side effects may I notice from receiving this medicine? Side effects that you should report to your doctor or health care professional as soon as possible: -allergic reactions like skin rash, itching or hives, swelling of the face, lips, or tongue -feeling faint or lightheaded, falls -pain, tingling, numbness, or weakness in the legs -signs and symptoms of infection like fever or chills; cough; flu-like symptoms; sore throat -vaginal bleeding Side effects that usually do not require medical attention (report to your doctor or health care professional if they continue or are bothersome): -aches, pains -constipation -diarrhea -headache -hot flashes -nausea, vomiting -pain at site where injected -stomach pain This list may not describe all possible side effects. Call your doctor for medical advice about side effects. You may report side effects to FDA at 1-800-FDA-1088. Where should I keep my medicine? This drug is given in a hospital or clinic and will not be stored at home. NOTE: This sheet is a summary. It may not cover all possible information. If you have questions about this medicine, talk to your   doctor, pharmacist, or health care provider.  2018 Elsevier/Gold Standard (2014-12-04 11:03:55) Denosumab injection What is this medicine? DENOSUMAB (den oh sue mab) slows bone breakdown. Prolia is used to treat osteoporosis in  women after menopause and in men. Delton See is used to treat a high calcium level due to cancer and to prevent bone fractures and other bone problems caused by multiple myeloma or cancer bone metastases. Delton See is also used to treat giant cell tumor of the bone. This medicine may be used for other purposes; ask your health care provider or pharmacist if you have questions. COMMON BRAND NAME(S): Prolia, XGEVA What should I tell my health care provider before I take this medicine? They need to know if you have any of these conditions: -dental disease -having surgery or tooth extraction -infection -kidney disease -low levels of calcium or Vitamin D in the blood -malnutrition -on hemodialysis -skin conditions or sensitivity -thyroid or parathyroid disease -an unusual reaction to denosumab, other medicines, foods, dyes, or preservatives -pregnant or trying to get pregnant -breast-feeding How should I use this medicine? This medicine is for injection under the skin. It is given by a health care professional in a hospital or clinic setting. If you are getting Prolia, a special MedGuide will be given to you by the pharmacist with each prescription and refill. Be sure to read this information carefully each time. For Prolia, talk to your pediatrician regarding the use of this medicine in children. Special care may be needed. For Delton See, talk to your pediatrician regarding the use of this medicine in children. While this drug may be prescribed for children as young as 13 years for selected conditions, precautions do apply. Overdosage: If you think you have taken too much of this medicine contact a poison control center or emergency room at once. NOTE: This medicine is only for you. Do not share this medicine with others. What if I miss a dose? It is important not to miss your dose. Call your doctor or health care professional if you are unable to keep an appointment. What may interact with this  medicine? Do not take this medicine with any of the following medications: -other medicines containing denosumab This medicine may also interact with the following medications: -medicines that lower your chance of fighting infection -steroid medicines like prednisone or cortisone This list may not describe all possible interactions. Give your health care provider a list of all the medicines, herbs, non-prescription drugs, or dietary supplements you use. Also tell them if you smoke, drink alcohol, or use illegal drugs. Some items may interact with your medicine. What should I watch for while using this medicine? Visit your doctor or health care professional for regular checks on your progress. Your doctor or health care professional may order blood tests and other tests to see how you are doing. Call your doctor or health care professional for advice if you get a fever, chills or sore throat, or other symptoms of a cold or flu. Do not treat yourself. This drug may decrease your body's ability to fight infection. Try to avoid being around people who are sick. You should make sure you get enough calcium and vitamin D while you are taking this medicine, unless your doctor tells you not to. Discuss the foods you eat and the vitamins you take with your health care professional. See your dentist regularly. Brush and floss your teeth as directed. Before you have any dental work done, tell your dentist you are receiving this medicine. Do  not become pregnant while taking this medicine or for 5 months after stopping it. Talk with your doctor or health care professional about your birth control options while taking this medicine. Women should inform their doctor if they wish to become pregnant or think they might be pregnant. There is a potential for serious side effects to an unborn child. Talk to your health care professional or pharmacist for more information. What side effects may I notice from receiving this  medicine? Side effects that you should report to your doctor or health care professional as soon as possible: -allergic reactions like skin rash, itching or hives, swelling of the face, lips, or tongue -bone pain -breathing problems -dizziness -jaw pain, especially after dental work -redness, blistering, peeling of the skin -signs and symptoms of infection like fever or chills; cough; sore throat; pain or trouble passing urine -signs of low calcium like fast heartbeat, muscle cramps or muscle pain; pain, tingling, numbness in the hands or feet; seizures -unusual bleeding or bruising -unusually weak or tired Side effects that usually do not require medical attention (report to your doctor or health care professional if they continue or are bothersome): -constipation -diarrhea -headache -joint pain -loss of appetite -muscle pain -runny nose -tiredness -upset stomach This list may not describe all possible side effects. Call your doctor for medical advice about side effects. You may report side effects to FDA at 1-800-FDA-1088. Where should I keep my medicine? This medicine is only given in a clinic, doctor's office, or other health care setting and will not be stored at home. NOTE: This sheet is a summary. It may not cover all possible information. If you have questions about this medicine, talk to your doctor, pharmacist, or health care provider.  2018 Elsevier/Gold Standard (2016-05-30 19:17:21)  

## 2017-03-12 NOTE — Telephone Encounter (Signed)
No 10/22 los  

## 2017-03-13 LAB — CANCER ANTIGEN 27.29: CAN 27.29: 277.4 U/mL — AB (ref 0.0–38.6)

## 2017-04-02 ENCOUNTER — Other Ambulatory Visit: Payer: Self-pay | Admitting: Oncology

## 2017-04-02 ENCOUNTER — Ambulatory Visit (HOSPITAL_COMMUNITY)
Admission: RE | Admit: 2017-04-02 | Discharge: 2017-04-02 | Disposition: A | Discharge status: Home / Self Care | Payer: Medicare Other | Source: Ambulatory Visit | Attending: Oncology | Admitting: Oncology

## 2017-04-02 DIAGNOSIS — R9389 Abnormal findings on diagnostic imaging of other specified body structures: Secondary | ICD-10-CM | POA: Insufficient documentation

## 2017-04-02 DIAGNOSIS — I2510 Atherosclerotic heart disease of native coronary artery without angina pectoris: Secondary | ICD-10-CM | POA: Insufficient documentation

## 2017-04-02 DIAGNOSIS — Z170 Estrogen receptor positive status [ER+]: Secondary | ICD-10-CM | POA: Diagnosis present

## 2017-04-02 DIAGNOSIS — I700 Atherosclerosis of aorta: Secondary | ICD-10-CM | POA: Insufficient documentation

## 2017-04-02 DIAGNOSIS — R911 Solitary pulmonary nodule: Secondary | ICD-10-CM | POA: Diagnosis not present

## 2017-04-02 DIAGNOSIS — C50812 Malignant neoplasm of overlapping sites of left female breast: Secondary | ICD-10-CM | POA: Insufficient documentation

## 2017-04-02 DIAGNOSIS — C50912 Malignant neoplasm of unspecified site of left female breast: Secondary | ICD-10-CM | POA: Insufficient documentation

## 2017-04-02 DIAGNOSIS — J449 Chronic obstructive pulmonary disease, unspecified: Secondary | ICD-10-CM

## 2017-04-02 DIAGNOSIS — K769 Liver disease, unspecified: Secondary | ICD-10-CM | POA: Diagnosis not present

## 2017-04-02 DIAGNOSIS — M899 Disorder of bone, unspecified: Secondary | ICD-10-CM | POA: Insufficient documentation

## 2017-04-02 LAB — GLUCOSE, CAPILLARY: Glucose-Capillary: 84 mg/dL (ref 65–99)

## 2017-04-02 MED ORDER — FLUDEOXYGLUCOSE F - 18 (FDG) INJECTION
8.6000 | Freq: Once | INTRAVENOUS | Status: AC | PRN
  Administered 2017-04-02: 8.6 via INTRAVENOUS

## 2017-04-09 ENCOUNTER — Telehealth: Payer: Self-pay | Admitting: Adult Health

## 2017-04-09 ENCOUNTER — Other Ambulatory Visit (HOSPITAL_BASED_OUTPATIENT_CLINIC_OR_DEPARTMENT_OTHER): Payer: Medicare Other

## 2017-04-09 ENCOUNTER — Ambulatory Visit (HOSPITAL_BASED_OUTPATIENT_CLINIC_OR_DEPARTMENT_OTHER): Payer: Medicare Other

## 2017-04-09 ENCOUNTER — Encounter: Payer: Self-pay | Admitting: Adult Health

## 2017-04-09 ENCOUNTER — Ambulatory Visit: Payer: Medicare Other | Admitting: Adult Health

## 2017-04-09 ENCOUNTER — Telehealth: Payer: Self-pay | Admitting: Pharmacy Technician

## 2017-04-09 VITALS — BP 150/78 | HR 66 | Temp 97.8°F | Resp 20 | Ht 63.0 in | Wt 153.1 lb

## 2017-04-09 DIAGNOSIS — K5900 Constipation, unspecified: Secondary | ICD-10-CM | POA: Diagnosis not present

## 2017-04-09 DIAGNOSIS — C50812 Malignant neoplasm of overlapping sites of left female breast: Secondary | ICD-10-CM

## 2017-04-09 DIAGNOSIS — C50912 Malignant neoplasm of unspecified site of left female breast: Secondary | ICD-10-CM

## 2017-04-09 DIAGNOSIS — R110 Nausea: Secondary | ICD-10-CM

## 2017-04-09 DIAGNOSIS — C7802 Secondary malignant neoplasm of left lung: Secondary | ICD-10-CM

## 2017-04-09 DIAGNOSIS — Z5111 Encounter for antineoplastic chemotherapy: Secondary | ICD-10-CM | POA: Diagnosis not present

## 2017-04-09 DIAGNOSIS — C787 Secondary malignant neoplasm of liver and intrahepatic bile duct: Secondary | ICD-10-CM

## 2017-04-09 DIAGNOSIS — M899 Disorder of bone, unspecified: Secondary | ICD-10-CM

## 2017-04-09 DIAGNOSIS — C50919 Malignant neoplasm of unspecified site of unspecified female breast: Secondary | ICD-10-CM

## 2017-04-09 DIAGNOSIS — C50922 Malignant neoplasm of unspecified site of left male breast: Secondary | ICD-10-CM

## 2017-04-09 DIAGNOSIS — E039 Hypothyroidism, unspecified: Secondary | ICD-10-CM

## 2017-04-09 DIAGNOSIS — J410 Simple chronic bronchitis: Secondary | ICD-10-CM

## 2017-04-09 DIAGNOSIS — Z170 Estrogen receptor positive status [ER+]: Principal | ICD-10-CM

## 2017-04-09 LAB — CBC WITH DIFFERENTIAL/PLATELET
BASO%: 1.8 % (ref 0.0–2.0)
Basophils Absolute: 0.1 10*3/uL (ref 0.0–0.1)
EOS ABS: 0.1 10*3/uL (ref 0.0–0.5)
EOS%: 2.1 % (ref 0.0–7.0)
HCT: 30.9 % — ABNORMAL LOW (ref 34.8–46.6)
HEMOGLOBIN: 10.8 g/dL — AB (ref 11.6–15.9)
LYMPH%: 34.4 % (ref 14.0–49.7)
MCH: 35.4 pg — ABNORMAL HIGH (ref 25.1–34.0)
MCHC: 35 g/dL (ref 31.5–36.0)
MCV: 101.3 fL — ABNORMAL HIGH (ref 79.5–101.0)
MONO#: 0.4 10*3/uL (ref 0.1–0.9)
MONO%: 10.6 % (ref 0.0–14.0)
NEUT%: 51.1 % (ref 38.4–76.8)
NEUTROS ABS: 1.7 10*3/uL (ref 1.5–6.5)
Platelets: 127 10*3/uL — ABNORMAL LOW (ref 145–400)
RBC: 3.05 10*6/uL — ABNORMAL LOW (ref 3.70–5.45)
RDW: 13.3 % (ref 11.2–14.5)
WBC: 3.4 10*3/uL — AB (ref 3.9–10.3)
lymph#: 1.2 10*3/uL (ref 0.9–3.3)

## 2017-04-09 LAB — COMPREHENSIVE METABOLIC PANEL
ALT: 14 U/L (ref 0–55)
ANION GAP: 9 meq/L (ref 3–11)
AST: 18 U/L (ref 5–34)
Albumin: 3.8 g/dL (ref 3.5–5.0)
Alkaline Phosphatase: 31 U/L — ABNORMAL LOW (ref 40–150)
BILIRUBIN TOTAL: 1.12 mg/dL (ref 0.20–1.20)
BUN: 21.8 mg/dL (ref 7.0–26.0)
CALCIUM: 9.3 mg/dL (ref 8.4–10.4)
CHLORIDE: 101 meq/L (ref 98–109)
CO2: 25 mEq/L (ref 22–29)
CREATININE: 1.2 mg/dL — AB (ref 0.6–1.1)
EGFR: 41 mL/min/{1.73_m2} — ABNORMAL LOW (ref >60)
Glucose: 82 mg/dl (ref 70–140)
Potassium: 4.8 mEq/L (ref 3.5–5.1)
Sodium: 135 mEq/L — ABNORMAL LOW (ref 136–145)
Total Protein: 6.5 g/dL (ref 6.4–8.3)

## 2017-04-09 MED ORDER — DENOSUMAB 120 MG/1.7ML ~~LOC~~ SOLN
120.0000 mg | Freq: Once | SUBCUTANEOUS | Status: AC
  Administered 2017-04-09: 120 mg via SUBCUTANEOUS
  Filled 2017-04-09: qty 1.7

## 2017-04-09 MED ORDER — FULVESTRANT 250 MG/5ML IM SOLN
500.0000 mg | Freq: Once | INTRAMUSCULAR | Status: AC
  Administered 2017-04-09: 500 mg via INTRAMUSCULAR
  Filled 2017-04-09: qty 10

## 2017-04-09 NOTE — Telephone Encounter (Signed)
Scheduled appt per 11/19 los - patient aware of appts - did not want AVS or calender - my chart active.

## 2017-04-09 NOTE — Telephone Encounter (Signed)
Oral Oncology Patient Advocate Encounter  Met patient in [place] to complete a renewal application for [employer] in an effort to keep the patient's out of pocket expense for Ibrance at $0.    Application completed and faxed to [phone].   [employer] patient assistance phone number for follow up is [phone].   This encounter will be updated until final determination.  Fabio Asa. Melynda Keller, [place] Patient [place] [phone] 04/09/2017 3:53 PM

## 2017-04-09 NOTE — Progress Notes (Signed)
ID: Theresa Wallace   DOB: 1924-02-27  MR#: [id number]  [id number]#:[id number]  PCP: [name], MD GYN: SU:  OTHER MD:  CHIEF COMPLAINT:  Metastatic Breast Cancer  CURRENT TREATMENT: Exemestane, fulvestrant, palbociclib, Delton See   INTERVAL HISTORY: Theresa Wallace returns today for follow-up and treatment of her estrogen receptor positive metastatic breast cancer. She is accompanied by her daughter. She continues to take Exemestane. She has had some mild nausea that resolves with sleep.  This has only been going on for a couple of days.    She is also on fulvestrant, which she receives every 28 days, with a dose due today. She continues to tolerate this well.  She continues on monthly Xgeva and is tolerates it well.    Finally she receives palbociclib 100 mg daily. This is her off week and she will start again on this coming Wednesday, 04/11/2017. She has increased fatigue amd she will take naps for this in order to to alleviate her fatigue while taking the Palbociclib.    REVIEW OF SYSTEMS: Theresa Wallace is constipated.  She isn't sure why.  She takes thyroid medications and she hasn't had her thyroid checked in a while.  She is drinking plenty of water.  She denies any new pain or concerns today.  A detailed ROS is otherwise negative.     HISTORY OF PRESENT ILLNESS: From the prior summary:  Theresa Wallace is an 83 y.o.  [place] woman I saw briefly when she moved to this area in 2007.  She had had a left breast cancer removed at [facility] in [place] in [phone] ([place]).  It was grade 2, measured 1 cm maximally and was strongly estrogen and progesterone-receptor positive.  HER-2 was not checked.  This was T1b NX invasive ductal carcinoma, stage I, and she was treated after radiation with anastrozole for several years, eventually switching to Evista primarily for cost reasons.   More recently, she presented to Dr. Inda Merlin with fatigue and a dry cough.  He auscultated dullness to percussion  at the left base and obtained a chest x-ray which confirmed the presence of a left pleural effusion.   Dr. Inda Merlin scheduled Theresa Wallace for an ultrasound-guided left thoracentesis, and this was performed November 09, 2010.  Approximately 1 L of amber-colored fluid was removed.  Cytology from this procedure ([id number]) showed only reactive mesothelial cells.   The patient was then referred here and set up for a CT of the chest and a PET scan.  The CT of the chest on June 20th showed 2 slightly enlarged left axillary lymph nodes with some irregular contours but more importantly multiple pleural nodules in the left chest measuring up to 4.6 cm.  The right chest was normal.  There was no pericardial effusion.  There was only a small to moderate left pleural effusion remaining, and aside from left lower lobe collapse or consolidation, there was no evidence of lung parenchymal involvement.  There was no evidence of bone involvement and also no calcified pleural plaques and no worrisome bone involvement.  PET scan on June 27th showed the nodularity along the left pleura to be intensely hypermetabolic.  For example the large lobe nodule measuring 4.5 cm had an SUV max of 12.9.  There was no hypermetabolic mediastinal nodal activity.  The to left axillary lymph nodes were very mildly hypermetabolic.  The left breast was clear.  There was 1 hypermetabolic lesion in the liver measuring 3.2 cm.  On June 27th, Dr. [name] performed fine-needle  aspiration of a 7 mm abnormal-appearing lymph node in the lower left axilla.  The cytology from this procedure, however, ([name]) also was inconclusive showing no evidence of nodal tissue, mostly blood and fatty tissue.   Her subsequent history is as detailed below   PAST MEDICAL HISTORY: Past Medical History:  Diagnosis Date  . Breast cancer metastasized to multiple sites ([place]) 06/07/2011  . Cancer ([place])    breast ca  . Hypertension   1. Hypercholesterolemia. 2. Benign  tremor. 3. Sigmoid diverticulosis. 4. Onychomycosis. 5. Irritable bowel syndrome. 6. GERD. 7. History of palpitations with Holter monitor in 2008 showing PACs. 8. History of hysterectomy with no salpingo-oophorectomy. 9. History of cholecystectomy. 10. History of bilateral knee arthroscopic surgery. History of hemorrhoid surgery.  FAMILY HISTORY [name] died in an automobile accident.  [name] died with "bone cancer."  The patient had 3 sisters.  One had breast cancer develop in her 46s.  There is also a cousin with breast cancer but no history of ovarian cancer or other history of cancer in first-degree relatives.  GYNECOLOGIC HISTORY: She is GX, P4, first pregnancy to term at age 48.  She was on the hormone replacement after her hysterectomy but only briefly and stopped when she had the initial diagnosis of breast cancer in 1996.  SOCIAL HISTORY: (Updated May 2018) She worked as a Network engineer.  She has been widowed since 2005, her husband [name] dying after a long illness involving strokes and a myocardial infarction.  Her significant other [name] who is a retired Chief Financial Officer is now suffering from dementia and is in a skilled nursing facility. The patient lives with her daughter [name] who used to be a Network engineer for Dr. Inda Merlin, and [name]'s husband, [name], who is a physician's assistant with Dr. Durward Fortes.  The patient has 12 grandchildren and 5 great-grandchildren. Incidentally [name] has a son in [place].   ADVANCED DIRECTIVES: In place  HEALTH MAINTENANCE: (Updated 02/26/2013) Social History   Tobacco Use  . Smoking status: Former Smoker    Last attempt to quit: 05/22/1968    Years since quitting: 48.9  . Smokeless tobacco: Never Used  Substance Use Topics  . Alcohol use: Yes  . Drug use: No     Colonoscopy:  PAP: s/p remote hysterectomy  Bone density:  10/23/2011, normal  Lipid panel: Dr. Inda Merlin, UTD   Allergies  Allergen Reactions  .  Ciprofloxacin   . Epinephrine   . Tamoxifen   . Mupirocin Rash    Current Outpatient Medications  Medication Sig Dispense Refill  . aspirin 81 MG tablet Take 81 mg by mouth daily.    . Calcium Carbonate-Vitamin D (CALCIUM-VITAMIN D) 500-200 MG-UNIT per tablet Take 1 tablet by mouth 2 (two) times daily with a meal.    . cholecalciferol (VITAMIN D) 400 UNITS TABS Take 2,000 Units by mouth daily.    Marland Kitchen exemestane (AROMASIN) 25 MG tablet Take 1 tablet (25 mg total) by mouth daily. 90 tablet 3  . Glucosamine HCl-MSM (GLUCOSAMINE-MSM PO) Take 1 tablet by mouth daily.    Marland Kitchen levothyroxine (SYNTHROID, LEVOTHROID) 25 MCG tablet Take 25 mcg by mouth daily.    Marland Kitchen losartan (COZAAR) 50 MG tablet     . metroNIDAZOLE (METROGEL) 0.75 % gel Apply topically 2 (two) times daily. For rosacea    . Multiple Vitamin (MULTIVITAMIN) tablet Take 1 tablet by mouth daily.    . palbociclib (IBRANCE) 100 MG capsule Take 1 capsule (100 mg total) by mouth  daily with breakfast. Take whole with food. 21 capsule 6  . propranolol ER (INDERAL LA) 60 MG 24 hr capsule      No current facility-administered medications for this visit.     OBJECTIVE:   Vitals:   04/09/17 1135  BP: (!) 150/78  Pulse: 66  Resp: 20  Temp: 97.8 F (36.6 C)  SpO2: 99%   Body mass index is 27.12 kg/m.  Filed Weights   04/09/17 1135  Weight: 153 lb 1.6 oz (69.4 kg)  GENERAL: Patient is a well appearing female in no acute distress HEENT:  Sclerae anicteric.  Oropharynx clear and moist. No ulcerations or evidence of oropharyngeal candidiasis. Neck is supple.  NODES:  No cervical, supraclavicular, or axillary lymphadenopathy palpated.  BREAST EXAM:  Deferred. LUNGS:  Clear to auscultation bilaterally.  No wheezes or rhonchi. HEART:  Regular rate and rhythm. No murmur appreciated. ABDOMEN:  Soft, nontender.  Positive, normoactive bowel sounds. No organomegaly palpated. MSK:  No focal spinal tenderness to palpation. Full range of motion  bilaterally in the upper extremities. EXTREMITIES:  No peripheral edema.   SKIN:  Clear with no obvious rashes or skin changes. No nail dyscrasia. NEURO:  Nonfocal. Well oriented.  Appropriate affect.  Marland Kitchen   LAB RESULTS: Lab Results  Component Value Date   WBC 3.4 (L) 04/09/2017   NEUTROABS 1.7 04/09/2017   HGB 10.8 (L) 04/09/2017   HCT 30.9 (L) 04/09/2017   MCV 101.3 (H) 04/09/2017   PLT 127 (L) 04/09/2017      Chemistry      Component Value Date/Time   NA 133 (L) 03/12/2017 1056   K 4.4 03/12/2017 1056   CL 104 10/24/2012 1014   CO2 24 03/12/2017 1056   BUN 23.1 03/12/2017 1056   CREATININE 1.2 (H) 03/12/2017 1056      Component Value Date/Time   CALCIUM 9.1 03/12/2017 1056   ALKPHOS 36 (L) 03/12/2017 1056   AST 19 03/12/2017 1056   ALT 13 03/12/2017 1056   BILITOT 0.90 03/12/2017 1056      IMAGING STUDIES: CA-27-29 results reviewed with the patient and her daughter: There is a favorable continuing trend  ASSESSMENT: (1) Status post left lumpectomy in 1996 for a T1b N0 grade 2 invasive ductal carcinoma which was strongly estrogen and progesterone receptor positive, treated with radiation, then anastrozole, then Evista, all treatment discontinued July 2012  METASTATIC DISEASE: July 2012 (2) recurrence to the lining of the left lung and liver documented by liver biopsy July 2012, showing metastatic adenocarcinoma estrogen and progesterone receptor positive at 96% and 97% respectively; there was no Her-2 amplification.   (3)  She has been on exemestane since early July of 2012, with chest CT scan April 2013 showing complete resolution of her lung and liver metastases  (a) bone density in June of 08/09/2011 was normal  (b) bone density 03/20/2016 was normal with a T score of -0.6  (c) exemestane continued at the time of disease progression noted May 2018  (4) MRI of the lumbar spine 09-2012 showed significant degenerative disease but no evidence of metastatic disease to  bone.  (5) disease progression noted May 2018: CT scan shows liver, lung, and bone lesions  (6) fulvestrant started 09/26/2016, palbociclib added 10/24/2016 at 125 mg daily, 21 days on. 7 days off, decreased to 132m daily on 02/14/2017, 21 days on, 7 days off  (7) denosumab/Xgeva started 11/21/2016, repeated every 28 days  PLAN:   RTiffaniis doing well today. Her CBC  remains stable.  She continues to tolerate her treatment well.  She will proceed with Palbociclib on 11/21.  I gave the patient and her daughter copies of labs, and the PET scan recently done.  It demonstrated control. They verbalized understanding of the results.    I suggested Oria take a stool softener or miralax for her constipation.  It could be contributing to her nausea.  She will take this and see.    Bridgette will f/u in 4 weeks for labs, appt with Dr. Jana Hakim, and an injection.  Otherwise the plan is to continue treatment as before. [name] knows to call for any problems that may develop before the next visit here.  A total of (30) minutes of face-to-face time was spent with this patient with greater than 50% of that time in counseling and care-coordination.   Wilber Bihari, NP 04/09/17 11:44 AM Medical Oncology and Hematology [facility] [address] [place], [place] [address] Tel. [phone]    Fax. [phone]

## 2017-04-09 NOTE — Patient Instructions (Signed)
Fulvestrant injection What is this medicine? FULVESTRANT (ful VES trant) blocks the effects of estrogen. It is used to treat breast cancer. This medicine may be used for other purposes; ask your health care provider or pharmacist if you have questions. COMMON BRAND NAME(S): FASLODEX What should I tell my health care provider before I take this medicine? They need to know if you have any of these conditions: -bleeding problems -liver disease -low levels of platelets in the blood -an unusual or allergic reaction to fulvestrant, other medicines, foods, dyes, or preservatives -pregnant or trying to get pregnant -breast-feeding How should I use this medicine? This medicine is for injection into a muscle. It is usually given by a health care professional in a hospital or clinic setting. Talk to your pediatrician regarding the use of this medicine in children. Special care may be needed. Overdosage: If you think you have taken too much of this medicine contact a poison control center or emergency room at once. NOTE: This medicine is only for you. Do not share this medicine with others. What if I miss a dose? It is important not to miss your dose. Call your doctor or health care professional if you are unable to keep an appointment. What may interact with this medicine? -medicines that treat or prevent blood clots like warfarin, enoxaparin, and dalteparin This list may not describe all possible interactions. Give your health care provider a list of all the medicines, herbs, non-prescription drugs, or dietary supplements you use. Also tell them if you smoke, drink alcohol, or use illegal drugs. Some items may interact with your medicine. What should I watch for while using this medicine? Your condition will be monitored carefully while you are receiving this medicine. You will need important blood work done while you are taking this medicine. Do not become pregnant while taking this medicine or for  at least 1 year after stopping it. Women of child-bearing potential will need to have a negative pregnancy test before starting this medicine. Women should inform their doctor if they wish to become pregnant or think they might be pregnant. There is a potential for serious side effects to an unborn child. Men should inform their doctors if they wish to father a child. This medicine may lower sperm counts. Talk to your health care professional or pharmacist for more information. Do not breast-feed an infant while taking this medicine or for 1 year after the last dose. What side effects may I notice from receiving this medicine? Side effects that you should report to your doctor or health care professional as soon as possible: -allergic reactions like skin rash, itching or hives, swelling of the face, lips, or tongue -feeling faint or lightheaded, falls -pain, tingling, numbness, or weakness in the legs -signs and symptoms of infection like fever or chills; cough; flu-like symptoms; sore throat -vaginal bleeding Side effects that usually do not require medical attention (report to your doctor or health care professional if they continue or are bothersome): -aches, pains -constipation -diarrhea -headache -hot flashes -nausea, vomiting -pain at site where injected -stomach pain This list may not describe all possible side effects. Call your doctor for medical advice about side effects. You may report side effects to FDA at 1-800-FDA-1088. Where should I keep my medicine? This drug is given in a hospital or clinic and will not be stored at home. NOTE: This sheet is a summary. It may not cover all possible information. If you have questions about this medicine, talk to your   doctor, pharmacist, or health care provider.  2018 Elsevier/Gold Standard (2014-12-04 11:03:55) Denosumab injection What is this medicine? DENOSUMAB (den oh sue mab) slows bone breakdown. Prolia is used to treat osteoporosis in  women after menopause and in men. Delton See is used to treat a high calcium level due to cancer and to prevent bone fractures and other bone problems caused by multiple myeloma or cancer bone metastases. Delton See is also used to treat giant cell tumor of the bone. This medicine may be used for other purposes; ask your health care provider or pharmacist if you have questions. COMMON BRAND NAME(S): Prolia, XGEVA What should I tell my health care provider before I take this medicine? They need to know if you have any of these conditions: -dental disease -having surgery or tooth extraction -infection -kidney disease -low levels of calcium or Vitamin D in the blood -malnutrition -on hemodialysis -skin conditions or sensitivity -thyroid or parathyroid disease -an unusual reaction to denosumab, other medicines, foods, dyes, or preservatives -pregnant or trying to get pregnant -breast-feeding How should I use this medicine? This medicine is for injection under the skin. It is given by a health care professional in a hospital or clinic setting. If you are getting Prolia, a special MedGuide will be given to you by the pharmacist with each prescription and refill. Be sure to read this information carefully each time. For Prolia, talk to your pediatrician regarding the use of this medicine in children. Special care may be needed. For Delton See, talk to your pediatrician regarding the use of this medicine in children. While this drug may be prescribed for children as young as 13 years for selected conditions, precautions do apply. Overdosage: If you think you have taken too much of this medicine contact a poison control center or emergency room at once. NOTE: This medicine is only for you. Do not share this medicine with others. What if I miss a dose? It is important not to miss your dose. Call your doctor or health care professional if you are unable to keep an appointment. What may interact with this  medicine? Do not take this medicine with any of the following medications: -other medicines containing denosumab This medicine may also interact with the following medications: -medicines that lower your chance of fighting infection -steroid medicines like prednisone or cortisone This list may not describe all possible interactions. Give your health care provider a list of all the medicines, herbs, non-prescription drugs, or dietary supplements you use. Also tell them if you smoke, drink alcohol, or use illegal drugs. Some items may interact with your medicine. What should I watch for while using this medicine? Visit your doctor or health care professional for regular checks on your progress. Your doctor or health care professional may order blood tests and other tests to see how you are doing. Call your doctor or health care professional for advice if you get a fever, chills or sore throat, or other symptoms of a cold or flu. Do not treat yourself. This drug may decrease your body's ability to fight infection. Try to avoid being around people who are sick. You should make sure you get enough calcium and vitamin D while you are taking this medicine, unless your doctor tells you not to. Discuss the foods you eat and the vitamins you take with your health care professional. See your dentist regularly. Brush and floss your teeth as directed. Before you have any dental work done, tell your dentist you are receiving this medicine. Do  not become pregnant while taking this medicine or for 5 months after stopping it. Talk with your doctor or health care professional about your birth control options while taking this medicine. Women should inform their doctor if they wish to become pregnant or think they might be pregnant. There is a potential for serious side effects to an unborn child. Talk to your health care professional or pharmacist for more information. What side effects may I notice from receiving this  medicine? Side effects that you should report to your doctor or health care professional as soon as possible: -allergic reactions like skin rash, itching or hives, swelling of the face, lips, or tongue -bone pain -breathing problems -dizziness -jaw pain, especially after dental work -redness, blistering, peeling of the skin -signs and symptoms of infection like fever or chills; cough; sore throat; pain or trouble passing urine -signs of low calcium like fast heartbeat, muscle cramps or muscle pain; pain, tingling, numbness in the hands or feet; seizures -unusual bleeding or bruising -unusually weak or tired Side effects that usually do not require medical attention (report to your doctor or health care professional if they continue or are bothersome): -constipation -diarrhea -headache -joint pain -loss of appetite -muscle pain -runny nose -tiredness -upset stomach This list may not describe all possible side effects. Call your doctor for medical advice about side effects. You may report side effects to FDA at 1-800-FDA-1088. Where should I keep my medicine? This medicine is only given in a clinic, doctor's office, or other health care setting and will not be stored at home. NOTE: This sheet is a summary. It may not cover all possible information. If you have questions about this medicine, talk to your doctor, pharmacist, or health care provider.  2018 Elsevier/Gold Standard (2016-05-30 19:17:21)  

## 2017-04-10 LAB — CANCER ANTIGEN 27.29: CA 27.29: 192.8 U/mL — ABNORMAL HIGH (ref 0.0–38.6)

## 2017-04-27 ENCOUNTER — Other Ambulatory Visit: Payer: Self-pay | Admitting: Oncology

## 2017-05-04 NOTE — Progress Notes (Signed)
ID: Theresa Wallace   DOB: 1923-07-22  MR#: [id number]  WFU#:[id number]  PCP: [name], MD GYN: SU:  OTHER MD:  CHIEF COMPLAINT:  Metastatic Breast Cancer  CURRENT TREATMENT: Exemestane, fulvestrant, palbociclib, Delton See   INTERVAL HISTORY: Theresa Wallace returns today for follow-up and treatment of her estrogen receptor positive breast cancer, accompanied by her son-in-law, Aaron Edelman. She continues on exemestane, with good tolerance. She reports that she sometimes gets nausea after she takes it at night after dinner. She denies having hot flashes.  She continues on fulvestrant with a dose due today. She tolerates this well.   She receives monthly Xgeva. She also tolerates this well.   She is also on palbociclib, at 100 mg daily, 21 days on, 7 days off. She notes that she has some fatigue. She takes it in the morning and then lays down for 20 minutes, which helps. She reports that sleeps less than 50% of the day.   REVIEW OF SYSTEMS: Janelli reports that she is doing well. She reports that her significant other, Theresa Wallace, is not doing well and that his condition is getting worse. She notes that she likes to read. She notes that she gets a significant amount of help with her Alzheimer's  from her family. For exercise, she notes that she doesn't do a lot of walking, but when she does she uses either her walker or a cane. She denies unusual headaches, visual changes, nausea, vomiting, or dizziness. There has been no unusual cough, phlegm production, or pleurisy. This been no change in bowel or bladder habits. She denies unexplained fatigue or unexplained weight loss, bleeding, rash, or fever. A detailed review of systems was otherwise stable.    HISTORY OF PRESENT ILLNESS: From the prior summary:  Theresa Wallace is an 83 y.o.  [place] woman I saw briefly when she moved to this area in 2007.  She had had a left breast cancer removed at [facility] in [place] in [phone] ([place]).  It  was grade 2, measured 1 cm maximally and was strongly estrogen and progesterone-receptor positive.  HER-2 was not checked.  This was T1b NX invasive ductal carcinoma, stage I, and she was treated after radiation with anastrozole for several years, eventually switching to Evista primarily for cost reasons.   More recently, she presented to Dr. Inda Merlin with fatigue and a dry cough.  He auscultated dullness to percussion at the left base and obtained a chest x-ray which confirmed the presence of a left pleural effusion.   Dr. Inda Merlin scheduled Theresa Wallace for an ultrasound-guided left thoracentesis, and this was performed November 09, 2010.  Approximately 1 L of amber-colored fluid was removed.  Cytology from this procedure ([id number]) showed only reactive mesothelial cells.   The patient was then referred here and set up for a CT of the chest and a PET scan.  The CT of the chest on June 20th showed 2 slightly enlarged left axillary lymph nodes with some irregular contours but more importantly multiple pleural nodules in the left chest measuring up to 4.6 cm.  The right chest was normal.  There was no pericardial effusion.  There was only a small to moderate left pleural effusion remaining, and aside from left lower lobe collapse or consolidation, there was no evidence of lung parenchymal involvement.  There was no evidence of bone involvement and also no calcified pleural plaques and no worrisome bone involvement.  PET scan on June 27th showed the nodularity along the left pleura to be  intensely hypermetabolic.  For example the large lobe nodule measuring 4.5 cm had an SUV max of 12.9.  There was no hypermetabolic mediastinal nodal activity.  The to left axillary lymph nodes were very mildly hypermetabolic.  The left breast was clear.  There was 1 hypermetabolic lesion in the liver measuring 3.2 cm.  On June 27th, Dr. [name] performed fine-needle aspiration of a 7 mm abnormal-appearing lymph node in the lower left axilla.   The cytology from this procedure, however, ([name]) also was inconclusive showing no evidence of nodal tissue, mostly blood and fatty tissue.   Her subsequent history is as detailed below   PAST MEDICAL HISTORY: Past Medical History:  Diagnosis Date  . Breast cancer metastasized to multiple sites ([place]) 06/07/2011  . Cancer ([place])    breast ca  . Hypertension   1. Hypercholesterolemia. 2. Benign tremor. 3. Sigmoid diverticulosis. 4. Onychomycosis. 5. Irritable bowel syndrome. 6. GERD. 7. History of palpitations with Holter monitor in 2008 showing PACs. 8. History of hysterectomy with no salpingo-oophorectomy. 9. History of cholecystectomy. 10. History of bilateral knee arthroscopic surgery. History of hemorrhoid surgery.  FAMILY HISTORY [name] died in an automobile accident.  [name] died with "bone cancer."  The patient had 3 sisters.  One had breast cancer develop in her 86s.  There is also a cousin with breast cancer but no history of ovarian cancer or other history of cancer in first-degree relatives.  GYNECOLOGIC HISTORY: She is GX, P4, first pregnancy to term at age 29.  She was on the hormone replacement after her hysterectomy but only briefly and stopped when she had the initial diagnosis of breast cancer in 1996.  SOCIAL HISTORY: (Updated May 2018) She worked as a Network engineer.  She has been widowed since 2005, her husband [name] dying after a long illness involving strokes and a myocardial infarction.  Her significant other [name] who is a retired Chief Financial Officer is now suffering from dementia and is in a skilled nursing facility. The patient lives with her daughter Theresa Wallace who used to be a Network engineer for Dr. Inda Merlin, and [name]'s husband, Theresa Wallace, who is a physician's assistant with Dr. Durward Fortes.  The patient has 12 grandchildren and 5 great-grandchildren. Incidentally Theresa Wallace has a son in [place].   ADVANCED DIRECTIVES: In place  HEALTH  MAINTENANCE: (Updated 02/26/2013) Social History   Tobacco Use  . Smoking status: Former Smoker    Last attempt to quit: 05/22/1968    Years since quitting: 48.9  . Smokeless tobacco: Never Used  Substance Use Topics  . Alcohol use: Yes  . Drug use: No     Colonoscopy:  PAP: s/p remote hysterectomy  Bone density:  10/23/2011, normal  Lipid panel: Dr. Inda Merlin, UTD   Allergies  Allergen Reactions  . Ciprofloxacin   . Epinephrine   . Tamoxifen   . Mupirocin Rash    Current Outpatient Medications  Medication Sig Dispense Refill  . aspirin 81 MG tablet Take 81 mg by mouth daily.    . Calcium Carbonate-Vitamin D (CALCIUM-VITAMIN D) 500-200 MG-UNIT per tablet Take 1 tablet by mouth 2 (two) times daily with a meal.    . cholecalciferol (VITAMIN D) 400 UNITS TABS Take 2,000 Units by mouth daily.    Marland Kitchen exemestane (AROMASIN) 25 MG tablet Take 1 tablet (25 mg total) by mouth daily. 90 tablet 3  . Glucosamine HCl-MSM (GLUCOSAMINE-MSM PO) Take 1 tablet by mouth daily.    Marland Kitchen levothyroxine (SYNTHROID, LEVOTHROID) 25 MCG tablet Take  25 mcg by mouth daily.    Marland Kitchen losartan (COZAAR) 50 MG tablet     . metroNIDAZOLE (METROGEL) 0.75 % gel Apply topically 2 (two) times daily. For rosacea    . Multiple Vitamin (MULTIVITAMIN) tablet Take 1 tablet by mouth daily.    . palbociclib (IBRANCE) 100 MG capsule Take 1 capsule (100 mg total) by mouth daily with breakfast. Take whole with food. 21 capsule 6  . propranolol ER (INDERAL LA) 60 MG 24 hr capsule      No current facility-administered medications for this visit.     OBJECTIVE: Elderly white woman in no acute distress  Vitals:   05/07/17 1100  BP: (!) 165/68  Pulse: 67  Resp: 17  Temp: (!) 97 F (36.1 C)  SpO2: 97%   Body mass index is 27.56 kg/m.  Filed Weights   05/07/17 1100  Weight: 155 lb 9.6 oz (70.6 kg)   Sclerae unicteric, EOMs intact Oropharynx clear and moist No cervical or supraclavicular adenopathy Lungs no rales or  rhonchi Heart regular rate and rhythm Abd soft, nontender, positive bowel sounds MSK no focal spinal tenderness, no upper extremity lymphedema; uses a walker Neuro: nonfocal, well oriented, appropriate affect Breasts: Right breast is benign.  The left breast is status post lumpectomy.  There is no evidence of active disease.  Both axillae are benign.  Marland Kitchen   LAB RESULTS: Lab Results  Component Value Date   WBC 3.6 (L) 05/07/2017   NEUTROABS 2.1 05/07/2017   HGB 10.8 (L) 05/07/2017   HCT 30.8 (L) 05/07/2017   MCV 100.3 05/07/2017   PLT 134 (L) 05/07/2017      Chemistry      Component Value Date/Time   NA 135 (L) 04/09/2017 1115   K 4.8 04/09/2017 1115   CL 104 10/24/2012 1014   CO2 25 04/09/2017 1115   BUN 21.8 04/09/2017 1115   CREATININE 1.2 (H) 04/09/2017 1115      Component Value Date/Time   CALCIUM 9.3 04/09/2017 1115   ALKPHOS 31 (L) 04/09/2017 1115   AST 18 04/09/2017 1115   ALT 14 04/09/2017 1115   BILITOT 1.12 04/09/2017 1115      IMAGING STUDIES: CA-27-29 results reviewed.  As of November there continues to be a downward trend which is favorable  ASSESSMENT: (1) Status post left lumpectomy in 1996 for a T1b N0 grade 2 invasive ductal carcinoma which was strongly estrogen and progesterone receptor positive, treated with radiation, then anastrozole, then Evista, all treatment discontinued July 2012  METASTATIC DISEASE: July 2012 (2) recurrence to the lining of the left lung and liver documented by liver biopsy July 2012, showing metastatic adenocarcinoma estrogen and progesterone receptor positive at 96% and 97% respectively; there was no Her-2 amplification.   (3)  She has been on exemestane since early July of 2012, with chest CT scan April 2013 showing complete resolution of her lung and liver metastases  (a) bone density in June of 08/09/2011 was normal  (b) bone density 03/20/2016 was normal with a T score of -0.6  (c) exemestane continued at the time of  disease progression noted May 2018  (4) MRI of the lumbar spine 09-2012 showed significant degenerative disease but no evidence of metastatic disease to bone.  (5) disease progression noted May 2018: CT scan shows liver, lung, and bone lesions  (6) fulvestrant started 09/26/2016, palbociclib added 10/24/2016 at 125 mg daily, 21 days on. 7 days off  (a) dose decreased to 165m daily on 02/14/2017  (  7) denosumab/Xgeva started 11/21/2016, repeated every 28 days  PLAN:   Alnisa is now a little more than 5 years out from definite diagnosis of metastatic breast cancer, with no clinical evidence of active disease.  This is very favorable.  She is half a year out from evidence of disease progression.  She is tolerating her intensified treatment well, and it appears to be working as evidenced by the recent PET scan and also by the continuing drop in her CA-27-29  Accordingly we are making no change in her overall treatment plan.  She will be treated today and monthly.  She will see [place] monthly at least for the next 3 months to make sure her Ibrance dose does not need to be adjusted.  We are planning restaging studies perhaps 6 months from now  She knows to call for any other issues that may develop before the next visit.  Philipp Callegari, Virgie Dad, MD  05/07/17 11:19 AM Medical Oncology and Hematology [facility] [address] [place], [place] [address] Tel. [phone]    Fax. [phone]  This document serves as a record of services personally performed by Lurline Del, MD. It was created on his behalf by Sheron Nightingale, a trained medical scribe. The creation of this record is based on the scribe's personal observations and the provider's statements to them.  I have reviewed the above documentation for accuracy and completeness, and I agree with the above.

## 2017-05-07 ENCOUNTER — Other Ambulatory Visit: Payer: Self-pay | Admitting: Oncology

## 2017-05-07 ENCOUNTER — Encounter: Payer: Self-pay | Admitting: Oncology

## 2017-05-07 ENCOUNTER — Telehealth: Payer: Self-pay | Admitting: Oncology

## 2017-05-07 ENCOUNTER — Ambulatory Visit (HOSPITAL_BASED_OUTPATIENT_CLINIC_OR_DEPARTMENT_OTHER): Payer: Medicare Other

## 2017-05-07 ENCOUNTER — Ambulatory Visit: Payer: Medicare Other | Admitting: Oncology

## 2017-05-07 ENCOUNTER — Other Ambulatory Visit (HOSPITAL_BASED_OUTPATIENT_CLINIC_OR_DEPARTMENT_OTHER): Payer: Medicare Other

## 2017-05-07 VITALS — BP 165/68 | HR 67 | Temp 97.0°F | Resp 17 | Ht 63.0 in | Wt 155.6 lb

## 2017-05-07 DIAGNOSIS — C50919 Malignant neoplasm of unspecified site of unspecified female breast: Secondary | ICD-10-CM

## 2017-05-07 DIAGNOSIS — M899 Disorder of bone, unspecified: Secondary | ICD-10-CM | POA: Diagnosis not present

## 2017-05-07 DIAGNOSIS — C50912 Malignant neoplasm of unspecified site of left female breast: Secondary | ICD-10-CM

## 2017-05-07 DIAGNOSIS — C7802 Secondary malignant neoplasm of left lung: Secondary | ICD-10-CM

## 2017-05-07 DIAGNOSIS — C50922 Malignant neoplasm of unspecified site of left male breast: Secondary | ICD-10-CM

## 2017-05-07 DIAGNOSIS — C787 Secondary malignant neoplasm of liver and intrahepatic bile duct: Secondary | ICD-10-CM

## 2017-05-07 DIAGNOSIS — Z170 Estrogen receptor positive status [ER+]: Secondary | ICD-10-CM

## 2017-05-07 DIAGNOSIS — Z5111 Encounter for antineoplastic chemotherapy: Secondary | ICD-10-CM

## 2017-05-07 DIAGNOSIS — C50812 Malignant neoplasm of overlapping sites of left female breast: Secondary | ICD-10-CM

## 2017-05-07 DIAGNOSIS — Z79899 Other long term (current) drug therapy: Secondary | ICD-10-CM

## 2017-05-07 DIAGNOSIS — J410 Simple chronic bronchitis: Secondary | ICD-10-CM

## 2017-05-07 DIAGNOSIS — E039 Hypothyroidism, unspecified: Secondary | ICD-10-CM

## 2017-05-07 LAB — CBC WITH DIFFERENTIAL/PLATELET
BASO%: 1.4 % (ref 0.0–2.0)
BASOS ABS: 0.1 10*3/uL (ref 0.0–0.1)
EOS%: 1.9 % (ref 0.0–7.0)
Eosinophils Absolute: 0.1 10*3/uL (ref 0.0–0.5)
HEMATOCRIT: 30.8 % — AB (ref 34.8–46.6)
HGB: 10.8 g/dL — ABNORMAL LOW (ref 11.6–15.9)
LYMPH#: 1.1 10*3/uL (ref 0.9–3.3)
LYMPH%: 29.8 % (ref 14.0–49.7)
MCH: 35.2 pg — ABNORMAL HIGH (ref 25.1–34.0)
MCHC: 35.1 g/dL (ref 31.5–36.0)
MCV: 100.3 fL (ref 79.5–101.0)
MONO#: 0.3 10*3/uL (ref 0.1–0.9)
MONO%: 8.8 % (ref 0.0–14.0)
NEUT#: 2.1 10*3/uL (ref 1.5–6.5)
NEUT%: 58.1 % (ref 38.4–76.8)
PLATELETS: 134 10*3/uL — AB (ref 145–400)
RBC: 3.07 10*6/uL — AB (ref 3.70–5.45)
RDW: 13.1 % (ref 11.2–14.5)
WBC: 3.6 10*3/uL — ABNORMAL LOW (ref 3.9–10.3)

## 2017-05-07 LAB — COMPREHENSIVE METABOLIC PANEL
ALBUMIN: 4 g/dL (ref 3.5–5.0)
ALT: 13 U/L (ref 0–55)
AST: 20 U/L (ref 5–34)
Alkaline Phosphatase: 37 U/L — ABNORMAL LOW (ref 40–150)
Anion Gap: 9 mEq/L (ref 3–11)
BILIRUBIN TOTAL: 1.55 mg/dL — AB (ref 0.20–1.20)
BUN: 17.6 mg/dL (ref 7.0–26.0)
CO2: 25 meq/L (ref 22–29)
Calcium: 9.3 mg/dL (ref 8.4–10.4)
Chloride: 95 mEq/L — ABNORMAL LOW (ref 98–109)
Creatinine: 1.1 mg/dL (ref 0.6–1.1)
EGFR: 42 mL/min/{1.73_m2} — AB (ref >60)
Glucose: 80 mg/dl (ref 70–140)
Potassium: 5 mEq/L (ref 3.5–5.1)
SODIUM: 129 meq/L — AB (ref 136–145)
TOTAL PROTEIN: 6.7 g/dL (ref 6.4–8.3)

## 2017-05-07 LAB — TSH: TSH: 3.713 m[IU]/L (ref 0.308–3.960)

## 2017-05-07 MED ORDER — DENOSUMAB 120 MG/1.7ML ~~LOC~~ SOLN
120.0000 mg | Freq: Once | SUBCUTANEOUS | Status: AC
  Administered 2017-05-07: 120 mg via SUBCUTANEOUS

## 2017-05-07 MED ORDER — FULVESTRANT 250 MG/5ML IM SOLN
500.0000 mg | Freq: Once | INTRAMUSCULAR | Status: AC
  Administered 2017-05-07: 500 mg via INTRAMUSCULAR

## 2017-05-07 MED ORDER — DENOSUMAB 120 MG/1.7ML ~~LOC~~ SOLN
SUBCUTANEOUS | Status: AC
  Filled 2017-05-07: qty 1.7

## 2017-05-07 MED ORDER — FULVESTRANT 250 MG/5ML IM SOLN
INTRAMUSCULAR | Status: AC
  Filled 2017-05-07: qty 10

## 2017-05-07 NOTE — Patient Instructions (Signed)
Fulvestrant injection What is this medicine? FULVESTRANT (ful VES trant) blocks the effects of estrogen. It is used to treat breast cancer. This medicine may be used for other purposes; ask your health care provider or pharmacist if you have questions. COMMON BRAND NAME(S): FASLODEX What should I tell my health care provider before I take this medicine? They need to know if you have any of these conditions: -bleeding problems -liver disease -low levels of platelets in the blood -an unusual or allergic reaction to fulvestrant, other medicines, foods, dyes, or preservatives -pregnant or trying to get pregnant -breast-feeding How should I use this medicine? This medicine is for injection into a muscle. It is usually given by a health care professional in a hospital or clinic setting. Talk to your pediatrician regarding the use of this medicine in children. Special care may be needed. Overdosage: If you think you have taken too much of this medicine contact a poison control center or emergency room at once. NOTE: This medicine is only for you. Do not share this medicine with others. What if I miss a dose? It is important not to miss your dose. Call your doctor or health care professional if you are unable to keep an appointment. What may interact with this medicine? -medicines that treat or prevent blood clots like warfarin, enoxaparin, and dalteparin This list may not describe all possible interactions. Give your health care provider a list of all the medicines, herbs, non-prescription drugs, or dietary supplements you use. Also tell them if you smoke, drink alcohol, or use illegal drugs. Some items may interact with your medicine. What should I watch for while using this medicine? Your condition will be monitored carefully while you are receiving this medicine. You will need important blood work done while you are taking this medicine. Do not become pregnant while taking this medicine or for  at least 1 year after stopping it. Women of child-bearing potential will need to have a negative pregnancy test before starting this medicine. Women should inform their doctor if they wish to become pregnant or think they might be pregnant. There is a potential for serious side effects to an unborn child. Men should inform their doctors if they wish to father a child. This medicine may lower sperm counts. Talk to your health care professional or pharmacist for more information. Do not breast-feed an infant while taking this medicine or for 1 year after the last dose. What side effects may I notice from receiving this medicine? Side effects that you should report to your doctor or health care professional as soon as possible: -allergic reactions like skin rash, itching or hives, swelling of the face, lips, or tongue -feeling faint or lightheaded, falls -pain, tingling, numbness, or weakness in the legs -signs and symptoms of infection like fever or chills; cough; flu-like symptoms; sore throat -vaginal bleeding Side effects that usually do not require medical attention (report to your doctor or health care professional if they continue or are bothersome): -aches, pains -constipation -diarrhea -headache -hot flashes -nausea, vomiting -pain at site where injected -stomach pain This list may not describe all possible side effects. Call your doctor for medical advice about side effects. You may report side effects to FDA at 1-800-FDA-1088. Where should I keep my medicine? This drug is given in a hospital or clinic and will not be stored at home. NOTE: This sheet is a summary. It may not cover all possible information. If you have questions about this medicine, talk to your   doctor, pharmacist, or health care provider.  2018 Elsevier/Gold Standard (2014-12-04 11:03:55) Denosumab injection What is this medicine? DENOSUMAB (den oh sue mab) slows bone breakdown. Prolia is used to treat osteoporosis in  women after menopause and in men. Delton See is used to treat a high calcium level due to cancer and to prevent bone fractures and other bone problems caused by multiple myeloma or cancer bone metastases. Delton See is also used to treat giant cell tumor of the bone. This medicine may be used for other purposes; ask your health care provider or pharmacist if you have questions. COMMON BRAND NAME(S): Prolia, XGEVA What should I tell my health care provider before I take this medicine? They need to know if you have any of these conditions: -dental disease -having surgery or tooth extraction -infection -kidney disease -low levels of calcium or Vitamin D in the blood -malnutrition -on hemodialysis -skin conditions or sensitivity -thyroid or parathyroid disease -an unusual reaction to denosumab, other medicines, foods, dyes, or preservatives -pregnant or trying to get pregnant -breast-feeding How should I use this medicine? This medicine is for injection under the skin. It is given by a health care professional in a hospital or clinic setting. If you are getting Prolia, a special MedGuide will be given to you by the pharmacist with each prescription and refill. Be sure to read this information carefully each time. For Prolia, talk to your pediatrician regarding the use of this medicine in children. Special care may be needed. For Delton See, talk to your pediatrician regarding the use of this medicine in children. While this drug may be prescribed for children as young as 13 years for selected conditions, precautions do apply. Overdosage: If you think you have taken too much of this medicine contact a poison control center or emergency room at once. NOTE: This medicine is only for you. Do not share this medicine with others. What if I miss a dose? It is important not to miss your dose. Call your doctor or health care professional if you are unable to keep an appointment. What may interact with this  medicine? Do not take this medicine with any of the following medications: -other medicines containing denosumab This medicine may also interact with the following medications: -medicines that lower your chance of fighting infection -steroid medicines like prednisone or cortisone This list may not describe all possible interactions. Give your health care provider a list of all the medicines, herbs, non-prescription drugs, or dietary supplements you use. Also tell them if you smoke, drink alcohol, or use illegal drugs. Some items may interact with your medicine. What should I watch for while using this medicine? Visit your doctor or health care professional for regular checks on your progress. Your doctor or health care professional may order blood tests and other tests to see how you are doing. Call your doctor or health care professional for advice if you get a fever, chills or sore throat, or other symptoms of a cold or flu. Do not treat yourself. This drug may decrease your body's ability to fight infection. Try to avoid being around people who are sick. You should make sure you get enough calcium and vitamin D while you are taking this medicine, unless your doctor tells you not to. Discuss the foods you eat and the vitamins you take with your health care professional. See your dentist regularly. Brush and floss your teeth as directed. Before you have any dental work done, tell your dentist you are receiving this medicine. Do  not become pregnant while taking this medicine or for 5 months after stopping it. Talk with your doctor or health care professional about your birth control options while taking this medicine. Women should inform their doctor if they wish to become pregnant or think they might be pregnant. There is a potential for serious side effects to an unborn child. Talk to your health care professional or pharmacist for more information. What side effects may I notice from receiving this  medicine? Side effects that you should report to your doctor or health care professional as soon as possible: -allergic reactions like skin rash, itching or hives, swelling of the face, lips, or tongue -bone pain -breathing problems -dizziness -jaw pain, especially after dental work -redness, blistering, peeling of the skin -signs and symptoms of infection like fever or chills; cough; sore throat; pain or trouble passing urine -signs of low calcium like fast heartbeat, muscle cramps or muscle pain; pain, tingling, numbness in the hands or feet; seizures -unusual bleeding or bruising -unusually weak or tired Side effects that usually do not require medical attention (report to your doctor or health care professional if they continue or are bothersome): -constipation -diarrhea -headache -joint pain -loss of appetite -muscle pain -runny nose -tiredness -upset stomach This list may not describe all possible side effects. Call your doctor for medical advice about side effects. You may report side effects to FDA at 1-800-FDA-1088. Where should I keep my medicine? This medicine is only given in a clinic, doctor's office, or other health care setting and will not be stored at home. NOTE: This sheet is a summary. It may not cover all possible information. If you have questions about this medicine, talk to your doctor, pharmacist, or health care provider.  2018 Elsevier/Gold Standard (2016-05-30 19:17:21)  

## 2017-05-07 NOTE — Telephone Encounter (Signed)
Gave patient avs and calendars with appts per 12/17 los.

## 2017-05-08 ENCOUNTER — Other Ambulatory Visit: Payer: Self-pay

## 2017-05-08 LAB — CANCER ANTIGEN 27.29: CAN 27.29: 179.4 U/mL — AB (ref 0.0–38.6)

## 2017-05-08 MED ORDER — KETOCONAZOLE 2 % EX SHAM: 1.0000 "application " | MEDICATED_SHAMPOO | Freq: Two times a day (BID) | CUTANEOUS | 0 refills | Status: DC | PRN

## 2017-05-08 NOTE — Telephone Encounter (Signed)
Spoke with [name], pt daughter, informed her script was sent in for cream for under pt's breast to her her pharmacy.  Cyndia Bent RN

## 2017-05-25 ENCOUNTER — Other Ambulatory Visit: Payer: Self-pay | Admitting: Oncology

## 2017-05-25 DIAGNOSIS — C50919 Malignant neoplasm of unspecified site of unspecified female breast: Secondary | ICD-10-CM

## 2017-06-01 ENCOUNTER — Telehealth: Payer: Self-pay | Admitting: Pharmacy Technician

## 2017-06-01 NOTE — Telephone Encounter (Signed)
Oral Oncology Patient Theresa Wallace to follow up on the status of the patient's application for assistance with Ibrance.   I was informed that since grant foundations were open, these resources had to be utilized first.    Theresa Wallace has been enrolled with [employer].  (information about this is in a separate encounter)  [employer] application has been placed on hold and filed in the event that it is needed later in the year.   Theresa Wallace. Theresa Wallace, [place] Patient Theresa Wallace [phone] 06/01/2017 4:47 PM

## 2017-06-01 NOTE — Telephone Encounter (Signed)
Oral Oncology Patient Advocate Encounter  Patient has been conditionally approved for copay assistance with [employer] (TAF).    Current enrollment expires on 07/01/2017.  The patient will receive an enrollment application in the mail to complete and return to TAF.  This application's submission is required for the patient's continued enrollment after 07/01/2017.  I have discussed this with the patient's daughter.  They are going to fill the application out in the office on 06/04/2017.  I will submit it for them.    When fully enrolled, The Assistance Fund will cover all copayment expenses for Ibrance for the remainder of the calendar year.    Leslee Home will now be filled at [facility].   The billing information is as follows and has been shared with Leonore.  Member ID: [id number] Group ID: [id number] PCN: [id number] BIN: [id number]  I will continue to follow up for full enrollment status.   Fabio Asa. Melynda Keller, [place] Patient [facility] [place] [phone] 06/01/2017 4:50 PM

## 2017-06-04 ENCOUNTER — Inpatient Hospital Stay: Payer: Medicare Other

## 2017-06-04 ENCOUNTER — Telehealth: Payer: Self-pay | Admitting: Adult Health

## 2017-06-04 ENCOUNTER — Inpatient Hospital Stay: Payer: Medicare Other | Attending: Adult Health | Admitting: Adult Health

## 2017-06-04 ENCOUNTER — Encounter: Payer: Self-pay | Admitting: Adult Health

## 2017-06-04 ENCOUNTER — Telehealth: Payer: Self-pay | Admitting: Pharmacist

## 2017-06-04 VITALS — BP 157/66 | HR 65 | Temp 98.4°F | Resp 16 | Ht 63.0 in | Wt 154.1 lb

## 2017-06-04 DIAGNOSIS — Z170 Estrogen receptor positive status [ER+]: Secondary | ICD-10-CM | POA: Diagnosis not present

## 2017-06-04 DIAGNOSIS — Z79899 Other long term (current) drug therapy: Secondary | ICD-10-CM | POA: Diagnosis not present

## 2017-06-04 DIAGNOSIS — Z87891 Personal history of nicotine dependence: Secondary | ICD-10-CM

## 2017-06-04 DIAGNOSIS — J410 Simple chronic bronchitis: Secondary | ICD-10-CM

## 2017-06-04 DIAGNOSIS — Z90710 Acquired absence of both cervix and uterus: Secondary | ICD-10-CM | POA: Diagnosis not present

## 2017-06-04 DIAGNOSIS — K219 Gastro-esophageal reflux disease without esophagitis: Secondary | ICD-10-CM

## 2017-06-04 DIAGNOSIS — Z8719 Personal history of other diseases of the digestive system: Secondary | ICD-10-CM | POA: Insufficient documentation

## 2017-06-04 DIAGNOSIS — Z808 Family history of malignant neoplasm of other organs or systems: Secondary | ICD-10-CM

## 2017-06-04 DIAGNOSIS — C50919 Malignant neoplasm of unspecified site of unspecified female breast: Secondary | ICD-10-CM

## 2017-06-04 DIAGNOSIS — R5383 Other fatigue: Secondary | ICD-10-CM

## 2017-06-04 DIAGNOSIS — C50912 Malignant neoplasm of unspecified site of left female breast: Secondary | ICD-10-CM

## 2017-06-04 DIAGNOSIS — R251 Tremor, unspecified: Secondary | ICD-10-CM | POA: Diagnosis not present

## 2017-06-04 DIAGNOSIS — E7800 Pure hypercholesterolemia, unspecified: Secondary | ICD-10-CM | POA: Diagnosis not present

## 2017-06-04 DIAGNOSIS — L299 Pruritus, unspecified: Secondary | ICD-10-CM | POA: Diagnosis not present

## 2017-06-04 DIAGNOSIS — C787 Secondary malignant neoplasm of liver and intrahepatic bile duct: Secondary | ICD-10-CM

## 2017-06-04 DIAGNOSIS — Z79818 Long term (current) use of other agents affecting estrogen receptors and estrogen levels: Secondary | ICD-10-CM

## 2017-06-04 DIAGNOSIS — Z9049 Acquired absence of other specified parts of digestive tract: Secondary | ICD-10-CM

## 2017-06-04 DIAGNOSIS — I10 Essential (primary) hypertension: Secondary | ICD-10-CM

## 2017-06-04 DIAGNOSIS — Z803 Family history of malignant neoplasm of breast: Secondary | ICD-10-CM | POA: Diagnosis not present

## 2017-06-04 DIAGNOSIS — K589 Irritable bowel syndrome without diarrhea: Secondary | ICD-10-CM

## 2017-06-04 DIAGNOSIS — C7800 Secondary malignant neoplasm of unspecified lung: Secondary | ICD-10-CM

## 2017-06-04 DIAGNOSIS — Z7982 Long term (current) use of aspirin: Secondary | ICD-10-CM | POA: Diagnosis not present

## 2017-06-04 DIAGNOSIS — C7951 Secondary malignant neoplasm of bone: Secondary | ICD-10-CM

## 2017-06-04 DIAGNOSIS — R05 Cough: Secondary | ICD-10-CM | POA: Diagnosis not present

## 2017-06-04 DIAGNOSIS — C50812 Malignant neoplasm of overlapping sites of left female breast: Secondary | ICD-10-CM

## 2017-06-04 DIAGNOSIS — C50922 Malignant neoplasm of unspecified site of left male breast: Secondary | ICD-10-CM

## 2017-06-04 LAB — COMPREHENSIVE METABOLIC PANEL
ALBUMIN: 3.9 g/dL (ref 3.5–5.0)
ALK PHOS: 37 U/L — AB (ref 40–150)
ALT: 16 U/L (ref 0–55)
ANION GAP: 8 (ref 3–11)
AST: 18 U/L (ref 5–34)
BILIRUBIN TOTAL: 0.8 mg/dL (ref 0.2–1.2)
BUN: 18 mg/dL (ref 7–26)
CO2: 26 mmol/L (ref 22–29)
Calcium: 9.1 mg/dL (ref 8.4–10.4)
Chloride: 101 mmol/L (ref 98–109)
Creatinine, Ser: 1.2 mg/dL — ABNORMAL HIGH (ref 0.60–1.10)
GFR calc Af Amer: 44 mL/min — ABNORMAL LOW (ref >60)
GFR calc non Af Amer: 38 mL/min — ABNORMAL LOW (ref >60)
GLUCOSE: 89 mg/dL (ref 70–140)
POTASSIUM: 5 mmol/L — AB (ref 3.3–4.7)
SODIUM: 135 mmol/L — AB (ref 136–145)
TOTAL PROTEIN: 6.8 g/dL (ref 6.4–8.3)

## 2017-06-04 LAB — CBC WITH DIFFERENTIAL/PLATELET
Basophils Absolute: 0.1 10*3/uL (ref 0.0–0.1)
Basophils Relative: 2 %
EOS PCT: 2 %
Eosinophils Absolute: 0.1 10*3/uL (ref 0.0–0.5)
HEMATOCRIT: 31.2 % — AB (ref 34.8–46.6)
Hemoglobin: 10.7 g/dL — ABNORMAL LOW (ref 11.6–15.9)
LYMPHS PCT: 34 %
Lymphs Abs: 1.1 10*3/uL (ref 0.9–3.3)
MCH: 35.3 pg — ABNORMAL HIGH (ref 25.1–34.0)
MCHC: 34.3 g/dL (ref 31.5–36.0)
MCV: 102.9 fL — AB (ref 79.5–101.0)
MONO ABS: 0.4 10*3/uL (ref 0.1–0.9)
MONOS PCT: 12 %
NEUTROS ABS: 1.6 10*3/uL (ref 1.5–6.5)
Neutrophils Relative %: 50 %
PLATELETS: 176 10*3/uL (ref 145–400)
RBC: 3.03 MIL/uL — ABNORMAL LOW (ref 3.70–5.45)
RDW: 13.5 % (ref 11.2–16.1)
WBC: 3.2 10*3/uL — ABNORMAL LOW (ref 3.9–10.3)

## 2017-06-04 MED ORDER — FULVESTRANT 250 MG/5ML IM SOLN
INTRAMUSCULAR | Status: AC
  Filled 2017-06-04: qty 5

## 2017-06-04 MED ORDER — DENOSUMAB 120 MG/1.7ML ~~LOC~~ SOLN
120.0000 mg | Freq: Once | SUBCUTANEOUS | Status: AC
  Administered 2017-06-04: 120 mg via SUBCUTANEOUS

## 2017-06-04 MED ORDER — CLOTRIMAZOLE-BETAMETHASONE 1-0.05 % EX CREA: 1.0000 "application " | TOPICAL_CREAM | Freq: Two times a day (BID) | CUTANEOUS | 0 refills | Status: DC

## 2017-06-04 MED ORDER — FULVESTRANT 250 MG/5ML IM SOLN
500.0000 mg | Freq: Once | INTRAMUSCULAR | Status: AC
  Administered 2017-06-04: 500 mg via INTRAMUSCULAR

## 2017-06-04 MED ORDER — PALBOCICLIB 100 MG PO CAPS: 100.0000 mg | ORAL_CAPSULE | Freq: Every day | ORAL | 6 refills | Status: DC

## 2017-06-04 NOTE — Progress Notes (Signed)
ID: [name]   DOB: [birth year]  MR#: [id number]  CHY#:[id number]  PCP: [name], MD GYN: SU:  OTHER MD:  CHIEF COMPLAINT:  Metastatic Breast Cancer  CURRENT TREATMENT: Exemestane, fulvestrant, palbociclib, Delton See   INTERVAL HISTORY: Theresa Wallace returns today for follow-up and treatment of her estrogen receptor positive breast cancer, accompanied by her daughter. She continues on exemestane, with good tolerance.   She continues on fulvestrant with a dose due today. She tolerates this well.   She receives monthly Xgeva. She also tolerates this well.   She is also on palbociclib, at 100 mg daily, 21 days on, 7 days off.   Theresa Wallace is tolerating her treatment well.  She is fatigued, but denies any new issues related to the cancer or treatment such as pain, or increased fatigue.  REVIEW OF SYSTEMS: Theresa Wallace has a virus.  This started about a week ago.  She still has a cough, but it is slowly getting better.  She has seen her PCP about this.  She denies any new issues.  She did have a rash on her breast that has improved with the Ketoconazole shampoo Dr. Jana Hakim prescribed, however it has remained very itchy in that one area.  She wants something to help with the intense itching.  She denies any other issues and a detailed ROS is otherwise non contributory.    HISTORY OF PRESENT ILLNESS: From the prior summary:  Theresa Wallace is an 84 y.o.  [place] woman I saw briefly when she moved to this area in 2007.  She had had a left breast cancer removed at [facility] in [place] in [phone] ([place]).  It was grade 2, measured 1 cm maximally and was strongly estrogen and progesterone-receptor positive.  HER-2 was not checked.  This was T1b NX invasive ductal carcinoma, stage I, and she was treated after radiation with anastrozole for several years, eventually switching to Evista primarily for cost reasons.   More recently, she presented to Dr. Inda Merlin with fatigue and a dry cough.   He auscultated dullness to percussion at the left base and obtained a chest x-ray which confirmed the presence of a left pleural effusion.   Dr. Inda Merlin scheduled [name] for an ultrasound-guided left thoracentesis, and this was performed November 09, 2010.  Approximately 1 L of amber-colored fluid was removed.  Cytology from this procedure ([id number]) showed only reactive mesothelial cells.   The patient was then referred here and set up for a CT of the chest and a PET scan.  The CT of the chest on June 20th showed 2 slightly enlarged left axillary lymph nodes with some irregular contours but more importantly multiple pleural nodules in the left chest measuring up to 4.6 cm.  The right chest was normal.  There was no pericardial effusion.  There was only a small to moderate left pleural effusion remaining, and aside from left lower lobe collapse or consolidation, there was no evidence of lung parenchymal involvement.  There was no evidence of bone involvement and also no calcified pleural plaques and no worrisome bone involvement.  PET scan on June 27th showed the nodularity along the left pleura to be intensely hypermetabolic.  For example the large lobe nodule measuring 4.5 cm had an SUV max of 12.9.  There was no hypermetabolic mediastinal nodal activity.  The to left axillary lymph nodes were very mildly hypermetabolic.  The left breast was clear.  There was 1 hypermetabolic lesion in the liver measuring 3.2 cm.  On June 27th, Dr. [name] performed fine-needle aspiration of a 7 mm abnormal-appearing lymph node in the lower left axilla.  The cytology from this procedure, however, ([name]) also was inconclusive showing no evidence of nodal tissue, mostly blood and fatty tissue.   Her subsequent history is as detailed below   PAST MEDICAL HISTORY: Past Medical History:  Diagnosis Date  . Breast cancer metastasized to multiple sites ([place]) 06/07/2011  . Cancer ([place])    breast ca  . Hypertension    1. Hypercholesterolemia. 2. Benign tremor. 3. Sigmoid diverticulosis. 4. Onychomycosis. 5. Irritable bowel syndrome. 6. GERD. 7. History of palpitations with Holter monitor in 2008 showing PACs. 8. History of hysterectomy with no salpingo-oophorectomy. 9. History of cholecystectomy. 10. History of bilateral knee arthroscopic surgery. History of hemorrhoid surgery.  FAMILY HISTORY [name] died in an automobile accident.  [name] died with "bone cancer."  The patient had 3 sisters.  One had breast cancer develop in her 75s.  There is also a cousin with breast cancer but no history of ovarian cancer or other history of cancer in first-degree relatives.  GYNECOLOGIC HISTORY: She is GX, P4, first pregnancy to term at age 84.  She was on the hormone replacement after her hysterectomy but only briefly and stopped when she had the initial diagnosis of breast cancer in 1996.  SOCIAL HISTORY: (Updated May 2018) She worked as a Network engineer.  She has been widowed since 2005, her husband [name] dying after a long illness involving strokes and a myocardial infarction.  Her significant other [name] who is a retired Chief Financial Officer is now suffering from dementia and is in a skilled nursing facility. The patient lives with her daughter [name] who used to be a Network engineer for Dr. Inda Merlin, and [name]'s husband, [name], who is a physician's assistant with Dr. Durward Fortes.  The patient has 12 grandchildren and 5 great-grandchildren. Incidentally [name] has a son in [place].   ADVANCED DIRECTIVES: In place  HEALTH MAINTENANCE: (Updated 02/26/2013) Social History   Tobacco Use  . Smoking status: Former Smoker    Last attempt to quit: 05/22/1968    Years since quitting: 49.0  . Smokeless tobacco: Never Used  Substance Use Topics  . Alcohol use: Yes  . Drug use: No     Colonoscopy:  PAP: s/p remote hysterectomy  Bone density:  10/23/2011, normal  Lipid panel: Dr. Inda Merlin,  UTD   Allergies  Allergen Reactions  . Ciprofloxacin   . Epinephrine   . Tamoxifen   . Mupirocin Rash    Current Outpatient Medications  Medication Sig Dispense Refill  . aspirin 81 MG tablet Take 81 mg by mouth daily.    . Calcium Carbonate-Vitamin D (CALCIUM-VITAMIN D) 500-200 MG-UNIT per tablet Take 1 tablet by mouth 2 (two) times daily with a meal.    . cholecalciferol (VITAMIN D) 400 UNITS TABS Take 2,000 Units by mouth daily.    Marland Kitchen exemestane (AROMASIN) 25 MG tablet Take 1 tablet (25 mg total) by mouth daily. 90 tablet 3  . exemestane (AROMASIN) 25 MG tablet take 1 tablet by mouth once daily 90 tablet 1  . Glucosamine HCl-MSM (GLUCOSAMINE-MSM PO) Take 1 tablet by mouth daily.    Marland Kitchen ketoconazole (NIZORAL) 2 % shampoo Apply 1 application topically 2 (two) times daily as needed for irritation. 120 mL 0  . levothyroxine (SYNTHROID, LEVOTHROID) 25 MCG tablet Take 25 mcg by mouth daily.    Marland Kitchen losartan (COZAAR) 50 MG tablet     .  metroNIDAZOLE (METROGEL) 0.75 % gel Apply topically 2 (two) times daily. For rosacea    . Multiple Vitamin (MULTIVITAMIN) tablet Take 1 tablet by mouth daily.    . palbociclib (IBRANCE) 100 MG capsule Take 1 capsule (100 mg total) by mouth daily with breakfast. Take whole with food. 21 capsule 6  . propranolol ER (INDERAL LA) 60 MG 24 hr capsule     . clotrimazole-betamethasone (LOTRISONE) cream Apply 1 application topically 2 (two) times daily. 30 g 0   No current facility-administered medications for this visit.     OBJECTIVE:   Vitals:   06/04/17 1441  BP: (!) 157/66  Pulse: 65  Resp: 16  Temp: 98.4 F (36.9 C)  SpO2: 100%   Body mass index is 27.3 kg/m.  Filed Weights   06/04/17 1441  Weight: 154 lb 1.6 oz (69.9 kg)   GENERAL: Patient is a well appearing older female in no acute distress HEENT:  Sclerae anicteric.  Oropharynx clear and moist. No ulcerations or evidence of oropharyngeal candidiasis. Neck is supple.  NODES:  No cervical,  supraclavicular, or axillary lymphadenopathy palpated.  BREAST EXAM:  Deferred. LUNGS:  Clear to auscultation bilaterally.  No wheezes or rhonchi. HEART:  Regular rate and rhythm. No murmur appreciated. ABDOMEN:  Soft, nontender.  Positive, normoactive bowel sounds. No organomegaly palpated. MSK:  No focal spinal tenderness to palpation. Full range of motion bilaterally in the upper extremities. EXTREMITIES:  No peripheral edema.   SKIN:  Clear with no obvious rashes or skin changes. No nail dyscrasia. NEURO:  Nonfocal. Well oriented.  Appropriate affect.     LAB RESULTS: Lab Results  Component Value Date   WBC 3.2 (L) 06/04/2017   NEUTROABS 1.6 06/04/2017   HGB 10.7 (L) 06/04/2017   HCT 31.2 (L) 06/04/2017   MCV 102.9 (H) 06/04/2017   PLT 176 06/04/2017      Chemistry      Component Value Date/Time   NA 135 (L) 06/04/2017 1414   NA 129 (L) 05/07/2017 1045   K 5.0 (H) 06/04/2017 1414   K 5.0 05/07/2017 1045   CL 101 06/04/2017 1414   CL 104 10/24/2012 1014   CO2 26 06/04/2017 1414   CO2 25 05/07/2017 1045   BUN 18 06/04/2017 1414   BUN 17.6 05/07/2017 1045   CREATININE 1.20 (H) 06/04/2017 1414   CREATININE 1.1 05/07/2017 1045      Component Value Date/Time   CALCIUM 9.1 06/04/2017 1414   CALCIUM 9.3 05/07/2017 1045   ALKPHOS 37 (L) 06/04/2017 1414   ALKPHOS 37 (L) 05/07/2017 1045   AST 18 06/04/2017 1414   AST 20 05/07/2017 1045   ALT 16 06/04/2017 1414   ALT 13 05/07/2017 1045   BILITOT 0.8 06/04/2017 1414   BILITOT 1.55 (H) 05/07/2017 1045      IMAGING STUDIES: CA-27-29 results reviewed.  As of November there continues to be a downward trend which is favorable  ASSESSMENT: (1) Status post left lumpectomy in 1996 for a T1b N0 grade 2 invasive ductal carcinoma which was strongly estrogen and progesterone receptor positive, treated with radiation, then anastrozole, then Evista, all treatment discontinued July 2012  METASTATIC DISEASE: July 2012 (2)  recurrence to the lining of the left lung and liver documented by liver biopsy July 2012, showing metastatic adenocarcinoma estrogen and progesterone receptor positive at 96% and 97% respectively; there was no Her-2 amplification.   (3)  She has been on exemestane since early July of 2012, with chest CT  scan April 2013 showing complete resolution of her lung and liver metastases  (a) bone density in June of 08/09/2011 was normal  (b) bone density 03/20/2016 was normal with a T score of -0.6  (c) exemestane continued at the time of disease progression noted May 2018  (4) MRI of the lumbar spine 09-2012 showed significant degenerative disease but no evidence of metastatic disease to bone.  (5) disease progression noted May 2018: CT scan shows liver, lung, and bone lesions  (6) fulvestrant started 09/26/2016, palbociclib added 10/24/2016 at 125 mg daily, 21 days on. 7 days off  (a) dose decreased to 175m daily on 02/14/2017  (7) denosumab/Xgeva started 11/21/2016, repeated every 28 days  PLAN:  RGlenisis doing well today.  She will proceed with her injections today with fulvestrant and Xgeva.  She will continue Palbociclib 1052m21 days on 7 days off.  I reviewed her cbc with her and her daughter today which is stable and her anc is 1.6.  I also reviewed her CMET with her and her daughter in detail.  This also remains stable, particularly her sodium of 135 and kidney function of 1.2.  She and her daughter verbalize understanding.  They also plan to get some tums today for her to take tonight after her xgeva injection.  We reviewed the need for this on injection days.  Theresa Wallace has appt for lab, f/u with me, and injection in one month.  Per Dr. MaVirgie Dadast note: "She will be treated today and monthly.  She will see usKoreaonthly at least for the next 3 months to make sure her Ibrance dose does not need to be adjusted.  We are planning restaging studies perhaps 6 months from now."  Patient and her daughter  understand this plan, meaning scans would be due in May, 2019.  She knows to call for any other issues that may develop before the next visit.  A total of (30) minutes of face-to-face time was spent with this patient with greater than 50% of that time in counseling and care-coordination.   LiWilber BihariNP 06/04/17 3:43 PM Medical Oncology and Hematology CoThe [facility] [id number]. [phone]  Fax. [phone]

## 2017-06-04 NOTE — Telephone Encounter (Signed)
No 1/14 los

## 2017-06-04 NOTE — Telephone Encounter (Signed)
Oral Chemotherapy Pharmacist Encounter  Follow-Up Form  Spoke with patient and daughter today to follow up regarding patient's oral chemotherapy medication: Ibrance (palbociclib) for the treatment of metastatic, hormone receptor positive breast cancer in conjunction with Faslodex, planned duration until disease progression or unacceptable toxicity.  Original Start date of oral chemotherapy: 10/24/16  Pt is doing well today  Pt reports 0 doses of Ibrance 100mg  capsules, 1 capsule by mouth once daily, taken with breakfast, for 21 days on, 7 days off, missed in the last month.  Patient restarts her cycle on Wednesdays.  Pt reports the following side effects: mild diarrhea, controlled with loperamide, manageable  Pertinent labs reviewed: OK for continued treatment.  Other Issues: Patient with copayment grant from [employer]. Will receive Ibrance refills at the [facility] for 2019. New prescription sent.  Patient knows to call the office with questions or concerns. Oral Oncology Clinic will continue to follow.  Thank you,  Johny Drilling, PharmD, BCPS, BCOP 06/04/2017 4:21 PM Oral Oncology Clinic [phone]

## 2017-06-04 NOTE — Patient Instructions (Signed)
Fulvestrant injection What is this medicine? FULVESTRANT (ful VES trant) blocks the effects of estrogen. It is used to treat breast cancer. This medicine may be used for other purposes; ask your health care provider or pharmacist if you have questions. COMMON BRAND NAME(S): FASLODEX What should I tell my health care provider before I take this medicine? They need to know if you have any of these conditions: -bleeding problems -liver disease -low levels of platelets in the blood -an unusual or allergic reaction to fulvestrant, other medicines, foods, dyes, or preservatives -pregnant or trying to get pregnant -breast-feeding How should I use this medicine? This medicine is for injection into a muscle. It is usually given by a health care professional in a hospital or clinic setting. Talk to your pediatrician regarding the use of this medicine in children. Special care may be needed. Overdosage: If you think you have taken too much of this medicine contact a poison control center or emergency room at once. NOTE: This medicine is only for you. Do not share this medicine with others. What if I miss a dose? It is important not to miss your dose. Call your doctor or health care professional if you are unable to keep an appointment. What may interact with this medicine? -medicines that treat or prevent blood clots like warfarin, enoxaparin, and dalteparin This list may not describe all possible interactions. Give your health care provider a list of all the medicines, herbs, non-prescription drugs, or dietary supplements you use. Also tell them if you smoke, drink alcohol, or use illegal drugs. Some items may interact with your medicine. What should I watch for while using this medicine? Your condition will be monitored carefully while you are receiving this medicine. You will need important blood work done while you are taking this medicine. Do not become pregnant while taking this medicine or for  at least 1 year after stopping it. Women of child-bearing potential will need to have a negative pregnancy test before starting this medicine. Women should inform their doctor if they wish to become pregnant or think they might be pregnant. There is a potential for serious side effects to an unborn child. Men should inform their doctors if they wish to father a child. This medicine may lower sperm counts. Talk to your health care professional or pharmacist for more information. Do not breast-feed an infant while taking this medicine or for 1 year after the last dose. What side effects may I notice from receiving this medicine? Side effects that you should report to your doctor or health care professional as soon as possible: -allergic reactions like skin rash, itching or hives, swelling of the face, lips, or tongue -feeling faint or lightheaded, falls -pain, tingling, numbness, or weakness in the legs -signs and symptoms of infection like fever or chills; cough; flu-like symptoms; sore throat -vaginal bleeding Side effects that usually do not require medical attention (report to your doctor or health care professional if they continue or are bothersome): -aches, pains -constipation -diarrhea -headache -hot flashes -nausea, vomiting -pain at site where injected -stomach pain This list may not describe all possible side effects. Call your doctor for medical advice about side effects. You may report side effects to FDA at 1-800-FDA-1088. Where should I keep my medicine? This drug is given in a hospital or clinic and will not be stored at home. NOTE: This sheet is a summary. It may not cover all possible information. If you have questions about this medicine, talk to your   doctor, pharmacist, or health care provider.  2018 Elsevier/Gold Standard (2014-12-04 11:03:55) Denosumab injection What is this medicine? DENOSUMAB (den oh sue mab) slows bone breakdown. Prolia is used to treat osteoporosis in  women after menopause and in men. Delton See is used to treat a high calcium level due to cancer and to prevent bone fractures and other bone problems caused by multiple myeloma or cancer bone metastases. Delton See is also used to treat giant cell tumor of the bone. This medicine may be used for other purposes; ask your health care provider or pharmacist if you have questions. COMMON BRAND NAME(S): Prolia, XGEVA What should I tell my health care provider before I take this medicine? They need to know if you have any of these conditions: -dental disease -having surgery or tooth extraction -infection -kidney disease -low levels of calcium or Vitamin D in the blood -malnutrition -on hemodialysis -skin conditions or sensitivity -thyroid or parathyroid disease -an unusual reaction to denosumab, other medicines, foods, dyes, or preservatives -pregnant or trying to get pregnant -breast-feeding How should I use this medicine? This medicine is for injection under the skin. It is given by a health care professional in a hospital or clinic setting. If you are getting Prolia, a special MedGuide will be given to you by the pharmacist with each prescription and refill. Be sure to read this information carefully each time. For Prolia, talk to your pediatrician regarding the use of this medicine in children. Special care may be needed. For Delton See, talk to your pediatrician regarding the use of this medicine in children. While this drug may be prescribed for children as young as 13 years for selected conditions, precautions do apply. Overdosage: If you think you have taken too much of this medicine contact a poison control center or emergency room at once. NOTE: This medicine is only for you. Do not share this medicine with others. What if I miss a dose? It is important not to miss your dose. Call your doctor or health care professional if you are unable to keep an appointment. What may interact with this  medicine? Do not take this medicine with any of the following medications: -other medicines containing denosumab This medicine may also interact with the following medications: -medicines that lower your chance of fighting infection -steroid medicines like prednisone or cortisone This list may not describe all possible interactions. Give your health care provider a list of all the medicines, herbs, non-prescription drugs, or dietary supplements you use. Also tell them if you smoke, drink alcohol, or use illegal drugs. Some items may interact with your medicine. What should I watch for while using this medicine? Visit your doctor or health care professional for regular checks on your progress. Your doctor or health care professional may order blood tests and other tests to see how you are doing. Call your doctor or health care professional for advice if you get a fever, chills or sore throat, or other symptoms of a cold or flu. Do not treat yourself. This drug may decrease your body's ability to fight infection. Try to avoid being around people who are sick. You should make sure you get enough calcium and vitamin D while you are taking this medicine, unless your doctor tells you not to. Discuss the foods you eat and the vitamins you take with your health care professional. See your dentist regularly. Brush and floss your teeth as directed. Before you have any dental work done, tell your dentist you are receiving this medicine. Do  not become pregnant while taking this medicine or for 5 months after stopping it. Talk with your doctor or health care professional about your birth control options while taking this medicine. Women should inform their doctor if they wish to become pregnant or think they might be pregnant. There is a potential for serious side effects to an unborn child. Talk to your health care professional or pharmacist for more information. What side effects may I notice from receiving this  medicine? Side effects that you should report to your doctor or health care professional as soon as possible: -allergic reactions like skin rash, itching or hives, swelling of the face, lips, or tongue -bone pain -breathing problems -dizziness -jaw pain, especially after dental work -redness, blistering, peeling of the skin -signs and symptoms of infection like fever or chills; cough; sore throat; pain or trouble passing urine -signs of low calcium like fast heartbeat, muscle cramps or muscle pain; pain, tingling, numbness in the hands or feet; seizures -unusual bleeding or bruising -unusually weak or tired Side effects that usually do not require medical attention (report to your doctor or health care professional if they continue or are bothersome): -constipation -diarrhea -headache -joint pain -loss of appetite -muscle pain -runny nose -tiredness -upset stomach This list may not describe all possible side effects. Call your doctor for medical advice about side effects. You may report side effects to FDA at 1-800-FDA-1088. Where should I keep my medicine? This medicine is only given in a clinic, doctor's office, or other health care setting and will not be stored at home. NOTE: This sheet is a summary. It may not cover all possible information. If you have questions about this medicine, talk to your doctor, pharmacist, or health care provider.  2018 Elsevier/Gold Standard (2016-05-30 19:17:21)  

## 2017-06-05 LAB — CANCER ANTIGEN 27.29: CA 27.29: 160.4 U/mL — ABNORMAL HIGH (ref 0.0–38.6)

## 2017-06-12 ENCOUNTER — Other Ambulatory Visit: Payer: Self-pay | Admitting: Nurse Practitioner

## 2017-06-12 ENCOUNTER — Ambulatory Visit
Admission: RE | Admit: 2017-06-12 | Discharge: 2017-06-12 | Disposition: A | Discharge status: Home / Self Care | Payer: Medicare Other | Source: Ambulatory Visit | Attending: Nurse Practitioner | Admitting: Nurse Practitioner

## 2017-06-12 DIAGNOSIS — M25551 Pain in right hip: Secondary | ICD-10-CM

## 2017-06-12 DIAGNOSIS — G44319 Acute post-traumatic headache, not intractable: Secondary | ICD-10-CM

## 2017-06-12 DIAGNOSIS — W19XXXA Unspecified fall, initial encounter: Secondary | ICD-10-CM

## 2017-06-14 NOTE — Telephone Encounter (Signed)
Oral Oncology Patient Advocate Encounter  Confirmed with [employer] that the patient has been fully enrolled in their copay assistance program until 05/21/2018.  [employer] will cover all of the patient's copay expenses for Ibrance until this date.    I have informed the patient's daughter of this approval.    Fabio Asa. Melynda Keller, [place] Patient [place] [phone] 06/14/2017 1:53 PM

## 2017-06-15 MED FILL — IBRANCE 100 MG CAPSULE: 100 | 28 days supply | Qty: 21 | Fill #0

## 2017-07-02 ENCOUNTER — Inpatient Hospital Stay: Payer: Medicare Other | Attending: Adult Health | Admitting: Adult Health

## 2017-07-02 ENCOUNTER — Inpatient Hospital Stay: Payer: Medicare Other

## 2017-07-02 ENCOUNTER — Telehealth: Payer: Self-pay | Admitting: Oncology

## 2017-07-02 ENCOUNTER — Encounter: Payer: Self-pay | Admitting: Adult Health

## 2017-07-02 VITALS — BP 158/78 | HR 69 | Temp 97.8°F | Resp 18 | Ht 63.0 in | Wt 159.3 lb

## 2017-07-02 DIAGNOSIS — Z79811 Long term (current) use of aromatase inhibitors: Secondary | ICD-10-CM | POA: Diagnosis not present

## 2017-07-02 DIAGNOSIS — C50912 Malignant neoplasm of unspecified site of left female breast: Secondary | ICD-10-CM

## 2017-07-02 DIAGNOSIS — Z90710 Acquired absence of both cervix and uterus: Secondary | ICD-10-CM | POA: Diagnosis not present

## 2017-07-02 DIAGNOSIS — Z79818 Long term (current) use of other agents affecting estrogen receptors and estrogen levels: Secondary | ICD-10-CM | POA: Diagnosis not present

## 2017-07-02 DIAGNOSIS — C50922 Malignant neoplasm of unspecified site of left male breast: Secondary | ICD-10-CM

## 2017-07-02 DIAGNOSIS — Z87891 Personal history of nicotine dependence: Secondary | ICD-10-CM | POA: Insufficient documentation

## 2017-07-02 DIAGNOSIS — Z8719 Personal history of other diseases of the digestive system: Secondary | ICD-10-CM | POA: Insufficient documentation

## 2017-07-02 DIAGNOSIS — Z809 Family history of malignant neoplasm, unspecified: Secondary | ICD-10-CM | POA: Insufficient documentation

## 2017-07-02 DIAGNOSIS — C50812 Malignant neoplasm of overlapping sites of left female breast: Secondary | ICD-10-CM

## 2017-07-02 DIAGNOSIS — Z79899 Other long term (current) drug therapy: Secondary | ICD-10-CM | POA: Diagnosis not present

## 2017-07-02 DIAGNOSIS — K219 Gastro-esophageal reflux disease without esophagitis: Secondary | ICD-10-CM | POA: Diagnosis not present

## 2017-07-02 DIAGNOSIS — I10 Essential (primary) hypertension: Secondary | ICD-10-CM | POA: Insufficient documentation

## 2017-07-02 DIAGNOSIS — E875 Hyperkalemia: Secondary | ICD-10-CM

## 2017-07-02 DIAGNOSIS — C7802 Secondary malignant neoplasm of left lung: Secondary | ICD-10-CM | POA: Insufficient documentation

## 2017-07-02 DIAGNOSIS — R251 Tremor, unspecified: Secondary | ICD-10-CM | POA: Diagnosis not present

## 2017-07-02 DIAGNOSIS — C787 Secondary malignant neoplasm of liver and intrahepatic bile duct: Secondary | ICD-10-CM | POA: Diagnosis not present

## 2017-07-02 DIAGNOSIS — C7951 Secondary malignant neoplasm of bone: Secondary | ICD-10-CM | POA: Insufficient documentation

## 2017-07-02 DIAGNOSIS — J410 Simple chronic bronchitis: Secondary | ICD-10-CM

## 2017-07-02 DIAGNOSIS — R21 Rash and other nonspecific skin eruption: Secondary | ICD-10-CM | POA: Diagnosis not present

## 2017-07-02 DIAGNOSIS — Z803 Family history of malignant neoplasm of breast: Secondary | ICD-10-CM

## 2017-07-02 DIAGNOSIS — Z170 Estrogen receptor positive status [ER+]: Principal | ICD-10-CM

## 2017-07-02 DIAGNOSIS — R5383 Other fatigue: Secondary | ICD-10-CM | POA: Diagnosis not present

## 2017-07-02 DIAGNOSIS — K589 Irritable bowel syndrome without diarrhea: Secondary | ICD-10-CM | POA: Diagnosis not present

## 2017-07-02 DIAGNOSIS — Z9049 Acquired absence of other specified parts of digestive tract: Secondary | ICD-10-CM | POA: Insufficient documentation

## 2017-07-02 DIAGNOSIS — E7800 Pure hypercholesterolemia, unspecified: Secondary | ICD-10-CM | POA: Insufficient documentation

## 2017-07-02 DIAGNOSIS — C50919 Malignant neoplasm of unspecified site of unspecified female breast: Secondary | ICD-10-CM

## 2017-07-02 LAB — COMPREHENSIVE METABOLIC PANEL
ALBUMIN: 3.9 g/dL (ref 3.5–5.0)
ALK PHOS: 36 U/L — AB (ref 40–150)
ALT: 13 U/L (ref 0–55)
AST: 19 U/L (ref 5–34)
Anion gap: 6 (ref 3–11)
BUN: 26 mg/dL (ref 7–26)
CALCIUM: 9.9 mg/dL (ref 8.4–10.4)
CO2: 28 mmol/L (ref 22–29)
CREATININE: 1.23 mg/dL — AB (ref 0.60–1.10)
Chloride: 102 mmol/L (ref 98–109)
GFR calc Af Amer: 43 mL/min — ABNORMAL LOW (ref >60)
GFR calc non Af Amer: 37 mL/min — ABNORMAL LOW (ref >60)
GLUCOSE: 69 mg/dL — AB (ref 70–140)
Potassium: 5.6 mmol/L — ABNORMAL HIGH (ref 3.5–5.1)
SODIUM: 136 mmol/L (ref 136–145)
Total Bilirubin: 0.8 mg/dL (ref 0.2–1.2)
Total Protein: 6.6 g/dL (ref 6.4–8.3)

## 2017-07-02 LAB — CBC WITH DIFFERENTIAL/PLATELET
BASOS ABS: 0 10*3/uL (ref 0.0–0.1)
Basophils Relative: 1 %
EOS ABS: 0.1 10*3/uL (ref 0.0–0.5)
EOS PCT: 2 %
HCT: 32.4 % — ABNORMAL LOW (ref 34.8–46.6)
Hemoglobin: 10.9 g/dL — ABNORMAL LOW (ref 11.6–15.9)
Lymphocytes Relative: 33 %
Lymphs Abs: 1.2 10*3/uL (ref 0.9–3.3)
MCH: 34.5 pg — AB (ref 25.1–34.0)
MCHC: 33.6 g/dL (ref 31.5–36.0)
MCV: 102.6 fL — AB (ref 79.5–101.0)
MONO ABS: 0.5 10*3/uL (ref 0.1–0.9)
Monocytes Relative: 12 %
Neutro Abs: 1.9 10*3/uL (ref 1.5–6.5)
Neutrophils Relative %: 52 %
PLATELETS: 139 10*3/uL — AB (ref 145–400)
RBC: 3.15 MIL/uL — AB (ref 3.70–5.45)
RDW: 13.7 % (ref 11.2–14.5)
WBC: 3.7 10*3/uL — ABNORMAL LOW (ref 3.9–10.3)

## 2017-07-02 MED ORDER — FULVESTRANT 250 MG/5ML IM SOLN
500.0000 mg | Freq: Once | INTRAMUSCULAR | Status: AC
  Administered 2017-07-02: 500 mg via INTRAMUSCULAR

## 2017-07-02 MED ORDER — HYDROCHLOROTHIAZIDE 12.5 MG PO TABS: 12.5000 mg | ORAL_TABLET | Freq: Every day | ORAL | 2 refills | Status: DC

## 2017-07-02 MED ORDER — DENOSUMAB 120 MG/1.7ML ~~LOC~~ SOLN
SUBCUTANEOUS | Status: AC
  Filled 2017-07-02: qty 1.7

## 2017-07-02 MED ORDER — DENOSUMAB 120 MG/1.7ML ~~LOC~~ SOLN
120.0000 mg | Freq: Once | SUBCUTANEOUS | Status: AC
  Administered 2017-07-02: 120 mg via SUBCUTANEOUS

## 2017-07-02 NOTE — Patient Instructions (Signed)
Fulvestrant injection What is this medicine? FULVESTRANT (ful VES trant) blocks the effects of estrogen. It is used to treat breast cancer. This medicine may be used for other purposes; ask your health care provider or pharmacist if you have questions. COMMON BRAND NAME(S): FASLODEX What should I tell my health care provider before I take this medicine? They need to know if you have any of these conditions: -bleeding problems -liver disease -low levels of platelets in the blood -an unusual or allergic reaction to fulvestrant, other medicines, foods, dyes, or preservatives -pregnant or trying to get pregnant -breast-feeding How should I use this medicine? This medicine is for injection into a muscle. It is usually given by a health care professional in a hospital or clinic setting. Talk to your pediatrician regarding the use of this medicine in children. Special care may be needed. Overdosage: If you think you have taken too much of this medicine contact a poison control center or emergency room at once. NOTE: This medicine is only for you. Do not share this medicine with others. What if I miss a dose? It is important not to miss your dose. Call your doctor or health care professional if you are unable to keep an appointment. What may interact with this medicine? -medicines that treat or prevent blood clots like warfarin, enoxaparin, and dalteparin This list may not describe all possible interactions. Give your health care provider a list of all the medicines, herbs, non-prescription drugs, or dietary supplements you use. Also tell them if you smoke, drink alcohol, or use illegal drugs. Some items may interact with your medicine. What should I watch for while using this medicine? Your condition will be monitored carefully while you are receiving this medicine. You will need important blood work done while you are taking this medicine. Do not become pregnant while taking this medicine or for  at least 1 year after stopping it. Women of child-bearing potential will need to have a negative pregnancy test before starting this medicine. Women should inform their doctor if they wish to become pregnant or think they might be pregnant. There is a potential for serious side effects to an unborn child. Men should inform their doctors if they wish to father a child. This medicine may lower sperm counts. Talk to your health care professional or pharmacist for more information. Do not breast-feed an infant while taking this medicine or for 1 year after the last dose. What side effects may I notice from receiving this medicine? Side effects that you should report to your doctor or health care professional as soon as possible: -allergic reactions like skin rash, itching or hives, swelling of the face, lips, or tongue -feeling faint or lightheaded, falls -pain, tingling, numbness, or weakness in the legs -signs and symptoms of infection like fever or chills; cough; flu-like symptoms; sore throat -vaginal bleeding Side effects that usually do not require medical attention (report to your doctor or health care professional if they continue or are bothersome): -aches, pains -constipation -diarrhea -headache -hot flashes -nausea, vomiting -pain at site where injected -stomach pain This list may not describe all possible side effects. Call your doctor for medical advice about side effects. You may report side effects to FDA at 1-800-FDA-1088. Where should I keep my medicine? This drug is given in a hospital or clinic and will not be stored at home. NOTE: This sheet is a summary. It may not cover all possible information. If you have questions about this medicine, talk to your   doctor, pharmacist, or health care provider.  2018 Elsevier/Gold Standard (2014-12-04 11:03:55) Denosumab injection What is this medicine? DENOSUMAB (den oh sue mab) slows bone breakdown. Prolia is used to treat osteoporosis in  women after menopause and in men. Delton See is used to treat a high calcium level due to cancer and to prevent bone fractures and other bone problems caused by multiple myeloma or cancer bone metastases. Delton See is also used to treat giant cell tumor of the bone. This medicine may be used for other purposes; ask your health care provider or pharmacist if you have questions. COMMON BRAND NAME(S): Prolia, XGEVA What should I tell my health care provider before I take this medicine? They need to know if you have any of these conditions: -dental disease -having surgery or tooth extraction -infection -kidney disease -low levels of calcium or Vitamin D in the blood -malnutrition -on hemodialysis -skin conditions or sensitivity -thyroid or parathyroid disease -an unusual reaction to denosumab, other medicines, foods, dyes, or preservatives -pregnant or trying to get pregnant -breast-feeding How should I use this medicine? This medicine is for injection under the skin. It is given by a health care professional in a hospital or clinic setting. If you are getting Prolia, a special MedGuide will be given to you by the pharmacist with each prescription and refill. Be sure to read this information carefully each time. For Prolia, talk to your pediatrician regarding the use of this medicine in children. Special care may be needed. For Delton See, talk to your pediatrician regarding the use of this medicine in children. While this drug may be prescribed for children as young as 13 years for selected conditions, precautions do apply. Overdosage: If you think you have taken too much of this medicine contact a poison control center or emergency room at once. NOTE: This medicine is only for you. Do not share this medicine with others. What if I miss a dose? It is important not to miss your dose. Call your doctor or health care professional if you are unable to keep an appointment. What may interact with this  medicine? Do not take this medicine with any of the following medications: -other medicines containing denosumab This medicine may also interact with the following medications: -medicines that lower your chance of fighting infection -steroid medicines like prednisone or cortisone This list may not describe all possible interactions. Give your health care provider a list of all the medicines, herbs, non-prescription drugs, or dietary supplements you use. Also tell them if you smoke, drink alcohol, or use illegal drugs. Some items may interact with your medicine. What should I watch for while using this medicine? Visit your doctor or health care professional for regular checks on your progress. Your doctor or health care professional may order blood tests and other tests to see how you are doing. Call your doctor or health care professional for advice if you get a fever, chills or sore throat, or other symptoms of a cold or flu. Do not treat yourself. This drug may decrease your body's ability to fight infection. Try to avoid being around people who are sick. You should make sure you get enough calcium and vitamin D while you are taking this medicine, unless your doctor tells you not to. Discuss the foods you eat and the vitamins you take with your health care professional. See your dentist regularly. Brush and floss your teeth as directed. Before you have any dental work done, tell your dentist you are receiving this medicine. Do  not become pregnant while taking this medicine or for 5 months after stopping it. Talk with your doctor or health care professional about your birth control options while taking this medicine. Women should inform their doctor if they wish to become pregnant or think they might be pregnant. There is a potential for serious side effects to an unborn child. Talk to your health care professional or pharmacist for more information. What side effects may I notice from receiving this  medicine? Side effects that you should report to your doctor or health care professional as soon as possible: -allergic reactions like skin rash, itching or hives, swelling of the face, lips, or tongue -bone pain -breathing problems -dizziness -jaw pain, especially after dental work -redness, blistering, peeling of the skin -signs and symptoms of infection like fever or chills; cough; sore throat; pain or trouble passing urine -signs of low calcium like fast heartbeat, muscle cramps or muscle pain; pain, tingling, numbness in the hands or feet; seizures -unusual bleeding or bruising -unusually weak or tired Side effects that usually do not require medical attention (report to your doctor or health care professional if they continue or are bothersome): -constipation -diarrhea -headache -joint pain -loss of appetite -muscle pain -runny nose -tiredness -upset stomach This list may not describe all possible side effects. Call your doctor for medical advice about side effects. You may report side effects to FDA at 1-800-FDA-1088. Where should I keep my medicine? This medicine is only given in a clinic, doctor's office, or other health care setting and will not be stored at home. NOTE: This sheet is a summary. It may not cover all possible information. If you have questions about this medicine, talk to your doctor, pharmacist, or health care provider.  2018 Elsevier/Gold Standard (2016-05-30 19:17:21)  

## 2017-07-02 NOTE — Telephone Encounter (Signed)
Per 2/11 patient declined calendar

## 2017-07-02 NOTE — Progress Notes (Signed)
ID: [name]   DOB: [birth year]  MR#: [id number]  XYB#:[id number]  PCP: [name], MD GYN: SU:  OTHER MD:  CHIEF COMPLAINT:  Metastatic Breast Cancer  CURRENT TREATMENT: Exemestane, fulvestrant, palbociclib, Delton See   INTERVAL HISTORY: Theresa Wallace returns today for follow-up and treatment of her estrogen receptor positive breast cancer, accompanied by her daughter. She continues on exemestane, with good tolerance.   She continues on fulvestrant with a dose due today. She tolerates this well.   She receives monthly Xgeva. She also tolerates this well.   She is also on palbociclib, at 100 mg daily, 21 days on, 7 days off.   Theresa Wallace is tolerating her treatment well.   REVIEW OF SYSTEMS: Theresa Wallace is slightly fatigued.  She denies any new pain anywhere.  She had a fungal rash underneath her breasts at her last appointment that resolved with Lotrisone cream that I prescribed her.  Other than that Theresa Wallace is doing well.  Her appetite is good.  She is moving around as she has been.  She denies any new difficulties such as fevers, chills, nausea, vomiting, constipation, or diarrhea.  She denies any dietary changes.    HISTORY OF PRESENT ILLNESS: From the prior summary:  Theresa Wallace is an 84 y.o.  [place] woman I saw briefly when she moved to this area in 2007.  She had had a left breast cancer removed at [facility] in [place] in [phone] ([place]).  It was grade 2, measured 1 cm maximally and was strongly estrogen and progesterone-receptor positive.  HER-2 was not checked.  This was T1b NX invasive ductal carcinoma, stage I, and she was treated after radiation with anastrozole for several years, eventually switching to Evista primarily for cost reasons.   More recently, she presented to Dr. Inda Merlin with fatigue and a dry cough.  He auscultated dullness to percussion at the left base and obtained a chest x-ray which confirmed the presence of a left pleural effusion.   Dr. Inda Merlin  scheduled Theresa Wallace for an ultrasound-guided left thoracentesis, and this was performed November 09, 2010.  Approximately 1 L of amber-colored fluid was removed.  Cytology from this procedure ([name]) showed only reactive mesothelial cells.   The patient was then referred here and set up for a CT of the chest and a PET scan.  The CT of the chest on June 20th showed 2 slightly enlarged left axillary lymph nodes with some irregular contours but more importantly multiple pleural nodules in the left chest measuring up to 4.6 cm.  The right chest was normal.  There was no pericardial effusion.  There was only a small to moderate left pleural effusion remaining, and aside from left lower lobe collapse or consolidation, there was no evidence of lung parenchymal involvement.  There was no evidence of bone involvement and also no calcified pleural plaques and no worrisome bone involvement.  PET scan on June 27th showed the nodularity along the left pleura to be intensely hypermetabolic.  For example the large lobe nodule measuring 4.5 cm had an SUV max of 12.9.  There was no hypermetabolic mediastinal nodal activity.  The to left axillary lymph nodes were very mildly hypermetabolic.  The left breast was clear.  There was 1 hypermetabolic lesion in the liver measuring 3.2 cm.  On June 27th, Dr. [name] performed fine-needle aspiration of a 7 mm abnormal-appearing lymph node in the lower left axilla.  The cytology from this procedure, however, ([name]) also was inconclusive showing no evidence  of nodal tissue, mostly blood and fatty tissue.   Her subsequent history is as detailed below   PAST MEDICAL HISTORY: Past Medical History:  Diagnosis Date  . Breast cancer metastasized to multiple sites ([place]) 06/07/2011  . Cancer ([place])    breast ca  . Hypertension   1. Hypercholesterolemia. 2. Benign tremor. 3. Sigmoid diverticulosis. 4. Onychomycosis. 5. Irritable bowel syndrome. 6. GERD. 7. History of palpitations  with Holter monitor in 2008 showing PACs. 8. History of hysterectomy with no salpingo-oophorectomy. 9. History of cholecystectomy. 10. History of bilateral knee arthroscopic surgery. History of hemorrhoid surgery.  FAMILY HISTORY [name] died in an automobile accident.  [name] died with "bone cancer."  The patient had 3 sisters.  One had breast cancer develop in her 42s.  There is also a cousin with breast cancer but no history of ovarian cancer or other history of cancer in first-degree relatives.  GYNECOLOGIC HISTORY: She is GX, P4, first pregnancy to term at age 44.  She was on the hormone replacement after her hysterectomy but only briefly and stopped when she had the initial diagnosis of breast cancer in 1996.  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  Her significant other Theresa Wallace who is a retired Chief Financial Officer is now suffering from dementia and is in a skilled nursing facility. The patient lives with her daughter Theresa Wallace who used to be a Network engineer for Dr. Inda Merlin, and Theresa Wallace's husband, Theresa Wallace, who is a physician's assistant with Dr. Durward Fortes.  The patient has 12 grandchildren and 5 great-grandchildren. Incidentally Theresa Wallace has a son in [place].   ADVANCED DIRECTIVES: In place  HEALTH MAINTENANCE: (Updated 02/26/2013) Social History   Tobacco Use  . Smoking status: Former Smoker    Last attempt to quit: 05/22/1968    Years since quitting: 49.1  . Smokeless tobacco: Never Used  Substance Use Topics  . Alcohol use: Yes  . Drug use: No     Colonoscopy:  PAP: s/p remote hysterectomy  Bone density:  10/23/2011, normal  Lipid panel: Dr. Inda Merlin, UTD   Allergies  Allergen Reactions  . Ciprofloxacin   . Epinephrine   . Tamoxifen   . Mupirocin Rash    Current Outpatient Medications  Medication Sig  Dispense Refill  . aspirin 81 MG tablet Take 81 mg by mouth daily.    . Calcium Carbonate-Vitamin D (CALCIUM-VITAMIN D) 500-200 MG-UNIT per tablet Take 1 tablet by mouth 2 (two) times daily with a meal.    . cholecalciferol (VITAMIN D) 400 UNITS TABS Take 2,000 Units by mouth daily.    . clotrimazole-betamethasone (LOTRISONE) cream Apply 1 application topically 2 (two) times daily. 30 g 0  . exemestane (AROMASIN) 25 MG tablet Take 1 tablet (25 mg total) by mouth daily. 90 tablet 3  . exemestane (AROMASIN) 25 MG tablet take 1 tablet by mouth once daily 90 tablet 1  . Glucosamine HCl-MSM (GLUCOSAMINE-MSM PO) Take 1 tablet by mouth daily.    Marland Kitchen ketoconazole (NIZORAL) 2 % shampoo Apply 1 application topically 2 (two) times daily as needed for irritation. 120 mL 0  . levothyroxine (SYNTHROID, LEVOTHROID) 25 MCG tablet Take 25 mcg by mouth daily.    Marland Kitchen losartan (COZAAR) 50 MG tablet     . metroNIDAZOLE (METROGEL) 0.75 % gel Apply topically 2 (two) times daily. For rosacea    .  Multiple Vitamin (MULTIVITAMIN) tablet Take 1 tablet by mouth daily.    . palbociclib (IBRANCE) 100 MG capsule Take 1 capsule (100 mg total) by mouth daily with breakfast. Take for 21 days on, 7 days off, repeat every 28 days. Take whole with food. 21 capsule 6  . propranolol ER (INDERAL LA) 60 MG 24 hr capsule      No current facility-administered medications for this visit.     OBJECTIVE:   Vitals:   07/02/17 1124 07/02/17 1157  BP: (!) 189/85 (!) 158/78  Pulse: 69   Resp: 18   Temp: 97.8 F (36.6 C)   SpO2: 98%    Body mass index is 28.22 kg/m.  Filed Weights   07/02/17 1124  Weight: 159 lb 4.8 oz (72.3 kg)  recheck manually 158/78 GENERAL: Patient is a well appearing older female in no acute distress HEENT:  Sclerae anicteric.  Oropharynx clear and moist. No ulcerations or evidence of oropharyngeal candidiasis. Neck is supple.  NODES:  No cervical, supraclavicular, or axillary lymphadenopathy palpated.   BREAST EXAM:  Deferred. LUNGS:  Clear to auscultation bilaterally.  No wheezes or rhonchi. HEART:  Regular rate and rhythm. No murmur appreciated. ABDOMEN:  Soft, nontender.  Positive, normoactive bowel sounds. No organomegaly palpated. MSK:  No focal spinal tenderness to palpation. Full range of motion bilaterally in the upper extremities. EXTREMITIES:  No peripheral edema.   SKIN:  Clear with no obvious rashes or skin changes. No nail dyscrasia. NEURO:  Nonfocal. Well oriented.  Appropriate affect.     LAB RESULTS: Lab Results  Component Value Date   WBC 3.7 (L) 07/02/2017   NEUTROABS 1.9 07/02/2017   HGB 10.9 (L) 07/02/2017   HCT 32.4 (L) 07/02/2017   MCV 102.6 (H) 07/02/2017   PLT 139 (L) 07/02/2017      Chemistry      Component Value Date/Time   NA 136 07/02/2017 1108   NA 129 (L) 05/07/2017 1045   K 5.6 (H) 07/02/2017 1108   K 5.0 05/07/2017 1045   CL 102 07/02/2017 1108   CL 104 10/24/2012 1014   CO2 28 07/02/2017 1108   CO2 25 05/07/2017 1045   BUN 26 07/02/2017 1108   BUN 17.6 05/07/2017 1045   CREATININE 1.23 (H) 07/02/2017 1108   CREATININE 1.1 05/07/2017 1045      Component Value Date/Time   CALCIUM 9.9 07/02/2017 1108   CALCIUM 9.3 05/07/2017 1045   ALKPHOS 36 (L) 07/02/2017 1108   ALKPHOS 37 (L) 05/07/2017 1045   AST 19 07/02/2017 1108   AST 20 05/07/2017 1045   ALT 13 07/02/2017 1108   ALT 13 05/07/2017 1045   BILITOT 0.8 07/02/2017 1108   BILITOT 1.55 (H) 05/07/2017 1045      IMAGING STUDIES: CA-27-29 results reviewed.  As of November there continues to be a downward trend which is favorable  ASSESSMENT: (1) Status post left lumpectomy in 1996 for a T1b N0 grade 2 invasive ductal carcinoma which was strongly estrogen and progesterone receptor positive, treated with radiation, then anastrozole, then Evista, all treatment discontinued July 2012  METASTATIC DISEASE: July 2012 (2) recurrence to the lining of the left lung and liver documented  by liver biopsy July 2012, showing metastatic adenocarcinoma estrogen and progesterone receptor positive at 96% and 97% respectively; there was no Her-2 amplification.   (3)  She has been on exemestane since early July of 2012, with chest CT scan April 2013 showing complete resolution of her lung and liver  metastases  (a) bone density in June of 08/09/2011 was normal  (b) bone density 03/20/2016 was normal with a T score of -0.6  (c) exemestane continued at the time of disease progression noted May 2018  (4) MRI of the lumbar spine 09-2012 showed significant degenerative disease but no evidence of metastatic disease to bone.  (5) disease progression noted May 2018: CT scan shows liver, lung, and bone lesions  (6) fulvestrant started 09/26/2016, palbociclib added 10/24/2016 at 125 mg daily, 21 days on. 7 days off  (a) dose decreased to 164m daily on 02/14/2017  (7) denosumab/Xgeva started 11/21/2016, repeated every 28 days  PLAN:  RShalisais doing well today. She will proceed with Fulvestrant and Xgeva.  She will continue with Exemestane.  She is due to restart Palbociclib in 2 days.  She will do that also.  Her CBC is stable and I reviewed this with her and her daughter in detail.  Her CMET is stable with the exception of her potassium.  Her potassium is 5.6.  She denies any issues surrounding this.  I reviewed it with Dr. MJana Hakim  We reviewed her medications and could not find anything that would contribute to this elevation.  I will prescribe HCTZ 12.5 mg per day for her to take.  I reviewed this change with the patient and her daughter in detail.  Also, at the patients request I gave her a list of potassium content of foods.  I printed this out for her in her avs.  She will return in one week for a STAT BMET to re evaluate her potassium.    Once again, per Dr. MVirgie Dadlast note: "She will be treated today and monthly.  She will see uKoreamonthly at least for the next 3 months to make sure her  Ibrance dose does not need to be adjusted.  We are planning restaging studies perhaps 6 months from now."  Patient and her daughter understand this plan, meaning scans would be due in May, 2019.  She knows to call for any other issues that may develop before the next visit.  A total of (30) minutes of face-to-face time was spent with this patient with greater than 50% of that time in counseling and care-coordination.   LWilber Bihari NP 07/02/17 11:57 AM Medical Oncology and Hematology [facility]ALake [place]  [id number]. [phone]   Fax. [phone]

## 2017-07-02 NOTE — Patient Instructions (Signed)

## 2017-07-03 LAB — CANCER ANTIGEN 27.29: CA 27.29: 144.6 U/mL — ABNORMAL HIGH (ref 0.0–38.6)

## 2017-07-09 ENCOUNTER — Inpatient Hospital Stay: Payer: Medicare Other

## 2017-07-09 DIAGNOSIS — E875 Hyperkalemia: Secondary | ICD-10-CM

## 2017-07-09 DIAGNOSIS — C50912 Malignant neoplasm of unspecified site of left female breast: Secondary | ICD-10-CM | POA: Diagnosis not present

## 2017-07-09 LAB — BASIC METABOLIC PANEL - CANCER CENTER ONLY
ANION GAP: 10 (ref 3–11)
BUN: 34 mg/dL — ABNORMAL HIGH (ref 7–26)
CALCIUM: 10 mg/dL (ref 8.4–10.4)
CO2: 27 mmol/L (ref 22–29)
Chloride: 96 mmol/L — ABNORMAL LOW (ref 98–109)
Creatinine: 1.7 mg/dL — ABNORMAL HIGH (ref 0.60–1.10)
GFR, EST AFRICAN AMERICAN: 29 mL/min — AB (ref >60)
GFR, EST NON AFRICAN AMERICAN: 25 mL/min — AB (ref >60)
Glucose, Bld: 93 mg/dL (ref 70–140)
Potassium: 4.7 mmol/L (ref 3.5–5.1)
SODIUM: 133 mmol/L — AB (ref 136–145)

## 2017-07-10 ENCOUNTER — Telehealth: Payer: Self-pay

## 2017-07-10 NOTE — Telephone Encounter (Signed)
Called patient to inform of improved potassium results.  Daughter, [name] answered and nurse spoke with her.  Daughter asked for a call back if anything will change with patient's HCTZ 12.5 mg dosage.  Nurse will ask NP and return call if appropriate.  Daughter voiced understanding and had no other questions or concerns at this at time.

## 2017-07-16 MED FILL — IBRANCE 100 MG CAPSULE: 100 | 28 days supply | Qty: 21 | Fill #1

## 2017-07-29 NOTE — Progress Notes (Signed)
ID: Theresa Wallace   DOB: 1923/10/07  MR#: [id number]  EHU#:[id number]  PCP: Theresa Huddle, MD GYN: SU:  OTHER MD:  CHIEF COMPLAINT:  Metastatic Breast Cancer  CURRENT TREATMENT: Exemestane, fulvestrant, palbociclib, Delton See   INTERVAL HISTORY: Theresa Wallace returns today for follow-up and treatment of her estrogen receptor positive breast cancer, accompanied by her daughter, Theresa Wallace. She continues on exemestane, with good tolerance.   She continues on fulvestrant with a dose due today. She tolerates this without issue.   She receives monthly Xgeva. She tolerates this without issue.   She is also on palbociclib, at 100 mg daily, 21 days on, 7 days off. She has mild fatigue from this and she will lay down for approximately 30 minutes. She takes Ibrance in the morning. She notes that prior to going out, she will lay or sit down prior too. She will start her next cycle on Wednesday, 08/01/17.   After a fall on 06/12/2017 she had a head CT without contrast showed no acute abnormalities.  REVIEW OF SYSTEMS: Theresa Wallace reports that for exercise, she is now taking a balance class at the [employer]. She recently read a book that was on the best sellers list that was an excellent read. She also got a book for her significant other, Theresa Wallace per recommendations that was helpful. She has had reflux issues and her daughter gave her Nexium PRN that seemed to help with her symptoms. She consumes fruit occasionally. She denies unusual headaches, visual changes, nausea, vomiting, or dizziness. There has been no unusual cough, phlegm production, or pleurisy. This been no change in bowel or bladder habits. She denies unexplained fatigue or unexplained weight loss, bleeding, rash, or fever. A detailed review of systems was otherwise stable.     HISTORY OF PRESENT ILLNESS: From the prior summary:  Theresa Wallace is an 84 y.o.  Theresa Wallace woman I saw briefly when she moved to this area in 2007.  She had had a left breast  cancer removed at [facility] in [place] in [phone] ([place]).  It was grade 2, measured 1 cm maximally and was strongly estrogen and progesterone-receptor positive.  HER-2 was not checked.  This was T1b NX invasive ductal carcinoma, stage I, and she was treated after radiation with anastrozole for several years, eventually switching to Evista primarily for cost reasons.   More recently, she presented to Dr. Inda Wallace with fatigue and a dry cough.  He auscultated dullness to percussion at the left base and obtained a chest x-ray which confirmed the presence of a left pleural effusion.   Dr. Inda Wallace scheduled Theresa Wallace for an ultrasound-guided left thoracentesis, and this was performed November 09, 2010.  Approximately 1 L of amber-colored fluid was removed.  Cytology from this procedure ([id number]) showed only reactive mesothelial cells.   The patient was then referred here and set up for a CT of the chest and a PET scan.  The CT of the chest on June 20th showed 2 slightly enlarged left axillary lymph nodes with some irregular contours but more importantly multiple pleural nodules in the left chest measuring up to 4.6 cm.  The right chest was normal.  There was no pericardial effusion.  There was only a small to moderate left pleural effusion remaining, and aside from left lower lobe collapse or consolidation, there was no evidence of lung parenchymal involvement.  There was no evidence of bone involvement and also no calcified pleural plaques and no worrisome bone involvement.  PET scan on  June 27th showed the nodularity along the left pleura to be intensely hypermetabolic.  For example the large lobe nodule measuring 4.5 cm had an SUV max of 12.9.  There was no hypermetabolic mediastinal nodal activity.  The to left axillary lymph nodes were very mildly hypermetabolic.  The left breast was clear.  There was 1 hypermetabolic lesion in the liver measuring 3.2 cm.  On June 27th, Dr. Isaiah Wallace performed  fine-needle aspiration of a 7 mm abnormal-appearing lymph node in the lower left axilla.  The cytology from this procedure, however, ([name]) also was inconclusive showing no evidence of nodal tissue, mostly blood and fatty tissue.   Her subsequent history is as detailed below   PAST MEDICAL HISTORY: Past Medical History:  Diagnosis Date  . Breast cancer metastasized to multiple sites ([name]) 06/07/2011  . Cancer ([place])    breast ca  . Hypertension   1. Hypercholesterolemia. 2. Benign tremor. 3. Sigmoid diverticulosis. 4. Onychomycosis. 5. Irritable bowel syndrome. 6. GERD. 7. History of palpitations with Holter monitor in 2008 showing PACs. 8. History of hysterectomy with no salpingo-oophorectomy. 9. History of cholecystectomy. 10. History of bilateral knee arthroscopic surgery. History of hemorrhoid surgery.  FAMILY HISTORY [name] died in an automobile accident.  [name] died with "bone cancer."  The patient had 3 sisters.  One had breast cancer develop in her 58s.  There is also a cousin with breast cancer but no history of ovarian cancer or other history of cancer in first-degree relatives.  GYNECOLOGIC HISTORY: She is GX, P4, first pregnancy to term at age 17.  She was on the hormone replacement after her hysterectomy but only briefly and stopped when she had the initial diagnosis of breast cancer in 1996.  SOCIAL HISTORY: (Updated May 2018) She worked as a Network engineer.  She has been widowed since 2005, her Wallace Theresa Wallace dying after a long illness involving strokes and a myocardial infarction.  Her significant other Theresa Wallace who is a retired Chief Financial Officer is now suffering from dementia and is in a skilled nursing facility. The patient lives with her daughter Theresa Wallace who used to be a Network engineer for Dr. Inda Wallace, and Theresa Wallace, Theresa Wallace, who is a physician's assistant with Dr. Durward Wallace.  The patient has 12 grandchildren and 5 great-grandchildren.  Incidentally Theresa Wallace has a son in [place].   ADVANCED DIRECTIVES: In place  HEALTH MAINTENANCE: (Updated 02/26/2013) Social History   Tobacco Use  . Smoking status: Former Smoker    Last attempt to quit: 05/22/1968    Years since quitting: 49.2  . Smokeless tobacco: Never Used  Substance Use Topics  . Alcohol use: Yes  . Drug use: No     Colonoscopy:  PAP: s/p remote hysterectomy  Bone density:  10/23/2011, normal  Lipid panel: Dr. Inda Wallace, UTD   Allergies  Allergen Reactions  . Ciprofloxacin   . Epinephrine   . Tamoxifen   . Mupirocin Rash    Current Outpatient Medications  Medication Sig Dispense Refill  . aspirin 81 MG tablet Take 81 mg by mouth daily.    . Calcium Carbonate-Vitamin D (CALCIUM-VITAMIN D) 500-200 MG-UNIT per tablet Take 1 tablet by mouth 2 (two) times daily with a meal.    . cholecalciferol (VITAMIN D) 400 UNITS TABS Take 2,000 Units by mouth daily.    . clotrimazole-betamethasone (LOTRISONE) cream Apply 1 application topically 2 (two) times daily. 30 g 0  . exemestane (AROMASIN) 25 MG tablet Take 1 tablet (25 mg total) by mouth  daily. 90 tablet 3  . exemestane (AROMASIN) 25 MG tablet take 1 tablet by mouth once daily 90 tablet 1  . Glucosamine HCl-MSM (GLUCOSAMINE-MSM PO) Take 1 tablet by mouth daily.    . hydrochlorothiazide (HYDRODIURIL) 12.5 MG tablet Take 1 tablet (12.5 mg total) by mouth daily. 30 tablet 2  . ketoconazole (NIZORAL) 2 % shampoo Apply 1 application topically 2 (two) times daily as needed for irritation. 120 mL 0  . levothyroxine (SYNTHROID, LEVOTHROID) 25 MCG tablet Take 25 mcg by mouth daily.    Marland Kitchen losartan (COZAAR) 50 MG tablet     . metroNIDAZOLE (METROGEL) 0.75 % gel Apply topically 2 (two) times daily. For rosacea    . Multiple Vitamin (MULTIVITAMIN) tablet Take 1 tablet by mouth daily.    . palbociclib (IBRANCE) 100 MG capsule Take 1 capsule (100 mg total) by mouth daily with breakfast. Take for 21 days on, 7 days off, repeat  every 28 days. Take whole with food. 21 capsule 6  . propranolol ER (INDERAL LA) 60 MG 24 hr capsule      No current facility-administered medications for this visit.     OBJECTIVE: Elderly white woman who appears stated age  44:   07/30/17 1126  BP: (!) 151/54  Pulse: 64  Resp: 15  Temp: 98.1 F (36.7 C)  SpO2: 98%   Body mass index is 27.86 kg/m.  Filed Weights   07/30/17 1126  Weight: 157 lb 4.8 oz (71.4 kg)   Sclerae unicteric, pupils round and equal Oropharynx clear and moist No cervical or supraclavicular adenopathy Lungs no rales or rhonchi Heart regular rate and rhythm Abd soft, nontender, positive bowel sounds MSK no focal spinal tenderness, no upper extremity lymphedema Neuro: nonfocal, well oriented, appropriate affect Breasts: The right breast is unremarkable.  The left breast is undergone lumpectomy but there is no evidence of disease activity.  Both axillae are benign.    LAB RESULTS: Lab Results  Component Value Date   WBC 2.9 (L) 07/30/2017   NEUTROABS 1.3 (L) 07/30/2017   HGB 10.4 (L) 07/30/2017   HCT 29.9 (L) 07/30/2017   MCV 100.0 07/30/2017   PLT 114 (L) 07/30/2017      Chemistry      Component Value Date/Time   NA 133 (L) 07/09/2017 1133   NA 129 (L) 05/07/2017 1045   K 4.7 07/09/2017 1133   K 5.0 05/07/2017 1045   CL 96 (L) 07/09/2017 1133   CL 104 10/24/2012 1014   CO2 27 07/09/2017 1133   CO2 25 05/07/2017 1045   BUN 34 (H) 07/09/2017 1133   BUN 17.6 05/07/2017 1045   CREATININE 1.70 (H) 07/09/2017 1133   CREATININE 1.1 05/07/2017 1045      Component Value Date/Time   CALCIUM 10.0 07/09/2017 1133   CALCIUM 9.3 05/07/2017 1045   ALKPHOS 36 (L) 07/02/2017 1108   ALKPHOS 37 (L) 05/07/2017 1045   AST 19 07/02/2017 1108   AST 20 05/07/2017 1045   ALT 13 07/02/2017 1108   ALT 13 05/07/2017 1045   BILITOT 0.8 07/02/2017 1108   BILITOT 1.55 (H) 05/07/2017 1045      IMAGING STUDIES: She had her most recent PET scan in  November.  We will plan to repeat that late May or early June of this year.  ASSESSMENT: (1) Status post left lumpectomy in 1996 for a T1b N0 grade 2 invasive ductal carcinoma which was strongly estrogen and progesterone receptor positive, treated with radiation, then anastrozole, then  Evista, all treatment discontinued July 2012  METASTATIC DISEASE: July 2012 (2) recurrence to the lining of the left lung and liver documented by liver biopsy July 2012, showing metastatic adenocarcinoma estrogen and progesterone receptor positive at 96% and 97% respectively; there was no Her-2 amplification.   (3)  She has been on exemestane since early July of 2012, with chest CT scan April 2013 showing complete resolution of her lung and liver metastases  (a) bone density in June of 08/09/2011 was normal  (b) bone density 03/20/2016 was normal with a T score of -0.6  (c) exemestane continued at the time of disease progression noted May 2018  (d) exemestane discontinued April 2019  (4) MRI of the lumbar spine 09-2012 showed significant degenerative disease but no evidence of metastatic disease to bone.  (5) disease progression noted May 2018: CT scan shows liver, lung, and bone lesions  (6) fulvestrant started 09/26/2016, palbociclib added 10/24/2016 at 125 mg daily, 21 days on. 7 days off  (a) dose decreased to 17m daily on 02/14/2017  (7) denosumab/Xgeva started 11/21/2016, repeated every 28 days  PLAN:   RInethacontinues to do remarkably well.  Her CA-27-29 continues to drop.  We are going to continue the current treatment except at this point I feel comfortable stopping the exemestane.  She has about a month left and she is going to take those medications and then stop.  She tolerates the fulvestrant with only some discomfort from the shot but no other side effects.  She has been doing quite well as far as the denosumab/Xgeva and Theresa Homeare concerned.  However we likely will need to drop the Ibrance  dose to 75 at some point in the next few months since her ANorth Vandergriftis dropping slightly as are her platelets  She did read "the 36-hour day" and it seems to have helped with her approach to [place] significant other with dementia.  She will return to see u[place]on a monthly basis as before.  She knows to call for any other problems that may develop before the next visit.  Allahna Wallace, GVirgie Dad MD  07/30/17 11:37 AM Medical Oncology and Hematology [facility] DriveACrisfield [place] [address]Tel. [phone]   Fax. [phone] This document serves as a record of services personally performed by GLurline Del MD. It was created on his behalf by SSteva Colder a trained medical scribe. The creation of this record is based on the scribe's personal observations and the provider's statements to them.  I have reviewed the above documentation for accuracy and completeness, and I agree with the above.

## 2017-07-30 ENCOUNTER — Inpatient Hospital Stay: Payer: Medicare Other

## 2017-07-30 ENCOUNTER — Telehealth: Payer: Self-pay | Admitting: Oncology

## 2017-07-30 ENCOUNTER — Inpatient Hospital Stay: Payer: Medicare Other | Attending: Adult Health | Admitting: Oncology

## 2017-07-30 VITALS — BP 151/54 | HR 64 | Temp 98.1°F | Resp 15 | Ht 63.0 in | Wt 157.3 lb

## 2017-07-30 DIAGNOSIS — Z853 Personal history of malignant neoplasm of breast: Secondary | ICD-10-CM | POA: Diagnosis not present

## 2017-07-30 DIAGNOSIS — Z170 Estrogen receptor positive status [ER+]: Secondary | ICD-10-CM | POA: Diagnosis not present

## 2017-07-30 DIAGNOSIS — Z79899 Other long term (current) drug therapy: Secondary | ICD-10-CM | POA: Insufficient documentation

## 2017-07-30 DIAGNOSIS — C50812 Malignant neoplasm of overlapping sites of left female breast: Secondary | ICD-10-CM

## 2017-07-30 DIAGNOSIS — I10 Essential (primary) hypertension: Secondary | ICD-10-CM | POA: Diagnosis not present

## 2017-07-30 DIAGNOSIS — K219 Gastro-esophageal reflux disease without esophagitis: Secondary | ICD-10-CM | POA: Diagnosis not present

## 2017-07-30 DIAGNOSIS — Z808 Family history of malignant neoplasm of other organs or systems: Secondary | ICD-10-CM | POA: Diagnosis not present

## 2017-07-30 DIAGNOSIS — Z87891 Personal history of nicotine dependence: Secondary | ICD-10-CM | POA: Diagnosis not present

## 2017-07-30 DIAGNOSIS — Z8719 Personal history of other diseases of the digestive system: Secondary | ICD-10-CM | POA: Diagnosis not present

## 2017-07-30 DIAGNOSIS — Z7982 Long term (current) use of aspirin: Secondary | ICD-10-CM | POA: Diagnosis not present

## 2017-07-30 DIAGNOSIS — Z803 Family history of malignant neoplasm of breast: Secondary | ICD-10-CM | POA: Insufficient documentation

## 2017-07-30 DIAGNOSIS — Z79818 Long term (current) use of other agents affecting estrogen receptors and estrogen levels: Secondary | ICD-10-CM | POA: Diagnosis not present

## 2017-07-30 DIAGNOSIS — J410 Simple chronic bronchitis: Secondary | ICD-10-CM

## 2017-07-30 DIAGNOSIS — C50912 Malignant neoplasm of unspecified site of left female breast: Secondary | ICD-10-CM

## 2017-07-30 DIAGNOSIS — C50919 Malignant neoplasm of unspecified site of unspecified female breast: Secondary | ICD-10-CM

## 2017-07-30 DIAGNOSIS — R5383 Other fatigue: Secondary | ICD-10-CM | POA: Insufficient documentation

## 2017-07-30 DIAGNOSIS — E7800 Pure hypercholesterolemia, unspecified: Secondary | ICD-10-CM | POA: Diagnosis not present

## 2017-07-30 LAB — COMPREHENSIVE METABOLIC PANEL
ALBUMIN: 3.8 g/dL (ref 3.5–5.0)
ALT: 14 U/L (ref 0–55)
AST: 18 U/L (ref 5–34)
Alkaline Phosphatase: 32 U/L — ABNORMAL LOW (ref 40–150)
Anion gap: 7 (ref 3–11)
BILIRUBIN TOTAL: 1 mg/dL (ref 0.2–1.2)
BUN: 29 mg/dL — AB (ref 7–26)
CO2: 28 mmol/L (ref 22–29)
Calcium: 9.7 mg/dL (ref 8.4–10.4)
Chloride: 97 mmol/L — ABNORMAL LOW (ref 98–109)
Creatinine, Ser: 1.27 mg/dL — ABNORMAL HIGH (ref 0.60–1.10)
GFR calc Af Amer: 41 mL/min — ABNORMAL LOW (ref >60)
GFR calc non Af Amer: 36 mL/min — ABNORMAL LOW (ref >60)
GLUCOSE: 113 mg/dL (ref 70–140)
POTASSIUM: 4.7 mmol/L (ref 3.5–5.1)
SODIUM: 132 mmol/L — AB (ref 136–145)
TOTAL PROTEIN: 6.5 g/dL (ref 6.4–8.3)

## 2017-07-30 LAB — CBC WITH DIFFERENTIAL/PLATELET
BASOS ABS: 0.1 10*3/uL (ref 0.0–0.1)
BASOS PCT: 3 %
EOS ABS: 0.1 10*3/uL (ref 0.0–0.5)
Eosinophils Relative: 2 %
HEMATOCRIT: 29.9 % — AB (ref 34.8–46.6)
HEMOGLOBIN: 10.4 g/dL — AB (ref 11.6–15.9)
Lymphocytes Relative: 40 %
Lymphs Abs: 1.1 10*3/uL (ref 0.9–3.3)
MCH: 34.8 pg — ABNORMAL HIGH (ref 25.1–34.0)
MCHC: 34.8 g/dL (ref 31.5–36.0)
MCV: 100 fL (ref 79.5–101.0)
MONOS PCT: 9 %
Monocytes Absolute: 0.3 10*3/uL (ref 0.1–0.9)
NEUTROS ABS: 1.3 10*3/uL — AB (ref 1.5–6.5)
NEUTROS PCT: 46 %
Platelets: 114 10*3/uL — ABNORMAL LOW (ref 145–400)
RBC: 2.99 MIL/uL — AB (ref 3.70–5.45)
RDW: 13.5 % (ref 11.2–14.5)
WBC: 2.9 10*3/uL — AB (ref 3.9–10.3)

## 2017-07-30 MED ORDER — FULVESTRANT 250 MG/5ML IM SOLN
INTRAMUSCULAR | Status: AC
  Filled 2017-07-30: qty 5

## 2017-07-30 MED ORDER — DENOSUMAB 120 MG/1.7ML ~~LOC~~ SOLN
SUBCUTANEOUS | Status: AC
  Filled 2017-07-30: qty 1.7

## 2017-07-30 MED ORDER — FULVESTRANT 250 MG/5ML IM SOLN
500.0000 mg | Freq: Once | INTRAMUSCULAR | Status: AC
  Administered 2017-07-30: 500 mg via INTRAMUSCULAR

## 2017-07-30 MED ORDER — DENOSUMAB 120 MG/1.7ML ~~LOC~~ SOLN
120.0000 mg | Freq: Once | SUBCUTANEOUS | Status: AC
  Administered 2017-07-30: 120 mg via SUBCUTANEOUS

## 2017-07-30 NOTE — Telephone Encounter (Signed)
Per 3/11 patient decline avs

## 2017-07-31 LAB — CANCER ANTIGEN 27.29: CAN 27.29: 137.9 U/mL — AB (ref 0.0–38.6)

## 2017-08-10 ENCOUNTER — Other Ambulatory Visit: Payer: Self-pay | Admitting: Pharmacist

## 2017-08-13 MED FILL — IBRANCE 100 MG CAPSULE: 100 | 28 days supply | Qty: 21 | Fill #2

## 2017-08-27 ENCOUNTER — Inpatient Hospital Stay: Payer: Medicare Other | Attending: Adult Health

## 2017-08-27 ENCOUNTER — Inpatient Hospital Stay: Payer: Medicare Other

## 2017-08-27 ENCOUNTER — Inpatient Hospital Stay: Payer: Medicare Other | Admitting: Adult Health

## 2017-08-27 ENCOUNTER — Other Ambulatory Visit: Payer: Self-pay | Admitting: Oncology

## 2017-08-27 ENCOUNTER — Encounter: Payer: Self-pay | Admitting: Adult Health

## 2017-08-27 VITALS — BP 126/61 | HR 72 | Temp 98.5°F | Resp 18 | Ht 63.0 in | Wt 155.5 lb

## 2017-08-27 DIAGNOSIS — C50912 Malignant neoplasm of unspecified site of left female breast: Secondary | ICD-10-CM

## 2017-08-27 DIAGNOSIS — C787 Secondary malignant neoplasm of liver and intrahepatic bile duct: Secondary | ICD-10-CM | POA: Insufficient documentation

## 2017-08-27 DIAGNOSIS — C50812 Malignant neoplasm of overlapping sites of left female breast: Secondary | ICD-10-CM | POA: Insufficient documentation

## 2017-08-27 DIAGNOSIS — J410 Simple chronic bronchitis: Secondary | ICD-10-CM

## 2017-08-27 DIAGNOSIS — R5383 Other fatigue: Secondary | ICD-10-CM

## 2017-08-27 DIAGNOSIS — Z853 Personal history of malignant neoplasm of breast: Secondary | ICD-10-CM | POA: Diagnosis not present

## 2017-08-27 DIAGNOSIS — J90 Pleural effusion, not elsewhere classified: Secondary | ICD-10-CM | POA: Insufficient documentation

## 2017-08-27 DIAGNOSIS — C7800 Secondary malignant neoplasm of unspecified lung: Secondary | ICD-10-CM | POA: Diagnosis not present

## 2017-08-27 DIAGNOSIS — Z8719 Personal history of other diseases of the digestive system: Secondary | ICD-10-CM

## 2017-08-27 DIAGNOSIS — Z809 Family history of malignant neoplasm, unspecified: Secondary | ICD-10-CM | POA: Insufficient documentation

## 2017-08-27 DIAGNOSIS — Z170 Estrogen receptor positive status [ER+]: Principal | ICD-10-CM

## 2017-08-27 DIAGNOSIS — Z79899 Other long term (current) drug therapy: Secondary | ICD-10-CM | POA: Insufficient documentation

## 2017-08-27 DIAGNOSIS — Z9049 Acquired absence of other specified parts of digestive tract: Secondary | ICD-10-CM

## 2017-08-27 DIAGNOSIS — K219 Gastro-esophageal reflux disease without esophagitis: Secondary | ICD-10-CM | POA: Insufficient documentation

## 2017-08-27 DIAGNOSIS — C50919 Malignant neoplasm of unspecified site of unspecified female breast: Secondary | ICD-10-CM

## 2017-08-27 DIAGNOSIS — Z7982 Long term (current) use of aspirin: Secondary | ICD-10-CM | POA: Insufficient documentation

## 2017-08-27 DIAGNOSIS — Z90710 Acquired absence of both cervix and uterus: Secondary | ICD-10-CM | POA: Diagnosis not present

## 2017-08-27 DIAGNOSIS — Z87891 Personal history of nicotine dependence: Secondary | ICD-10-CM | POA: Diagnosis not present

## 2017-08-27 DIAGNOSIS — C50922 Malignant neoplasm of unspecified site of left male breast: Secondary | ICD-10-CM

## 2017-08-27 DIAGNOSIS — Z79818 Long term (current) use of other agents affecting estrogen receptors and estrogen levels: Secondary | ICD-10-CM | POA: Diagnosis not present

## 2017-08-27 DIAGNOSIS — E7800 Pure hypercholesterolemia, unspecified: Secondary | ICD-10-CM

## 2017-08-27 DIAGNOSIS — Z7189 Other specified counseling: Secondary | ICD-10-CM

## 2017-08-27 LAB — COMPREHENSIVE METABOLIC PANEL
ALT: 15 U/L (ref 0–55)
ANION GAP: 8 (ref 3–11)
AST: 19 U/L (ref 5–34)
Albumin: 3.9 g/dL (ref 3.5–5.0)
Alkaline Phosphatase: 35 U/L — ABNORMAL LOW (ref 40–150)
BUN: 39 mg/dL — ABNORMAL HIGH (ref 7–26)
CALCIUM: 9.8 mg/dL (ref 8.4–10.4)
CHLORIDE: 105 mmol/L (ref 98–109)
CO2: 27 mmol/L (ref 22–29)
Creatinine, Ser: 1.69 mg/dL — ABNORMAL HIGH (ref 0.60–1.10)
GFR calc non Af Amer: 25 mL/min — ABNORMAL LOW (ref >60)
GFR, EST AFRICAN AMERICAN: 29 mL/min — AB (ref >60)
Glucose, Bld: 105 mg/dL (ref 70–140)
Potassium: 5.2 mmol/L — ABNORMAL HIGH (ref 3.5–5.1)
SODIUM: 140 mmol/L (ref 136–145)
Total Bilirubin: 0.8 mg/dL (ref 0.2–1.2)
Total Protein: 6.7 g/dL (ref 6.4–8.3)

## 2017-08-27 LAB — CBC WITH DIFFERENTIAL/PLATELET
Basophils Absolute: 0.1 10*3/uL (ref 0.0–0.1)
Basophils Relative: 2 %
EOS ABS: 0.1 10*3/uL (ref 0.0–0.5)
Eosinophils Relative: 2 %
HCT: 31.8 % — ABNORMAL LOW (ref 34.8–46.6)
HEMOGLOBIN: 10.8 g/dL — AB (ref 11.6–15.9)
LYMPHS ABS: 1.3 10*3/uL (ref 0.9–3.3)
LYMPHS PCT: 39 %
MCH: 35 pg — AB (ref 25.1–34.0)
MCHC: 34 g/dL (ref 31.5–36.0)
MCV: 102.9 fL — AB (ref 79.5–101.0)
Monocytes Absolute: 0.4 10*3/uL (ref 0.1–0.9)
Monocytes Relative: 12 %
NEUTROS PCT: 45 %
Neutro Abs: 1.5 10*3/uL (ref 1.5–6.5)
Platelets: 130 10*3/uL — ABNORMAL LOW (ref 145–400)
RBC: 3.09 MIL/uL — ABNORMAL LOW (ref 3.70–5.45)
RDW: 14.3 % (ref 11.2–14.5)
WBC: 3.2 10*3/uL — AB (ref 3.9–10.3)

## 2017-08-27 MED ORDER — DENOSUMAB 120 MG/1.7ML ~~LOC~~ SOLN
120.0000 mg | Freq: Once | SUBCUTANEOUS | Status: AC
  Administered 2017-08-27: 120 mg via SUBCUTANEOUS

## 2017-08-27 MED ORDER — FULVESTRANT 250 MG/5ML IM SOLN
500.0000 mg | Freq: Once | INTRAMUSCULAR | Status: AC
  Administered 2017-08-27: 500 mg via INTRAMUSCULAR

## 2017-08-27 MED ORDER — FULVESTRANT 250 MG/5ML IM SOLN
INTRAMUSCULAR | Status: AC
  Filled 2017-08-27: qty 5

## 2017-08-27 NOTE — Patient Instructions (Signed)
Fulvestrant injection What is this medicine? FULVESTRANT (ful VES trant) blocks the effects of estrogen. It is used to treat breast cancer. This medicine may be used for other purposes; ask your health care provider or pharmacist if you have questions. COMMON BRAND NAME(S): FASLODEX What should I tell my health care provider before I take this medicine? They need to know if you have any of these conditions: -bleeding problems -liver disease -low levels of platelets in the blood -an unusual or allergic reaction to fulvestrant, other medicines, foods, dyes, or preservatives -pregnant or trying to get pregnant -breast-feeding How should I use this medicine? This medicine is for injection into a muscle. It is usually given by a health care professional in a hospital or clinic setting. Talk to your pediatrician regarding the use of this medicine in children. Special care may be needed. Overdosage: If you think you have taken too much of this medicine contact a poison control center or emergency room at once. NOTE: This medicine is only for you. Do not share this medicine with others. What if I miss a dose? It is important not to miss your dose. Call your doctor or health care professional if you are unable to keep an appointment. What may interact with this medicine? -medicines that treat or prevent blood clots like warfarin, enoxaparin, and dalteparin This list may not describe all possible interactions. Give your health care provider a list of all the medicines, herbs, non-prescription drugs, or dietary supplements you use. Also tell them if you smoke, drink alcohol, or use illegal drugs. Some items may interact with your medicine. What should I watch for while using this medicine? Your condition will be monitored carefully while you are receiving this medicine. You will need important blood work done while you are taking this medicine. Do not become pregnant while taking this medicine or for  at least 1 year after stopping it. Women of child-bearing potential will need to have a negative pregnancy test before starting this medicine. Women should inform their doctor if they wish to become pregnant or think they might be pregnant. There is a potential for serious side effects to an unborn child. Men should inform their doctors if they wish to father a child. This medicine may lower sperm counts. Talk to your health care professional or pharmacist for more information. Do not breast-feed an infant while taking this medicine or for 1 year after the last dose. What side effects may I notice from receiving this medicine? Side effects that you should report to your doctor or health care professional as soon as possible: -allergic reactions like skin rash, itching or hives, swelling of the face, lips, or tongue -feeling faint or lightheaded, falls -pain, tingling, numbness, or weakness in the legs -signs and symptoms of infection like fever or chills; cough; flu-like symptoms; sore throat -vaginal bleeding Side effects that usually do not require medical attention (report to your doctor or health care professional if they continue or are bothersome): -aches, pains -constipation -diarrhea -headache -hot flashes -nausea, vomiting -pain at site where injected -stomach pain This list may not describe all possible side effects. Call your doctor for medical advice about side effects. You may report side effects to FDA at 1-800-FDA-1088. Where should I keep my medicine? This drug is given in a hospital or clinic and will not be stored at home. NOTE: This sheet is a summary. It may not cover all possible information. If you have questions about this medicine, talk to your   doctor, pharmacist, or health care provider.  2018 Elsevier/Gold Standard (2014-12-04 11:03:55) Denosumab injection What is this medicine? DENOSUMAB (den oh sue mab) slows bone breakdown. Prolia is used to treat osteoporosis in  women after menopause and in men. Delton See is used to treat a high calcium level due to cancer and to prevent bone fractures and other bone problems caused by multiple myeloma or cancer bone metastases. Delton See is also used to treat giant cell tumor of the bone. This medicine may be used for other purposes; ask your health care provider or pharmacist if you have questions. COMMON BRAND NAME(S): Prolia, XGEVA What should I tell my health care provider before I take this medicine? They need to know if you have any of these conditions: -dental disease -having surgery or tooth extraction -infection -kidney disease -low levels of calcium or Vitamin D in the blood -malnutrition -on hemodialysis -skin conditions or sensitivity -thyroid or parathyroid disease -an unusual reaction to denosumab, other medicines, foods, dyes, or preservatives -pregnant or trying to get pregnant -breast-feeding How should I use this medicine? This medicine is for injection under the skin. It is given by a health care professional in a hospital or clinic setting. If you are getting Prolia, a special MedGuide will be given to you by the pharmacist with each prescription and refill. Be sure to read this information carefully each time. For Prolia, talk to your pediatrician regarding the use of this medicine in children. Special care may be needed. For Delton See, talk to your pediatrician regarding the use of this medicine in children. While this drug may be prescribed for children as young as 13 years for selected conditions, precautions do apply. Overdosage: If you think you have taken too much of this medicine contact a poison control center or emergency room at once. NOTE: This medicine is only for you. Do not share this medicine with others. What if I miss a dose? It is important not to miss your dose. Call your doctor or health care professional if you are unable to keep an appointment. What may interact with this  medicine? Do not take this medicine with any of the following medications: -other medicines containing denosumab This medicine may also interact with the following medications: -medicines that lower your chance of fighting infection -steroid medicines like prednisone or cortisone This list may not describe all possible interactions. Give your health care provider a list of all the medicines, herbs, non-prescription drugs, or dietary supplements you use. Also tell them if you smoke, drink alcohol, or use illegal drugs. Some items may interact with your medicine. What should I watch for while using this medicine? Visit your doctor or health care professional for regular checks on your progress. Your doctor or health care professional may order blood tests and other tests to see how you are doing. Call your doctor or health care professional for advice if you get a fever, chills or sore throat, or other symptoms of a cold or flu. Do not treat yourself. This drug may decrease your body's ability to fight infection. Try to avoid being around people who are sick. You should make sure you get enough calcium and vitamin D while you are taking this medicine, unless your doctor tells you not to. Discuss the foods you eat and the vitamins you take with your health care professional. See your dentist regularly. Brush and floss your teeth as directed. Before you have any dental work done, tell your dentist you are receiving this medicine. Do  not become pregnant while taking this medicine or for 5 months after stopping it. Talk with your doctor or health care professional about your birth control options while taking this medicine. Women should inform their doctor if they wish to become pregnant or think they might be pregnant. There is a potential for serious side effects to an unborn child. Talk to your health care professional or pharmacist for more information. What side effects may I notice from receiving this  medicine? Side effects that you should report to your doctor or health care professional as soon as possible: -allergic reactions like skin rash, itching or hives, swelling of the face, lips, or tongue -bone pain -breathing problems -dizziness -jaw pain, especially after dental work -redness, blistering, peeling of the skin -signs and symptoms of infection like fever or chills; cough; sore throat; pain or trouble passing urine -signs of low calcium like fast heartbeat, muscle cramps or muscle pain; pain, tingling, numbness in the hands or feet; seizures -unusual bleeding or bruising -unusually weak or tired Side effects that usually do not require medical attention (report to your doctor or health care professional if they continue or are bothersome): -constipation -diarrhea -headache -joint pain -loss of appetite -muscle pain -runny nose -tiredness -upset stomach This list may not describe all possible side effects. Call your doctor for medical advice about side effects. You may report side effects to FDA at 1-800-FDA-1088. Where should I keep my medicine? This medicine is only given in a clinic, doctor's office, or other health care setting and will not be stored at home. NOTE: This sheet is a summary. It may not cover all possible information. If you have questions about this medicine, talk to your doctor, pharmacist, or health care provider.  2018 Elsevier/Gold Standard (2016-05-30 19:17:21)  

## 2017-08-27 NOTE — Progress Notes (Signed)
ID: Theresa Wallace   DOB: 22-Jun-1923  MR#: [id number]  OEU#:[id number]  PCP: [name], MD GYN: SU:  OTHER MD:  CHIEF COMPLAINT:  Metastatic Breast Cancer  CURRENT TREATMENT: fulvestrant, palbociclib, Delton See   INTERVAL HISTORY: Theresa Wallace returns today for follow-up and treatment of her estrogen receptor positive breast cancer, accompanied by her daughter, [name].   She continues on fulvestrant with a dose due today. She tolerates this without issue.   She receives monthly Xgeva. She tolerates this without issue.   She is also on palbociclib, at 100 mg daily, 21 days on, 7 days off. She has mild fatigue from this and she will lay down for approximately 30 minutes. She takes Ibrance in the morning. She notes that prior to going out, she will lay or sit down prior too. She is due to start her next cycle tomorrow on 08/28/2017.  REVIEW OF SYSTEMS: Dyanna is fatigued.  Her 32 month old great grand child is at her home and she is spending a lot of time with her.  She hasn't been taking her usual daytime naps because she wants to spend as much time as she can with her.  Dionisia has a sore throat and some nasal drainage.  She says that she took some allergy medication and it improved.  Azhane denies any new pain, cough, fever, chills, headaches, nausea, vomiting, or any other concerns.  A detailed ROS was otherwise non contributory today.     HISTORY OF PRESENT ILLNESS: From the prior summary:  Theresa Wallace is an 84 y.o.  [place] woman I saw briefly when she moved to this area in 2007.  She had had a left breast cancer removed at [facility] in [place] in [phone] ([place]).  It was grade 2, measured 1 cm maximally and was strongly estrogen and progesterone-receptor positive.  HER-2 was not checked.  This was T1b NX invasive ductal carcinoma, stage I, and she was treated after radiation with anastrozole for several years, eventually switching to Evista primarily for cost reasons.    More recently, she presented to Dr. Inda Merlin with fatigue and a dry cough.  He auscultated dullness to percussion at the left base and obtained a chest x-ray which confirmed the presence of a left pleural effusion.   Dr. Inda Merlin scheduled Theresa Wallace for an ultrasound-guided left thoracentesis, and this was performed November 09, 2010.  Approximately 1 L of amber-colored fluid was removed.  Cytology from this procedure ([id number]) showed only reactive mesothelial cells.   The patient was then referred here and set up for a CT of the chest and a PET scan.  The CT of the chest on June 20th showed 2 slightly enlarged left axillary lymph nodes with some irregular contours but more importantly multiple pleural nodules in the left chest measuring up to 4.6 cm.  The right chest was normal.  There was no pericardial effusion.  There was only a small to moderate left pleural effusion remaining, and aside from left lower lobe collapse or consolidation, there was no evidence of lung parenchymal involvement.  There was no evidence of bone involvement and also no calcified pleural plaques and no worrisome bone involvement.  PET scan on June 27th showed the nodularity along the left pleura to be intensely hypermetabolic.  For example the large lobe nodule measuring 4.5 cm had an SUV max of 12.9.  There was no hypermetabolic mediastinal nodal activity.  The to left axillary lymph nodes were very mildly hypermetabolic.  The  left breast was clear.  There was 1 hypermetabolic lesion in the liver measuring 3.2 cm.  On June 27th, Dr. [name] performed fine-needle aspiration of a 7 mm abnormal-appearing lymph node in the lower left axilla.  The cytology from this procedure, however, ([name]) also was inconclusive showing no evidence of nodal tissue, mostly blood and fatty tissue.   Her subsequent history is as detailed below   PAST MEDICAL HISTORY: Past Medical History:  Diagnosis Date  . Breast cancer metastasized to multiple  sites ([place]) 06/07/2011  . Cancer ([place])    breast ca  . Hypertension   1. Hypercholesterolemia. 2. Benign tremor. 3. Sigmoid diverticulosis. 4. Onychomycosis. 5. Irritable bowel syndrome. 6. GERD. 7. History of palpitations with Holter monitor in 2008 showing PACs. 8. History of hysterectomy with no salpingo-oophorectomy. 9. History of cholecystectomy. 10. History of bilateral knee arthroscopic surgery. History of hemorrhoid surgery.  FAMILY HISTORY [name] died in an automobile accident.  [name] died with "bone cancer."  The patient had 3 sisters.  One had breast cancer develop in her 43s.  There is also a cousin with breast cancer but no history of ovarian cancer or other history of cancer in first-degree relatives.  GYNECOLOGIC HISTORY: She is GX, P4, first pregnancy to term at age 70.  She was on the hormone replacement after her hysterectomy but only briefly and stopped when she had the initial diagnosis of breast cancer in 1996.  SOCIAL HISTORY: (Updated May 2018) She worked as a Network engineer.  She has been widowed since 2005, her husband [name] dying after a long illness involving strokes and a myocardial infarction.  Her significant other [name] who is a retired Chief Financial Officer is now suffering from dementia and is in a skilled nursing facility. The patient lives with her daughter [name] who used to be a Network engineer for Dr. Inda Merlin, and [name]'s husband, [name], who is a physician's assistant with Dr. Durward Fortes.  The patient has 12 grandchildren and 5 great-grandchildren. Incidentally Theresa Wallace has a son in [place].    ADVANCED DIRECTIVES: In place  HEALTH MAINTENANCE: (Updated 02/26/2013) Social History   Tobacco Use  . Smoking status: Former Smoker    Last attempt to quit: 05/22/1968    Years since quitting: 49.2  . Smokeless tobacco: Never Used  Substance Use Topics  . Alcohol use: Yes  . Drug use: No     Colonoscopy:  PAP: s/p remote  hysterectomy  Bone density:  10/23/2011, normal  Lipid panel: Dr. Inda Merlin, UTD   Allergies  Allergen Reactions  . Ciprofloxacin   . Epinephrine   . Tamoxifen   . Mupirocin Rash    Current Outpatient Medications  Medication Sig Dispense Refill  . aspirin 81 MG tablet Take 81 mg by mouth daily.    . Calcium Carbonate-Vitamin D (CALCIUM-VITAMIN D) 500-200 MG-UNIT per tablet Take 1 tablet by mouth 2 (two) times daily with a meal.    . cholecalciferol (VITAMIN D) 400 UNITS TABS Take 2,000 Units by mouth daily.    Marland Kitchen esomeprazole (NEXIUM) 20 MG capsule Take 1 capsule (20 mg total) by mouth as needed.    . Glucosamine HCl-MSM (GLUCOSAMINE-MSM PO) Take 1 tablet by mouth daily.    . hydrochlorothiazide (HYDRODIURIL) 12.5 MG tablet Take 1 tablet (12.5 mg total) by mouth daily. 30 tablet 2  . levothyroxine (SYNTHROID, LEVOTHROID) 25 MCG tablet Take 25 mcg by mouth daily.    Marland Kitchen losartan (COZAAR) 50 MG tablet     .  metroNIDAZOLE (METROGEL) 0.75 % gel Apply topically 2 (two) times daily. For rosacea    . Multiple Vitamin (MULTIVITAMIN) tablet Take 1 tablet by mouth daily.    . palbociclib (IBRANCE) 100 MG capsule Take 1 capsule (100 mg total) by mouth daily with breakfast. Take for 21 days on, 7 days off, repeat every 28 days. Take whole with food. 21 capsule 6  . propranolol ER (INDERAL LA) 60 MG 24 hr capsule      No current facility-administered medications for this visit.     OBJECTIVE: Vitals:   08/27/17 1306  BP: 126/61  Pulse: 72  Resp: 18  Temp: 98.5 F (36.9 C)  SpO2: 96%   Body mass index is 27.55 kg/m.  Filed Weights   08/27/17 1306  Weight: 155 lb 8 oz (70.5 kg)  GENERAL: Patient is a well appearing female in no acute distress HEENT:  Sclerae anicteric.  Oropharynx clear and moist. No ulcerations or evidence of oropharyngeal candidiasis. Neck is supple.  NODES:  No cervical, supraclavicular, or axillary lymphadenopathy palpated.  BREAST EXAM:  Deferred. LUNGS:  Clear to  auscultation bilaterally.  No wheezes or rhonchi. HEART:  Regular rate and rhythm. No murmur appreciated. ABDOMEN:  Soft, nontender.  Positive, normoactive bowel sounds. No organomegaly palpated. MSK:  No focal spinal tenderness to palpation. Full range of motion bilaterally in the upper extremities. EXTREMITIES:  No peripheral edema.   SKIN:  Clear with no obvious rashes or skin changes. No nail dyscrasia. NEURO:  Nonfocal. Well oriented.  Appropriate affect.      LAB RESULTS: Lab Results  Component Value Date   WBC 3.2 (L) 08/27/2017   NEUTROABS 1.5 08/27/2017   HGB 10.8 (L) 08/27/2017   HCT 31.8 (L) 08/27/2017   MCV 102.9 (H) 08/27/2017   PLT 130 (L) 08/27/2017      Chemistry      Component Value Date/Time   NA 132 (L) 07/30/2017 1112   NA 129 (L) 05/07/2017 1045   K 4.7 07/30/2017 1112   K 5.0 05/07/2017 1045   CL 97 (L) 07/30/2017 1112   CL 104 10/24/2012 1014   CO2 28 07/30/2017 1112   CO2 25 05/07/2017 1045   BUN 29 (H) 07/30/2017 1112   BUN 17.6 05/07/2017 1045   CREATININE 1.27 (H) 07/30/2017 1112   CREATININE 1.70 (H) 07/09/2017 1133   CREATININE 1.1 05/07/2017 1045      Component Value Date/Time   CALCIUM 9.7 07/30/2017 1112   CALCIUM 9.3 05/07/2017 1045   ALKPHOS 32 (L) 07/30/2017 1112   ALKPHOS 37 (L) 05/07/2017 1045   AST 18 07/30/2017 1112   AST 20 05/07/2017 1045   ALT 14 07/30/2017 1112   ALT 13 05/07/2017 1045   BILITOT 1.0 07/30/2017 1112   BILITOT 1.55 (H) 05/07/2017 1045      IMAGING STUDIES: She had her most recent PET scan in November.  We will plan to repeat that late May or early June of this year.  ASSESSMENT: (1) Status post left lumpectomy in 1996 for a T1b N0 grade 2 invasive ductal carcinoma which was strongly estrogen and progesterone receptor positive, treated with radiation, then anastrozole, then Evista, all treatment discontinued July 2012  METASTATIC DISEASE: July 2012 (2) recurrence to the lining of the left lung and  liver documented by liver biopsy July 2012, showing metastatic adenocarcinoma estrogen and progesterone receptor positive at 96% and 97% respectively; there was no Her-2 amplification.   (3)  She has been on exemestane  since early July of 2012, with chest CT scan April 2013 showing complete resolution of her lung and liver metastases  (a) bone density in June of 08/09/2011 was normal  (b) bone density 03/20/2016 was normal with a T score of -0.6  (c) exemestane continued at the time of disease progression noted May 2018  (d) exemestane discontinued April 2019  (4) MRI of the lumbar spine 09-2012 showed significant degenerative disease but no evidence of metastatic disease to bone.  (5) disease progression noted May 2018: CT scan shows liver, lung, and bone lesions  (6) fulvestrant started 09/26/2016, palbociclib added 10/24/2016 at 125 mg daily, 21 days on. 7 days off  (a) dose decreased to 116m daily on 02/14/2017  (7) denosumab/Xgeva started 11/21/2016, repeated every 28 days  PLAN:  RTzipporahis doing well today.  I reviewed her CBC with her in detail.  Her ANC is 1.5, her plts are above 100. She will restart the palbociclib tomorrow at 1047mper day.  She will also proceed with the Fulvestrant and Xgeva today so long as her CMET results are within parameters.  We discussed goals of care and reviewed her Fulvestrant briefly today.  It has been about 5 months since Careli's last scan.  I have ordered a PET scan for the beginning of May for her to undergo prior to her next appointment with Dr. MaJana Hakim This plan was reviewed with Dr. MaJana Hakimn its entirety and he is in agreement.  A total of (30) minutes of face-to-face time was spent with this patient with greater than 50% of that time in counseling and care-coordination.   LiWilber BihariNP  08/27/17 1:09 PM Medical Oncology and Hematology CoMid-[facility] Hospital[address] [id number]. [phone]  Fax.  [phone]

## 2017-08-28 ENCOUNTER — Telehealth: Payer: Self-pay | Admitting: Adult Health

## 2017-08-28 LAB — CANCER ANTIGEN 27.29: CA 27.29: 127 U/mL — ABNORMAL HIGH (ref 0.0–38.6)

## 2017-08-28 NOTE — Telephone Encounter (Signed)
Per 4/8 no los °

## 2017-09-14 MED FILL — IBRANCE 100 MG CAPSULE: 100 | 28 days supply | Qty: 21 | Fill #3

## 2017-09-17 NOTE — Progress Notes (Signed)
ID: Theresa Wallace   DOB: [birth year]  MR#: [id number]  ZHG#:[id number]  PCP: [name], MD GYN: SU:  OTHER MD:  CHIEF COMPLAINT:  Metastatic Breast Cancer, estrogen receptor positive  CURRENT TREATMENT: fulvestrant, palbociclib, Delton See   INTERVAL HISTORY: Theresa Wallace returns today for follow-up and treatment of her estrogen receptor positive breast cancer, accompanied by her daughter. She continues on fulvestrant with a dose due today. She tolerates this well.   She receives monthly Xgeva. She tolerates this well.   She is also on palbociclib, at 100 mg daily, 21 days on, 7 days off. She starts her next 21 day cycle tomorrow. She is very fatigued, though it is not clear whether this is related to the palbociclib or not.  We have just restaged her with a PET scan 09/19/2017 which is essentially stable.  (Details below).   REVIEW OF SYSTEMS: [name] reports that she was nervous after having a PET scan. She is doing well overall.  Throughout the day, she falls asleep within 10 minutes of sitting down. She also falls asleep while watching her favorite show, [place]. She is not drinking enough water. She denies unusual headaches, visual changes, nausea, vomiting, or dizziness. There has been no unusual cough, phlegm production, or pleurisy. This been no change in bowel or bladder habits. She denies unexplained fatigue or unexplained weight loss, bleeding, rash, or fever. A detailed review of systems was otherwise stable.   HISTORY OF PRESENT ILLNESS: From the prior summary:  Theresa Wallace is an 84 y.o.  [place] woman I saw briefly when she moved to this area in 2007.  She had had a left breast cancer removed at [facility] in [place] in [phone] ([place]).  It was grade 2, measured 1 cm maximally and was strongly estrogen and progesterone-receptor positive.  HER-2 was not checked.  This was T1b NX invasive ductal carcinoma, stage I, and she was treated after radiation with  anastrozole for several years, eventually switching to Evista primarily for cost reasons.   More recently, she presented to Dr. Inda Merlin with fatigue and a dry cough.  He auscultated dullness to percussion at the left base and obtained a chest x-ray which confirmed the presence of a left pleural effusion.   Dr. Inda Merlin scheduled Theresa Wallace for an ultrasound-guided left thoracentesis, and this was performed November 09, 2010.  Approximately 1 L of amber-colored fluid was removed.  Cytology from this procedure ([id number]) showed only reactive mesothelial cells.   The patient was then referred here and set up for a CT of the chest and a PET scan.  The CT of the chest on June 20th showed 2 slightly enlarged left axillary lymph nodes with some irregular contours but more importantly multiple pleural nodules in the left chest measuring up to 4.6 cm.  The right chest was normal.  There was no pericardial effusion.  There was only a small to moderate left pleural effusion remaining, and aside from left lower lobe collapse or consolidation, there was no evidence of lung parenchymal involvement.  There was no evidence of bone involvement and also no calcified pleural plaques and no worrisome bone involvement.  PET scan on June 27th showed the nodularity along the left pleura to be intensely hypermetabolic.  For example the large lobe nodule measuring 4.5 cm had an SUV max of 12.9.  There was no hypermetabolic mediastinal nodal activity.  The to left axillary lymph nodes were very mildly hypermetabolic.  The left breast was clear.  There  was 1 hypermetabolic lesion in the liver measuring 3.2 cm.  On June 27th, Dr. [name] performed fine-needle aspiration of a 7 mm abnormal-appearing lymph node in the lower left axilla.  The cytology from this procedure, however, ([name]) also was inconclusive showing no evidence of nodal tissue, mostly blood and fatty tissue.   Her subsequent history is as detailed below   PAST MEDICAL  HISTORY: Past Medical History:  Diagnosis Date  . Breast cancer metastasized to multiple sites ([place]) 06/07/2011  . Cancer ([place])    breast ca  . Hypertension   1. Hypercholesterolemia. 2. Benign tremor. 3. Sigmoid diverticulosis. 4. Onychomycosis. 5. Irritable bowel syndrome. 6. GERD. 7. History of palpitations with Holter monitor in 2008 showing PACs. 8. History of hysterectomy with no salpingo-oophorectomy. 9. History of cholecystectomy. 10. History of bilateral knee arthroscopic surgery. History of hemorrhoid surgery.  FAMILY HISTORY [name] died in an automobile accident.  [name] died with "bone cancer."  The patient had 3 sisters.  One had breast cancer develop in her 53s.  There is also a cousin with breast cancer but no history of ovarian cancer or other history of cancer in first-degree relatives.  GYNECOLOGIC HISTORY: She is GX, P4, first pregnancy to term at age 41.  She was on the hormone replacement after her hysterectomy but only briefly and stopped when she had the initial diagnosis of breast cancer in 1996.  SOCIAL HISTORY: (Updated May 2018) She worked as a Network engineer.  She has been widowed since 2005, her husband [name] dying after a long illness involving strokes and a myocardial infarction.  Her significant other Theresa Wallace who is a retired Chief Financial Officer is now suffering from dementia and is in a skilled nursing facility. The patient lives with her daughter Theresa Wallace who used to be a Network engineer for Dr. Inda Merlin, and Theresa Wallace's husband, Theresa Wallace, who is a physician's assistant with Dr. Durward Fortes.  The patient has 12 grandchildren and 5 great-grandchildren. Incidentally Theresa Wallace has a son in [place].    ADVANCED DIRECTIVES: In place  HEALTH MAINTENANCE: (Updated 02/26/2013) Social History   Tobacco Use  . Smoking status: Former Smoker    Last attempt to quit: 05/22/1968    Years since quitting: 49.3  . Smokeless tobacco: Never Used  Substance  Use Topics  . Alcohol use: Yes  . Drug use: No     Colonoscopy:  PAP: s/p remote hysterectomy  Bone density:  10/23/2011, normal  Lipid panel: Dr. Inda Merlin, UTD   Allergies  Allergen Reactions  . Ciprofloxacin   . Epinephrine   . Tamoxifen   . Mupirocin Rash    Current Outpatient Medications  Medication Sig Dispense Refill  . aspirin 81 MG tablet Take 81 mg by mouth daily.    . Calcium Carbonate-Vitamin D (CALCIUM-VITAMIN D) 500-200 MG-UNIT per tablet Take 1 tablet by mouth 2 (two) times daily with a meal.    . cholecalciferol (VITAMIN D) 400 UNITS TABS Take 2,000 Units by mouth daily.    Marland Kitchen esomeprazole (NEXIUM) 20 MG capsule Take 1 capsule (20 mg total) by mouth as needed.    . Glucosamine HCl-MSM (GLUCOSAMINE-MSM PO) Take 1 tablet by mouth daily.    . hydrochlorothiazide (HYDRODIURIL) 12.5 MG tablet Take 1 tablet (12.5 mg total) by mouth daily. 30 tablet 2  . levothyroxine (SYNTHROID, LEVOTHROID) 25 MCG tablet Take 25 mcg by mouth daily.    Marland Kitchen losartan (COZAAR) 50 MG tablet     . metroNIDAZOLE (METROGEL) 0.75 % gel Apply  topically 2 (two) times daily. For rosacea    . Multiple Vitamin (MULTIVITAMIN) tablet Take 1 tablet by mouth daily.    . palbociclib (IBRANCE) 100 MG capsule Take 1 capsule (100 mg total) by mouth daily with breakfast. Take for 21 days on, 7 days off, repeat every 28 days. Take whole with food. 21 capsule 6  . propranolol ER (INDERAL LA) 60 MG 24 hr capsule      No current facility-administered medications for this visit.     OBJECTIVE: Older white woman in no acute distress Vitals:   09/24/17 1117  BP: (!) 125/42  Pulse: 74  Resp: 18  Temp: 98.5 F (36.9 C)  SpO2: 97%   Body mass index is 27.86 kg/m.  Filed Weights   09/24/17 1117  Weight: 157 lb 4.8 oz (71.4 kg)   Sclerae unicteric, EOMs intact No cervical or supraclavicular adenopathy Lungs no rales or rhonchi Heart regular rate and rhythm Abd soft, nontender, positive bowel sounds MSK no  focal spinal tenderness, no upper extremity lymphedema Neuro: nonfocal, well oriented, appropriate affect Breasts: I do not palpate a suspicious mass in either breast.  Both axillae are benign     LAB RESULTS: Lab Results  Component Value Date   WBC 3.5 (L) 09/24/2017   NEUTROABS 2.0 09/24/2017   HGB 10.9 (L) 09/24/2017   HCT 31.5 (L) 09/24/2017   MCV 102.2 (H) 09/24/2017   PLT 158 09/24/2017      Chemistry      Component Value Date/Time   NA 138 09/24/2017 1053   NA 129 (L) 05/07/2017 1045   K 4.5 09/24/2017 1053   K 5.0 05/07/2017 1045   CL 103 09/24/2017 1053   CL 104 10/24/2012 1014   CO2 28 09/24/2017 1053   CO2 25 05/07/2017 1045   BUN 37 (H) 09/24/2017 1053   BUN 17.6 05/07/2017 1045   CREATININE 1.59 (H) 09/24/2017 1053   CREATININE 1.70 (H) 07/09/2017 1133   CREATININE 1.1 05/07/2017 1045      Component Value Date/Time   CALCIUM 10.0 09/24/2017 1053   CALCIUM 9.3 05/07/2017 1045   ALKPHOS 38 (L) 09/24/2017 1053   ALKPHOS 37 (L) 05/07/2017 1045   AST 19 09/24/2017 1053   AST 20 05/07/2017 1045   ALT 14 09/24/2017 1053   ALT 13 05/07/2017 1045   BILITOT 0.8 09/24/2017 1053   BILITOT 1.55 (H) 05/07/2017 1045      IMAGING STUDIES: Nm Pet Image Restag (ps) Skull Base To Thigh  Result Date: 09/19/2017 CLINICAL DATA:  Subsequent treatment strategy for restaging of left-sided breast cancer. EXAM: NUCLEAR MEDICINE PET SKULL BASE TO THIGH TECHNIQUE: 8.2 mCi F-18 FDG was injected intravenously. Full-ring PET imaging was performed from the skull base to thigh after the radiotracer. CT data was obtained and used for attenuation correction and anatomic localization. Fasting blood glucose: 109 mg/dl COMPARISON:  04/02/2017 FINDINGS: Mediastinal blood pool activity: SUV max 3.3 NECK: No areas of abnormal hypermetabolism. Incidental CT findings: Right maxillary sinus mucous retention cyst or polyp. No cervical adenopathy. Right carotid atherosclerosis. CHEST: Left axillary  node measures 1.2 cm and a S.U.V. max of 5.8 on image 65/4. Compare 1.7 cm and a S.U.V. max of 8.6 on the prior. Right middle lobe pulmonary nodule measures 10 mm and a S.U.V. max of 6.2 today on image 88/4 versus 10 mm and a S.U.V. max of 4.8 on the prior exam. Hypermetabolic right-sided thyroid nodule at a S.U.V. max of 5.6. Incidental CT findings:  Aortic atherosclerosis. Mild cardiomegaly. Lad and right coronary artery atherosclerosis. Tiny hiatal hernia. ABDOMEN/PELVIS: Hepatic dome vague hypoattenuating mass measures on the order of 3.6 cm and a S.U.V. max of 5.8 today versus 3.7 cm and a S.U.V. max of 6.7 on the prior exam. More posterior right hepatic dome hypoattenuation measures 5.0 cm and a S.U.V. max of 4.2 on image 96/4 versus 4.4 cm and a S.U.V. max of 4.3 on the prior. Incidental CT findings: Abdominal aortic atherosclerosis. Cholecystectomy. Normal adrenal glands. Hysterectomy. Pelvic floor laxity. SKELETON: The previously described right sacral hypermetabolism is no longer identified. Left T11 vertebral hypermetabolism is also no longer identified. Incidental CT findings: Degenerate changes of both hips. IMPRESSION: 1. Relatively similar disease burden compared to 04/02/2017. 2. Decrease in size and hypermetabolism of left axillary node and a more anterior hepatic dome lesion. Resolution of osseous hypermetabolism. Increase in hypermetabolism involving a right middle lobe pulmonary nodule and more posterior right hepatic dome lesion. 3. No new sites of disease. 4. Hypermetabolic right thyroid nodule is of questionable clinical significance given patient age and comorbidities. 5. Coronary artery atherosclerosis. Aortic Atherosclerosis (ICD10-I70.0). Electronically Signed   By: Abigail Miyamoto M.D.   On: 09/19/2017 11:55    ASSESSMENT: (1) Status post left lumpectomy in 1996 for a T1b N0 grade 2 invasive ductal carcinoma which was strongly estrogen and progesterone receptor positive, treated with  radiation, then anastrozole, then Evista, all treatment discontinued July 2012  METASTATIC DISEASE: July 2012 (2) recurrence to the lining of the left lung and liver documented by liver biopsy July 2012, showing metastatic adenocarcinoma estrogen and progesterone receptor positive at 96% and 97% respectively; there was no Her-2 amplification.   (3)  She has been on exemestane since early July of 2012, with chest CT scan April 2013 showing complete resolution of her lung and liver metastases  (a) bone density in June of 08/09/2011 was normal  (b) bone density 03/20/2016 was normal with a T score of -0.6  (c) exemestane continued at the time of disease progression noted May 2018  (d) exemestane discontinued April 2019  (4) MRI of the lumbar spine 09-2012 showed significant degenerative disease but no evidence of metastatic disease to bone.  (5) disease progression noted May 2018: CT scan shows liver, lung, and bone lesions  (6) fulvestrant started 09/26/2016, palbociclib added 10/24/2016 at 125 mg daily, 21 days on. 7 days off  (a) dose decreased to 114m daily on 02/14/2017  (7) denosumab/Xgeva started 11/21/2016, repeated every 28 days  PLAN:  REmmilyis now 7 years out from definitive diagnosis of metastatic breast cancer, with very well controlled disease.  This is very favorable.  We reviewed the PET scan results in detail.  It does show a thyroid nodule of uncertain significance.  At this point we are going to keep that in mind but not proceed to ultrasound or biopsy.  She is tolerating her medication quite well and I am not making a change in her palbociclib dose.  I think the fatigue she is experiencing may be due to blood pressure issues since her blood pressure today was on the low side.  We are cutting the losartan to 25 mg.  Her daughter will check her blood pressure at home to make sure it is not too low.  We are leaving the hydrochlorothiazide alone.  The plan is to continue the  fulvestrant and Xgeva monthly until the July visit.  From that point though we are [facility]  to [facility].  We will continue to follow the lab work on a monthly basis to make sure the palbociclib dose does not need to be adjusted  She knows to call for any other issues that may develop before the next visit.   Magrinat, Virgie Dad, MD  09/24/17 11:55 AM Medical Oncology and Hematology [facility] [address] [place], [place] [address] Tel. [phone]    Fax. [phone]  This document serves as a record of services personally performed by Lurline Del, MD. It was created on his behalf by Sheron Nightingale, a trained medical scribe. The creation of this record is based on the scribe's personal observations and the provider's statements to them.   I have reviewed the above documentation for accuracy and completeness, and I agree with the above.

## 2017-09-19 ENCOUNTER — Encounter (HOSPITAL_COMMUNITY)
Admission: RE | Admit: 2017-09-19 | Discharge: 2017-09-19 | Disposition: A | Discharge status: Home / Self Care | Payer: Medicare Other | Source: Ambulatory Visit | Attending: Adult Health | Admitting: Adult Health

## 2017-09-19 ENCOUNTER — Encounter (HOSPITAL_COMMUNITY): Payer: Self-pay | Admitting: Radiology

## 2017-09-19 DIAGNOSIS — Z170 Estrogen receptor positive status [ER+]: Secondary | ICD-10-CM | POA: Insufficient documentation

## 2017-09-19 DIAGNOSIS — C50812 Malignant neoplasm of overlapping sites of left female breast: Secondary | ICD-10-CM | POA: Insufficient documentation

## 2017-09-19 LAB — GLUCOSE, CAPILLARY: Glucose-Capillary: 109 mg/dL — ABNORMAL HIGH (ref 65–99)

## 2017-09-19 MED ORDER — FLUDEOXYGLUCOSE F - 18 (FDG) INJECTION
8.2000 | Freq: Once | INTRAVENOUS | Status: AC | PRN
  Administered 2017-09-19: 8.2 via INTRAVENOUS

## 2017-09-24 ENCOUNTER — Inpatient Hospital Stay: Payer: Medicare Other

## 2017-09-24 ENCOUNTER — Telehealth: Payer: Self-pay | Admitting: Oncology

## 2017-09-24 ENCOUNTER — Inpatient Hospital Stay: Payer: Medicare Other | Admitting: Oncology

## 2017-09-24 ENCOUNTER — Inpatient Hospital Stay: Payer: Medicare Other | Attending: Adult Health

## 2017-09-24 VITALS — BP 125/42 | HR 74 | Temp 98.5°F | Resp 18 | Ht 63.0 in | Wt 157.3 lb

## 2017-09-24 DIAGNOSIS — Z7982 Long term (current) use of aspirin: Secondary | ICD-10-CM | POA: Insufficient documentation

## 2017-09-24 DIAGNOSIS — K219 Gastro-esophageal reflux disease without esophagitis: Secondary | ICD-10-CM | POA: Diagnosis not present

## 2017-09-24 DIAGNOSIS — E041 Nontoxic single thyroid nodule: Secondary | ICD-10-CM | POA: Diagnosis not present

## 2017-09-24 DIAGNOSIS — C50812 Malignant neoplasm of overlapping sites of left female breast: Secondary | ICD-10-CM

## 2017-09-24 DIAGNOSIS — C7800 Secondary malignant neoplasm of unspecified lung: Secondary | ICD-10-CM | POA: Diagnosis not present

## 2017-09-24 DIAGNOSIS — C50922 Malignant neoplasm of unspecified site of left male breast: Secondary | ICD-10-CM

## 2017-09-24 DIAGNOSIS — C50919 Malignant neoplasm of unspecified site of unspecified female breast: Secondary | ICD-10-CM

## 2017-09-24 DIAGNOSIS — I700 Atherosclerosis of aorta: Secondary | ICD-10-CM | POA: Insufficient documentation

## 2017-09-24 DIAGNOSIS — Z79818 Long term (current) use of other agents affecting estrogen receptors and estrogen levels: Secondary | ICD-10-CM | POA: Diagnosis not present

## 2017-09-24 DIAGNOSIS — Z803 Family history of malignant neoplasm of breast: Secondary | ICD-10-CM | POA: Insufficient documentation

## 2017-09-24 DIAGNOSIS — Z9049 Acquired absence of other specified parts of digestive tract: Secondary | ICD-10-CM | POA: Diagnosis not present

## 2017-09-24 DIAGNOSIS — Z90710 Acquired absence of both cervix and uterus: Secondary | ICD-10-CM | POA: Insufficient documentation

## 2017-09-24 DIAGNOSIS — J90 Pleural effusion, not elsewhere classified: Secondary | ICD-10-CM | POA: Diagnosis not present

## 2017-09-24 DIAGNOSIS — C787 Secondary malignant neoplasm of liver and intrahepatic bile duct: Secondary | ICD-10-CM | POA: Insufficient documentation

## 2017-09-24 DIAGNOSIS — Z79899 Other long term (current) drug therapy: Secondary | ICD-10-CM | POA: Diagnosis not present

## 2017-09-24 DIAGNOSIS — C50012 Malignant neoplasm of nipple and areola, left female breast: Secondary | ICD-10-CM | POA: Diagnosis not present

## 2017-09-24 DIAGNOSIS — Z170 Estrogen receptor positive status [ER+]: Secondary | ICD-10-CM

## 2017-09-24 DIAGNOSIS — Z8719 Personal history of other diseases of the digestive system: Secondary | ICD-10-CM | POA: Diagnosis not present

## 2017-09-24 DIAGNOSIS — I6521 Occlusion and stenosis of right carotid artery: Secondary | ICD-10-CM | POA: Diagnosis not present

## 2017-09-24 DIAGNOSIS — E7800 Pure hypercholesterolemia, unspecified: Secondary | ICD-10-CM | POA: Diagnosis not present

## 2017-09-24 DIAGNOSIS — J410 Simple chronic bronchitis: Secondary | ICD-10-CM

## 2017-09-24 DIAGNOSIS — C7951 Secondary malignant neoplasm of bone: Secondary | ICD-10-CM | POA: Diagnosis not present

## 2017-09-24 DIAGNOSIS — I10 Essential (primary) hypertension: Secondary | ICD-10-CM | POA: Diagnosis not present

## 2017-09-24 DIAGNOSIS — K589 Irritable bowel syndrome without diarrhea: Secondary | ICD-10-CM | POA: Insufficient documentation

## 2017-09-24 DIAGNOSIS — Z87891 Personal history of nicotine dependence: Secondary | ICD-10-CM | POA: Insufficient documentation

## 2017-09-24 DIAGNOSIS — C50912 Malignant neoplasm of unspecified site of left female breast: Secondary | ICD-10-CM

## 2017-09-24 LAB — CBC WITH DIFFERENTIAL/PLATELET
BASOS PCT: 2 %
Basophils Absolute: 0.1 10*3/uL (ref 0.0–0.1)
Eosinophils Absolute: 0.1 10*3/uL (ref 0.0–0.5)
Eosinophils Relative: 3 %
HCT: 31.5 % — ABNORMAL LOW (ref 34.8–46.6)
HEMOGLOBIN: 10.9 g/dL — AB (ref 11.6–15.9)
LYMPHS ABS: 1 10*3/uL (ref 0.9–3.3)
Lymphocytes Relative: 27 %
MCH: 35.5 pg — AB (ref 25.1–34.0)
MCHC: 34.8 g/dL (ref 31.5–36.0)
MCV: 102.2 fL — ABNORMAL HIGH (ref 79.5–101.0)
MONO ABS: 0.4 10*3/uL (ref 0.1–0.9)
MONOS PCT: 13 %
NEUTROS PCT: 55 %
Neutro Abs: 2 10*3/uL (ref 1.5–6.5)
Platelets: 158 10*3/uL (ref 145–400)
RBC: 3.08 MIL/uL — ABNORMAL LOW (ref 3.70–5.45)
RDW: 14.2 % (ref 11.2–14.5)
WBC: 3.5 10*3/uL — ABNORMAL LOW (ref 3.9–10.3)

## 2017-09-24 LAB — COMPREHENSIVE METABOLIC PANEL
ALK PHOS: 38 U/L — AB (ref 40–150)
ALT: 14 U/L (ref 0–55)
ANION GAP: 7 (ref 3–11)
AST: 19 U/L (ref 5–34)
Albumin: 4.1 g/dL (ref 3.5–5.0)
BUN: 37 mg/dL — ABNORMAL HIGH (ref 7–26)
CO2: 28 mmol/L (ref 22–29)
Calcium: 10 mg/dL (ref 8.4–10.4)
Chloride: 103 mmol/L (ref 98–109)
Creatinine, Ser: 1.59 mg/dL — ABNORMAL HIGH (ref 0.60–1.10)
GFR calc non Af Amer: 27 mL/min — ABNORMAL LOW (ref >60)
GFR, EST AFRICAN AMERICAN: 31 mL/min — AB (ref >60)
Glucose, Bld: 96 mg/dL (ref 70–140)
POTASSIUM: 4.5 mmol/L (ref 3.5–5.1)
SODIUM: 138 mmol/L (ref 136–145)
TOTAL PROTEIN: 6.8 g/dL (ref 6.4–8.3)
Total Bilirubin: 0.8 mg/dL (ref 0.2–1.2)

## 2017-09-24 MED ORDER — DENOSUMAB 120 MG/1.7ML ~~LOC~~ SOLN
120.0000 mg | Freq: Once | SUBCUTANEOUS | Status: AC
  Administered 2017-09-24: 120 mg via SUBCUTANEOUS

## 2017-09-24 MED ORDER — FULVESTRANT 250 MG/5ML IM SOLN
INTRAMUSCULAR | Status: AC
  Filled 2017-09-24: qty 10

## 2017-09-24 MED ORDER — DENOSUMAB 120 MG/1.7ML ~~LOC~~ SOLN
SUBCUTANEOUS | Status: AC
  Filled 2017-09-24: qty 1.7

## 2017-09-24 MED ORDER — FULVESTRANT 250 MG/5ML IM SOLN
500.0000 mg | Freq: Once | INTRAMUSCULAR | Status: AC
  Administered 2017-09-24: 500 mg via INTRAMUSCULAR

## 2017-09-24 NOTE — Patient Instructions (Signed)

## 2017-09-24 NOTE — Telephone Encounter (Signed)
Per 5/6 patient decline avs and calendar

## 2017-09-25 LAB — CANCER ANTIGEN 27.29: CA 27.29: 128.3 U/mL — ABNORMAL HIGH (ref 0.0–38.6)

## 2017-09-26 ENCOUNTER — Other Ambulatory Visit: Payer: Self-pay | Admitting: Adult Health

## 2017-09-26 DIAGNOSIS — E875 Hyperkalemia: Secondary | ICD-10-CM

## 2017-10-12 MED FILL — IBRANCE 100 MG CAPSULE: 100 | 28 days supply | Qty: 21 | Fill #4

## 2017-10-22 ENCOUNTER — Inpatient Hospital Stay: Payer: Medicare Other | Attending: Adult Health

## 2017-10-22 ENCOUNTER — Inpatient Hospital Stay: Payer: Medicare Other

## 2017-10-22 DIAGNOSIS — Z79818 Long term (current) use of other agents affecting estrogen receptors and estrogen levels: Secondary | ICD-10-CM | POA: Insufficient documentation

## 2017-10-22 DIAGNOSIS — C50912 Malignant neoplasm of unspecified site of left female breast: Secondary | ICD-10-CM

## 2017-10-22 DIAGNOSIS — C7951 Secondary malignant neoplasm of bone: Secondary | ICD-10-CM | POA: Diagnosis not present

## 2017-10-22 DIAGNOSIS — C50812 Malignant neoplasm of overlapping sites of left female breast: Secondary | ICD-10-CM | POA: Diagnosis not present

## 2017-10-22 DIAGNOSIS — C50919 Malignant neoplasm of unspecified site of unspecified female breast: Secondary | ICD-10-CM

## 2017-10-22 DIAGNOSIS — J410 Simple chronic bronchitis: Secondary | ICD-10-CM

## 2017-10-22 DIAGNOSIS — Z170 Estrogen receptor positive status [ER+]: Principal | ICD-10-CM

## 2017-10-22 DIAGNOSIS — C50922 Malignant neoplasm of unspecified site of left male breast: Secondary | ICD-10-CM

## 2017-10-22 DIAGNOSIS — Z79899 Other long term (current) drug therapy: Secondary | ICD-10-CM | POA: Diagnosis not present

## 2017-10-22 LAB — CBC WITH DIFFERENTIAL/PLATELET
BASOS ABS: 0.1 10*3/uL (ref 0.0–0.1)
BASOS PCT: 2 %
EOS ABS: 0.1 10*3/uL (ref 0.0–0.5)
EOS PCT: 2 %
HCT: 30 % — ABNORMAL LOW (ref 34.8–46.6)
Hemoglobin: 10.5 g/dL — ABNORMAL LOW (ref 11.6–15.9)
Lymphocytes Relative: 31 %
Lymphs Abs: 1 10*3/uL (ref 0.9–3.3)
MCH: 35.4 pg — ABNORMAL HIGH (ref 25.1–34.0)
MCHC: 34.9 g/dL (ref 31.5–36.0)
MCV: 101.4 fL — ABNORMAL HIGH (ref 79.5–101.0)
Monocytes Absolute: 0.4 10*3/uL (ref 0.1–0.9)
Monocytes Relative: 14 %
Neutro Abs: 1.6 10*3/uL (ref 1.5–6.5)
Neutrophils Relative %: 51 %
Platelets: 135 10*3/uL — ABNORMAL LOW (ref 145–400)
RBC: 2.96 MIL/uL — AB (ref 3.70–5.45)
RDW: 13.7 % (ref 11.2–14.5)
WBC: 3.2 10*3/uL — ABNORMAL LOW (ref 3.9–10.3)

## 2017-10-22 LAB — COMPREHENSIVE METABOLIC PANEL
ALT: 12 U/L (ref 0–55)
AST: 21 U/L (ref 5–34)
Albumin: 4.1 g/dL (ref 3.5–5.0)
Alkaline Phosphatase: 36 U/L — ABNORMAL LOW (ref 40–150)
Anion gap: 7 (ref 3–11)
BILIRUBIN TOTAL: 0.8 mg/dL (ref 0.2–1.2)
BUN: 31 mg/dL — AB (ref 7–26)
CO2: 26 mmol/L (ref 22–29)
CREATININE: 1.41 mg/dL — AB (ref 0.60–1.10)
Calcium: 9.4 mg/dL (ref 8.4–10.4)
Chloride: 103 mmol/L (ref 98–109)
GFR calc Af Amer: 36 mL/min — ABNORMAL LOW (ref >60)
GFR calc non Af Amer: 31 mL/min — ABNORMAL LOW (ref >60)
Glucose, Bld: 105 mg/dL (ref 70–140)
Potassium: 5.4 mmol/L — ABNORMAL HIGH (ref 3.5–5.1)
Sodium: 136 mmol/L (ref 136–145)
TOTAL PROTEIN: 6.7 g/dL (ref 6.4–8.3)

## 2017-10-22 MED ORDER — FULVESTRANT 250 MG/5ML IM SOLN
500.0000 mg | Freq: Once | INTRAMUSCULAR | Status: AC
  Administered 2017-10-22: 500 mg via INTRAMUSCULAR

## 2017-10-22 MED ORDER — FULVESTRANT 250 MG/5ML IM SOLN
INTRAMUSCULAR | Status: AC
  Filled 2017-10-22: qty 10

## 2017-10-22 MED ORDER — DENOSUMAB 120 MG/1.7ML ~~LOC~~ SOLN
120.0000 mg | Freq: Once | SUBCUTANEOUS | Status: AC
  Administered 2017-10-22: 120 mg via SUBCUTANEOUS

## 2017-10-22 NOTE — Patient Instructions (Signed)
Fulvestrant injection What is this medicine? FULVESTRANT (ful VES trant) blocks the effects of estrogen. It is used to treat breast cancer. This medicine may be used for other purposes; ask your health care provider or pharmacist if you have questions. COMMON BRAND NAME(S): FASLODEX What should I tell my health care provider before I take this medicine? They need to know if you have any of these conditions: -bleeding problems -liver disease -low levels of platelets in the blood -an unusual or allergic reaction to fulvestrant, other medicines, foods, dyes, or preservatives -pregnant or trying to get pregnant -breast-feeding How should I use this medicine? This medicine is for injection into a muscle. It is usually given by a health care professional in a hospital or clinic setting. Talk to your pediatrician regarding the use of this medicine in children. Special care may be needed. Overdosage: If you think you have taken too much of this medicine contact a poison control center or emergency room at once. NOTE: This medicine is only for you. Do not share this medicine with others. What if I miss a dose? It is important not to miss your dose. Call your doctor or health care professional if you are unable to keep an appointment. What may interact with this medicine? -medicines that treat or prevent blood clots like warfarin, enoxaparin, and dalteparin This list may not describe all possible interactions. Give your health care provider a list of all the medicines, herbs, non-prescription drugs, or dietary supplements you use. Also tell them if you smoke, drink alcohol, or use illegal drugs. Some items may interact with your medicine. What should I watch for while using this medicine? Your condition will be monitored carefully while you are receiving this medicine. You will need important blood work done while you are taking this medicine. Do not become pregnant while taking this medicine or for  at least 1 year after stopping it. Women of child-bearing potential will need to have a negative pregnancy test before starting this medicine. Women should inform their doctor if they wish to become pregnant or think they might be pregnant. There is a potential for serious side effects to an unborn child. Men should inform their doctors if they wish to father a child. This medicine may lower sperm counts. Talk to your health care professional or pharmacist for more information. Do not breast-feed an infant while taking this medicine or for 1 year after the last dose. What side effects may I notice from receiving this medicine? Side effects that you should report to your doctor or health care professional as soon as possible: -allergic reactions like skin rash, itching or hives, swelling of the face, lips, or tongue -feeling faint or lightheaded, falls -pain, tingling, numbness, or weakness in the legs -signs and symptoms of infection like fever or chills; cough; flu-like symptoms; sore throat -vaginal bleeding Side effects that usually do not require medical attention (report to your doctor or health care professional if they continue or are bothersome): -aches, pains -constipation -diarrhea -headache -hot flashes -nausea, vomiting -pain at site where injected -stomach pain This list may not describe all possible side effects. Call your doctor for medical advice about side effects. You may report side effects to FDA at 1-800-FDA-1088. Where should I keep my medicine? This drug is given in a hospital or clinic and will not be stored at home. NOTE: This sheet is a summary. It may not cover all possible information. If you have questions about this medicine, talk to your   doctor, pharmacist, or health care provider.  2018 Elsevier/Gold Standard (2014-12-04 11:03:55) Denosumab injection What is this medicine? DENOSUMAB (den oh sue mab) slows bone breakdown. Prolia is used to treat osteoporosis in  women after menopause and in men. Delton See is used to treat a high calcium level due to cancer and to prevent bone fractures and other bone problems caused by multiple myeloma or cancer bone metastases. Delton See is also used to treat giant cell tumor of the bone. This medicine may be used for other purposes; ask your health care provider or pharmacist if you have questions. COMMON BRAND NAME(S): Prolia, XGEVA What should I tell my health care provider before I take this medicine? They need to know if you have any of these conditions: -dental disease -having surgery or tooth extraction -infection -kidney disease -low levels of calcium or Vitamin D in the blood -malnutrition -on hemodialysis -skin conditions or sensitivity -thyroid or parathyroid disease -an unusual reaction to denosumab, other medicines, foods, dyes, or preservatives -pregnant or trying to get pregnant -breast-feeding How should I use this medicine? This medicine is for injection under the skin. It is given by a health care professional in a hospital or clinic setting. If you are getting Prolia, a special MedGuide will be given to you by the pharmacist with each prescription and refill. Be sure to read this information carefully each time. For Prolia, talk to your pediatrician regarding the use of this medicine in children. Special care may be needed. For Delton See, talk to your pediatrician regarding the use of this medicine in children. While this drug may be prescribed for children as young as 13 years for selected conditions, precautions do apply. Overdosage: If you think you have taken too much of this medicine contact a poison control center or emergency room at once. NOTE: This medicine is only for you. Do not share this medicine with others. What if I miss a dose? It is important not to miss your dose. Call your doctor or health care professional if you are unable to keep an appointment. What may interact with this  medicine? Do not take this medicine with any of the following medications: -other medicines containing denosumab This medicine may also interact with the following medications: -medicines that lower your chance of fighting infection -steroid medicines like prednisone or cortisone This list may not describe all possible interactions. Give your health care provider a list of all the medicines, herbs, non-prescription drugs, or dietary supplements you use. Also tell them if you smoke, drink alcohol, or use illegal drugs. Some items may interact with your medicine. What should I watch for while using this medicine? Visit your doctor or health care professional for regular checks on your progress. Your doctor or health care professional may order blood tests and other tests to see how you are doing. Call your doctor or health care professional for advice if you get a fever, chills or sore throat, or other symptoms of a cold or flu. Do not treat yourself. This drug may decrease your body's ability to fight infection. Try to avoid being around people who are sick. You should make sure you get enough calcium and vitamin D while you are taking this medicine, unless your doctor tells you not to. Discuss the foods you eat and the vitamins you take with your health care professional. See your dentist regularly. Brush and floss your teeth as directed. Before you have any dental work done, tell your dentist you are receiving this medicine. Do  not become pregnant while taking this medicine or for 5 months after stopping it. Talk with your doctor or health care professional about your birth control options while taking this medicine. Women should inform their doctor if they wish to become pregnant or think they might be pregnant. There is a potential for serious side effects to an unborn child. Talk to your health care professional or pharmacist for more information. What side effects may I notice from receiving this  medicine? Side effects that you should report to your doctor or health care professional as soon as possible: -allergic reactions like skin rash, itching or hives, swelling of the face, lips, or tongue -bone pain -breathing problems -dizziness -jaw pain, especially after dental work -redness, blistering, peeling of the skin -signs and symptoms of infection like fever or chills; cough; sore throat; pain or trouble passing urine -signs of low calcium like fast heartbeat, muscle cramps or muscle pain; pain, tingling, numbness in the hands or feet; seizures -unusual bleeding or bruising -unusually weak or tired Side effects that usually do not require medical attention (report to your doctor or health care professional if they continue or are bothersome): -constipation -diarrhea -headache -joint pain -loss of appetite -muscle pain -runny nose -tiredness -upset stomach This list may not describe all possible side effects. Call your doctor for medical advice about side effects. You may report side effects to FDA at 1-800-FDA-1088. Where should I keep my medicine? This medicine is only given in a clinic, doctor's office, or other health care setting and will not be stored at home. NOTE: This sheet is a summary. It may not cover all possible information. If you have questions about this medicine, talk to your doctor, pharmacist, or health care provider.  2018 Elsevier/Gold Standard (2016-05-30 19:17:21)  

## 2017-10-23 LAB — CANCER ANTIGEN 27.29: CAN 27.29: 116.2 U/mL — AB (ref 0.0–38.6)

## 2017-11-13 MED FILL — IBRANCE 100 MG CAPSULE: 100 | 28 days supply | Qty: 21 | Fill #5

## 2017-11-19 ENCOUNTER — Encounter: Payer: Self-pay | Admitting: Adult Health

## 2017-11-19 ENCOUNTER — Ambulatory Visit: Payer: Medicare Other

## 2017-11-19 ENCOUNTER — Other Ambulatory Visit: Payer: Medicare Other

## 2017-11-19 ENCOUNTER — Inpatient Hospital Stay: Payer: Medicare Other

## 2017-11-19 ENCOUNTER — Telehealth: Payer: Self-pay | Admitting: Oncology

## 2017-11-19 ENCOUNTER — Inpatient Hospital Stay: Payer: Medicare Other | Attending: Adult Health | Admitting: Adult Health

## 2017-11-19 VITALS — BP 153/57 | HR 65 | Temp 98.6°F | Resp 17 | Ht 63.0 in | Wt 158.4 lb

## 2017-11-19 DIAGNOSIS — R5383 Other fatigue: Secondary | ICD-10-CM | POA: Diagnosis not present

## 2017-11-19 DIAGNOSIS — Z90710 Acquired absence of both cervix and uterus: Secondary | ICD-10-CM | POA: Diagnosis not present

## 2017-11-19 DIAGNOSIS — C7951 Secondary malignant neoplasm of bone: Secondary | ICD-10-CM | POA: Insufficient documentation

## 2017-11-19 DIAGNOSIS — Z79811 Long term (current) use of aromatase inhibitors: Secondary | ICD-10-CM | POA: Diagnosis not present

## 2017-11-19 DIAGNOSIS — C50812 Malignant neoplasm of overlapping sites of left female breast: Secondary | ICD-10-CM | POA: Insufficient documentation

## 2017-11-19 DIAGNOSIS — J410 Simple chronic bronchitis: Secondary | ICD-10-CM

## 2017-11-19 DIAGNOSIS — K219 Gastro-esophageal reflux disease without esophagitis: Secondary | ICD-10-CM | POA: Diagnosis not present

## 2017-11-19 DIAGNOSIS — M549 Dorsalgia, unspecified: Secondary | ICD-10-CM | POA: Diagnosis not present

## 2017-11-19 DIAGNOSIS — C787 Secondary malignant neoplasm of liver and intrahepatic bile duct: Secondary | ICD-10-CM | POA: Insufficient documentation

## 2017-11-19 DIAGNOSIS — Z170 Estrogen receptor positive status [ER+]: Principal | ICD-10-CM

## 2017-11-19 DIAGNOSIS — C50919 Malignant neoplasm of unspecified site of unspecified female breast: Secondary | ICD-10-CM

## 2017-11-19 DIAGNOSIS — C7802 Secondary malignant neoplasm of left lung: Secondary | ICD-10-CM | POA: Insufficient documentation

## 2017-11-19 DIAGNOSIS — I10 Essential (primary) hypertension: Secondary | ICD-10-CM | POA: Insufficient documentation

## 2017-11-19 DIAGNOSIS — Z9049 Acquired absence of other specified parts of digestive tract: Secondary | ICD-10-CM | POA: Insufficient documentation

## 2017-11-19 DIAGNOSIS — E7800 Pure hypercholesterolemia, unspecified: Secondary | ICD-10-CM | POA: Diagnosis not present

## 2017-11-19 DIAGNOSIS — C50912 Malignant neoplasm of unspecified site of left female breast: Secondary | ICD-10-CM

## 2017-11-19 DIAGNOSIS — Z87891 Personal history of nicotine dependence: Secondary | ICD-10-CM | POA: Insufficient documentation

## 2017-11-19 DIAGNOSIS — C50922 Malignant neoplasm of unspecified site of left male breast: Secondary | ICD-10-CM

## 2017-11-19 DIAGNOSIS — Z79899 Other long term (current) drug therapy: Secondary | ICD-10-CM | POA: Diagnosis not present

## 2017-11-19 LAB — COMPREHENSIVE METABOLIC PANEL
ALK PHOS: 36 U/L — AB (ref 38–126)
ALT: 14 U/L (ref 0–44)
AST: 19 U/L (ref 15–41)
Albumin: 4.2 g/dL (ref 3.5–5.0)
Anion gap: 5 (ref 5–15)
BUN: 33 mg/dL — ABNORMAL HIGH (ref 8–23)
CALCIUM: 9.1 mg/dL (ref 8.9–10.3)
CHLORIDE: 102 mmol/L (ref 98–111)
CO2: 29 mmol/L (ref 22–32)
CREATININE: 1.39 mg/dL — AB (ref 0.44–1.00)
GFR calc non Af Amer: 32 mL/min — ABNORMAL LOW (ref >60)
GFR, EST AFRICAN AMERICAN: 37 mL/min — AB (ref >60)
Glucose, Bld: 89 mg/dL (ref 70–99)
Potassium: 4.9 mmol/L (ref 3.5–5.1)
SODIUM: 136 mmol/L (ref 135–145)
Total Bilirubin: 0.7 mg/dL (ref 0.3–1.2)
Total Protein: 6.8 g/dL (ref 6.5–8.1)

## 2017-11-19 LAB — CBC WITH DIFFERENTIAL/PLATELET
BASOS PCT: 2 %
Basophils Absolute: 0.1 10*3/uL (ref 0.0–0.1)
EOS ABS: 0.1 10*3/uL (ref 0.0–0.5)
Eosinophils Relative: 3 %
HCT: 29.7 % — ABNORMAL LOW (ref 34.8–46.6)
HEMOGLOBIN: 10.2 g/dL — AB (ref 11.6–15.9)
LYMPHS ABS: 1.3 10*3/uL (ref 0.9–3.3)
Lymphocytes Relative: 38 %
MCH: 34.7 pg — ABNORMAL HIGH (ref 25.1–34.0)
MCHC: 34.3 g/dL (ref 31.5–36.0)
MCV: 101 fL (ref 79.5–101.0)
Monocytes Absolute: 0.3 10*3/uL (ref 0.1–0.9)
Monocytes Relative: 10 %
NEUTROS PCT: 47 %
Neutro Abs: 1.6 10*3/uL (ref 1.5–6.5)
Platelets: 123 10*3/uL — ABNORMAL LOW (ref 145–400)
RBC: 2.94 MIL/uL — ABNORMAL LOW (ref 3.70–5.45)
RDW: 13.6 % (ref 11.2–14.5)
WBC: 3.4 10*3/uL — ABNORMAL LOW (ref 3.9–10.3)

## 2017-11-19 MED ORDER — FULVESTRANT 250 MG/5ML IM SOLN
500.0000 mg | Freq: Once | INTRAMUSCULAR | Status: AC
  Administered 2017-11-19: 500 mg via INTRAMUSCULAR

## 2017-11-19 MED ORDER — DENOSUMAB 120 MG/1.7ML ~~LOC~~ SOLN
120.0000 mg | Freq: Once | SUBCUTANEOUS | Status: AC
  Administered 2017-11-19: 120 mg via SUBCUTANEOUS

## 2017-11-19 MED ORDER — FULVESTRANT 250 MG/5ML IM SOLN
INTRAMUSCULAR | Status: AC
Start: 2017-11-19 — End: ?
  Filled 2017-11-19: qty 5

## 2017-11-19 MED ORDER — DENOSUMAB 120 MG/1.7ML ~~LOC~~ SOLN
SUBCUTANEOUS | Status: AC
  Filled 2017-11-19: qty 1.7

## 2017-11-19 NOTE — Telephone Encounter (Signed)
Per 7/1 los, added Lab and Injection.  Patient will pick up calendar at next appt.

## 2017-11-19 NOTE — Patient Instructions (Signed)
Fulvestrant injection What is this medicine? FULVESTRANT (ful VES trant) blocks the effects of estrogen. It is used to treat breast cancer. This medicine may be used for other purposes; ask your health care provider or pharmacist if you have questions. COMMON BRAND NAME(S): FASLODEX What should I tell my health care provider before I take this medicine? They need to know if you have any of these conditions: -bleeding problems -liver disease -low levels of platelets in the blood -an unusual or allergic reaction to fulvestrant, other medicines, foods, dyes, or preservatives -pregnant or trying to get pregnant -breast-feeding How should I use this medicine? This medicine is for injection into a muscle. It is usually given by a health care professional in a hospital or clinic setting. Talk to your pediatrician regarding the use of this medicine in children. Special care may be needed. Overdosage: If you think you have taken too much of this medicine contact a poison control center or emergency room at once. NOTE: This medicine is only for you. Do not share this medicine with others. What if I miss a dose? It is important not to miss your dose. Call your doctor or health care professional if you are unable to keep an appointment. What may interact with this medicine? -medicines that treat or prevent blood clots like warfarin, enoxaparin, and dalteparin This list may not describe all possible interactions. Give your health care provider a list of all the medicines, herbs, non-prescription drugs, or dietary supplements you use. Also tell them if you smoke, drink alcohol, or use illegal drugs. Some items may interact with your medicine. What should I watch for while using this medicine? Your condition will be monitored carefully while you are receiving this medicine. You will need important blood work done while you are taking this medicine. Do not become pregnant while taking this medicine or for  at least 1 year after stopping it. Women of child-bearing potential will need to have a negative pregnancy test before starting this medicine. Women should inform their doctor if they wish to become pregnant or think they might be pregnant. There is a potential for serious side effects to an unborn child. Men should inform their doctors if they wish to father a child. This medicine may lower sperm counts. Talk to your health care professional or pharmacist for more information. Do not breast-feed an infant while taking this medicine or for 1 year after the last dose. What side effects may I notice from receiving this medicine? Side effects that you should report to your doctor or health care professional as soon as possible: -allergic reactions like skin rash, itching or hives, swelling of the face, lips, or tongue -feeling faint or lightheaded, falls -pain, tingling, numbness, or weakness in the legs -signs and symptoms of infection like fever or chills; cough; flu-like symptoms; sore throat -vaginal bleeding Side effects that usually do not require medical attention (report to your doctor or health care professional if they continue or are bothersome): -aches, pains -constipation -diarrhea -headache -hot flashes -nausea, vomiting -pain at site where injected -stomach pain This list may not describe all possible side effects. Call your doctor for medical advice about side effects. You may report side effects to FDA at 1-800-FDA-1088. Where should I keep my medicine? This drug is given in a hospital or clinic and will not be stored at home. NOTE: This sheet is a summary. It may not cover all possible information. If you have questions about this medicine, talk to your   doctor, pharmacist, or health care provider.  2018 Elsevier/Gold Standard (2014-12-04 11:03:55) Denosumab injection What is this medicine? DENOSUMAB (den oh sue mab) slows bone breakdown. Prolia is used to treat osteoporosis in  women after menopause and in men. Delton See is used to treat a high calcium level due to cancer and to prevent bone fractures and other bone problems caused by multiple myeloma or cancer bone metastases. Delton See is also used to treat giant cell tumor of the bone. This medicine may be used for other purposes; ask your health care provider or pharmacist if you have questions. COMMON BRAND NAME(S): Prolia, XGEVA What should I tell my health care provider before I take this medicine? They need to know if you have any of these conditions: -dental disease -having surgery or tooth extraction -infection -kidney disease -low levels of calcium or Vitamin D in the blood -malnutrition -on hemodialysis -skin conditions or sensitivity -thyroid or parathyroid disease -an unusual reaction to denosumab, other medicines, foods, dyes, or preservatives -pregnant or trying to get pregnant -breast-feeding How should I use this medicine? This medicine is for injection under the skin. It is given by a health care professional in a hospital or clinic setting. If you are getting Prolia, a special MedGuide will be given to you by the pharmacist with each prescription and refill. Be sure to read this information carefully each time. For Prolia, talk to your pediatrician regarding the use of this medicine in children. Special care may be needed. For Delton See, talk to your pediatrician regarding the use of this medicine in children. While this drug may be prescribed for children as young as 13 years for selected conditions, precautions do apply. Overdosage: If you think you have taken too much of this medicine contact a poison control center or emergency room at once. NOTE: This medicine is only for you. Do not share this medicine with others. What if I miss a dose? It is important not to miss your dose. Call your doctor or health care professional if you are unable to keep an appointment. What may interact with this  medicine? Do not take this medicine with any of the following medications: -other medicines containing denosumab This medicine may also interact with the following medications: -medicines that lower your chance of fighting infection -steroid medicines like prednisone or cortisone This list may not describe all possible interactions. Give your health care provider a list of all the medicines, herbs, non-prescription drugs, or dietary supplements you use. Also tell them if you smoke, drink alcohol, or use illegal drugs. Some items may interact with your medicine. What should I watch for while using this medicine? Visit your doctor or health care professional for regular checks on your progress. Your doctor or health care professional may order blood tests and other tests to see how you are doing. Call your doctor or health care professional for advice if you get a fever, chills or sore throat, or other symptoms of a cold or flu. Do not treat yourself. This drug may decrease your body's ability to fight infection. Try to avoid being around people who are sick. You should make sure you get enough calcium and vitamin D while you are taking this medicine, unless your doctor tells you not to. Discuss the foods you eat and the vitamins you take with your health care professional. See your dentist regularly. Brush and floss your teeth as directed. Before you have any dental work done, tell your dentist you are receiving this medicine. Do  not become pregnant while taking this medicine or for 5 months after stopping it. Talk with your doctor or health care professional about your birth control options while taking this medicine. Women should inform their doctor if they wish to become pregnant or think they might be pregnant. There is a potential for serious side effects to an unborn child. Talk to your health care professional or pharmacist for more information. What side effects may I notice from receiving this  medicine? Side effects that you should report to your doctor or health care professional as soon as possible: -allergic reactions like skin rash, itching or hives, swelling of the face, lips, or tongue -bone pain -breathing problems -dizziness -jaw pain, especially after dental work -redness, blistering, peeling of the skin -signs and symptoms of infection like fever or chills; cough; sore throat; pain or trouble passing urine -signs of low calcium like fast heartbeat, muscle cramps or muscle pain; pain, tingling, numbness in the hands or feet; seizures -unusual bleeding or bruising -unusually weak or tired Side effects that usually do not require medical attention (report to your doctor or health care professional if they continue or are bothersome): -constipation -diarrhea -headache -joint pain -loss of appetite -muscle pain -runny nose -tiredness -upset stomach This list may not describe all possible side effects. Call your doctor for medical advice about side effects. You may report side effects to FDA at 1-800-FDA-1088. Where should I keep my medicine? This medicine is only given in a clinic, doctor's office, or other health care setting and will not be stored at home. NOTE: This sheet is a summary. It may not cover all possible information. If you have questions about this medicine, talk to your doctor, pharmacist, or health care provider.  2018 Elsevier/Gold Standard (2016-05-30 19:17:21)  

## 2017-11-19 NOTE — Progress Notes (Signed)
ID: Theresa Wallace   DOB: Feb 24, 1924  MR#: [id number]  EHO#:[id number]  PCP: [name], MD GYN: SU:  OTHER MD:  CHIEF COMPLAINT:  Metastatic Breast Cancer, estrogen receptor positive  CURRENT TREATMENT: fulvestrant, palbociclib, Delton See   INTERVAL HISTORY: Theresa Wallace returns today for follow-up and treatment of her estrogen receptor positive breast cancer, accompanied by her daughter. She continues on fulvestrant with a dose due today. She tolerates this well.   She receives monthly Xgeva. She tolerates this well. She will transition to [place] every other month.    She is also on palbociclib, at 100 mg daily, 21 days on, 7 days off. She starts her next 21 day cycle today.  She does endorse continued fatigue.  She says it doesn't particularly interfere with her quality of life.     REVIEW OF SYSTEMS: Nyelli has some mild intermittent back pain.  This is in the location of a previous thoracentesis.  She denies pleuritic pain or shortness of breath.  She says the pain may occasionally feel like a zap.  She denies a cough.  Her daughter states that she hasn't really been complaining of much at home.  She also notes that she is doing well around the house.    Her daughter is concerned about decreasing the Losartan to 14m per day.  She notes that her mom has remained hypertensive here and at home, and is concerned that her bp will worsen.     HISTORY OF PRESENT ILLNESS: From the prior summary:  RTameshia Bonnevilleis an 84y.o.  [place] woman I saw briefly when she moved to this area in 2007.  [phone].  It was grade 2, measured 1 cm maximally and was strongly estrogen and progesterone-receptor positive.  HER-2 was not checked.  This was T1b NX invasive ductal carcinoma, stage I, and she was treated after radiation with anastrozole for several years, eventually switching to Evista primarily for cost  reasons.   More recently, she presented to Dr. GInda Merlinwith fatigue and a dry cough.  He auscultated dullness to percussion at the left base and obtained a chest x-ray which confirmed the presence of a left pleural effusion.   Dr. GInda Merlinscheduled RJoseph Artfor an ultrasound-guided left thoracentesis, and this was performed November 09, 2010.  Approximately 1 L of amber-colored fluid was removed.  Cytology from this procedure (([id number] showed only reactive mesothelial cells.   The patient was then referred here and set up for a CT of the chest and a PET scan.  The CT of the chest on June 20th showed 2 slightly enlarged left axillary lymph nodes with some irregular contours but more importantly multiple pleural nodules in the left chest measuring up to 4.6 cm.  The right chest was normal.  There was no pericardial effusion.  There was only a small to moderate left pleural effusion remaining, and aside from left lower lobe collapse or consolidation, there was no evidence of lung parenchymal involvement.  There was no evidence of bone involvement and also no calcified pleural plaques and no worrisome bone involvement.  PET scan on June 27th showed the nodularity along the left pleura to be intensely hypermetabolic.  For example the large lobe nodule measuring 4.5 cm had an SUV max of 12.9.  There was no hypermetabolic mediastinal nodal activity.  The to left axillary lymph nodes were very mildly hypermetabolic.  The left  breast was clear.  There was 1 hypermetabolic lesion in the liver measuring 3.2 cm.  On June 27th, Dr. [name] performed fine-needle aspiration of a 7 mm abnormal-appearing lymph node in the lower left axilla.  The cytology from this procedure, however, ([name]) also was inconclusive showing no evidence of nodal tissue, mostly blood and fatty tissue.   Her subsequent history is as detailed below   PAST MEDICAL HISTORY: Past Medical History:  Diagnosis Date  . Breast cancer metastasized to  multiple sites ([place]) 06/07/2011  . Cancer ([place])    breast ca  . Hypertension   1. Hypercholesterolemia. 2. Benign tremor. 3. Sigmoid diverticulosis. 4. Onychomycosis. 5. Irritable bowel syndrome. 6. GERD. 7. History of palpitations with Holter monitor in 2008 showing PACs. 8. History of hysterectomy with no salpingo-oophorectomy. 9. History of cholecystectomy. 10. History of bilateral knee arthroscopic surgery. History of hemorrhoid surgery.  FAMILY HISTORY [name] died in an automobile accident.  [name] died with "bone cancer."  The patient had 3 sisters.  One had breast cancer develop in her 85s.  There is also a cousin with breast cancer but no history of ovarian cancer or other history of cancer in first-degree relatives.  GYNECOLOGIC HISTORY: She is GX, P4, first pregnancy to term at age 84.  She was on the hormone replacement after her hysterectomy but only briefly and stopped when she had the initial diagnosis of breast cancer in 1996.  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  Her significant other Theresa Wallace who is a retired Chief Financial Officer is now suffering from dementia and is in a skilled nursing facility. The patient lives with her daughter Theresa Wallace who used to be a Network engineer for Dr. Inda Merlin, and [name]'s husband, Theresa Wallace, who is a physician's assistant with Dr. Durward Fortes.  The patient has 12 grandchildren and 5 great-grandchildren. Incidentally Theresa Wallace has a son in [place].    ADVANCED DIRECTIVES: In place  HEALTH MAINTENANCE: (Updated 02/26/2013) Social History   Tobacco Use  . Smoking status: Former Smoker    Last attempt to quit: 05/22/1968    Years since quitting: 49.5  . Smokeless tobacco: Never Used  Substance Use Topics  . Alcohol use: Yes  . Drug use: No     Colonoscopy:  PAP: s/p  remote hysterectomy  Bone density:  10/23/2011, normal  Lipid panel: Dr. Inda Merlin, UTD   Allergies  Allergen Reactions  . Ciprofloxacin   . Epinephrine   . Tamoxifen   . Mupirocin Rash    Current Outpatient Medications  Medication Sig Dispense Refill  . aspirin 81 MG tablet Take 81 mg by mouth daily.    . Calcium Carbonate-Vitamin D (CALCIUM-VITAMIN D) 500-200 MG-UNIT per tablet Take 1 tablet by mouth 2 (two) times daily with a meal.    . cholecalciferol (VITAMIN D) 400 UNITS TABS Take 2,000 Units by mouth daily.    Marland Kitchen esomeprazole (NEXIUM) 20 MG capsule Take 1 capsule (20 mg total) by mouth as needed.    . Glucosamine HCl-MSM (GLUCOSAMINE-MSM PO) Take 1 tablet by mouth daily.    . hydrochlorothiazide (HYDRODIURIL) 12.5 MG tablet TAKE ONE TABLET BY MOUTH DAILY 60 tablet 1  . levothyroxine (SYNTHROID, LEVOTHROID) 25 MCG tablet Take 25 mcg by mouth daily.    Marland Kitchen losartan (COZAAR) 50 MG tablet 25 mg.    . metroNIDAZOLE (METROGEL) 0.75 %  gel Apply topically 2 (two) times daily. For rosacea    . Multiple Vitamin (MULTIVITAMIN) tablet Take 1 tablet by mouth daily.    . palbociclib (IBRANCE) 100 MG capsule Take 1 capsule (100 mg total) by mouth daily with breakfast. Take for 21 days on, 7 days off, repeat every 28 days. Take whole with food. 21 capsule 6  . propranolol ER (INDERAL LA) 60 MG 24 hr capsule      No current facility-administered medications for this visit.     OBJECTIVE:  Vitals:   11/19/17 1254  BP: (!) 153/57  Pulse: 65  Resp: 17  Temp: 98.6 F (37 C)  SpO2: 98%   Body mass index is 28.06 kg/m.  Filed Weights   11/19/17 1254  Weight: 158 lb 6.4 oz (71.8 kg)  GENERAL: Patient is a well appearing female in no acute distress HEENT:  Sclerae anicteric.  Oropharynx clear and moist. No ulcerations or evidence of oropharyngeal candidiasis. Neck is supple.  NODES:  No cervical, supraclavicular, or axillary lymphadenopathy palpated.  BREAST EXAM:  Deferred. LUNGS:  Clear to  auscultation bilaterally.  No wheezes or rhonchi. HEART:  Regular rate and rhythm. No murmur appreciated. ABDOMEN:  Soft, nontender.  Positive, normoactive bowel sounds. No organomegaly palpated. MSK:  No focal spinal tenderness to palpation. Full range of motion bilaterally in the upper extremities. EXTREMITIES:  No peripheral edema.   SKIN:  Clear with no obvious rashes or skin changes. No nail dyscrasia. NEURO:  Nonfocal. Well oriented.  Appropriate affect.   LAB RESULTS: Lab Results  Component Value Date   WBC 3.4 (L) 11/19/2017   NEUTROABS 1.6 11/19/2017   HGB 10.2 (L) 11/19/2017   HCT 29.7 (L) 11/19/2017   MCV 101.0 11/19/2017   PLT 123 (L) 11/19/2017      Chemistry      Component Value Date/Time   NA 136 10/22/2017 1135   NA 129 (L) 05/07/2017 1045   K 5.4 (H) 10/22/2017 1135   K 5.0 05/07/2017 1045   CL 103 10/22/2017 1135   CL 104 10/24/2012 1014   CO2 26 10/22/2017 1135   CO2 25 05/07/2017 1045   BUN 31 (H) 10/22/2017 1135   BUN 17.6 05/07/2017 1045   CREATININE 1.41 (H) 10/22/2017 1135   CREATININE 1.70 (H) 07/09/2017 1133   CREATININE 1.1 05/07/2017 1045      Component Value Date/Time   CALCIUM 9.4 10/22/2017 1135   CALCIUM 9.3 05/07/2017 1045   ALKPHOS 36 (L) 10/22/2017 1135   ALKPHOS 37 (L) 05/07/2017 1045   AST 21 10/22/2017 1135   AST 20 05/07/2017 1045   ALT 12 10/22/2017 1135   ALT 13 05/07/2017 1045   BILITOT 0.8 10/22/2017 1135   BILITOT 1.55 (H) 05/07/2017 1045      IMAGING STUDIES: No results found.  ASSESSMENT: (1) Status post left lumpectomy in 1996 for a T1b N0 grade 2 invasive ductal carcinoma which was strongly estrogen and progesterone receptor positive, treated with radiation, then anastrozole, then Evista, all treatment discontinued July 2012  METASTATIC DISEASE: July 2012 (2) recurrence to the lining of the left lung and liver documented by liver biopsy July 2012, showing metastatic adenocarcinoma estrogen and progesterone  receptor positive at 96% and 97% respectively; there was no Her-2 amplification.   (3)  She has been on exemestane since early July of 2012, with chest CT scan April 2013 showing complete resolution of her lung and liver metastases  (a) bone density in June of 08/09/2011  was normal  (b) bone density 03/20/2016 was normal with a T score of -0.6  (c) exemestane continued at the time of disease progression noted May 2018  (d) exemestane discontinued April 2019  (4) MRI of the lumbar spine 09-2012 showed significant degenerative disease but no evidence of metastatic disease to bone.  (5) disease progression noted May 2018: CT scan shows liver, lung, and bone lesions  (6) fulvestrant started 09/26/2016, palbociclib added 10/24/2016 at 125 mg daily, 21 days on. 7 days off  (a) dose decreased to 156m daily on 02/14/2017  (7) denosumab/Xgeva started 11/21/2016, repeated every 28 days until 11/19/2017, then every 8 weeks thereafter  PLAN:  RMozettais doing well today.  She continues on Fulvestrant and Xgeva monthly.  After receiving the Fulvestrant and Xgeva today, as per Dr. MVirgie Dadplan [facility] meaning her next dose of XDelton Seewill be due in late August, 2019.    Her labs remain stable and she will continue with Palbociclib 1026mdaily 3 weeks on, 1 week off.  She is starting today.    We reviewed the concern about her blood pressure.  I asked her daughter to take her mom's blood pressure at home this week and let usKoreanow what the readings are.    In regards to the pain, for now, we have asked the patient and her daughter to monitor it.  If it worsens or persists, we will need to do chest xray, or CT chest.    ReShaindyill return in 4 weeks for labs and an injection, and in 8 weeks for labs, f/u with Dr. MaJana Hakimand her injection.  She knows to call for any other issues that may develop before the next visit.  A total of (30) minutes of face-to-face time was spent  with this patient with greater than 50% of that time in counseling and care-coordination.   LiWilber BihariNP  11/19/17 12:59 PM Medical Oncology and Hematology [facility]v[place] [address]el. [phone]  Fax. [phone]

## 2017-11-20 LAB — CANCER ANTIGEN 27.29: CA 27.29: 118.3 U/mL — ABNORMAL HIGH (ref 0.0–38.6)

## 2017-12-14 MED FILL — IBRANCE 100 MG CAPSULE: 100 | 28 days supply | Qty: 21 | Fill #6

## 2017-12-17 ENCOUNTER — Inpatient Hospital Stay: Payer: Medicare Other

## 2017-12-17 DIAGNOSIS — C50812 Malignant neoplasm of overlapping sites of left female breast: Secondary | ICD-10-CM

## 2017-12-17 DIAGNOSIS — C50912 Malignant neoplasm of unspecified site of left female breast: Secondary | ICD-10-CM

## 2017-12-17 DIAGNOSIS — Z170 Estrogen receptor positive status [ER+]: Principal | ICD-10-CM

## 2017-12-17 DIAGNOSIS — C50919 Malignant neoplasm of unspecified site of unspecified female breast: Secondary | ICD-10-CM

## 2017-12-17 DIAGNOSIS — J410 Simple chronic bronchitis: Secondary | ICD-10-CM

## 2017-12-17 DIAGNOSIS — C50922 Malignant neoplasm of unspecified site of left male breast: Secondary | ICD-10-CM

## 2017-12-17 LAB — CBC WITH DIFFERENTIAL/PLATELET
BASOS PCT: 2 %
Basophils Absolute: 0.1 10*3/uL (ref 0.0–0.1)
EOS ABS: 0.1 10*3/uL (ref 0.0–0.5)
Eosinophils Relative: 2 %
HCT: 30.7 % — ABNORMAL LOW (ref 34.8–46.6)
Hemoglobin: 10.4 g/dL — ABNORMAL LOW (ref 11.6–15.9)
Lymphocytes Relative: 34 %
Lymphs Abs: 1.1 10*3/uL (ref 0.9–3.3)
MCH: 34.6 pg — ABNORMAL HIGH (ref 25.1–34.0)
MCHC: 33.9 g/dL (ref 31.5–36.0)
MCV: 102 fL — ABNORMAL HIGH (ref 79.5–101.0)
MONOS PCT: 14 %
Monocytes Absolute: 0.5 10*3/uL (ref 0.1–0.9)
Neutro Abs: 1.5 10*3/uL (ref 1.5–6.5)
Neutrophils Relative %: 48 %
PLATELETS: 118 10*3/uL — AB (ref 145–400)
RBC: 3.01 MIL/uL — ABNORMAL LOW (ref 3.70–5.45)
RDW: 13.9 % (ref 11.2–14.5)
WBC: 3.2 10*3/uL — AB (ref 3.9–10.3)

## 2017-12-17 LAB — COMPREHENSIVE METABOLIC PANEL
ALK PHOS: 36 U/L — AB (ref 38–126)
ALT: 15 U/L (ref 0–44)
AST: 19 U/L (ref 15–41)
Albumin: 4.2 g/dL (ref 3.5–5.0)
Anion gap: 7 (ref 5–15)
BUN: 38 mg/dL — ABNORMAL HIGH (ref 8–23)
CALCIUM: 9.6 mg/dL (ref 8.9–10.3)
CO2: 29 mmol/L (ref 22–32)
CREATININE: 1.46 mg/dL — AB (ref 0.44–1.00)
Chloride: 103 mmol/L (ref 98–111)
GFR calc Af Amer: 35 mL/min — ABNORMAL LOW (ref >60)
GFR, EST NON AFRICAN AMERICAN: 30 mL/min — AB (ref >60)
Glucose, Bld: 73 mg/dL (ref 70–99)
Potassium: 4.7 mmol/L (ref 3.5–5.1)
Sodium: 139 mmol/L (ref 135–145)
Total Bilirubin: 0.6 mg/dL (ref 0.3–1.2)
Total Protein: 6.9 g/dL (ref 6.5–8.1)

## 2017-12-17 MED ORDER — FULVESTRANT 250 MG/5ML IM SOLN
500.0000 mg | Freq: Once | INTRAMUSCULAR | Status: AC
  Administered 2017-12-17: 500 mg via INTRAMUSCULAR

## 2017-12-17 MED ORDER — FULVESTRANT 250 MG/5ML IM SOLN
INTRAMUSCULAR | Status: AC
  Filled 2017-12-17: qty 5

## 2017-12-17 NOTE — Patient Instructions (Signed)

## 2017-12-18 LAB — CANCER ANTIGEN 27.29: CAN 27.29: 127.8 U/mL — AB (ref 0.0–38.6)

## 2018-01-04 ENCOUNTER — Other Ambulatory Visit: Payer: Self-pay | Admitting: Oncology

## 2018-01-04 DIAGNOSIS — C50912 Malignant neoplasm of unspecified site of left female breast: Secondary | ICD-10-CM

## 2018-01-14 NOTE — Progress Notes (Signed)
ID: Theresa Wallace   DOB: 1923-05-24  MR#: [id number]  Theresa Wallace   Patient Care Team: Theresa Huddle, MD as PCP - General (Internal Medicine) GYN: SU:  OTHER MD:  CHIEF COMPLAINT:  Metastatic Breast Cancer, estrogen receptor positive  CURRENT TREATMENT: fulvestrant, palbociclib, Delton See   INTERVAL HISTORY: Theresa Wallace returns today for follow-up and treatment of her estrogen receptor positive breast cancer, accompanied by her daughter. She continues on palbociclib, currently at 100 mg daily, 21 days on and 7 days off. She feels fatigued. She is not as tired on her week off. She lays down for about 10-15 minutes, and then she feels better.   She receives monthly fulvestrant, with a dose due today. She tolerates this well without any complications from the injection.  She also receives monthly denosumab/ Xgeva. She also tolerates this well.    REVIEW OF SYSTEMS: Theresa Wallace reports that she feels tired. She uses her walker at all times. She is used to not having to use it so often. She has neuropathy, pins and needles, and pain in her right foot. She followed up with Dr. Inda Wallace and she was placed on a higher dose Neurontin about 2 years ago, but she felt loopy on this.  She used to do water aerobics regularly. She refuses to exercise now. She denies unusual headaches, visual changes, nausea, vomiting, or dizziness. There has been no unusual cough, phlegm production, or pleurisy. There has been no change in bowel or bladder habits. She denies unexplained fatigue or unexplained weight loss, bleeding, rash, or fever. A detailed review of systems was otherwise stable.     HISTORY OF PRESENT ILLNESS: From the prior summary:  Theresa Wallace is an 84 y.o.  Theresa Wallace woman I saw briefly when she moved to this area in 2007.  She had had a left breast cancer removed at [facility] in [place] in [phone] (Theresa Wallace).  It was grade 2, measured 1 cm maximally and was strongly estrogen and  progesterone-receptor positive.  HER-2 was not checked.  This was T1b NX invasive ductal carcinoma, stage I, and she was treated after radiation with anastrozole for several years, eventually switching to Evista primarily for cost reasons.   More recently, she presented to Dr. Inda Wallace with fatigue and a dry cough.  He auscultated dullness to percussion at the left base and obtained a chest x-ray which confirmed the presence of a left pleural effusion.   Dr. Inda Wallace scheduled Theresa Wallace for an ultrasound-guided left thoracentesis, and this was performed November 09, 2010.  Approximately 1 L of amber-colored fluid was removed.  Cytology from this procedure (Theresa Wallace) showed only reactive mesothelial cells.   The patient was then referred here and set up for a CT of the chest and a PET scan.  The CT of the chest on June 20th showed 2 slightly enlarged left axillary lymph nodes with some irregular contours but more importantly multiple pleural nodules in the left chest measuring up to 4.6 cm.  The right chest was normal.  There was no pericardial effusion.  There was only a small to moderate left pleural effusion remaining, and aside from left lower lobe collapse or consolidation, there was no evidence of lung parenchymal involvement.  There was no evidence of bone involvement and also no calcified pleural plaques and no worrisome bone involvement.  PET scan on June 27th showed the nodularity along the left pleura to be intensely hypermetabolic.  For example the large lobe nodule measuring 4.5 cm had an  SUV max of 12.9.  There was no hypermetabolic mediastinal nodal activity.  The to left axillary lymph nodes were very mildly hypermetabolic.  The left breast was clear.  There was 1 hypermetabolic lesion in the liver measuring 3.2 cm.  On June 27th, Theresa Wallace performed fine-needle aspiration of a 7 mm abnormal-appearing lymph node in the lower left axilla.  The cytology from this procedure, however, (Theresa Wallace) also was  inconclusive showing no evidence of nodal tissue, mostly blood and fatty tissue.   Her subsequent history is as detailed below   PAST MEDICAL HISTORY: Past Medical History:  Diagnosis Date  . Breast cancer metastasized to multiple sites ([place]) 06/07/2011  . Cancer ([place])    breast ca  . Hypertension   1. Hypercholesterolemia. 2. Benign tremor. 3. Sigmoid diverticulosis. 4. Onychomycosis. 5. Irritable bowel syndrome. 6. GERD. 7. History of palpitations with Holter monitor in 2008 showing PACs. 8. History of hysterectomy with no salpingo-oophorectomy. 9. History of cholecystectomy. 10. History of bilateral knee arthroscopic surgery. History of hemorrhoid surgery.  FAMILY HISTORY [name] died in an automobile accident.  [name] died with "bone cancer."  The patient had 3 sisters.  One had breast cancer develop in her 38s.  There is also a cousin with breast cancer but no history of ovarian cancer or other history of cancer in first-degree relatives.  GYNECOLOGIC HISTORY: She is GX, P4, first pregnancy to term at age 11.  She was on the hormone replacement after her hysterectomy but only briefly and stopped when she had the initial diagnosis of breast cancer in 1996.  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  Her significant other Theresa Wallace who is a retired Chief Financial Officer is now suffering from dementia and is in a skilled nursing facility. The patient lives with her daughter Theresa Wallace who used to be a Network engineer for Dr. Inda Wallace, and Theresa Wallace, who is a physician's assistant with Theresa Wallace.  The patient has 12 grandchildren and 5 great-grandchildren. Incidentally Theresa Wallace has a son in [place].    ADVANCED DIRECTIVES: In place  HEALTH MAINTENANCE: (Updated 02/26/2013) Social History   Tobacco Use   . Smoking status: Former Smoker    Last attempt to quit: 05/22/1968    Years since quitting: 49.6  . Smokeless tobacco: Never Used  Substance Use Topics  . Alcohol use: Yes  . Drug use: No     Colonoscopy:  PAP: s/p remote hysterectomy  Bone density:  10/23/2011, normal  Lipid panel: Dr. Inda Wallace, UTD   Allergies  Allergen Reactions  . Ciprofloxacin   . Epinephrine   . Tamoxifen   . Mupirocin Rash    Current Outpatient Medications  Medication Sig Dispense Refill  . aspirin 81 MG tablet Take 81 mg by mouth daily.    . Calcium Carbonate-Vitamin D (CALCIUM-VITAMIN D) 500-200 MG-UNIT per tablet Take 1 tablet by mouth 2 (two) times daily with a meal.    . cholecalciferol (VITAMIN D) 400 UNITS TABS Take 2,000 Units by mouth daily.    Marland Kitchen esomeprazole (NEXIUM) 20 MG capsule Take 1 capsule (20 mg total) by mouth as needed.    . Glucosamine HCl-MSM (GLUCOSAMINE-MSM PO) Take 1 tablet by mouth daily.    . hydrochlorothiazide (HYDRODIURIL) 12.5 MG tablet TAKE ONE TABLET BY MOUTH DAILY 60 tablet 1  . IBRANCE 100 MG capsule  TAKE 1 CAPSULE (100 MG TOTAL) BY MOUTH DAILY WITH BREAKFAST. TAKE FOR 21 DAYS ON, 7 DAYS OFF, REPEAT EVERY 28 DAYS. TAKE WHOLE WITH FOOD. 21 capsule 6  . levothyroxine (SYNTHROID, LEVOTHROID) 25 MCG tablet Take 25 mcg by mouth daily.    Marland Kitchen losartan (COZAAR) 50 MG tablet 25 mg.    . metroNIDAZOLE (METROGEL) 0.75 % gel Apply topically 2 (two) times daily. For rosacea    . Multiple Vitamin (MULTIVITAMIN) tablet Take 1 tablet by mouth daily.    . propranolol ER (INDERAL LA) 60 MG 24 hr capsule      No current facility-administered medications for this visit.     OBJECTIVE: Older white woman in no acute distress  Vitals:   01/15/18 1117  BP: (!) 166/63  Pulse: 65  Resp: 18  Temp: 97.7 F (36.5 C)  SpO2: 97%   Body mass index is 28.15 kg/m.  Filed Weights   01/15/18 1117  Weight: 158 lb 14.4 oz (72.1 kg)   Sclerae unicteric, EOMs intact Oropharynx clear and  moist No cervical or supraclavicular adenopathy Lungs no rales or rhonchi Heart regular rate and rhythm Abd soft, nontender, positive bowel sounds MSK no focal spinal tenderness, no upper extremity lymphedema Neuro: nonfocal, well oriented, appropriate affect Breasts: Right breast is benign.  The left breast status post lumpectomy.  There is no evidence of local recurrence.  Both axillae are benign. LAB RESULTS: Lab Results  Component Value Date   WBC 4.0 01/15/2018   NEUTROABS 2.0 01/15/2018   HGB 10.5 (L) 01/15/2018   HCT 30.9 (L) 01/15/2018   MCV 102.1 (H) 01/15/2018   PLT 144 (L) 01/15/2018      Chemistry      Component Value Date/Time   NA 138 01/15/2018 1057   NA 129 (L) 05/07/2017 1045   K 5.1 01/15/2018 1057   K 5.0 05/07/2017 1045   CL 104 01/15/2018 1057   CL 104 10/24/2012 1014   CO2 28 01/15/2018 1057   CO2 25 05/07/2017 1045   BUN 33 (H) 01/15/2018 1057   BUN 17.6 05/07/2017 1045   CREATININE 1.33 (H) 01/15/2018 1057   CREATININE 1.70 (H) 07/09/2017 1133   CREATININE 1.1 05/07/2017 1045      Component Value Date/Time   CALCIUM 10.1 01/15/2018 1057   CALCIUM 9.3 05/07/2017 1045   ALKPHOS 35 (L) 01/15/2018 1057   ALKPHOS 37 (L) 05/07/2017 1045   AST 20 01/15/2018 1057   AST 20 05/07/2017 1045   ALT 16 01/15/2018 1057   ALT 13 05/07/2017 1045   BILITOT 0.6 01/15/2018 1057   BILITOT 1.55 (H) 05/07/2017 1045      IMAGING STUDIES: No results found.  ASSESSMENT: (1) Status post left lumpectomy in 1996 for a T1b N0 grade 2 invasive ductal carcinoma which was strongly estrogen and progesterone receptor positive, treated with radiation, then anastrozole, then Evista, all treatment discontinued July 2012  METASTATIC DISEASE: July 2012 (2) recurrence to the lining of the left lung and liver documented by liver biopsy July 2012, showing metastatic adenocarcinoma estrogen and progesterone receptor positive at 96% and 97% respectively; there was no Her-2  amplification.   (3)  She has been on exemestane since early July of 2012, with chest CT scan April 2013 showing complete resolution of her lung and liver metastases  (a) bone density in June of 08/09/2011 was normal  (b) bone density 03/20/2016 was normal with a T score of -0.6  (c) exemestane continued at the time  of disease progression noted May 2018  (d) exemestane discontinued April 2019  (4) MRI of the lumbar spine 09-2012 showed significant degenerative disease but no evidence of metastatic disease to bone.  (5) disease progression noted May 2018: CT scan shows liver, lung, and bone lesions  (6) fulvestrant started 09/26/2016, palbociclib added 10/24/2016 at 125 mg daily, 21 days on. 7 days off  (a) dose decreased to 154m daily on 02/14/2017  (7) denosumab/Xgeva started 11/21/2016, repeated every 28 days until 11/19/2017, then every 8 weeks thereafter  PLAN:  RTammelais now 7 years out from definitive diagnosis of metastatic breast cancer.  Her tumor is well controlled on the current treatment and she is tolerating it well.  Accordingly we are making no overall changes.  Her CA-27-29 which had been steadily declining has stabilized.  She is having some peripheral neuropathy not related I believe to any of her treatments.  We are going to try gabapentin at 100 mg at bedtime.  If she can tolerate that after 2 or 3 weeks we can consider twice daily at this very low dose.  If she has any symptoms we will simply stop that.  We again talked about fall precautions.  I have urged her to use her walker at all times  She will have a PET scan again early December and see me shortly after that.  She knows to call for any other issues that may develop before the next visit.  Magrinat, GVirgie Dad MD  01/15/18 11:46 AM Medical Oncology and Hematology C[facility] Center5372 Canal RoadAEpworth Cla[place] [address]Tel. [phone]   Fax. [phone] IAlice Rieger am acting as  scribe for GChauncey CruelMD.  I, GLurline DelMD, have reviewed the above documentation for accuracy and completeness, and I agree with the above.

## 2018-01-15 ENCOUNTER — Inpatient Hospital Stay: Payer: Medicare Other | Attending: Adult Health | Admitting: Oncology

## 2018-01-15 ENCOUNTER — Telehealth: Payer: Self-pay | Admitting: Oncology

## 2018-01-15 ENCOUNTER — Inpatient Hospital Stay: Payer: Medicare Other

## 2018-01-15 VITALS — BP 166/63 | HR 65 | Temp 97.7°F | Resp 18 | Ht 63.0 in | Wt 158.9 lb

## 2018-01-15 DIAGNOSIS — C50922 Malignant neoplasm of unspecified site of left male breast: Secondary | ICD-10-CM

## 2018-01-15 DIAGNOSIS — C50919 Malignant neoplasm of unspecified site of unspecified female breast: Secondary | ICD-10-CM

## 2018-01-15 DIAGNOSIS — Z87891 Personal history of nicotine dependence: Secondary | ICD-10-CM | POA: Insufficient documentation

## 2018-01-15 DIAGNOSIS — Z170 Estrogen receptor positive status [ER+]: Principal | ICD-10-CM

## 2018-01-15 DIAGNOSIS — I10 Essential (primary) hypertension: Secondary | ICD-10-CM | POA: Insufficient documentation

## 2018-01-15 DIAGNOSIS — R5383 Other fatigue: Secondary | ICD-10-CM | POA: Diagnosis not present

## 2018-01-15 DIAGNOSIS — K589 Irritable bowel syndrome without diarrhea: Secondary | ICD-10-CM | POA: Insufficient documentation

## 2018-01-15 DIAGNOSIS — K219 Gastro-esophageal reflux disease without esophagitis: Secondary | ICD-10-CM | POA: Diagnosis not present

## 2018-01-15 DIAGNOSIS — C50812 Malignant neoplasm of overlapping sites of left female breast: Secondary | ICD-10-CM | POA: Insufficient documentation

## 2018-01-15 DIAGNOSIS — Z808 Family history of malignant neoplasm of other organs or systems: Secondary | ICD-10-CM | POA: Diagnosis not present

## 2018-01-15 DIAGNOSIS — E7800 Pure hypercholesterolemia, unspecified: Secondary | ICD-10-CM

## 2018-01-15 DIAGNOSIS — Z8719 Personal history of other diseases of the digestive system: Secondary | ICD-10-CM

## 2018-01-15 DIAGNOSIS — Z79899 Other long term (current) drug therapy: Secondary | ICD-10-CM

## 2018-01-15 DIAGNOSIS — Z79818 Long term (current) use of other agents affecting estrogen receptors and estrogen levels: Secondary | ICD-10-CM | POA: Insufficient documentation

## 2018-01-15 DIAGNOSIS — Z803 Family history of malignant neoplasm of breast: Secondary | ICD-10-CM | POA: Diagnosis not present

## 2018-01-15 DIAGNOSIS — J410 Simple chronic bronchitis: Secondary | ICD-10-CM

## 2018-01-15 DIAGNOSIS — C50912 Malignant neoplasm of unspecified site of left female breast: Secondary | ICD-10-CM

## 2018-01-15 DIAGNOSIS — G629 Polyneuropathy, unspecified: Secondary | ICD-10-CM

## 2018-01-15 DIAGNOSIS — Z7982 Long term (current) use of aspirin: Secondary | ICD-10-CM | POA: Diagnosis not present

## 2018-01-15 LAB — CBC WITH DIFFERENTIAL/PLATELET
Basophils Absolute: 0.1 10*3/uL (ref 0.0–0.1)
Basophils Relative: 2 %
EOS PCT: 2 %
Eosinophils Absolute: 0.1 10*3/uL (ref 0.0–0.5)
HCT: 30.9 % — ABNORMAL LOW (ref 34.8–46.6)
HEMOGLOBIN: 10.5 g/dL — AB (ref 11.6–15.9)
LYMPHS PCT: 30 %
Lymphs Abs: 1.2 10*3/uL (ref 0.9–3.3)
MCH: 34.8 pg — ABNORMAL HIGH (ref 25.1–34.0)
MCHC: 34 g/dL (ref 31.5–36.0)
MCV: 102.1 fL — AB (ref 79.5–101.0)
Monocytes Absolute: 0.6 10*3/uL (ref 0.1–0.9)
Monocytes Relative: 15 %
NEUTROS PCT: 51 %
Neutro Abs: 2 10*3/uL (ref 1.5–6.5)
PLATELETS: 144 10*3/uL — AB (ref 145–400)
RBC: 3.02 MIL/uL — AB (ref 3.70–5.45)
RDW: 14.3 % (ref 11.2–14.5)
WBC: 4 10*3/uL (ref 3.9–10.3)

## 2018-01-15 LAB — COMPREHENSIVE METABOLIC PANEL
ALK PHOS: 35 U/L — AB (ref 38–126)
ALT: 16 U/L (ref 0–44)
AST: 20 U/L (ref 15–41)
Albumin: 4 g/dL (ref 3.5–5.0)
Anion gap: 6 (ref 5–15)
BUN: 33 mg/dL — AB (ref 8–23)
CHLORIDE: 104 mmol/L (ref 98–111)
CO2: 28 mmol/L (ref 22–32)
CREATININE: 1.33 mg/dL — AB (ref 0.44–1.00)
Calcium: 10.1 mg/dL (ref 8.9–10.3)
GFR calc Af Amer: 39 mL/min — ABNORMAL LOW (ref >60)
GFR, EST NON AFRICAN AMERICAN: 34 mL/min — AB (ref >60)
Glucose, Bld: 83 mg/dL (ref 70–99)
Potassium: 5.1 mmol/L (ref 3.5–5.1)
Sodium: 138 mmol/L (ref 135–145)
Total Bilirubin: 0.6 mg/dL (ref 0.3–1.2)
Total Protein: 6.9 g/dL (ref 6.5–8.1)

## 2018-01-15 MED ORDER — DENOSUMAB 120 MG/1.7ML ~~LOC~~ SOLN
120.0000 mg | Freq: Once | SUBCUTANEOUS | Status: AC
  Administered 2018-01-15: 120 mg via SUBCUTANEOUS

## 2018-01-15 MED ORDER — GABAPENTIN 300 MG PO CAPS: 300.0000 mg | ORAL_CAPSULE | Freq: Every day | ORAL | 4 refills | Status: DC

## 2018-01-15 MED ORDER — FULVESTRANT 250 MG/5ML IM SOLN
500.0000 mg | Freq: Once | INTRAMUSCULAR | Status: AC
  Administered 2018-01-15: 500 mg via INTRAMUSCULAR

## 2018-01-15 MED FILL — IBRANCE 100 MG CAPSULE: 100 | 28 days supply | Qty: 21 | Fill #0

## 2018-01-15 NOTE — Patient Instructions (Signed)
Fulvestrant injection What is this medicine? FULVESTRANT (ful VES trant) blocks the effects of estrogen. It is used to treat breast cancer. This medicine may be used for other purposes; ask your health care provider or pharmacist if you have questions. COMMON BRAND NAME(S): FASLODEX What should I tell my health care provider before I take this medicine? They need to know if you have any of these conditions: -bleeding problems -liver disease -low levels of platelets in the blood -an unusual or allergic reaction to fulvestrant, other medicines, foods, dyes, or preservatives -pregnant or trying to get pregnant -breast-feeding How should I use this medicine? This medicine is for injection into a muscle. It is usually given by a health care professional in a hospital or clinic setting. Talk to your pediatrician regarding the use of this medicine in children. Special care may be needed. Overdosage: If you think you have taken too much of this medicine contact a poison control center or emergency room at once. NOTE: This medicine is only for you. Do not share this medicine with others. What if I miss a dose? It is important not to miss your dose. Call your doctor or health care professional if you are unable to keep an appointment. What may interact with this medicine? -medicines that treat or prevent blood clots like warfarin, enoxaparin, and dalteparin This list may not describe all possible interactions. Give your health care provider a list of all the medicines, herbs, non-prescription drugs, or dietary supplements you use. Also tell them if you smoke, drink alcohol, or use illegal drugs. Some items may interact with your medicine. What should I watch for while using this medicine? Your condition will be monitored carefully while you are receiving this medicine. You will need important blood work done while you are taking this medicine. Do not become pregnant while taking this medicine or for  at least 1 year after stopping it. Women of child-bearing potential will need to have a negative pregnancy test before starting this medicine. Women should inform their doctor if they wish to become pregnant or think they might be pregnant. There is a potential for serious side effects to an unborn child. Men should inform their doctors if they wish to father a child. This medicine may lower sperm counts. Talk to your health care professional or pharmacist for more information. Do not breast-feed an infant while taking this medicine or for 1 year after the last dose. What side effects may I notice from receiving this medicine? Side effects that you should report to your doctor or health care professional as soon as possible: -allergic reactions like skin rash, itching or hives, swelling of the face, lips, or tongue -feeling faint or lightheaded, falls -pain, tingling, numbness, or weakness in the legs -signs and symptoms of infection like fever or chills; cough; flu-like symptoms; sore throat -vaginal bleeding Side effects that usually do not require medical attention (report to your doctor or health care professional if they continue or are bothersome): -aches, pains -constipation -diarrhea -headache -hot flashes -nausea, vomiting -pain at site where injected -stomach pain This list may not describe all possible side effects. Call your doctor for medical advice about side effects. You may report side effects to FDA at 1-800-FDA-1088. Where should I keep my medicine? This drug is given in a hospital or clinic and will not be stored at home. NOTE: This sheet is a summary. It may not cover all possible information. If you have questions about this medicine, talk to your   doctor, pharmacist, or health care provider.  2018 Elsevier/Gold Standard (2014-12-04 11:03:55) Denosumab injection What is this medicine? DENOSUMAB (den oh sue mab) slows bone breakdown. Prolia is used to treat osteoporosis in  women after menopause and in men. Delton See is used to treat a high calcium level due to cancer and to prevent bone fractures and other bone problems caused by multiple myeloma or cancer bone metastases. Delton See is also used to treat giant cell tumor of the bone. This medicine may be used for other purposes; ask your health care provider or pharmacist if you have questions. COMMON BRAND NAME(S): Prolia, XGEVA What should I tell my health care provider before I take this medicine? They need to know if you have any of these conditions: -dental disease -having surgery or tooth extraction -infection -kidney disease -low levels of calcium or Vitamin D in the blood -malnutrition -on hemodialysis -skin conditions or sensitivity -thyroid or parathyroid disease -an unusual reaction to denosumab, other medicines, foods, dyes, or preservatives -pregnant or trying to get pregnant -breast-feeding How should I use this medicine? This medicine is for injection under the skin. It is given by a health care professional in a hospital or clinic setting. If you are getting Prolia, a special MedGuide will be given to you by the pharmacist with each prescription and refill. Be sure to read this information carefully each time. For Prolia, talk to your pediatrician regarding the use of this medicine in children. Special care may be needed. For Delton See, talk to your pediatrician regarding the use of this medicine in children. While this drug may be prescribed for children as young as 13 years for selected conditions, precautions do apply. Overdosage: If you think you have taken too much of this medicine contact a poison control center or emergency room at once. NOTE: This medicine is only for you. Do not share this medicine with others. What if I miss a dose? It is important not to miss your dose. Call your doctor or health care professional if you are unable to keep an appointment. What may interact with this  medicine? Do not take this medicine with any of the following medications: -other medicines containing denosumab This medicine may also interact with the following medications: -medicines that lower your chance of fighting infection -steroid medicines like prednisone or cortisone This list may not describe all possible interactions. Give your health care provider a list of all the medicines, herbs, non-prescription drugs, or dietary supplements you use. Also tell them if you smoke, drink alcohol, or use illegal drugs. Some items may interact with your medicine. What should I watch for while using this medicine? Visit your doctor or health care professional for regular checks on your progress. Your doctor or health care professional may order blood tests and other tests to see how you are doing. Call your doctor or health care professional for advice if you get a fever, chills or sore throat, or other symptoms of a cold or flu. Do not treat yourself. This drug may decrease your body's ability to fight infection. Try to avoid being around people who are sick. You should make sure you get enough calcium and vitamin D while you are taking this medicine, unless your doctor tells you not to. Discuss the foods you eat and the vitamins you take with your health care professional. See your dentist regularly. Brush and floss your teeth as directed. Before you have any dental work done, tell your dentist you are receiving this medicine. Do  not become pregnant while taking this medicine or for 5 months after stopping it. Talk with your doctor or health care professional about your birth control options while taking this medicine. Women should inform their doctor if they wish to become pregnant or think they might be pregnant. There is a potential for serious side effects to an unborn child. Talk to your health care professional or pharmacist for more information. What side effects may I notice from receiving this  medicine? Side effects that you should report to your doctor or health care professional as soon as possible: -allergic reactions like skin rash, itching or hives, swelling of the face, lips, or tongue -bone pain -breathing problems -dizziness -jaw pain, especially after dental work -redness, blistering, peeling of the skin -signs and symptoms of infection like fever or chills; cough; sore throat; pain or trouble passing urine -signs of low calcium like fast heartbeat, muscle cramps or muscle pain; pain, tingling, numbness in the hands or feet; seizures -unusual bleeding or bruising -unusually weak or tired Side effects that usually do not require medical attention (report to your doctor or health care professional if they continue or are bothersome): -constipation -diarrhea -headache -joint pain -loss of appetite -muscle pain -runny nose -tiredness -upset stomach This list may not describe all possible side effects. Call your doctor for medical advice about side effects. You may report side effects to FDA at 1-800-FDA-1088. Where should I keep my medicine? This medicine is only given in a clinic, doctor's office, or other health care setting and will not be stored at home. NOTE: This sheet is a summary. It may not cover all possible information. If you have questions about this medicine, talk to your doctor, pharmacist, or health care provider.  2018 Elsevier/Gold Standard (2016-05-30 19:17:21)  

## 2018-01-15 NOTE — Telephone Encounter (Signed)
Gave pt avs and calendar of upcoming appts  °

## 2018-01-16 LAB — CANCER ANTIGEN 27.29: CA 27.29: 138.4 U/mL — ABNORMAL HIGH (ref 0.0–38.6)

## 2018-01-17 ENCOUNTER — Other Ambulatory Visit: Payer: Self-pay | Admitting: *Deleted

## 2018-01-17 ENCOUNTER — Encounter: Payer: Self-pay | Admitting: Oncology

## 2018-01-18 ENCOUNTER — Other Ambulatory Visit: Payer: Self-pay | Admitting: *Deleted

## 2018-01-18 MED ORDER — GABAPENTIN 100 MG PO CAPS: ORAL_CAPSULE | ORAL | 3 refills | Status: DC

## 2018-01-20 ENCOUNTER — Other Ambulatory Visit: Payer: Self-pay | Admitting: Adult Health

## 2018-01-20 DIAGNOSIS — E875 Hyperkalemia: Secondary | ICD-10-CM

## 2018-01-30 ENCOUNTER — Encounter: Payer: Self-pay | Admitting: Oncology

## 2018-01-30 ENCOUNTER — Other Ambulatory Visit: Payer: Self-pay | Admitting: *Deleted

## 2018-02-05 ENCOUNTER — Ambulatory Visit (INDEPENDENT_AMBULATORY_CARE_PROVIDER_SITE_OTHER): Payer: Medicare Other | Admitting: Orthopedic Surgery

## 2018-02-05 ENCOUNTER — Ambulatory Visit (INDEPENDENT_AMBULATORY_CARE_PROVIDER_SITE_OTHER): Payer: Self-pay

## 2018-02-05 ENCOUNTER — Encounter (INDEPENDENT_AMBULATORY_CARE_PROVIDER_SITE_OTHER): Payer: Self-pay | Admitting: Orthopedic Surgery

## 2018-02-05 VITALS — BP 138/67 | HR 67 | Resp 18 | Ht 64.5 in | Wt 158.0 lb

## 2018-02-05 DIAGNOSIS — M19012 Primary osteoarthritis, left shoulder: Secondary | ICD-10-CM | POA: Diagnosis not present

## 2018-02-05 DIAGNOSIS — M25512 Pain in left shoulder: Secondary | ICD-10-CM

## 2018-02-05 MED ORDER — BUPIVACAINE HCL 0.5 % IJ SOLN
2.0000 mL | INTRAMUSCULAR | Status: AC | PRN
  Administered 2018-02-05: 2 mL via INTRA_ARTICULAR

## 2018-02-05 MED ORDER — LIDOCAINE HCL 2 % IJ SOLN
2.0000 mL | INTRAMUSCULAR | Status: AC | PRN
  Administered 2018-02-05: 2 mL

## 2018-02-05 MED ORDER — METHYLPREDNISOLONE ACETATE 40 MG/ML IJ SUSP
80.0000 mg | INTRAMUSCULAR | Status: AC | PRN
  Administered 2018-02-05: 80 mg

## 2018-02-05 NOTE — Progress Notes (Signed)
Office Visit Note   Patient: Theresa Wallace           Date of Birth: [birth year]           MRN: [id number] Visit Date: 02/05/2018              Requested by: Theresa Huddle, MD [address] [place], [place] [address] PCP: Theresa Huddle, MD   Assessment & Plan: Visit Diagnoses:  1. Acute pain of left shoulder   2. Primary osteoarthritis, left shoulder     Plan:  #1: Corticosteroid injection to the left shoulder intra-articular and subacromial.  Good relief postinjection  Follow-Up Instructions: Return if symptoms worsen or fail to improve.   Orders:  Orders Placed This Encounter  Procedures  . XR Shoulder Left   No orders of the defined types were placed in this encounter.     Procedures: Large Joint Inj: L glenohumeral on 02/05/2018 2:53 PM Indications: pain and diagnostic evaluation Details: 25 G 1.5 in needle, posterior approach  Arthrogram: No  Medications: 2 mL lidocaine 2 %; 2 mL bupivacaine 0.5 %; 80 mg methylPREDNISolone acetate 40 MG/ML  Intra-and extra-articular injection was performed. Consent was given by the patient. Immediately prior to procedure a time out was called to verify the correct patient, procedure, equipment, support staff and site/side marked as required. Patient was prepped and draped in the usual sterile fashion.       Clinical Data: No additional findings.   Subjective: Chief Complaint  Patient presents with  . Left Shoulder - Pain  . Shoulder Pain    Left shoulder pain, no injury    HPI  Theresa Wallace is a 84 year old white female who is seen today for evaluation of her left shoulder.  She states without any history of injury or trauma.  She states that her shoulder was frozen and they have been using ice to it and has unfrozen but still is quite symptomatic.  She denies any recent injury or trauma.  Denies any previous history of problems with the left shoulder according to the daughter.  Had problems with her right shoulder  in the past.  Review of Systems  Constitutional: Positive for activity change.  HENT: Negative for trouble swallowing.   Eyes: Negative for pain.  Respiratory: Negative for shortness of breath.   Cardiovascular: Positive for leg swelling.  Gastrointestinal: Negative for constipation.  Endocrine: Negative for cold intolerance.  Genitourinary: Negative for difficulty urinating.  Musculoskeletal: Negative for joint swelling.  Skin: Negative for rash.  Allergic/Immunologic: Positive for food allergies.  Neurological: Negative for weakness.  Hematological: Does not bruise/bleed easily.  Psychiatric/Behavioral: Negative for sleep disturbance.     Objective: Vital Signs: BP 138/67 (BP Location: Right Arm, Patient Position: Sitting, Cuff Size: Normal)   Pulse 67   Resp 18   Ht 5' 4.5" (1.638 m)   Wt 158 lb (71.7 kg)   BMI 26.70 kg/m   Physical Exam  Constitutional: She is oriented to person, place, and time. She appears well-developed and well-nourished.  HENT:  Mouth/Throat: Oropharynx is clear and moist.  Eyes: Pupils are equal, round, and reactive to light. EOM are normal.  Pulmonary/Chest: Effort normal.  Neurological: She is alert and oriented to person, place, and time.  Skin: Skin is warm and dry.  Psychiatric: She has a normal mood and affect. Her behavior is normal.    Ortho Exam  Exam today reveals only about 30 to 40 degrees of abduction and forward flexion.  With internal and external rotation at 40 degrees of abduction.  She is neurovascular intact distally.  Specialty Comments:  No specialty comments available.  Imaging: Xr Shoulder Left  Result Date: 02/05/2018 4 view x-ray of the left shoulder reveal bone-on-bone OA of the left glenohumeral joint.  Periarticular spurring inferiorly at both the glenoid and the humeral head.    PMFS History: Current Outpatient Medications  Medication Sig Dispense Refill  . aspirin 81 MG tablet Take 81 mg by mouth daily.     . Calcium Carbonate-Vitamin D (CALCIUM-VITAMIN D) 500-200 MG-UNIT per tablet Take 1 tablet by mouth 2 (two) times daily with a meal.    . cholecalciferol (VITAMIN D) 400 UNITS TABS Take 2,000 Units by mouth daily.    Marland Kitchen denosumab (PROLIA) 60 MG/ML SOSY injection Inject into the skin.    Marland Kitchen esomeprazole (NEXIUM) 20 MG capsule Take 1 capsule (20 mg total) by mouth as needed.    . fluconazole (DIFLUCAN) 150 MG tablet as needed.    . gabapentin (NEURONTIN) 100 MG capsule Take 1 tab at bedtime and may increase to bid if needed 60 capsule 3  . hydrochlorothiazide (HYDRODIURIL) 12.5 MG tablet TAKE ONE TABLET BY MOUTH DAILY 60 tablet 0  . IBRANCE 100 MG capsule TAKE 1 CAPSULE (100 MG TOTAL) BY MOUTH DAILY WITH BREAKFAST. TAKE FOR 21 DAYS ON, 7 DAYS OFF, REPEAT EVERY 28 DAYS. TAKE WHOLE WITH FOOD. 21 capsule 6  . ketoconazole (NIZORAL) 2 % cream     . levothyroxine (SYNTHROID, LEVOTHROID) 25 MCG tablet Take 25 mcg by mouth daily.    Marland Kitchen losartan (COZAAR) 50 MG tablet 25 mg.    . metroNIDAZOLE (METROGEL) 0.75 % gel Apply topically 2 (two) times daily. For rosacea    . Multiple Vitamin (MULTIVITAMIN) tablet Take 1 tablet by mouth daily.    . propranolol ER (INDERAL LA) 60 MG 24 hr capsule     . triamcinolone cream (KENALOG) 0.1 % as needed.    . Glucosamine HCl-MSM (GLUCOSAMINE-MSM PO) Take 1 tablet by mouth daily.     No current facility-administered medications for this visit.     Patient Active Problem List   Diagnosis Date Noted  . Goals of care, counseling/discussion 08/27/2017  . Malignant neoplasm of overlapping sites of left breast in female, estrogen receptor positive ([place]) 09/11/2016  . Cancer of left breast ([place]) 09/13/2015  . Sensorineural hearing loss (SNHL), bilateral 08/23/2015  . Genetic testing 12/18/2014  . COPD (chronic obstructive pulmonary disease) ([place]) 09/07/2014  . Breast cancer metastasized to multiple sites [facility]) 06/07/2011   Past Medical History:  Diagnosis Date  .  Breast cancer metastasized to multiple sites ([place]) 06/07/2011  . Cancer ([place])    breast ca  . Hypertension     Family History  Problem Relation Age of Onset  . Breast cancer Sister 70  . Breast cancer Cousin        dx in her 47s  . Bone cancer Mother     Past Surgical History:  Procedure Laterality Date  . ABDOMINAL HYSTERECTOMY    . BREAST LUMPECTOMY    . CATARACT EXTRACTION    . CHOLECYSTECTOMY    . KNEE ARTHROSCOPY     Social History   Occupational History  . Not on file  Tobacco Use  . Smoking status: Former Smoker    Last attempt to quit: 05/22/1968    Years since quitting: 49.7  . Smokeless tobacco: Never Used  Substance and Sexual Activity  .  Alcohol use: Yes    Comment: 2 yearly  . Drug use: No  . Sexual activity: Not on file

## 2018-02-11 ENCOUNTER — Inpatient Hospital Stay: Payer: Medicare Other | Attending: Adult Health

## 2018-02-11 ENCOUNTER — Inpatient Hospital Stay: Payer: Medicare Other

## 2018-02-11 VITALS — BP 153/63 | HR 62 | Temp 98.3°F

## 2018-02-11 DIAGNOSIS — Z170 Estrogen receptor positive status [ER+]: Secondary | ICD-10-CM | POA: Diagnosis not present

## 2018-02-11 DIAGNOSIS — Z23 Encounter for immunization: Secondary | ICD-10-CM | POA: Insufficient documentation

## 2018-02-11 DIAGNOSIS — C50812 Malignant neoplasm of overlapping sites of left female breast: Secondary | ICD-10-CM | POA: Insufficient documentation

## 2018-02-11 DIAGNOSIS — C50912 Malignant neoplasm of unspecified site of left female breast: Secondary | ICD-10-CM

## 2018-02-11 DIAGNOSIS — C50922 Malignant neoplasm of unspecified site of left male breast: Secondary | ICD-10-CM

## 2018-02-11 DIAGNOSIS — C50919 Malignant neoplasm of unspecified site of unspecified female breast: Secondary | ICD-10-CM

## 2018-02-11 DIAGNOSIS — J410 Simple chronic bronchitis: Secondary | ICD-10-CM

## 2018-02-11 DIAGNOSIS — Z79818 Long term (current) use of other agents affecting estrogen receptors and estrogen levels: Secondary | ICD-10-CM | POA: Diagnosis not present

## 2018-02-11 LAB — COMPREHENSIVE METABOLIC PANEL
ALK PHOS: 34 U/L — AB (ref 38–126)
ALT: 13 U/L (ref 0–44)
AST: 18 U/L (ref 15–41)
Albumin: 4.1 g/dL (ref 3.5–5.0)
Anion gap: 7 (ref 5–15)
BILIRUBIN TOTAL: 1 mg/dL (ref 0.3–1.2)
BUN: 28 mg/dL — ABNORMAL HIGH (ref 8–23)
CALCIUM: 9.2 mg/dL (ref 8.9–10.3)
CO2: 27 mmol/L (ref 22–32)
Chloride: 98 mmol/L (ref 98–111)
Creatinine, Ser: 1.35 mg/dL — ABNORMAL HIGH (ref 0.44–1.00)
GFR, EST AFRICAN AMERICAN: 38 mL/min — AB (ref >60)
GFR, EST NON AFRICAN AMERICAN: 33 mL/min — AB (ref >60)
Glucose, Bld: 81 mg/dL (ref 70–99)
Potassium: 5.3 mmol/L — ABNORMAL HIGH (ref 3.5–5.1)
Sodium: 132 mmol/L — ABNORMAL LOW (ref 135–145)
TOTAL PROTEIN: 6.6 g/dL (ref 6.5–8.1)

## 2018-02-11 LAB — CBC WITH DIFFERENTIAL/PLATELET
BASOS ABS: 0.1 10*3/uL (ref 0.0–0.1)
BASOS PCT: 3 %
Eosinophils Absolute: 0.1 10*3/uL (ref 0.0–0.5)
Eosinophils Relative: 3 %
HEMATOCRIT: 30.6 % — AB (ref 34.8–46.6)
HEMOGLOBIN: 10.7 g/dL — AB (ref 11.6–15.9)
Lymphocytes Relative: 34 %
Lymphs Abs: 1 10*3/uL (ref 0.9–3.3)
MCH: 35.1 pg — ABNORMAL HIGH (ref 25.1–34.0)
MCHC: 35 g/dL (ref 31.5–36.0)
MCV: 100.3 fL (ref 79.5–101.0)
MONO ABS: 0.3 10*3/uL (ref 0.1–0.9)
Monocytes Relative: 11 %
NEUTROS PCT: 49 %
Neutro Abs: 1.5 10*3/uL (ref 1.5–6.5)
Platelets: 123 10*3/uL — ABNORMAL LOW (ref 145–400)
RBC: 3.05 MIL/uL — ABNORMAL LOW (ref 3.70–5.45)
RDW: 13.8 % (ref 11.2–14.5)
WBC: 3.1 10*3/uL — AB (ref 3.9–10.3)

## 2018-02-11 MED ORDER — FULVESTRANT 250 MG/5ML IM SOLN
INTRAMUSCULAR | Status: AC
  Filled 2018-02-11: qty 5

## 2018-02-11 MED ORDER — INFLUENZA VAC SPLIT QUAD 0.5 ML IM SUSY
0.5000 mL | PREFILLED_SYRINGE | Freq: Once | INTRAMUSCULAR | Status: AC
  Administered 2018-02-11: 0.5 mL via INTRAMUSCULAR

## 2018-02-11 MED ORDER — INFLUENZA VAC SPLIT QUAD 0.5 ML IM SUSY
PREFILLED_SYRINGE | INTRAMUSCULAR | Status: AC
  Filled 2018-02-11: qty 0.5

## 2018-02-11 MED ORDER — FULVESTRANT 250 MG/5ML IM SOLN
500.0000 mg | Freq: Once | INTRAMUSCULAR | Status: AC
  Administered 2018-02-11: 500 mg via INTRAMUSCULAR

## 2018-02-11 MED FILL — IBRANCE 100 MG CAPSULE: 100 | 28 days supply | Qty: 21 | Fill #1

## 2018-02-11 NOTE — Patient Instructions (Signed)
Fulvestrant injection What is this medicine? FULVESTRANT (ful VES trant) blocks the effects of estrogen. It is used to treat breast cancer. This medicine may be used for other purposes; ask your health care provider or pharmacist if you have questions. COMMON BRAND NAME(S): FASLODEX What should I tell my health care provider before I take this medicine? They need to know if you have any of these conditions: -bleeding problems -liver disease -low levels of platelets in the blood -an unusual or allergic reaction to fulvestrant, other medicines, foods, dyes, or preservatives -pregnant or trying to get pregnant -breast-feeding How should I use this medicine? This medicine is for injection into a muscle. It is usually given by a health care professional in a hospital or clinic setting. Talk to your pediatrician regarding the use of this medicine in children. Special care may be needed. Overdosage: If you think you have taken too much of this medicine contact a poison control center or emergency room at once. NOTE: This medicine is only for you. Do not share this medicine with others. What if I miss a dose? It is important not to miss your dose. Call your doctor or health care professional if you are unable to keep an appointment. What may interact with this medicine? -medicines that treat or prevent blood clots like warfarin, enoxaparin, and dalteparin This list may not describe all possible interactions. Give your health care provider a list of all the medicines, herbs, non-prescription drugs, or dietary supplements you use. Also tell them if you smoke, drink alcohol, or use illegal drugs. Some items may interact with your medicine. What should I watch for while using this medicine? Your condition will be monitored carefully while you are receiving this medicine. You will need important blood work done while you are taking this medicine. Do not become pregnant while taking this medicine or for  at least 1 year after stopping it. Women of child-bearing potential will need to have a negative pregnancy test before starting this medicine. Women should inform their doctor if they wish to become pregnant or think they might be pregnant. There is a potential for serious side effects to an unborn child. Men should inform their doctors if they wish to father a child. This medicine may lower sperm counts. Talk to your health care professional or pharmacist for more information. Do not breast-feed an infant while taking this medicine or for 1 year after the last dose. What side effects may I notice from receiving this medicine? Side effects that you should report to your doctor or health care professional as soon as possible: -allergic reactions like skin rash, itching or hives, swelling of the face, lips, or tongue -feeling faint or lightheaded, falls -pain, tingling, numbness, or weakness in the legs -signs and symptoms of infection like fever or chills; cough; flu-like symptoms; sore throat -vaginal bleeding Side effects that usually do not require medical attention (report to your doctor or health care professional if they continue or are bothersome): -aches, pains -constipation -diarrhea -headache -hot flashes -nausea, vomiting -pain at site where injected -stomach pain This list may not describe all possible side effects. Call your doctor for medical advice about side effects. You may report side effects to FDA at 1-800-FDA-1088. Where should I keep my medicine? This drug is given in a hospital or clinic and will not be stored at home. NOTE: This sheet is a summary. It may not cover all possible information. If you have questions about this medicine, talk to your   doctor, pharmacist, or health care provider.  2018 Elsevier/Gold Standard (2014-12-04 11:03:55) Denosumab injection What is this medicine? DENOSUMAB (den oh sue mab) slows bone breakdown. Prolia is used to treat osteoporosis in  women after menopause and in men. Delton See is used to treat a high calcium level due to cancer and to prevent bone fractures and other bone problems caused by multiple myeloma or cancer bone metastases. Delton See is also used to treat giant cell tumor of the bone. This medicine may be used for other purposes; ask your health care provider or pharmacist if you have questions. COMMON BRAND NAME(S): Prolia, XGEVA What should I tell my health care provider before I take this medicine? They need to know if you have any of these conditions: -dental disease -having surgery or tooth extraction -infection -kidney disease -low levels of calcium or Vitamin D in the blood -malnutrition -on hemodialysis -skin conditions or sensitivity -thyroid or parathyroid disease -an unusual reaction to denosumab, other medicines, foods, dyes, or preservatives -pregnant or trying to get pregnant -breast-feeding How should I use this medicine? This medicine is for injection under the skin. It is given by a health care professional in a hospital or clinic setting. If you are getting Prolia, a special MedGuide will be given to you by the pharmacist with each prescription and refill. Be sure to read this information carefully each time. For Prolia, talk to your pediatrician regarding the use of this medicine in children. Special care may be needed. For Delton See, talk to your pediatrician regarding the use of this medicine in children. While this drug may be prescribed for children as young as 13 years for selected conditions, precautions do apply. Overdosage: If you think you have taken too much of this medicine contact a poison control center or emergency room at once. NOTE: This medicine is only for you. Do not share this medicine with others. What if I miss a dose? It is important not to miss your dose. Call your doctor or health care professional if you are unable to keep an appointment. What may interact with this  medicine? Do not take this medicine with any of the following medications: -other medicines containing denosumab This medicine may also interact with the following medications: -medicines that lower your chance of fighting infection -steroid medicines like prednisone or cortisone This list may not describe all possible interactions. Give your health care provider a list of all the medicines, herbs, non-prescription drugs, or dietary supplements you use. Also tell them if you smoke, drink alcohol, or use illegal drugs. Some items may interact with your medicine. What should I watch for while using this medicine? Visit your doctor or health care professional for regular checks on your progress. Your doctor or health care professional may order blood tests and other tests to see how you are doing. Call your doctor or health care professional for advice if you get a fever, chills or sore throat, or other symptoms of a cold or flu. Do not treat yourself. This drug may decrease your body's ability to fight infection. Try to avoid being around people who are sick. You should make sure you get enough calcium and vitamin D while you are taking this medicine, unless your doctor tells you not to. Discuss the foods you eat and the vitamins you take with your health care professional. See your dentist regularly. Brush and floss your teeth as directed. Before you have any dental work done, tell your dentist you are receiving this medicine. Do  not become pregnant while taking this medicine or for 5 months after stopping it. Talk with your doctor or health care professional about your birth control options while taking this medicine. Women should inform their doctor if they wish to become pregnant or think they might be pregnant. There is a potential for serious side effects to an unborn child. Talk to your health care professional or pharmacist for more information. What side effects may I notice from receiving this  medicine? Side effects that you should report to your doctor or health care professional as soon as possible: -allergic reactions like skin rash, itching or hives, swelling of the face, lips, or tongue -bone pain -breathing problems -dizziness -jaw pain, especially after dental work -redness, blistering, peeling of the skin -signs and symptoms of infection like fever or chills; cough; sore throat; pain or trouble passing urine -signs of low calcium like fast heartbeat, muscle cramps or muscle pain; pain, tingling, numbness in the hands or feet; seizures -unusual bleeding or bruising -unusually weak or tired Side effects that usually do not require medical attention (report to your doctor or health care professional if they continue or are bothersome): -constipation -diarrhea -headache -joint pain -loss of appetite -muscle pain -runny nose -tiredness -upset stomach This list may not describe all possible side effects. Call your doctor for medical advice about side effects. You may report side effects to FDA at 1-800-FDA-1088. Where should I keep my medicine? This medicine is only given in a clinic, doctor's office, or other health care setting and will not be stored at home. NOTE: This sheet is a summary. It may not cover all possible information. If you have questions about this medicine, talk to your doctor, pharmacist, or health care provider.  2018 Elsevier/Gold Standard (2016-05-30 19:17:21)  

## 2018-02-12 LAB — CANCER ANTIGEN 27.29: CA 27.29: 144.2 U/mL — ABNORMAL HIGH (ref 0.0–38.6)

## 2018-03-11 ENCOUNTER — Inpatient Hospital Stay: Payer: Medicare Other

## 2018-03-11 ENCOUNTER — Inpatient Hospital Stay: Payer: Medicare Other | Attending: Adult Health

## 2018-03-11 VITALS — BP 162/65 | HR 71 | Temp 97.9°F | Resp 18

## 2018-03-11 DIAGNOSIS — C50919 Malignant neoplasm of unspecified site of unspecified female breast: Secondary | ICD-10-CM

## 2018-03-11 DIAGNOSIS — Z79818 Long term (current) use of other agents affecting estrogen receptors and estrogen levels: Secondary | ICD-10-CM | POA: Insufficient documentation

## 2018-03-11 DIAGNOSIS — C50812 Malignant neoplasm of overlapping sites of left female breast: Secondary | ICD-10-CM | POA: Insufficient documentation

## 2018-03-11 DIAGNOSIS — C50922 Malignant neoplasm of unspecified site of left male breast: Secondary | ICD-10-CM

## 2018-03-11 DIAGNOSIS — J410 Simple chronic bronchitis: Secondary | ICD-10-CM

## 2018-03-11 DIAGNOSIS — Z170 Estrogen receptor positive status [ER+]: Principal | ICD-10-CM

## 2018-03-11 DIAGNOSIS — C50912 Malignant neoplasm of unspecified site of left female breast: Secondary | ICD-10-CM

## 2018-03-11 DIAGNOSIS — Z79899 Other long term (current) drug therapy: Secondary | ICD-10-CM | POA: Insufficient documentation

## 2018-03-11 LAB — CBC WITH DIFFERENTIAL/PLATELET
ABS IMMATURE GRANULOCYTES: 0.04 10*3/uL (ref 0.00–0.07)
Basophils Absolute: 0 10*3/uL (ref 0.0–0.1)
Basophils Relative: 1 %
Eosinophils Absolute: 0.1 10*3/uL (ref 0.0–0.5)
Eosinophils Relative: 2 %
HCT: 31.5 % — ABNORMAL LOW (ref 36.0–46.0)
HEMOGLOBIN: 10.5 g/dL — AB (ref 12.0–15.0)
Immature Granulocytes: 1 %
LYMPHS PCT: 30 %
Lymphs Abs: 1.1 10*3/uL (ref 0.7–4.0)
MCH: 35 pg — AB (ref 26.0–34.0)
MCHC: 33.3 g/dL (ref 30.0–36.0)
MCV: 105 fL — ABNORMAL HIGH (ref 80.0–100.0)
MONO ABS: 0.4 10*3/uL (ref 0.1–1.0)
Monocytes Relative: 11 %
NEUTROS ABS: 1.9 10*3/uL (ref 1.7–7.7)
Neutrophils Relative %: 55 %
Platelets: 137 10*3/uL — ABNORMAL LOW (ref 150–400)
RBC: 3 MIL/uL — AB (ref 3.87–5.11)
RDW: 13.9 % (ref 11.5–15.5)
WBC: 3.5 10*3/uL — AB (ref 4.0–10.5)
nRBC: 0 % (ref 0.0–0.2)

## 2018-03-11 LAB — COMPREHENSIVE METABOLIC PANEL WITH GFR
ALT: 14 U/L (ref 0–44)
AST: 18 U/L (ref 15–41)
Albumin: 4 g/dL (ref 3.5–5.0)
Alkaline Phosphatase: 37 U/L — ABNORMAL LOW (ref 38–126)
Anion gap: 10 (ref 5–15)
BUN: 42 mg/dL — ABNORMAL HIGH (ref 8–23)
CO2: 25 mmol/L (ref 22–32)
Calcium: 9.5 mg/dL (ref 8.9–10.3)
Chloride: 107 mmol/L (ref 98–111)
Creatinine, Ser: 1.6 mg/dL — ABNORMAL HIGH (ref 0.44–1.00)
GFR calc Af Amer: 31 mL/min — ABNORMAL LOW
GFR calc non Af Amer: 27 mL/min — ABNORMAL LOW
Glucose, Bld: 69 mg/dL — ABNORMAL LOW (ref 70–99)
Potassium: 5.1 mmol/L (ref 3.5–5.1)
Sodium: 142 mmol/L (ref 135–145)
Total Bilirubin: 0.8 mg/dL (ref 0.3–1.2)
Total Protein: 6.9 g/dL (ref 6.5–8.1)

## 2018-03-11 MED ORDER — FULVESTRANT 250 MG/5ML IM SOLN
500.0000 mg | Freq: Once | INTRAMUSCULAR | Status: AC
  Administered 2018-03-11: 500 mg via INTRAMUSCULAR

## 2018-03-11 MED ORDER — DENOSUMAB 120 MG/1.7ML ~~LOC~~ SOLN
SUBCUTANEOUS | Status: AC
  Filled 2018-03-11: qty 1.7

## 2018-03-11 MED ORDER — DENOSUMAB 120 MG/1.7ML ~~LOC~~ SOLN
120.0000 mg | Freq: Once | SUBCUTANEOUS | Status: AC
  Administered 2018-03-11: 120 mg via SUBCUTANEOUS

## 2018-03-11 MED ORDER — FULVESTRANT 250 MG/5ML IM SOLN
INTRAMUSCULAR | Status: AC
  Filled 2018-03-11: qty 10

## 2018-03-11 MED FILL — IBRANCE 100 MG CAPSULE: 100 | 28 days supply | Qty: 21 | Fill #2

## 2018-03-12 LAB — CANCER ANTIGEN 27.29: CA 27.29: 145.5 U/mL — ABNORMAL HIGH (ref 0.0–38.6)

## 2018-03-28 ENCOUNTER — Telehealth: Payer: Self-pay

## 2018-03-28 ENCOUNTER — Other Ambulatory Visit: Payer: Self-pay | Admitting: Adult Health

## 2018-03-28 DIAGNOSIS — E875 Hyperkalemia: Secondary | ICD-10-CM

## 2018-03-28 NOTE — Telephone Encounter (Signed)
Oral Oncology Patient Advocate Encounter  [name], the patients daughter emailed Theresa Wallace asking about her current grant expiring. Theresa Wallace forwarded me the email and I was successful at securing a grant with [facility] [name]) for $5500. This will keep the out of pocket expense for [place] at $0. The grant information is as follows and has been shared with [name].  Approval dates: 12/28/17-03/28/19 ID: [id number] Group: [id number] BIN: [id number] PCN: [id number]  I emailed [name] back and gave her this information.  [name] [facility] [place] Phone [phone] Fax [phone]

## 2018-04-01 ENCOUNTER — Other Ambulatory Visit: Payer: Self-pay

## 2018-04-01 ENCOUNTER — Encounter (HOSPITAL_COMMUNITY): Payer: Self-pay | Admitting: Emergency Medicine

## 2018-04-01 ENCOUNTER — Encounter: Payer: Self-pay | Admitting: Oncology

## 2018-04-01 ENCOUNTER — Other Ambulatory Visit: Payer: Self-pay | Admitting: Adult Health

## 2018-04-01 ENCOUNTER — Emergency Department (HOSPITAL_COMMUNITY): Payer: Medicare Other

## 2018-04-01 ENCOUNTER — Encounter: Payer: Self-pay | Admitting: Adult Health

## 2018-04-01 ENCOUNTER — Inpatient Hospital Stay (HOSPITAL_COMMUNITY)
Admission: EM | Admit: 2018-04-01 | Discharge: 2018-04-04 | DRG: 640 | Disposition: A | Discharge status: Home Health | Payer: Medicare Other | Attending: Family Medicine | Admitting: Family Medicine

## 2018-04-01 DIAGNOSIS — Z66 Do not resuscitate: Secondary | ICD-10-CM | POA: Diagnosis present

## 2018-04-01 DIAGNOSIS — G9341 Metabolic encephalopathy: Secondary | ICD-10-CM | POA: Diagnosis present

## 2018-04-01 DIAGNOSIS — C799 Secondary malignant neoplasm of unspecified site: Secondary | ICD-10-CM | POA: Diagnosis present

## 2018-04-01 DIAGNOSIS — E875 Hyperkalemia: Secondary | ICD-10-CM

## 2018-04-01 DIAGNOSIS — D638 Anemia in other chronic diseases classified elsewhere: Secondary | ICD-10-CM | POA: Diagnosis present

## 2018-04-01 DIAGNOSIS — N183 Chronic kidney disease, stage 3 unspecified: Secondary | ICD-10-CM

## 2018-04-01 DIAGNOSIS — R4182 Altered mental status, unspecified: Secondary | ICD-10-CM | POA: Diagnosis present

## 2018-04-01 DIAGNOSIS — E871 Hypo-osmolality and hyponatremia: Secondary | ICD-10-CM | POA: Diagnosis not present

## 2018-04-01 DIAGNOSIS — Z91018 Allergy to other foods: Secondary | ICD-10-CM

## 2018-04-01 DIAGNOSIS — Z90710 Acquired absence of both cervix and uterus: Secondary | ICD-10-CM | POA: Diagnosis not present

## 2018-04-01 DIAGNOSIS — Z9049 Acquired absence of other specified parts of digestive tract: Secondary | ICD-10-CM | POA: Diagnosis not present

## 2018-04-01 DIAGNOSIS — Z7982 Long term (current) use of aspirin: Secondary | ICD-10-CM | POA: Diagnosis not present

## 2018-04-01 DIAGNOSIS — I10 Essential (primary) hypertension: Secondary | ICD-10-CM | POA: Diagnosis not present

## 2018-04-01 DIAGNOSIS — Z803 Family history of malignant neoplasm of breast: Secondary | ICD-10-CM | POA: Diagnosis not present

## 2018-04-01 DIAGNOSIS — Z79899 Other long term (current) drug therapy: Secondary | ICD-10-CM

## 2018-04-01 DIAGNOSIS — J449 Chronic obstructive pulmonary disease, unspecified: Secondary | ICD-10-CM | POA: Diagnosis present

## 2018-04-01 DIAGNOSIS — Z87891 Personal history of nicotine dependence: Secondary | ICD-10-CM | POA: Diagnosis not present

## 2018-04-01 DIAGNOSIS — Z888 Allergy status to other drugs, medicaments and biological substances status: Secondary | ICD-10-CM | POA: Diagnosis not present

## 2018-04-01 DIAGNOSIS — I129 Hypertensive chronic kidney disease with stage 1 through stage 4 chronic kidney disease, or unspecified chronic kidney disease: Secondary | ICD-10-CM | POA: Diagnosis present

## 2018-04-01 DIAGNOSIS — Z79891 Long term (current) use of opiate analgesic: Secondary | ICD-10-CM

## 2018-04-01 DIAGNOSIS — Z853 Personal history of malignant neoplasm of breast: Secondary | ICD-10-CM | POA: Diagnosis not present

## 2018-04-01 DIAGNOSIS — E869 Volume depletion, unspecified: Secondary | ICD-10-CM | POA: Diagnosis present

## 2018-04-01 LAB — BASIC METABOLIC PANEL
Anion gap: 8 (ref 5–15)
BUN: 27 mg/dL — ABNORMAL HIGH (ref 8–23)
CO2: 25 mmol/L (ref 22–32)
Calcium: 8.8 mg/dL — ABNORMAL LOW (ref 8.9–10.3)
Chloride: 86 mmol/L — ABNORMAL LOW (ref 98–111)
Creatinine, Ser: 1.5 mg/dL — ABNORMAL HIGH (ref 0.44–1.00)
GFR calc Af Amer: 34 mL/min — ABNORMAL LOW (ref >60)
GFR calc non Af Amer: 29 mL/min — ABNORMAL LOW (ref >60)
Glucose, Bld: 164 mg/dL — ABNORMAL HIGH (ref 70–99)
Potassium: 4 mmol/L (ref 3.5–5.1)
Sodium: 119 mmol/L — CL (ref 135–145)

## 2018-04-01 LAB — CBC WITH DIFFERENTIAL/PLATELET
Basophils Absolute: 0 10*3/uL (ref 0.0–0.1)
Basophils Relative: 1 %
Eosinophils Absolute: 0 10*3/uL (ref 0.0–0.5)
Eosinophils Relative: 1 %
HCT: 29.4 % — ABNORMAL LOW (ref 36.0–46.0)
Hemoglobin: 10.5 g/dL — ABNORMAL LOW (ref 12.0–15.0)
Lymphocytes Relative: 19 %
Lymphs Abs: 0.7 10*3/uL (ref 0.7–4.0)
MCH: 34.5 pg — ABNORMAL HIGH (ref 26.0–34.0)
MCHC: 35.7 g/dL (ref 30.0–36.0)
MCV: 96.7 fL (ref 80.0–100.0)
Monocytes Absolute: 0.1 10*3/uL (ref 0.1–1.0)
Monocytes Relative: 3 %
Neutro Abs: 2.7 10*3/uL (ref 1.7–7.7)
Neutrophils Relative %: 76 %
Platelets: 139 10*3/uL — ABNORMAL LOW (ref 150–400)
RBC: 3.04 MIL/uL — ABNORMAL LOW (ref 3.87–5.11)
RDW: 12.7 % (ref 11.5–15.5)
WBC: 3.5 10*3/uL — ABNORMAL LOW (ref 4.0–10.5)
nRBC: 0 % (ref 0.0–0.2)
nRBC: 0 /100 WBC

## 2018-04-01 LAB — URINALYSIS, ROUTINE W REFLEX MICROSCOPIC
Bilirubin Urine: NEGATIVE
Glucose, UA: NEGATIVE mg/dL
Hgb urine dipstick: NEGATIVE
Ketones, ur: NEGATIVE mg/dL
Leukocytes, UA: NEGATIVE
Nitrite: NEGATIVE
Protein, ur: NEGATIVE mg/dL
Specific Gravity, Urine: 1.009 (ref 1.005–1.030)
pH: 5 (ref 5.0–8.0)

## 2018-04-01 LAB — OSMOLALITY: Osmolality: 260 mOsm/kg — ABNORMAL LOW (ref 275–295)

## 2018-04-01 LAB — TSH: TSH: 4.43 u[IU]/mL (ref 0.350–4.500)

## 2018-04-01 LAB — SODIUM, URINE, RANDOM: SODIUM UR: 83 mmol/L

## 2018-04-01 MED ORDER — ONDANSETRON HCL 4 MG PO TABS: 4.0000 mg | ORAL_TABLET | Freq: Four times a day (QID) | ORAL | Status: DC | PRN

## 2018-04-01 MED ORDER — GABAPENTIN 100 MG PO CAPS
100.0000 mg | ORAL_CAPSULE | Freq: Every day | ORAL | Status: DC
  Administered 2018-04-01 – 2018-04-03 (×3): 100 mg via ORAL
  Filled 2018-04-01 (×3): qty 1

## 2018-04-01 MED ORDER — ONDANSETRON HCL 4 MG/2ML IJ SOLN: 4.0000 mg | Freq: Four times a day (QID) | INTRAMUSCULAR | Status: DC | PRN

## 2018-04-01 MED ORDER — LEVOTHYROXINE SODIUM 25 MCG PO TABS
25.0000 ug | ORAL_TABLET | Freq: Every day | ORAL | Status: DC
  Administered 2018-04-02 – 2018-04-04 (×3): 25 ug via ORAL
  Filled 2018-04-01 (×3): qty 1

## 2018-04-01 MED ORDER — ASPIRIN 81 MG PO CHEW
81.0000 mg | CHEWABLE_TABLET | ORAL | Status: DC
  Administered 2018-04-01 – 2018-04-03 (×2): 81 mg via ORAL
  Filled 2018-04-01 (×2): qty 1

## 2018-04-01 MED ORDER — PANTOPRAZOLE SODIUM 40 MG PO TBEC
40.0000 mg | DELAYED_RELEASE_TABLET | Freq: Every day | ORAL | Status: DC
  Administered 2018-04-01 – 2018-04-04 (×4): 40 mg via ORAL
  Filled 2018-04-01 (×4): qty 1

## 2018-04-01 MED ORDER — PROPRANOLOL HCL ER 60 MG PO CP24
60.0000 mg | ORAL_CAPSULE | Freq: Every day | ORAL | Status: DC
  Administered 2018-04-02 – 2018-04-03 (×2): 60 mg via ORAL
  Filled 2018-04-01 (×5): qty 1

## 2018-04-01 MED ORDER — SODIUM CHLORIDE 0.9 % IV BOLUS
1000.0000 mL | Freq: Once | INTRAVENOUS | Status: AC
  Administered 2018-04-01: 1000 mL via INTRAVENOUS

## 2018-04-01 MED ORDER — PALBOCICLIB 100 MG PO CAPS
100.0000 mg | ORAL_CAPSULE | Freq: Every day | ORAL | Status: AC
  Administered 2018-04-02: 100 mg via ORAL

## 2018-04-01 MED ORDER — SODIUM CHLORIDE 0.9 % IV SOLN
INTRAVENOUS | Status: DC
  Administered 2018-04-01 – 2018-04-04 (×4): via INTRAVENOUS

## 2018-04-01 MED ORDER — LOSARTAN POTASSIUM 25 MG PO TABS
25.0000 mg | ORAL_TABLET | Freq: Every day | ORAL | Status: DC
  Administered 2018-04-01 – 2018-04-04 (×4): 25 mg via ORAL
  Filled 2018-04-01 (×4): qty 1

## 2018-04-01 MED ORDER — FLUCONAZOLE 150 MG PO TABS
150.0000 mg | ORAL_TABLET | Freq: Every day | ORAL | Status: DC | PRN
  Filled 2018-04-01: qty 1

## 2018-04-01 MED ORDER — HEPARIN SODIUM (PORCINE) 5000 UNIT/ML IJ SOLN
5000.0000 [IU] | Freq: Three times a day (TID) | INTRAMUSCULAR | Status: DC
  Administered 2018-04-01 – 2018-04-04 (×8): 5000 [IU] via SUBCUTANEOUS
  Filled 2018-04-01 (×7): qty 1

## 2018-04-01 MED ORDER — KETOTIFEN FUMARATE 0.025 % OP SOLN
1.0000 [drp] | OPHTHALMIC | Status: DC | PRN
  Filled 2018-04-01: qty 5

## 2018-04-01 NOTE — ED Notes (Signed)
ED Provider at bedside. 

## 2018-04-01 NOTE — ED Notes (Signed)
Patient transported to CT 

## 2018-04-01 NOTE — ED Notes (Signed)
EDP notified of critical lab value.

## 2018-04-01 NOTE — H&P (Addendum)
[facility]                                                                                    Patient Demographics  Theresa Wallace, is a 84 y.o. female  CSN: [id number]  MRN: [id number]  DOB - 1924-02-16  Admit Date - 04/01/2018  Outpatient Primary MD for the patient is [name], MD   With History of -  Past Medical History:  Diagnosis Date  . Breast cancer metastasized to multiple sites ([place]) 06/07/2011  . Cancer ([place])    breast ca  . Hypertension       Past Surgical History:  Procedure Laterality Date  . ABDOMINAL HYSTERECTOMY    . BREAST LUMPECTOMY    . CATARACT EXTRACTION    . CHOLECYSTECTOMY    . KNEE ARTHROSCOPY      in for   Chief Complaint  Patient presents with  . Altered Mental Status     HPI  Theresa Wallace  is a 84 y.o. female, past medical history significant for metastatic breast cancer, and hypertension who was recently treated for UTI.  She was taken extra water last week and for the last 3 days she has become more confused and weak.  Her sodium in the emergency room was 119.  Patient is on hydrochlorothiazide as well.  Patient was seen and examined and she denies any chest pains or shortness of breath , she just feels spaced out.    Review of Systems    In addition to the HPI above,  No Fever-chills, No Headache, No changes with Vision or hearing, No problems swallowing food or Liquids, No Chest pain, Cough or Shortness of Breath, No Abdominal pain, No Nausea or Vommitting, Bowel movements are regular, No Blood in stool or Urine, No dysuria, No new skin rashes or bruises, No new joints pains-aches,  No new weakness, tingling, numbness in any extremity, No recent weight gain or loss, No polyuria, polydypsia or polyphagia, No significant Mental Stressors.  A full 10 point Review of Systems was done, except as stated above, all other Review of Systems were negative.   Social History Social History   Tobacco Use  .  Smoking status: Former Smoker    Last attempt to quit: 05/22/1968    Years since quitting: 49.8  . Smokeless tobacco: Never Used  Substance Use Topics  . Alcohol use: Yes    Comment: 2 yearly     Family History Family History  Problem Relation Age of Onset  . Breast cancer Sister 57  . Breast cancer Cousin        dx in her 48s  . Bone cancer Mother      Prior to Admission medications   Medication Sig Start Date End Date Taking? Authorizing Provider  aspirin 81 MG tablet Take 81 mg by mouth every Monday,Wednesday,Friday, and Sunday at 6 PM.    Yes [provider]  Calcium Carbonate-Vitamin D (CALCIUM-VITAMIN D) 500-200 MG-UNIT per tablet Take 1 tablet by mouth 2 (two) times daily with a meal.   Yes [provider]  cholecalciferol (VITAMIN D) 400 UNITS TABS Take 2,000 Units by mouth daily.  Yes [provider]  denosumab (PROLIA) 60 MG/ML SOSY injection Inject into the skin.   Yes [provider]  esomeprazole (NEXIUM) 20 MG capsule Take 1 capsule (20 mg total) by mouth as needed. 07/30/17  Yes Magrinat, Virgie Dad, MD  fluconazole (DIFLUCAN) 150 MG tablet as needed. 08/13/15  Yes [provider]  gabapentin (NEURONTIN) 100 MG capsule Take 1 tab at bedtime and may increase to bid if needed 01/18/18  Yes Magrinat, Virgie Dad, MD  hydrochlorothiazide (HYDRODIURIL) 12.5 MG tablet TAKE ONE TABLET BY MOUTH DAILY 01/31/18  Yes Causey, Charlestine Massed, NP  IBRANCE 100 MG capsule TAKE 1 CAPSULE (100 MG TOTAL) BY MOUTH DAILY WITH BREAKFAST. TAKE FOR 21 DAYS ON, 7 DAYS OFF, REPEAT EVERY 28 DAYS. TAKE WHOLE WITH FOOD. 01/07/18  Yes Magrinat, Virgie Dad, MD  ketoconazole (NIZORAL) 2 % cream Apply 1 application topically daily as needed for irritation.  12/28/17  Yes [provider]  ketotifen (ZADITOR) 0.025 % ophthalmic solution Place 1 drop into both eyes as needed (itchy eyes).   Yes [provider]  levothyroxine (SYNTHROID, LEVOTHROID) 25 MCG  tablet Take 25 mcg by mouth daily.   Yes [name], MD  losartan (COZAAR) 50 MG tablet 25 mg. 10/17/12  Yes [provider]  metroNIDAZOLE (METROGEL) 0.75 % gel Apply topically 2 (two) times daily. For rosacea 12/27/15  Yes [provider]  Multiple Vitamin (MULTIVITAMIN) tablet Take 1 tablet by mouth daily.   Yes [provider]  naproxen sodium (ALEVE) 220 MG tablet Take 220 mg by mouth as needed (knee pain).   Yes [provider]  propranolol ER (INDERAL LA) 60 MG 24 hr capsule  02/19/13  Yes [provider]  cefUROXime (CEFTIN) 250 MG tablet Take 250 mg by mouth 2 (two) times daily. Finished 5 day regimen on 03-30-18 03/25/18   [provider]    Allergies  Allergen Reactions  . Cantaloupe (Diagnostic)   . Ciprofloxacin   . Epinephrine   . Pineapple   . Tamoxifen   . Mupirocin Rash    Physical Exam  Vitals  Blood pressure (!) 162/59, pulse (!) 59, temperature 97.9 F (36.6 C), temperature source Oral, resp. rate 17, SpO2 98 %.   1. General well-developed, well-nourished female, extremely pleasant  2. Normal affect and insight, Not Suicidal or Homicidal, Awake Alert, Oriented X 3.  3. No F.N deficits, grossly, patient moving all extremities.  4. Ears and Eyes appear Normal, Conjunctivae clear, PERRLA. Moist Oral Mucosa.  5. Supple Neck, No JVD, No cervical lymphadenopathy appriciated, No Carotid Bruits.  6. Symmetrical Chest wall movement, Good air movement bilaterally, CTAB.  7. RRR, No Gallops, Rubs or Murmurs, No Parasternal Heave.  8. Positive Bowel Sounds, Abdomen Soft, Non tender, No organomegaly appriciated,No rebound -guarding or rigidity.  9.  No Cyanosis, Normal Skin Turgor, No Skin Rash or Bruise.  10. Good muscle tone,  joints appear normal , +1 edema bilaterally.    Data Review  CBC Recent Labs  Lab 04/01/18 1140  WBC 3.5*  HGB 10.5*  HCT 29.4*  PLT 139*  MCV 96.7  MCH 34.5*  MCHC 35.7   RDW 12.7  LYMPHSABS 0.7  MONOABS 0.1  EOSABS 0.0  BASOSABS 0.0   ------------------------------------------------------------------------------------------------------------------  Chemistries  Recent Labs  Lab 04/01/18 1140  NA 119*  K 4.0  CL 86*  CO2 25  GLUCOSE 164*  BUN 27*  CREATININE 1.50*  CALCIUM 8.8*   ------------------------------------------------------------------------------------------------------------------ CrCl cannot be  calculated (Unknown ideal weight.). ------------------------------------------------------------------------------------------------------------------ No results for input(s): TSH, T4TOTAL, T3FREE, THYROIDAB in the last 72 hours.  Invalid input(s): FREET3   Coagulation profile No results for input(s): INR, PROTIME in the last 168 hours. ------------------------------------------------------------------------------------------------------------------- No results for input(s): DDIMER in the last 72 hours. -------------------------------------------------------------------------------------------------------------------  Cardiac Enzymes No results for input(s): CKMB, TROPONINI, MYOGLOBIN in the last 168 hours.  Invalid input(s): CK ------------------------------------------------------------------------------------------------------------------ Invalid input(s): POCBNP   ---------------------------------------------------------------------------------------------------------------  Urinalysis    Component Value Date/Time   COLORURINE YELLOW 04/01/2018 1502   APPEARANCEUR CLEAR 04/01/2018 1502   LABSPEC 1.009 04/01/2018 1502   PHURINE 5.0 04/01/2018 1502   GLUCOSEU NEGATIVE 04/01/2018 1502   HGBUR NEGATIVE 04/01/2018 1502   BILIRUBINUR NEGATIVE 04/01/2018 1502   KETONESUR NEGATIVE 04/01/2018 1502   PROTEINUR NEGATIVE 04/01/2018 1502   NITRITE NEGATIVE 04/01/2018 1502   LEUKOCYTESUR NEGATIVE 04/01/2018 1502     ----------------------------------------------------------------------------------------------------------------   Imaging results:   Dg Chest 2 View  Result Date: 04/01/2018 CLINICAL DATA:  Altered mental status, and weakness, history of a fall last Friday. The patient has completed antibiotics for recent urinary tract infection. History of metastatic breast malignancy, COPD, former smoker. EXAM: CHEST - 2 VIEW COMPARISON:  PA and lateral chest x-ray of September 11, 2016 FINDINGS: The lungs are adequately inflated. There is no focal infiltrate. There is no pleural effusion. The heart is top-normal in size. The pulmonary vascularity is normal. There is calcification in the wall of the aortic arch. The bony thorax exhibits no acute abnormality. There are degenerative changes of the left shoulder. There is chronic mild compression of a lower thoracic vertebral body. IMPRESSION: There is no pneumonia nor other acute cardiopulmonary abnormality. Thoracic aortic atherosclerosis. Electronically Signed   By: David  Martinique M.D.   On: 04/01/2018 11:57   Ct Head Wo Contrast  Result Date: 04/01/2018 CLINICAL DATA:  Worsening confusion beginning yesterday. History of metastatic breast cancer. EXAM: CT HEAD WITHOUT CONTRAST TECHNIQUE: Contiguous axial images were obtained from the base of the skull through the vertex without intravenous contrast. COMPARISON:  06/12/2017 FINDINGS: Brain: There is no evidence of acute infarct, intracranial hemorrhage, mass, midline shift, or extra-axial fluid collection. Mild cerebral atrophy is unchanged. Cerebral white matter hypodensities are nonspecific but compatible with chronic small vessel ischemic disease, minimal for age. Vascular: Calcified atherosclerosis at the skull base. No hyperdense vessel. Skull: No fracture or focal osseous lesion. Sinuses/Orbits: Partially visualized small right maxillary sinus mucous retention cyst. Clear mastoid air cells. Bilateral cataract  extraction. Other: None. IMPRESSION: No evidence of acute intracranial abnormality. Electronically Signed   By: Logan Bores M.D.   On: 04/01/2018 13:52    My personal review of EKG: Rhythm NSR, 64 bpm, nonspecific ST wave and T wave changes  Assessment & Plan  Hyponatremia; multifactorial Probably due to increase fluid intake after UTI versus hydrochlorothiazide or hypothyroidism Check TSH Fluid restriction IV fluids normal saline Check sodium in a.m. Hold hydrochlorothiazide  Metastatic breast cancer on Ibrance  Hypertension On losartan  DVT Prophylaxis heparin  AM Labs Ordered, also please review Full Orders  Family Communication: Admission, patients condition and plan of care including tests being ordered have been discussed with the patient and daughter who indicate understanding and agree with the plan and Code Status.  Code Status DNR  Disposition Plan: Home  Time spent in minutes : 42 minutes  Condition GUARDED   @SIGNATURE @

## 2018-04-01 NOTE — ED Provider Notes (Signed)
[facility] EMERGENCY DEPARTMENT Provider Note   CSN: [id number] Arrival date & time: 04/01/18  1111     History   Chief Complaint Chief Complaint  Patient presents with  . Altered Mental Status    HPI Theresa Wallace is a 84 y.o. female.   HPI   84 year old female with a history of metastatic breast cancer presents today with complaints of altered mental status.  Patient's daughter and granddaughter at bedside to provide majority of history.  They note the patient's baseline mental status is clear and concise with no deficits, she performs all daily functions on her own.  They note approximately 1 week ago she was diagnosed with a urinary tract infection after having urinary symptoms.  At that time patient was placed on cefuroxime for a 5-day course, and had her urine cultured. (Culture results were available noting that E. coli was pansensitive with the exception of ampicillin) they note that 4 days ago she had an episode of nausea vomiting and diarrhea, none since.  They note since that time she has been slightly altered, confused, and has been able to take care of herself.  They deny any physical deficits, denies any fever or subsequent episodes of nausea or vomiting.  They note decreased p.o. intake secondary to recent urinary tract infection.  They deny any history of stroke, no blood thinners.  He notes several very minor falls where patient gently fell to her knees no history of head trauma.   Past Medical History:  Diagnosis Date  . Breast cancer metastasized to multiple sites ([place]) 06/07/2011  . Cancer ([place])    breast ca  . Hypertension     Patient Active Problem List   Diagnosis Date Noted  . Goals of care, counseling/discussion 08/27/2017  . Malignant neoplasm of overlapping sites of left breast in female, estrogen receptor positive ([place]) 09/11/2016  . Cancer of left breast ([place]) 09/13/2015  . Sensorineural hearing loss (SNHL), bilateral 08/23/2015    . Genetic testing 12/18/2014  . COPD (chronic obstructive pulmonary disease) ([place]) 09/07/2014  . Breast cancer metastasized to multiple sites [facility]) 06/07/2011    Past Surgical History:  Procedure Laterality Date  . ABDOMINAL HYSTERECTOMY    . BREAST LUMPECTOMY    . CATARACT EXTRACTION    . CHOLECYSTECTOMY    . KNEE ARTHROSCOPY       OB History   None      Home Medications    Prior to Admission medications   Medication Sig Start Date End Date Taking? Authorizing Provider  aspirin 81 MG tablet Take 81 mg by mouth daily.    [provider]  Calcium Carbonate-Vitamin D (CALCIUM-VITAMIN D) 500-200 MG-UNIT per tablet Take 1 tablet by mouth 2 (two) times daily with a meal.    [provider]  cholecalciferol (VITAMIN D) 400 UNITS TABS Take 2,000 Units by mouth daily.    [provider]  denosumab (PROLIA) 60 MG/ML SOSY injection Inject into the skin.    [provider]  esomeprazole (NEXIUM) 20 MG capsule Take 1 capsule (20 mg total) by mouth as needed. 07/30/17   Magrinat, Virgie Dad, MD  fluconazole (DIFLUCAN) 150 MG tablet as needed. 08/13/15   [provider]  gabapentin (NEURONTIN) 100 MG capsule Take 1 tab at bedtime and may increase to bid if needed 01/18/18   Magrinat, Virgie Dad, MD  Glucosamine HCl-MSM (GLUCOSAMINE-MSM PO) Take 1 tablet by mouth daily.    [provider]  hydrochlorothiazide (HYDRODIURIL) 12.5  MG tablet TAKE ONE TABLET BY MOUTH DAILY 01/31/18   Causey, Charlestine Massed, NP  IBRANCE 100 MG capsule TAKE 1 CAPSULE (100 MG TOTAL) BY MOUTH DAILY WITH BREAKFAST. TAKE FOR 21 DAYS ON, 7 DAYS OFF, REPEAT EVERY 28 DAYS. TAKE WHOLE WITH FOOD. 01/07/18   Magrinat, Virgie Dad, MD  ketoconazole (NIZORAL) 2 % cream  12/28/17   [provider]  levothyroxine (SYNTHROID, LEVOTHROID) 25 MCG tablet Take 25 mcg by mouth daily.    [name], MD  losartan (COZAAR) 50 MG tablet 25 mg. 10/17/12   [provider]   metroNIDAZOLE (METROGEL) 0.75 % gel Apply topically 2 (two) times daily. For rosacea 12/27/15   [provider]  Multiple Vitamin (MULTIVITAMIN) tablet Take 1 tablet by mouth daily.    [provider]  propranolol ER (INDERAL LA) 60 MG 24 hr capsule  02/19/13   [provider]  triamcinolone cream (KENALOG) 0.1 % as needed. 03/10/16   [provider]    Family History Family History  Problem Relation Age of Onset  . Breast cancer Sister 18  . Breast cancer Cousin        dx in her 12s  . Bone cancer Mother     Social History Social History   Tobacco Use  . Smoking status: Former Smoker    Last attempt to quit: 05/22/1968    Years since quitting: 49.8  . Smokeless tobacco: Never Used  Substance Use Topics  . Alcohol use: Yes    Comment: 2 yearly  . Drug use: No     Allergies   Cantaloupe (diagnostic); Ciprofloxacin; Epinephrine; Pineapple; Tamoxifen; and Mupirocin   Review of Systems Review of Systems  All other systems reviewed and are negative.   Physical Exam Updated Vital Signs BP (!) 152/65   Pulse (!) 58   Temp 97.9 F (36.6 C) (Oral)   Resp 16   SpO2 100%   Physical Exam  Constitutional: She appears well-developed and well-nourished.  HENT:  Head: Normocephalic and atraumatic.  Eyes: Pupils are equal, round, and reactive to light. Conjunctivae are normal. Right eye exhibits no discharge. Left eye exhibits no discharge. No scleral icterus.  Neck: Normal range of motion. No JVD present. No tracheal deviation present.  Pulmonary/Chest: Effort normal. No stridor.  Neurological: She is alert. No cranial nerve deficit or sensory deficit. She exhibits normal muscle tone. Coordination normal.  Patient alert to person and place- slow to respond   Psychiatric: She has a normal mood and affect. Her behavior is normal. Judgment and thought content normal.  Nursing note and vitals reviewed.   ED Treatments / Results  Labs (all  labs ordered are listed, but only abnormal results are displayed) Labs Reviewed  CBC WITH DIFFERENTIAL/PLATELET - Abnormal; Notable for the following components:      Result Value   WBC 3.5 (*)    RBC 3.04 (*)    Hemoglobin 10.5 (*)    HCT 29.4 (*)    MCH 34.5 (*)    Platelets 139 (*)    All other components within normal limits  BASIC METABOLIC PANEL - Abnormal; Notable for the following components:   Sodium 119 (*)    Chloride 86 (*)    Glucose, Bld 164 (*)    BUN 27 (*)    Creatinine, Ser 1.50 (*)    Calcium 8.8 (*)    GFR calc non Af Amer 29 (*)    GFR calc Af Amer 34 (*)  All other components within normal limits  URINE CULTURE  URINALYSIS, ROUTINE W REFLEX MICROSCOPIC  SODIUM, URINE, RANDOM  OSMOLALITY    EKG None  Radiology Dg Chest 2 View  Result Date: 04/01/2018 CLINICAL DATA:  Altered mental status, and weakness, history of a fall last Friday. The patient has completed antibiotics for recent urinary tract infection. History of metastatic breast malignancy, COPD, former smoker. EXAM: CHEST - 2 VIEW COMPARISON:  PA and lateral chest x-ray of September 11, 2016 FINDINGS: The lungs are adequately inflated. There is no focal infiltrate. There is no pleural effusion. The heart is top-normal in size. The pulmonary vascularity is normal. There is calcification in the wall of the aortic arch. The bony thorax exhibits no acute abnormality. There are degenerative changes of the left shoulder. There is chronic mild compression of a lower thoracic vertebral body. IMPRESSION: There is no pneumonia nor other acute cardiopulmonary abnormality. Thoracic aortic atherosclerosis. Electronically Signed   By: David  Martinique M.D.   On: 04/01/2018 11:57   Ct Head Wo Contrast  Result Date: 04/01/2018 CLINICAL DATA:  Worsening confusion beginning yesterday. History of metastatic breast cancer. EXAM: CT HEAD WITHOUT CONTRAST TECHNIQUE: Contiguous axial images were obtained from the base of the  skull through the vertex without intravenous contrast. COMPARISON:  06/12/2017 FINDINGS: Brain: There is no evidence of acute infarct, intracranial hemorrhage, mass, midline shift, or extra-axial fluid collection. Mild cerebral atrophy is unchanged. Cerebral white matter hypodensities are nonspecific but compatible with chronic small vessel ischemic disease, minimal for age. Vascular: Calcified atherosclerosis at the skull base. No hyperdense vessel. Skull: No fracture or focal osseous lesion. Sinuses/Orbits: Partially visualized small right maxillary sinus mucous retention cyst. Clear mastoid air cells. Bilateral cataract extraction. Other: None. IMPRESSION: No evidence of acute intracranial abnormality. Electronically Signed   By: Logan Bores M.D.   On: 04/01/2018 13:52    Procedures Procedures (including critical care time)   CRITICAL CARE Performed by: Stevie Kern Salihah Peckham   Total critical care time: 35 minutes  Critical care time was exclusive of separately billable procedures and treating other patients.  Critical care was necessary to treat or prevent imminent or life-threatening deterioration.  Critical care was time spent personally by me on the following activities: development of treatment plan with patient and/or surrogate as well as nursing, discussions with consultants, evaluation of patient's response to treatment, examination of patient, obtaining history from patient or surrogate, ordering and performing treatments and interventions, ordering and review of laboratory studies, ordering and review of radiographic studies, pulse oximetry and re-evaluation of patient's condition.  Medications Ordered in ED Medications  sodium chloride 0.9 % bolus 1,000 mL (1,000 mLs Intravenous New Bag/Given 04/01/18 1225)     Initial Impression / Assessment and Plan / ED Course  I have reviewed the triage vital signs and the nursing notes.  Pertinent labs & imaging results that were  available during my care of the patient were reviewed by me and considered in my medical decision making (see chart for details).     Labs: CBC, BMP and urinalysis  Imaging: CT head without, DG chest 2 view  Consults:  Therapeutics: Normal saline  Discharge Meds:   Assessment/Plan: 84 year old female presents today with altered mental status.  High suspicion for urinary tract infection given her recent UTI.  She has no acute signs of stroke, she does appear to be slightly confused she does offer her baseline.  Patient will have infectious work-up along with CT, will be seen by  attending physician pending urine, CT and admission.   Final Clinical Impressions(s) / ED Diagnoses   Final diagnoses:  Altered mental status, unspecified altered mental status type  Hyponatremia    ED Discharge Orders    None       Okey Regal, PA-C 04/01/18 [place], MD 04/01/18 1546

## 2018-04-01 NOTE — ED Triage Notes (Signed)
Pt presents with ams and weakness she fell Friday. Hx of UTI last week taking antibiotics which were completed. Per family, the patient is not her normal self. She is not eating and drinking as well. No slurred speech or facial drooping. Pt is alert but confused.

## 2018-04-01 NOTE — ED Notes (Signed)
Patient transported to X-ray 

## 2018-04-01 NOTE — ED Notes (Signed)
Call to [phone]

## 2018-04-02 LAB — URINE CULTURE: Culture: <10000 — AB

## 2018-04-02 LAB — BASIC METABOLIC PANEL
ANION GAP: 7 (ref 5–15)
BUN: 24 mg/dL — ABNORMAL HIGH (ref 8–23)
CALCIUM: 8 mg/dL — AB (ref 8.9–10.3)
CO2: 21 mmol/L — AB (ref 22–32)
CREATININE: 1.48 mg/dL — AB (ref 0.44–1.00)
Chloride: 93 mmol/L — ABNORMAL LOW (ref 98–111)
GFR calc Af Amer: 34 mL/min — ABNORMAL LOW (ref >60)
GFR, EST NON AFRICAN AMERICAN: 30 mL/min — AB (ref >60)
Glucose, Bld: 98 mg/dL (ref 70–99)
Potassium: 3.7 mmol/L (ref 3.5–5.1)
Sodium: 121 mmol/L — ABNORMAL LOW (ref 135–145)

## 2018-04-02 NOTE — Evaluation (Signed)
Physical Therapy Evaluation Patient Details Name: Theresa Wallace MRN: [id number] DOB: June 10, 1923 Today's Date: 04/02/2018   History of Present Illness  Pt presents to hospital with hyponatremia and impaired cognition. She has a significant past medical history of hypertension, cataract surgeries and treatment for a UTI. Denies any shortness of breath. Tested negative for diabetes.    Clinical Impression  Pt presents to physical therapy today with generalized weakness and with daughter in the room. Patient had difficulty following cues to perform MMT and poor awareness of how long she had been in the hospital, demonstrating impaired cognition that is at baseline per daughter. She demonstrated baseline strength and gait speed per daughter. Slow gait speed, decreased strength and LOB while standing and peri-care show increased risk for falls during mod independent ambulation when she returns home. Patient has three stairs into house that daughter helps her get up by holding onto walker at the top of the stairs. Assessement of patient's stair climbing and education on safety needs to be provided to family for proper stair navigation. Skilled physical therapy intervention is necessary to prevent potential deconditioning and address strength and mobility goals for patient safety.     Follow Up Recommendations Supervision for mobility/OOB;Home health PT    Equipment Recommendations  None recommended by PT    Recommendations for Other Services       Precautions / Restrictions Precautions Precautions: None Restrictions Weight Bearing Restrictions: No      Mobility  Bed Mobility Overal bed mobility: Needs Assistance Bed Mobility: Supine to Sit     Supine to sit: Supervision     General bed mobility comments: Supervision  Transfers Overall transfer level: Needs assistance Equipment used: Rolling walker (2 wheeled) Transfers: Sit to/from Stand Sit to Stand: Min assist;Min guard          General transfer comment: Pt had one LOB when wiping and standing in front of bedside commode. Verbal cues to push up from sides of bed as opposed to reaching for walker.   Ambulation/Gait Ambulation/Gait assistance: Min guard Gait Distance (Feet): 125 Feet Assistive device: Rolling walker (2 wheeled) Gait Pattern/deviations: Step-to pattern Gait velocity: At baseline per daughter Gait velocity interpretation: <1.31 ft/sec, indicative of household ambulator General Gait Details: At baseline per daughter  Financial trader [employer] (Stroke Patients Only)       Balance Overall balance assessment: Mild deficits observed, not formally tested                                           Pertinent Vitals/Pain Pain Assessment: Faces Faces Pain Scale: Hurts a little bit Pain Location: In left shoulder, left knee during MMT. Pt pointed out she had a controlled fall the other day at home and bruised her shin     Home Living Family/patient expects to be discharged to:: Private residence Living Arrangements: Children Available Help at Discharge: Family Type of Home: House Home Access: Stairs to enter Entrance Stairs-Rails: Right Entrance Stairs-Number of Steps: [place]: One [place]: [place] - 2 wheels;Toilet riser Additional Comments: rails in shower, no chair. uses counter to help her get up off toilet.    Prior Function Level of Independence: Independent with assistive device(s)   Gait / Transfers Assistance Needed: walked to and from bathroom, around house with  walker, used walker and daughter's assistance to ascend/descend stairs            Hand Dominance        Extremity/Trunk Assessment   Upper Extremity Assessment Upper Extremity Assessment: LUE deficits/detail;Generalized weakness LUE Deficits / Details: elbow flexion and extension 4/5 Bilateral. Right shoulder flexion and abduction 4/5.  Can't raise L arm over 80* flexion or abduction, c/o pain in joint LUE: Unable to fully assess due to pain    Lower Extremity Assessment Lower Extremity Assessment: Overall WFL for tasks assessed;Difficult to assess due to impaired cognition;Generalized weakness;RLE deficits/detail;LLE deficits/detail RLE Deficits / Details: Hip flexors 4/5 bilateral and quadriceps 5/5 bilateral, hamstrings unable to be effectively assessed due to impaired cognition/HOH RLE Sensation: history of peripheral neuropathy LLE Sensation: history of peripheral neuropathy       Communication   Communication: HOH  Cognition Arousal/Alertness: Awake/alert Behavior During Therapy: Impulsive Overall Cognitive Status: History of cognitive impairments - at baseline                                 General Comments: AnOx3, patient knew she was present for hyponatremia but thought she had been in the hospital for 4-5 days as opposed to 1. Daughter present for corrections and information on baseline. Mildly impulsive, required repetiitive verbal cues.       General Comments General comments (skin integrity, edema, etc.): LOB while standing and wiping next to bedside commode    Exercises     Assessment/Plan    PT Assessment Patient needs continued PT services  PT Problem List Decreased strength;Decreased mobility;Decreased safety awareness;Decreased activity tolerance;Decreased cognition;Decreased balance;Pain;Decreased range of motion       PT Treatment Interventions Gait training;Therapeutic activities;Cognitive remediation;Therapeutic exercise;Patient/family education;Stair training;Balance training;Functional mobility training;Neuromuscular re-education    PT Goals (Current goals can be found in the Care Plan section)  Acute Rehab PT Goals Patient Stated Goal: Would like to return home, amb around house with walker and ascend/descend stairs with family members min assist one week from today  PT  Goal Formulation: With patient/family Time For Goal Achievement: 04/09/18 Potential to Achieve Goals: Good    Frequency Min 3X/week   Barriers to discharge        Co-evaluation               AM-PAC PT "6 Clicks" Daily Activity  Outcome Measure Difficulty turning over in bed (including adjusting bedclothes, sheets and blankets)?: None Difficulty moving from lying on back to sitting on the side of the bed? : None Difficulty sitting down on and standing up from a chair with arms (e.g., wheelchair, bedside commode, etc,.)?: A Little Help needed moving to and from a bed to chair (including a wheelchair)?: A Little Help needed walking in hospital room?: A Little Help needed climbing 3-5 steps with a railing? : A Lot 6 Click Score: 2    End of Session Equipment Utilized During Treatment: Gait belt Activity Tolerance: Patient tolerated treatment well Patient left: in chair;with family/visitor present;with chair alarm set Nurse Communication: Mobility status PT Visit Diagnosis: Muscle weakness (generalized) (M62.81);Difficulty in walking, not elsewhere classified (R26.2)    Time: 5462-7035 PT Time Calculation (min) (ACUTE ONLY): 44 min   Charges:   PT Evaluation $PT Eval Moderate Complexity: 1 Mod PT Treatments $Gait Training: 23-37 mins       Gilda Crease, SPT Acute Rehab [phone]  Gilda Crease 04/02/2018, 2:16 PM

## 2018-04-02 NOTE — Progress Notes (Signed)
PROGRESS NOTE    Theresa Wallace  PIR:[id number] DOB: 1923/07/02 DOA: 04/01/2018 PCP: [name], MD    Brief Narrative:  Theresa Wallace  is a 84 y.o. female, past medical history significant for metastatic breast cancer, and hypertension who was recently treated for UTI.  She was taken extra water last week and for the last 3 days she has become more confused and weak.  Her sodium in the emergency room was 119.  Patient is on hydrochlorothiazide as well. She was admitted for acute encephalopathy.   Assessment & Plan:   Active Problems:   Hyponatremia   Acute metabolic encephalopathy probably from hyponatremia possibly from HCTZ USE and free water intake.  - hold the HCTZ. And strict intake and output.  - recheck sodium in the morning.  - her mental status has improved. She is alert and answering some simple questions. But the daughter feels patient is not back to her baseline mental status yet.  - will monitor for another 24 hours to see if she can return to her normal mental status with the improvement of the sodium.   - her UA Is negative  - CT head is neg for acute stroke.    Hypertension Well controlled.   Metastatic breast cancer: Pt would like to see Dr Jana Hakim, will put in a message to Dr Jana Hakim.   Stage 3 CKD: Creatinine at baseline.    Anemia of chronic disease: Hemoglobin stable around 10.    DVT prophylaxis:  Code Status: DNR Family Communication: daughter at bedside.  Disposition Plan: pending clinical improvement. And improvement in the sodium.    Consultants:  None.   Procedures: none.   Antimicrobials: none.   Subjective: No chest pain or sob, no nausea or vomiting or abdominal pain.   Objective: Vitals:   04/01/18 2138 04/02/18 0535 04/02/18 0942 04/02/18 1718  BP: 126/65 (!) 135/56 (!) 121/51 137/68  Pulse: 70 64 62 69  Resp: 18 18 18 18   Temp: 98.1 F (36.7 C) 98.2 F (36.8 C) 97.6 F (36.4 C) 98.4 F (36.9 C)  TempSrc: Oral  Oral Oral Oral  SpO2: 94% 96% 100% 99%  Weight:  70 kg      Intake/Output Summary (Last 24 hours) at 04/02/2018 1740 Last data filed at 04/02/2018 0915 Gross per 24 hour  Intake 934.67 ml  Output 400 ml  Net 534.67 ml   Filed Weights   04/02/18 0535  Weight: 70 kg    Examination:  General exam: Appears calm and comfortable  Respiratory system: Clear to auscultation. Respiratory effort normal. Cardiovascular system: S1 & S2 heard, RRR. No JVD, murmurs,  No pedal edema. Gastrointestinal system: Abdomen is nondistended, soft and nontender. Normal bowel sounds heard. Central nervous system: Alert and oriented. No focal neurological deficits. Extremities: Symmetric 5 x 5 power. Skin: No rashes, lesions or ulcers Psychiatry:  Mood & affect appropriate.     Data Reviewed: I have personally reviewed following labs and imaging studies  CBC: Recent Labs  Lab 04/01/18 1140  WBC 3.5*  NEUTROABS 2.7  HGB 10.5*  HCT 29.4*  MCV 96.7  PLT 630*   Basic Metabolic Panel: Recent Labs  Lab 04/01/18 1140 04/02/18 0555  NA 119* 121*  K 4.0 3.7  CL 86* 93*  CO2 25 21*  GLUCOSE 164* 98  BUN 27* 24*  CREATININE 1.50* 1.48*  CALCIUM 8.8* 8.0*   GFR: Estimated Creatinine Clearance: 23.5 mL/min (A) (by C-G formula based on SCr of 1.48 mg/dL (H)).  Liver Function Tests: No results for input(s): AST, ALT, ALKPHOS, BILITOT, PROT, ALBUMIN in the last 168 hours. No results for input(s): LIPASE, AMYLASE in the last 168 hours. No results for input(s): AMMONIA in the last 168 hours. Coagulation Profile: No results for input(s): INR, PROTIME in the last 168 hours. Cardiac Enzymes: No results for input(s): CKTOTAL, CKMB, CKMBINDEX, TROPONINI in the last 168 hours. BNP (last 3 results) No results for input(s): PROBNP in the last 8760 hours. HbA1C: No results for input(s): HGBA1C in the last 72 hours. CBG: No results for input(s): GLUCAP in the last 168 hours. Lipid Profile: No  results for input(s): CHOL, HDL, LDLCALC, TRIG, CHOLHDL, LDLDIRECT in the last 72 hours. Thyroid Function Tests: Recent Labs    04/01/18 1658  TSH 4.430   Anemia Panel: No results for input(s): VITAMINB12, FOLATE, FERRITIN, TIBC, IRON, RETICCTPCT in the last 72 hours. Sepsis Labs: No results for input(s): PROCALCITON, LATICACIDVEN in the last 168 hours.  Recent Results (from the past 240 hour(s))  Urine culture     Status: Abnormal   Collection Time: 04/01/18  3:02 PM  Result Value Ref Range Status   Specimen Description URINE, CLEAN CATCH  Final   Special Requests NONE  Final   Culture (A)  Final    <10,000 COLONIES/mL INSIGNIFICANT GROWTH Performed at [facility], [address] [place], [place] [address]    Report Status 04/02/2018 FINAL  Final         Radiology Studies: Dg Chest 2 View  Result Date: 04/01/2018 CLINICAL DATA:  Altered mental status, and weakness, history of a fall last Friday. The patient has completed antibiotics for recent urinary tract infection. History of metastatic breast malignancy, COPD, former smoker. EXAM: CHEST - 2 VIEW COMPARISON:  PA and lateral chest x-ray of September 11, 2016 FINDINGS: The lungs are adequately inflated. There is no focal infiltrate. There is no pleural effusion. The heart is top-normal in size. The pulmonary vascularity is normal. There is calcification in the wall of the aortic arch. The bony thorax exhibits no acute abnormality. There are degenerative changes of the left shoulder. There is chronic mild compression of a lower thoracic vertebral body. IMPRESSION: There is no pneumonia nor other acute cardiopulmonary abnormality. Thoracic aortic atherosclerosis. Electronically Signed   By: David  Martinique M.D.   On: 04/01/2018 11:57   Ct Head Wo Contrast  Result Date: 04/01/2018 CLINICAL DATA:  Worsening confusion beginning yesterday. History of metastatic breast cancer. EXAM: CT HEAD WITHOUT CONTRAST TECHNIQUE: Contiguous  axial images were obtained from the base of the skull through the vertex without intravenous contrast. COMPARISON:  06/12/2017 FINDINGS: Brain: There is no evidence of acute infarct, intracranial hemorrhage, mass, midline shift, or extra-axial fluid collection. Mild cerebral atrophy is unchanged. Cerebral white matter hypodensities are nonspecific but compatible with chronic small vessel ischemic disease, minimal for age. Vascular: Calcified atherosclerosis at the skull base. No hyperdense vessel. Skull: No fracture or focal osseous lesion. Sinuses/Orbits: Partially visualized small right maxillary sinus mucous retention cyst. Clear mastoid air cells. Bilateral cataract extraction. Other: None. IMPRESSION: No evidence of acute intracranial abnormality. Electronically Signed   By: Logan Bores M.D.   On: 04/01/2018 13:52        Scheduled Meds: . aspirin  81 mg Oral Q M,W,F,Su-1800  . gabapentin  100 mg Oral QHS  . heparin  5,000 Units Subcutaneous Q8H  . levothyroxine  25 mcg Oral Q0600  . losartan  25 mg Oral Daily  .  pantoprazole  40 mg Oral Daily  . propranolol ER  60 mg Oral Daily   Continuous Infusions: . sodium chloride 50 mL/hr at 04/02/18 1606     LOS: 1 day    Time spent: 35 minutes.     Hosie Poisson, MD [facility] Pager [phone]  If 7PM-7AM, please contact night-coverage [email or web address] Password [email or web address] 04/02/2018, 5:40 PM

## 2018-04-03 DIAGNOSIS — N183 Chronic kidney disease, stage 3 unspecified: Secondary | ICD-10-CM

## 2018-04-03 DIAGNOSIS — I10 Essential (primary) hypertension: Secondary | ICD-10-CM

## 2018-04-03 DIAGNOSIS — E871 Hypo-osmolality and hyponatremia: Principal | ICD-10-CM

## 2018-04-03 LAB — BASIC METABOLIC PANEL
ANION GAP: 4 — AB (ref 5–15)
ANION GAP: 7 (ref 5–15)
ANION GAP: 7 (ref 5–15)
BUN: 19 mg/dL (ref 8–23)
BUN: 20 mg/dL (ref 8–23)
BUN: 20 mg/dL (ref 8–23)
CALCIUM: 8.4 mg/dL — AB (ref 8.9–10.3)
CHLORIDE: 97 mmol/L — AB (ref 98–111)
CHLORIDE: 98 mmol/L (ref 98–111)
CO2: 25 mmol/L (ref 22–32)
CO2: 23 mmol/L (ref 22–32)
CO2: 22 mmol/L (ref 22–32)
Calcium: 8.3 mg/dL — ABNORMAL LOW (ref 8.9–10.3)
Calcium: 8.2 mg/dL — ABNORMAL LOW (ref 8.9–10.3)
Chloride: 100 mmol/L (ref 98–111)
Creatinine, Ser: 1.35 mg/dL — ABNORMAL HIGH (ref 0.44–1.00)
Creatinine, Ser: 1.33 mg/dL — ABNORMAL HIGH (ref 0.44–1.00)
Creatinine, Ser: 1.27 mg/dL — ABNORMAL HIGH (ref 0.44–1.00)
GFR calc Af Amer: 41 mL/min — ABNORMAL LOW (ref >60)
GFR calc non Af Amer: 33 mL/min — ABNORMAL LOW (ref >60)
GFR calc non Af Amer: 34 mL/min — ABNORMAL LOW (ref >60)
GFR, EST AFRICAN AMERICAN: 38 mL/min — AB (ref >60)
GFR, EST AFRICAN AMERICAN: 39 mL/min — AB (ref >60)
GFR, EST NON AFRICAN AMERICAN: 36 mL/min — AB (ref >60)
Glucose, Bld: 158 mg/dL — ABNORMAL HIGH (ref 70–99)
Glucose, Bld: 102 mg/dL — ABNORMAL HIGH (ref 70–99)
Glucose, Bld: 135 mg/dL — ABNORMAL HIGH (ref 70–99)
POTASSIUM: 4.4 mmol/L (ref 3.5–5.1)
Potassium: 4.6 mmol/L (ref 3.5–5.1)
Potassium: 4.7 mmol/L (ref 3.5–5.1)
Sodium: 126 mmol/L — ABNORMAL LOW (ref 135–145)
Sodium: 128 mmol/L — ABNORMAL LOW (ref 135–145)
Sodium: 129 mmol/L — ABNORMAL LOW (ref 135–145)

## 2018-04-03 LAB — SODIUM, URINE, RANDOM: SODIUM UR: 56 mmol/L

## 2018-04-03 LAB — OSMOLALITY, URINE: Osmolality, Ur: 321 mOsm/kg (ref 300–900)

## 2018-04-03 NOTE — Progress Notes (Signed)
PROGRESS NOTE    Theresa Wallace  LKG:[id number] DOB: 12-01-1923 DOA: 04/01/2018 PCP: [name], MD   Brief Narrative: Theresa Wallace is a 84 y.o. female with a history of metastatic breast cancer and hypertension. She presented secondary to confusion and found to have hyponatremia.   Assessment & Plan:   Active Problems:   Hyponatremia   CKD (chronic kidney disease), stage III (HCC)   Essential hypertension   Hyponatremia In setting of hydrochlorothiazide and likely volume depletion. Improving with holding hydrochlorothiazide and IV fluids -Continue IV fluids -BMP q12 hours  Confusion Secondary to hyponatremia. Improved.  Essential hypertension On hydrochlorothiazide and losartan as an outpatient -Continue Propranolol, Losartan and discontinue hydrochlorothiazide  Breast cancer Follows Dr. Jana Hakim as an outpatient. Currently on fulvestrant, Ibrance and Xgeva per last oncology note  CKD stage III Stable.  Anemia of chronic disease Stable.  Pressure Injury Documentation:     DVT prophylaxis: Heparin Code Status:   Code Status: DNR Family Communication: Two daughters at bedside Disposition Plan: Discharge likely in 24 hours if sodium improves further   Consultants:   None  Procedures:   None  Antimicrobials:  None    Subjective: No issues overnight. Feeling more like herself.  Objective: Vitals:   04/02/18 2037 04/03/18 0537 04/03/18 0908 04/03/18 1749  BP: (!) 114/55 (!) 173/61 (!) 133/50 (!) 140/56  Pulse: 70 66 66 62  Resp: 18 18 20 20   Temp: 98.2 F (36.8 C) 97.9 F (36.6 C) 97.8 F (36.6 C) 98.6 F (37 C)  TempSrc: Oral Oral Oral Oral  SpO2: 97% 98% 100% 95%  Weight:        Intake/Output Summary (Last 24 hours) at 04/03/2018 1817 Last data filed at 04/03/2018 1300 Gross per 24 hour  Intake 1548.21 ml  Output 900 ml  Net 648.21 ml   Filed Weights   04/02/18 0535  Weight: 70 kg    Examination:  General exam: Appears  calm and comfortable Respiratory system: Clear to auscultation. Respiratory effort normal. Cardiovascular system: S1 & S2 heard, RRR. No murmurs, rubs, gallops or clicks. Gastrointestinal system: Abdomen is nondistended, soft and nontender. No organomegaly or masses felt. Normal bowel sounds heard. Central nervous system: Alert and oriented. No focal neurological deficits. Extremities: No edema. No calf tenderness Skin: No cyanosis. No rashes Psychiatry: Judgement and insight appear normal. Mood & affect appropriate.     Data Reviewed: I have personally reviewed following labs and imaging studies  CBC: Recent Labs  Lab 04/01/18 1140  WBC 3.5*  NEUTROABS 2.7  HGB 10.5*  HCT 29.4*  MCV 96.7  PLT 664*   Basic Metabolic Panel: Recent Labs  Lab 04/01/18 1140 04/02/18 0555 04/03/18 0830 04/03/18 1210  NA 119* 121* 126* 128*  K 4.0 3.7 4.6 4.7  CL 86* 93* 97* 98  CO2 25 21* 25 23  GLUCOSE 164* 98 158* 102*  BUN 27* 24* 19 20  CREATININE 1.50* 1.48* 1.35* 1.33*  CALCIUM 8.8* 8.0* 8.3* 8.4*   GFR: Estimated Creatinine Clearance: 26.2 mL/min (A) (by C-G formula based on SCr of 1.33 mg/dL (H)). Liver Function Tests: No results for input(s): AST, ALT, ALKPHOS, BILITOT, PROT, ALBUMIN in the last 168 hours. No results for input(s): LIPASE, AMYLASE in the last 168 hours. No results for input(s): AMMONIA in the last 168 hours. Coagulation Profile: No results for input(s): INR, PROTIME in the last 168 hours. Cardiac Enzymes: No results for input(s): CKTOTAL, CKMB, CKMBINDEX, TROPONINI in the last 168 hours. BNP (last  3 results) No results for input(s): PROBNP in the last 8760 hours. HbA1C: No results for input(s): HGBA1C in the last 72 hours. CBG: No results for input(s): GLUCAP in the last 168 hours. Lipid Profile: No results for input(s): CHOL, HDL, LDLCALC, TRIG, CHOLHDL, LDLDIRECT in the last 72 hours. Thyroid Function Tests: Recent Labs    04/01/18 1658  TSH 4.430    Anemia Panel: No results for input(s): VITAMINB12, FOLATE, FERRITIN, TIBC, IRON, RETICCTPCT in the last 72 hours. Sepsis Labs: No results for input(s): PROCALCITON, LATICACIDVEN in the last 168 hours.  Recent Results (from the past 240 hour(s))  Urine culture     Status: Abnormal   Collection Time: 04/01/18  3:02 PM  Result Value Ref Range Status   Specimen Description URINE, CLEAN CATCH  Final   Special Requests NONE  Final   Culture (A)  Final    <10,000 COLONIES/mL INSIGNIFICANT GROWTH Performed at [facility], [address], [place], [place] [address]    Report Status 04/02/2018 FINAL  Final         Radiology Studies: No results found.      Scheduled Meds: . aspirin  81 mg Oral Q M,W,F,Su-1800  . gabapentin  100 mg Oral QHS  . heparin  5,000 Units Subcutaneous Q8H  . levothyroxine  25 mcg Oral Q0600  . losartan  25 mg Oral Daily  . pantoprazole  40 mg Oral Daily  . propranolol ER  60 mg Oral Daily   Continuous Infusions: . sodium chloride 50 mL/hr at 04/03/18 1236     LOS: 2 days     Cordelia Poche, MD [facility] 04/03/2018, 6:17 PM  If 7PM-7AM, please contact night-coverage [email or web address]

## 2018-04-03 NOTE — Progress Notes (Signed)
Physical Therapy Treatment Patient Details Name: Theresa Wallace MRN: [id number] DOB: 12-04-1923 Today's Date: 04/03/2018    History of Present Illness Pt is a 84 y/o female who presents with impaired cognition. She was found to have hyponatremia. PMH significant for hypertension, cataract surgeries and treatment for a UTI.     PT Comments    Pt progressing towards physical therapy goals. Had an extensive discussion with pt and family regarding  HHPT vs. SNF for continued therapy services. It appears that pt is near functional baseline, however there may be some caregiver exhaustion starting, as daughter mentioned hiring a caregiver to help the pt for a portion of the day to giver her a break. The family inquired about CIR, however I do not feel the pt is appropriate for this setting as she is able to complete transfers and ambulation with min guard assist to supervision for safety with her RW. The patient wishes to return home at d/c, and is agreeable to [facility]. I don't know if one setting was definitively chosen over another by the end of the session/discussion, however it seems that the family was leaning towards home with HHPT as well. Will continue to follow and plan for return home at d/c.   Follow Up Recommendations  Supervision for mobility/OOB;Home health PT     Equipment Recommendations  3in1 (PT)    Recommendations for Other Services       Precautions / Restrictions Precautions Precautions: None Restrictions Weight Bearing Restrictions: No    Mobility  Bed Mobility Overal bed mobility: Needs Assistance Bed Mobility: Supine to Sit;Sit to Supine     Supine to sit: Supervision Sit to supine: Supervision   General bed mobility comments: Pt was able to transition to/from EOB without assistance.   Transfers Overall transfer level: Needs assistance Equipment used: Rolling walker (2 wheeled) Transfers: Sit to/from Stand Sit to Stand: Min guard         General transfer  comment: VC's for hand placement on seated surface for safety. No assist required to power-up to full stand however hands-on guarding provided for safety.   Ambulation/Gait Ambulation/Gait assistance: Min guard Gait Distance (Feet): 100 Feet Assistive device: Rolling walker (2 wheeled) Gait Pattern/deviations: Step-through pattern;Decreased stride length;Trunk flexed Gait velocity: Decreased Gait velocity interpretation: 1.31 - 2.62 ft/sec, indicative of limited community ambulator General Gait Details: Slow but generally steady with RW for support   Stairs Stairs: Yes Stairs assistance: Min guard Stair Management: One rail Right;One rail Left;Step to pattern;Forwards;Sideways Number of Stairs: 6(3x3) General stair comments: VC's for sequencing and safety. Pt insistent that rail is on the R at home, and daughter is insistent that rail is on the L. So we practiced both rails with sideways negotiation and B hands on rail for support.    Wheelchair Mobility    Modified Rankin (Stroke Patients Only)       Balance Overall balance assessment: Mild deficits observed, not formally tested                                          Cognition Arousal/Alertness: Awake/alert Behavior During Therapy: WFL for tasks assessed/performed Overall Cognitive Status: History of cognitive impairments - at baseline  Exercises Other Exercises Other Exercises: Medbridge Access Code: [email or web address] Other Exercises: Printout given to pt/family but did not get to review the whole thing as MD entered.     General Comments        Pertinent Vitals/Pain Pain Assessment: Faces Faces Pain Scale: No hurt Pain Intervention(s): Monitored during session    Home Living                      Prior Function            PT Goals (current goals can now be found in the care plan section) Acute Rehab PT Goals Patient Stated Goal:  Would like to return home, amb around house with walker and ascend/descend stairs with family members min assist one week from today  PT Goal Formulation: With patient/family Time For Goal Achievement: 04/09/18 Potential to Achieve Goals: Good Progress towards PT goals: Progressing toward goals    Frequency    Min 3X/week      PT Plan Current plan remains appropriate    Co-evaluation              AM-PAC PT "6 Clicks" Daily Activity  Outcome Measure  Difficulty turning over in bed (including adjusting bedclothes, sheets and blankets)?: None Difficulty moving from lying on back to sitting on the side of the bed? : A Little Difficulty sitting down on and standing up from a chair with arms (e.g., wheelchair, bedside commode, etc,.)?: A Little Help needed moving to and from a bed to chair (including a wheelchair)?: A Little Help needed walking in hospital room?: A Little Help needed climbing 3-5 steps with a railing? : A Little 6 Click Score: 19    End of Session Equipment Utilized During Treatment: Gait belt Activity Tolerance: Patient tolerated treatment well Patient left: in bed;with family/visitor present Nurse Communication: Mobility status PT Visit Diagnosis: Muscle weakness (generalized) (M62.81);Difficulty in walking, not elsewhere classified (R26.2)     Time: 1136-1209 PT Time Calculation (min) (ACUTE ONLY): 33 min  Charges:  $Gait Training: 23-37 mins                     Theresa Wallace, PT, DPT Acute Rehabilitation Services Pager: [phone] Office: [phone]    Theresa Wallace 04/03/2018, 1:47 PM

## 2018-04-04 LAB — BASIC METABOLIC PANEL
Anion gap: 7 (ref 5–15)
BUN: 17 mg/dL (ref 8–23)
CALCIUM: 8.1 mg/dL — AB (ref 8.9–10.3)
CO2: 22 mmol/L (ref 22–32)
CREATININE: 1.19 mg/dL — AB (ref 0.44–1.00)
Chloride: 102 mmol/L (ref 98–111)
GFR calc Af Amer: 45 mL/min — ABNORMAL LOW (ref >60)
GFR calc non Af Amer: 38 mL/min — ABNORMAL LOW (ref >60)
GLUCOSE: 101 mg/dL — AB (ref 70–99)
Potassium: 4.4 mmol/L (ref 3.5–5.1)
Sodium: 131 mmol/L — ABNORMAL LOW (ref 135–145)

## 2018-04-04 NOTE — Care Management Important Message (Signed)
Important Message  Patient Details  Name: Theresa Wallace MRN: [id number] Date of Birth: May 15, 1924   Medicare Important Message Given:  Yes    Annica Marinello [address] 04/04/2018, 3:19 PM

## 2018-04-04 NOTE — Progress Notes (Signed)
Occupational Therapy Note  OT eval completed, full note to follow.  Suggestions made for home modifications/adaptations.   Daughter reports she is comfortable with Home discharge.  Recommend HHOT, 3in1 for home use is in the room.  Pt is eager for discharge.   Lucille Passy, OTR/L Runner, broadcasting/film/video [phone] Office [phone]

## 2018-04-04 NOTE — Discharge Summary (Signed)
Physician Discharge Summary  Theresa Wallace ZHY:[id number] DOB: April 26, 1924 DOA: 04/01/2018  PCP: [name], MD  Admit date: 04/01/2018 Discharge date: 04/04/2018  Admitted From: Home Disposition: Home  Recommendations for Outpatient Follow-up:  1. Follow up with PCP in 1 week 2. Please obtain BMP in one week 3. Please follow up on the following pending results: None  Home Health: PT, OT Equipment/Devices: 3 in 1  Discharge Condition: Stable CODE STATUS: DNR Diet recommendation: Heart healthy   Brief/Interim Summary:  Admission HPI written by Merton Border, MD   HPI  Theresa Wallace  is a 84 y.o. female, past medical history significant for metastatic breast cancer, and hypertension who was recently treated for UTI.  She was taken extra water last week and for the last 3 days she has become more confused and weak.  Her sodium in the emergency room was 119.  Patient is on hydrochlorothiazide as well.  Patient was seen and examined and she denies any chest pains or shortness of breath , she just feels spaced out.    Hospital course:  Hyponatremia In setting of hydrochlorothiazide and likely volume depletion. Improving with holding hydrochlorothiazide and IV fluids. Sodium up to 131 prior to discharge. Recheck as an outpatient.  Confusion Secondary to hyponatremia. Improved.  Essential hypertension On hydrochlorothiazide and losartan as an outpatient. Continue Propranolol, Losartan and discontinue hydrochlorothiazide  Breast cancer Follows Dr. Jana Hakim as an outpatient. Currently on fulvestrant, Ibrance and Xgeva per last oncology note  CKD stage III Stable.  Anemia of chronic disease Stable.  Discharge Diagnoses:  Active Problems:   Hyponatremia   CKD (chronic kidney disease), stage III [facility])   Essential hypertension    Discharge Instructions  Discharge Instructions    Call MD for:  difficulty breathing, headache or visual disturbances   Complete  by:  As directed    Call MD for:  persistant dizziness or light-headedness   Complete by:  As directed    Call MD for:  persistant nausea and vomiting   Complete by:  As directed    Increase activity slowly   Complete by:  As directed      Allergies as of 04/04/2018      Reactions   Cantaloupe (diagnostic)    Ciprofloxacin    Epinephrine    Pineapple    Tamoxifen    Mupirocin Rash      Medication List    STOP taking these medications   cefUROXime 250 MG tablet Commonly known as:  CEFTIN   hydrochlorothiazide 12.5 MG tablet Commonly known as:  HYDRODIURIL     TAKE these medications   aspirin 81 MG tablet Take 81 mg by mouth every Monday,Wednesday,Friday, and Sunday at 6 PM.   calcium-vitamin D 500-200 MG-UNIT tablet Take 1 tablet by mouth 2 (two) times daily with a meal.   cholecalciferol 10 MCG (400 UNIT) Tabs tablet Commonly known as:  VITAMIN D3 Take 2,000 Units by mouth daily.   denosumab 60 MG/ML Sosy injection Commonly known as:  PROLIA Inject into the skin.   esomeprazole 20 MG capsule Commonly known as:  NEXIUM Take 1 capsule (20 mg total) by mouth as needed.   fluconazole 150 MG tablet Commonly known as:  DIFLUCAN as needed.   gabapentin 100 MG capsule Commonly known as:  NEURONTIN Take 1 tab at bedtime and may increase to bid if needed   IBRANCE 100 MG capsule Generic drug:  palbociclib TAKE 1 CAPSULE (100 MG TOTAL) BY MOUTH DAILY WITH  BREAKFAST. TAKE FOR 21 DAYS ON, 7 DAYS OFF, REPEAT EVERY 28 DAYS. TAKE WHOLE WITH FOOD.   ketoconazole 2 % cream Commonly known as:  NIZORAL Apply 1 application topically daily as needed for irritation.   levothyroxine 25 MCG tablet Commonly known as:  SYNTHROID, LEVOTHROID Take 25 mcg by mouth daily.   losartan 50 MG tablet Commonly known as:  COZAAR 25 mg.   metroNIDAZOLE 0.75 % gel Commonly known as:  METROGEL Apply topically 2 (two) times daily. For rosacea   multivitamin tablet Take 1 tablet by  mouth daily.   naproxen sodium 220 MG tablet Commonly known as:  ALEVE Take 220 mg by mouth as needed (knee pain).   propranolol ER 60 MG 24 hr capsule Commonly known as:  INDERAL LA   ZADITOR 0.025 % ophthalmic solution Generic drug:  ketotifen Place 1 drop into both eyes as needed (itchy eyes).            Durable Medical Equipment  (From admission, onward)         Start     Ordered   04/03/18 1817  For home use only DME 3 n 1  Once     04/03/18 1816         Follow-up [place] Follow up.   Why:  3/1 to be delivered to room prior to DC Contact information: [address] [place] [address] [phone]        [facility] Follow [facility].   Specialty:  Home Health Services Why:  For home health services. They will be in contact with you in the next 1-2 days to set up first home appointment.  Contact information: [place] [address] [phone]        [name], MD. Schedule an appointment as soon as possible for a visit in 1 week(s).   Specialty:  Internal Medicine Why:  Repeat lab work Contact information: [place] [place] [address] [phone]          Allergies  Allergen Reactions  . Cantaloupe (Diagnostic)   . Ciprofloxacin   . Epinephrine   . Pineapple   . Tamoxifen   . Mupirocin Rash    Consultations:  None   Procedures/Studies: Dg Chest 2 View  Result Date: 04/01/2018 CLINICAL DATA:  Altered mental status, and weakness, history of a fall last Friday. The patient has completed antibiotics for recent urinary tract infection. History of metastatic breast malignancy, COPD, former smoker. EXAM: CHEST - 2 VIEW COMPARISON:  PA and lateral chest x-ray of September 11, 2016 FINDINGS: The lungs are adequately inflated. There is no focal infiltrate. There is no pleural effusion. The heart is top-normal in size. The pulmonary  vascularity is normal. There is calcification in the wall of the aortic arch. The bony thorax exhibits no acute abnormality. There are degenerative changes of the left shoulder. There is chronic mild compression of a lower thoracic vertebral body. IMPRESSION: There is no pneumonia nor other acute cardiopulmonary abnormality. Thoracic aortic atherosclerosis. Electronically Signed   By: David  Martinique M.D.   On: 04/01/2018 11:57   Ct Head Wo Contrast  Result Date: 04/01/2018 CLINICAL DATA:  Worsening confusion beginning yesterday. History of metastatic breast cancer. EXAM: CT HEAD WITHOUT CONTRAST TECHNIQUE: Contiguous axial images were obtained from the base of the skull through the vertex without intravenous contrast. COMPARISON:  06/12/2017 FINDINGS: Brain: There is no  evidence of acute infarct, intracranial hemorrhage, mass, midline shift, or extra-axial fluid collection. Mild cerebral atrophy is unchanged. Cerebral white matter hypodensities are nonspecific but compatible with chronic small vessel ischemic disease, minimal for age. Vascular: Calcified atherosclerosis at the skull base. No hyperdense vessel. Skull: No fracture or focal osseous lesion. Sinuses/Orbits: Partially visualized small right maxillary sinus mucous retention cyst. Clear mastoid air cells. Bilateral cataract extraction. Other: None. IMPRESSION: No evidence of acute intracranial abnormality. Electronically Signed   By: Logan Bores M.D.   On: 04/01/2018 13:52      Subjective: No issues overnight  Discharge Exam: Vitals:   04/04/18 0525 04/04/18 0922  BP: (!) 149/74 (!) 114/52  Pulse: 60 62  Resp: 18 18  Temp: 98.2 F (36.8 C) 97.8 F (36.6 C)  SpO2: 98% 99%   Vitals:   04/03/18 1749 04/03/18 2100 04/04/18 0525 04/04/18 0922  BP: (!) 140/56 (!) 149/49 (!) 149/74 (!) 114/52  Pulse: 62 65 60 62  Resp: 20 18 18 18   Temp: 98.6 F (37 C) 98.2 F (36.8 C) 98.2 F (36.8 C) 97.8 F (36.6 C)  TempSrc: Oral Oral Oral  Oral  SpO2: 95% 99% 98% 99%  Weight:        General: Pt is alert, awake, not in acute distress Extremities: no edema, no cyanosis    The results of significant diagnostics from this hospitalization (including imaging, microbiology, ancillary and laboratory) are listed below for reference.     Microbiology: Recent Results (from the past 240 hour(s))  Urine culture     Status: Abnormal   Collection Time: 04/01/18  3:02 PM  Result Value Ref Range Status   Specimen Description URINE, CLEAN CATCH  Final   Special Requests NONE  Final   Culture (A)  Final    <10,000 COLONIES/mL INSIGNIFICANT GROWTH Performed at [facility], [address] [place], [place] [address]    Report Status 04/02/2018 FINAL  Final     Labs: BNP (last 3 results) No results for input(s): BNP in the last 8760 hours. Basic Metabolic Panel: Recent Labs  Lab 04/02/18 0555 04/03/18 0830 04/03/18 1210 04/03/18 1911 04/04/18 0637  NA 121* 126* 128* 129* 131*  K 3.7 4.6 4.7 4.4 4.4  CL 93* 97* 98 100 102  CO2 21* 25 23 22 22   GLUCOSE 98 158* 102* 135* 101*  BUN 24* 19 20 20 17   CREATININE 1.48* 1.35* 1.33* 1.27* 1.19*  CALCIUM 8.0* 8.3* 8.4* 8.2* 8.1*   Liver Function Tests: No results for input(s): AST, ALT, ALKPHOS, BILITOT, PROT, ALBUMIN in the last 168 hours. No results for input(s): LIPASE, AMYLASE in the last 168 hours. No results for input(s): AMMONIA in the last 168 hours. CBC: Recent Labs  Lab 04/01/18 1140  WBC 3.5*  NEUTROABS 2.7  HGB 10.5*  HCT 29.4*  MCV 96.7  PLT 139*   Cardiac Enzymes: No results for input(s): CKTOTAL, CKMB, CKMBINDEX, TROPONINI in the last 168 hours. BNP: Invalid input(s): POCBNP CBG: No results for input(s): GLUCAP in the last 168 hours. D-Dimer No results for input(s): DDIMER in the last 72 hours. Hgb A1c No results for input(s): HGBA1C in the last 72 hours. Lipid Profile No results for input(s): CHOL, HDL, LDLCALC, TRIG, CHOLHDL, LDLDIRECT in  the last 72 hours. Thyroid function studies Recent Labs    04/01/18 1658  TSH 4.430   Anemia work up No results for input(s): VITAMINB12, FOLATE, FERRITIN, TIBC, IRON, RETICCTPCT in the last 72 hours.  Urinalysis    Component Value Date/Time   COLORURINE YELLOW 04/01/2018 1502   APPEARANCEUR CLEAR 04/01/2018 1502   LABSPEC 1.009 04/01/2018 1502   PHURINE 5.0 04/01/2018 1502   GLUCOSEU NEGATIVE 04/01/2018 1502   HGBUR NEGATIVE 04/01/2018 1502   BILIRUBINUR NEGATIVE 04/01/2018 1502   KETONESUR NEGATIVE 04/01/2018 1502   PROTEINUR NEGATIVE 04/01/2018 1502   NITRITE NEGATIVE 04/01/2018 1502   LEUKOCYTESUR NEGATIVE 04/01/2018 1502   Sepsis Labs Invalid input(s): PROCALCITONIN,  WBC,  LACTICIDVEN Microbiology Recent Results (from the past 240 hour(s))  Urine culture     Status: Abnormal   Collection Time: 04/01/18  3:02 PM  Result Value Ref Range Status   Specimen Description URINE, CLEAN CATCH  Final   Special Requests NONE  Final   Culture (A)  Final    <10,000 COLONIES/mL INSIGNIFICANT GROWTH Performed at [facility], [place] [address] [place], [place] [address]    Report Status 04/02/2018 FINAL  Final     SIGNED:   Cordelia Poche, MD [facility] 04/04/2018, 1:10 PM

## 2018-04-04 NOTE — Discharge Instructions (Signed)
Theresa Wallace,  You were admitted because of low sodium (hyponatremia). This appears to be secondary to hydrochlorothiazide. You were treated with IV fluids and discontinuation of hydrochlorothiazide. Please follow-up with your primary care physician next week for repeat lab work.

## 2018-04-04 NOTE — Care Management Note (Addendum)
Case Management Note  Patient Details  Name: Theresa Wallace MRN: [id number] Date of Birth: 1924/01/22  Subjective/Objective:                    Action/Plan:  Spoke with patient and daughter at bedside. They would like to use [facility] for [facility] services. Referral placed to [facility]. They also need 3/1. Referral placed to [facility] for delivery of DME to room today prior to DC.   [place] not able to service patient. Spoke w daughter at bedside. They would like to try [facility], referral made and accepted by Butch Penny, updated Tiffany with [facility] that [facility] would now follow.    Expected Discharge Date:                  Expected Discharge Plan:  [place]  In-House Referral:     Discharge planning Services  CM Consult  Post Acute Care Choice:  Home Health, Durable Medical Equipment Choice offered to:  Patient, Adult Children  DME Arranged:  3-N-1 DME Agency:  [place]:  PT, OT [place] Agency:  [place]  Status of Service:  In process, will continue to follow  If discussed at Long Length of Stay Meetings, dates discussed:    Additional Comments:  Carles Collet, RN 04/04/2018, 10:03 AM

## 2018-04-04 NOTE — Progress Notes (Signed)
COURTESY NOTE:  Aware of patient's admission for confusion felt secondary to dehydration and hyponatremia; these are being addressed. -- The likely culprit is diuretics, though SIADH is rarely associated with the patient's cancer.  I am appending a summary of her cancer status-- generally she has been very stable and she is scheduled for treatment and visit here 04/08/2018  Please let me know if I can be of help.  Virgie Dad Yahir Tavano MD   84 y.o. [place] woman  (1) Status post left lumpectomy in 1996 for a T1b N0 grade 2 invasive ductal carcinoma which was strongly estrogen and progesterone receptor positive, treated with radiation, then anastrozole, then Evista, all treatment discontinued July 2012  METASTATIC DISEASE: July 2012 (2) recurrence to the lining of the left lung and liver documented by liver biopsy July 2012, showing metastatic adenocarcinoma estrogen and progesterone receptor positive at 96% and 97% respectively; there was no Her-2 amplification.   (3)  She has been on exemestane since early July of 2012, with chest CT scan April 2013 showing complete resolution of her lung and liver metastases             (a) bone density in June of 08/09/2011 was normal             (b) bone density 03/20/2016 was normal with a T score of -0.6             (c) exemestane continued at the time of disease progression noted May 2018             (d) exemestane discontinued April 2019  (4) MRI of the lumbar spine 09-2012 showed significant degenerative disease but no evidence of metastatic disease to bone.  (5) disease progression noted May 2018: CT scan shows liver, lung, and bone lesions  (6) fulvestrant started 09/26/2016, palbociclib added 10/24/2016 at 125 mg daily, 21 days on. 7 days off             (a) palbociclib dose decreased to 149m daily on 02/14/2017  (7) denosumab/Xgeva started 11/21/2016, repeated every 28 days until 11/19/2017, then every 8 weeks thereafter

## 2018-04-04 NOTE — Progress Notes (Signed)
Patient discharged to home with daughter, home health set up, AVS reviewed; all questions answered and patient is able to verbalize understanding, IV removed and tele-box returned

## 2018-04-05 ENCOUNTER — Telehealth: Payer: Self-pay

## 2018-04-05 LAB — CANCER ANTIGEN 27.29: CAN 27.29: 128 U/mL — AB (ref 0.0–38.6)

## 2018-04-05 NOTE — Progress Notes (Signed)
Occupational Therapy Evaluation (late entry)  Patient evaluated by Occupational Therapy with no further acute OT needs identified. All education has been completed and the patient has no further questions. Recommend HHOT See below for any follow-up Occupational Therapy or equipment needs. OT is signing off. Thank you for this referral.    04/04/18 1419  OT Visit Information  Last OT Received On 04/05/18  Assistance Needed +1  History of Present Illness Pt is a 84 y/o female who presents with impaired cognition. She was found to have hyponatremia. PMH significant for hypertension, cataract surgeries and treatment for a UTI.   Precautions  Precautions Fall  Home Living  Family/patient expects to be discharged to: Private residence  Living Arrangements Children  Available Help at Discharge Family  Type of [place] to enter  Entrance Stairs-Number of Steps 3  Entrance Stairs-Rails Right  Home Layout One level  Bathroom Shower/Tub Walk-in Patent examiner - 2 wheels;Toilet riser  Additional Comments rails in shower, no chair. uses counter to help her get up off toilet.  Prior Function  Level of Independence Independent with assistive device(s)  Gait / Transfers Assistance Needed walked to and from bathroom, around house with walker, used walker and daughter's assistance to ascend/descend stairs   Comments daughter reports pt was mod I with ADLs   Communication  Communication HOH  Pain Assessment  Pain Assessment No/denies pain  Cognition  Arousal/Alertness Awake/alert  Behavior During Therapy WFL for tasks assessed/performed  Overall Cognitive Status History of cognitive impairments - at baseline  Upper Extremity Assessment  LUE Deficits / Details WFL for functional actitivies   Lower Extremity Assessment  Lower Extremity Assessment Defer to PT evaluation  ADL  Overall ADL's  Needs assistance/impaired  Eating/Feeding  Independent  Grooming Wash/dry hands;Brushing hair;Min guard;Standing  Upper Body Bathing Supervision/ safety;Set up;Sitting  Lower Body Bathing Minimal assistance;Sit to/from stand  Upper Body Dressing  Set up;Supervision/safety;Sitting  Lower Body Dressing Minimal assistance;Sit to/from Environmental education officer guard;Ambulation;Comfort height toilet;BSC;Grab bars;RW  Actuary Min guard;Sit to/from stand  Tub/Shower Transfer Details (indicate cue type and reason) instructed pt and daughter how to use 3in1 in shower and how to back onto [facility].  They verbalized understanding.     Functional mobility during ADLs Min guard;Rolling walker  General ADL Comments assist for balance   Bed Mobility  Overal bed mobility Needs Assistance  Bed Mobility Supine to Sit  Supine to sit Supervision  Transfers  Overall transfer level Needs assistance  Equipment used Rolling walker (2 wheeled)  Transfers Sit to/from Stand;Stand Pivot Transfers  Sit to Stand Min guard  Balance  Overall balance assessment Mild deficits observed, not formally tested  General Comments  General comments (skin integrity, edema, etc.) Spoke with pt and daughter about acquiring a bed alarm, use of baby monitor, and using cow bell to alert daughter if pt needs her.  Also discussed talking with HHPT re: use of rollator for community access.     OT - End of Session  Equipment Utilized During Treatment Rolling walker  Activity Tolerance Patient tolerated treatment well  Patient left in bed;with call bell/phone within reach;with family/visitor present  Nurse Communication Mobility status  OT Assessment  OT Recommendation/Assessment All further OT needs can be met in the next venue of care  OT Visit Diagnosis Unsteadiness on feet (R26.81)  OT Problem List Decreased strength;Decreased activity tolerance;Impaired balance (sitting and/or standing);Decreased  cognition;Decreased safety  awareness;Decreased knowledge of use of DME or AE  AM-PAC OT "6 Clicks" Daily Activity Outcome Measure  Help from another person eating meals? 4  Help from another person taking care of personal grooming? 3  Help from another person toileting, which includes using toliet, bedpan, or urinal? 3  Help from another person bathing (including washing, rinsing, drying)? 3  Help from another person to put on and taking off regular upper body clothing? 3  Help from another person to put on and taking off regular lower body clothing? 3  6 Click Score 19  ADL G Code Conversion CK  OT Recommendation  Follow Up Recommendations Home health OT;Supervision/Assistance - 24 hour  OT Equipment None recommended by OT  Individuals Consulted  Consulted and Agree with Results and Recommendations Patient;Family member/caregiver  Family Member Consulted daughter   Acute Rehab OT Goals  Patient Stated Goal to go home this afternoon   OT Goal Formulation All assessment and education complete, DC therapy  OT Time Calculation  OT Start Time (ACUTE ONLY) 1342  OT Stop Time (ACUTE ONLY) 1415  OT Time Calculation (min) 33 min  OT General Charges  $OT Visit 1 Visit  OT Evaluation  $OT Eval Moderate Complexity 1 Mod  OT Treatments  $Self Care/Home Management  8-22 mins  Written Expression  Dominant Hand Right  Lucille Passy, OTR/L [place] Pager [phone] Office [phone]

## 2018-04-05 NOTE — Telephone Encounter (Signed)
Herbert Deaner, PT with [facility] requesting orders for additional PT for functional mobility and fall risk reduction.  Per Dr. Jana Hakim okay to continue to PT 2 times weekly X 3 weeks.  Herbert Deaner given verbal orders.  No further needs.

## 2018-04-08 ENCOUNTER — Inpatient Hospital Stay: Payer: Medicare Other | Attending: Adult Health

## 2018-04-08 ENCOUNTER — Inpatient Hospital Stay: Payer: Medicare Other

## 2018-04-08 DIAGNOSIS — C50919 Malignant neoplasm of unspecified site of unspecified female breast: Secondary | ICD-10-CM

## 2018-04-08 DIAGNOSIS — C50922 Malignant neoplasm of unspecified site of left male breast: Secondary | ICD-10-CM

## 2018-04-08 DIAGNOSIS — Z170 Estrogen receptor positive status [ER+]: Secondary | ICD-10-CM | POA: Insufficient documentation

## 2018-04-08 DIAGNOSIS — Z79818 Long term (current) use of other agents affecting estrogen receptors and estrogen levels: Secondary | ICD-10-CM | POA: Diagnosis not present

## 2018-04-08 DIAGNOSIS — C50812 Malignant neoplasm of overlapping sites of left female breast: Secondary | ICD-10-CM | POA: Insufficient documentation

## 2018-04-08 MED ORDER — FULVESTRANT 250 MG/5ML IM SOLN
500.0000 mg | Freq: Once | INTRAMUSCULAR | Status: AC
  Administered 2018-04-08: 500 mg via INTRAMUSCULAR

## 2018-04-08 MED ORDER — FULVESTRANT 250 MG/5ML IM SOLN
INTRAMUSCULAR | Status: AC
  Filled 2018-04-08: qty 5

## 2018-04-08 MED FILL — IBRANCE 100 MG CAPSULE: 100 | 28 days supply | Qty: 21 | Fill #3

## 2018-04-08 NOTE — Patient Instructions (Signed)

## 2018-04-23 ENCOUNTER — Ambulatory Visit (HOSPITAL_COMMUNITY)
Admission: RE | Admit: 2018-04-23 | Discharge: 2018-04-23 | Disposition: A | Discharge status: Home / Self Care | Payer: Medicare Other | Source: Ambulatory Visit | Attending: Oncology | Admitting: Oncology

## 2018-04-23 DIAGNOSIS — Z170 Estrogen receptor positive status [ER+]: Secondary | ICD-10-CM | POA: Insufficient documentation

## 2018-04-23 DIAGNOSIS — C50812 Malignant neoplasm of overlapping sites of left female breast: Secondary | ICD-10-CM

## 2018-04-23 DIAGNOSIS — C50919 Malignant neoplasm of unspecified site of unspecified female breast: Secondary | ICD-10-CM | POA: Insufficient documentation

## 2018-04-23 LAB — GLUCOSE, CAPILLARY: Glucose-Capillary: 99 mg/dL (ref 70–99)

## 2018-04-23 MED ORDER — FLUDEOXYGLUCOSE F - 18 (FDG) INJECTION
7.7100 | Freq: Once | INTRAVENOUS | Status: AC | PRN
  Administered 2018-04-23: 7.71 via INTRAVENOUS

## 2018-05-05 NOTE — Progress Notes (Signed)
ID: Theresa Wallace   DOB: 10-10-1923  MR#: [id number]  RWE#:[id number]   Patient Care Team: [name], MD as PCP - General (Internal Medicine) GYN: SU:  OTHER MD:  CHIEF COMPLAINT:  Metastatic Breast Cancer, estrogen receptor positive  CURRENT TREATMENT: fulvestrant, palbociclib, Delton See   INTERVAL HISTORY: [name] returns today for follow-up of her estrogen receptor positive metastatic breast cancer. She is accompanied by her daughter.  The patient continues on palbociclib, which she tolerates with no side effects that she is aware of.  She will begin her next cycle on 05/08/2018  The patient also continues on fulvestrant, which she also tolerates with no complaints  Finally, the patient also continues on denosumab/Xgeva, which she receives every 8 weeks.  She has had no problems with dentition --of course most of her teeth are "false teeth" but she does have 8 of her own and no problems with those  Since her last visit here she was admitted for hyponatremia associated with a urinary tract infection.  She had some problems from the antibiotics, vomited, had diarrhea, and her daughter aggressively hydrated her.  She also underwent a head CT without contrast on 04/01/2018 showing no evidence of acute intracranial abnormality.  Finally, she underwent a skull base to thigh PET scan on 04/22/2018 showing mixed response to therapy. Some sites demonstrate slight increase in hypermetabolism (anterior hepatic dome and right middle lobe pulmonary nodule), while others are decreased (posterior right hepatic dome and left axillary node). There are no new sites of disease identified. There is a decrease in hypermetabolic right-sided thyroid nodule. This is again of doubtful clinical significance given patient age and comorbidities.   REVIEW OF SYSTEMS: Michiah does not recall the hospital episode.  According to her daughter the hospitalists were "mostly" very helpful.  The patient has made a very good  recovery and she recently saw her primary care physician Dr.Gates.  [name]'s neuropathy is improved on gabapentin which she takes only at bedtime.  Currently she is back to her usual functional status which means she uses a recliner instead of sleeping in the bed, spends most of the day doing something around the house, but also has been going shopping and currently is receiving physical and occupational therapy at home.  A detailed review of systems was otherwise stable.   HISTORY OF PRESENT ILLNESS: From the prior summary:  [name] is an 84 y.o.  [place] woman I saw briefly when she moved to this area in 2007.  She had had a left breast cancer removed at [facility] in [place] in [phone] ([place]).  It was grade 2, measured 1 cm maximally and was strongly estrogen and progesterone-receptor positive.  HER-2 was not checked.  This was T1b NX invasive ductal carcinoma, stage I, and she was treated after radiation with anastrozole for several years, eventually switching to Evista primarily for cost reasons.   More recently, she presented to Dr. Inda Merlin with fatigue and a dry cough.  He auscultated dullness to percussion at the left base and obtained a chest x-ray which confirmed the presence of a left pleural effusion.   Dr. Inda Merlin scheduled [name] for an ultrasound-guided left thoracentesis, and this was performed November 09, 2010.  Approximately 1 L of amber-colored fluid was removed.  Cytology from this procedure ([id number]) showed only reactive mesothelial cells.   The patient was then referred here and set up for a CT of the chest and a PET scan.  The CT of the chest  on June 20th showed 2 slightly enlarged left axillary lymph nodes with some irregular contours but more importantly multiple pleural nodules in the left chest measuring up to 4.6 cm.  The right chest was normal.  There was no pericardial effusion.  There was only a small to moderate left pleural effusion remaining,  and aside from left lower lobe collapse or consolidation, there was no evidence of lung parenchymal involvement.  There was no evidence of bone involvement and also no calcified pleural plaques and no worrisome bone involvement.  PET scan on June 27th showed the nodularity along the left pleura to be intensely hypermetabolic.  For example the large lobe nodule measuring 4.5 cm had an SUV max of 12.9.  There was no hypermetabolic mediastinal nodal activity.  The to left axillary lymph nodes were very mildly hypermetabolic.  The left breast was clear.  There was 1 hypermetabolic lesion in the liver measuring 3.2 cm.  On June 27th, Dr. [name] performed fine-needle aspiration of a 7 mm abnormal-appearing lymph node in the lower left axilla.  The cytology from this procedure, however, ([name]) also was inconclusive showing no evidence of nodal tissue, mostly blood and fatty tissue.   Her subsequent history is as detailed below   PAST MEDICAL HISTORY: Past Medical History:  Diagnosis Date  . Breast cancer metastasized to multiple sites ([place]) 06/07/2011  . Cancer ([place])    breast ca  . Hypertension   1. Hypercholesterolemia. 2. Benign tremor. 3. Sigmoid diverticulosis. 4. Onychomycosis. 5. Irritable bowel syndrome. 6. GERD. 7. History of palpitations with Holter monitor in 2008 showing PACs. 8. History of hysterectomy with no salpingo-oophorectomy. 9. History of cholecystectomy. 10. History of bilateral knee arthroscopic surgery. History of hemorrhoid surgery.  FAMILY HISTORY [name] died in an automobile accident.  [name] died with "bone cancer."  The patient had 3 sisters.  One had breast cancer develop in her 105s.  There is also a cousin with breast cancer but no history of ovarian cancer or other history of cancer in first-degree relatives.  GYNECOLOGIC HISTORY: She is GX, P4, first pregnancy to term at age 52.  She was on the hormone replacement after her  hysterectomy but only briefly and stopped when she had the initial diagnosis of breast cancer in 1996.  SOCIAL HISTORY: (Updated May 2018) She worked as a Network engineer.  She has been widowed since 2005, her husband [name] dying after a long illness involving strokes and a myocardial infarction.  Her significant other [name] who is a retired Chief Financial Officer is now suffering from dementia and is in a skilled nursing facility. The patient lives with her daughter [name] who used to be a Network engineer for Dr. Inda Merlin, and [name]'s husband, [name], who is a physician's assistant with Dr. Durward Fortes.  The patient has 12 grandchildren and 5 great-grandchildren. Incidentally [name] has a son in [place].    ADVANCED DIRECTIVES: In place  HEALTH MAINTENANCE: (Updated 02/26/2013) Social History   Tobacco Use  . Smoking status: Former Smoker    Last attempt to quit: 05/22/1968    Years since quitting: 49.9  . Smokeless tobacco: Never Used  Substance Use Topics  . Alcohol use: Yes    Comment: 2 yearly  . Drug use: No     Colonoscopy:  PAP: s/p remote hysterectomy  Bone density:  10/23/2011, normal  Lipid panel: Dr. Inda Merlin, UTD   Allergies  Allergen Reactions  . Cantaloupe (Diagnostic)   . Ciprofloxacin   . Epinephrine   .  Pineapple   . Tamoxifen   . Mupirocin Rash    Current Outpatient Medications  Medication Sig Dispense Refill  . aspirin 81 MG tablet Take 81 mg by mouth every Monday,Wednesday,Friday, and Sunday at 6 PM.     . Calcium Carbonate-Vitamin D (CALCIUM-VITAMIN D) 500-200 MG-UNIT per tablet Take 1 tablet by mouth 2 (two) times daily with a meal.    . cholecalciferol (VITAMIN D) 400 UNITS TABS Take 2,000 Units by mouth daily.    Marland Kitchen denosumab (PROLIA) 60 MG/ML SOSY injection Inject into the skin.    Marland Kitchen esomeprazole (NEXIUM) 20 MG capsule Take 1 capsule (20 mg total) by mouth as needed.    . fluconazole (DIFLUCAN) 150 MG tablet as needed.    . gabapentin (NEURONTIN) 100 MG capsule  Take 1 tab at bedtime and may increase to bid if needed 60 capsule 3  . IBRANCE 100 MG capsule TAKE 1 CAPSULE (100 MG TOTAL) BY MOUTH DAILY WITH BREAKFAST. TAKE FOR 21 DAYS ON, 7 DAYS OFF, REPEAT EVERY 28 DAYS. TAKE WHOLE WITH FOOD. 21 capsule 6  . ketoconazole (NIZORAL) 2 % cream Apply 1 application topically daily as needed for irritation.     Marland Kitchen ketotifen (ZADITOR) 0.025 % ophthalmic solution Place 1 drop into both eyes as needed (itchy eyes).    Marland Kitchen levothyroxine (SYNTHROID, LEVOTHROID) 25 MCG tablet Take 25 mcg by mouth daily.    Marland Kitchen losartan (COZAAR) 50 MG tablet 25 mg.    . metroNIDAZOLE (METROGEL) 0.75 % gel Apply topically 2 (two) times daily. For rosacea    . Multiple Vitamin (MULTIVITAMIN) tablet Take 1 tablet by mouth daily.    . naproxen sodium (ALEVE) 220 MG tablet Take 220 mg by mouth as needed (knee pain).    . propranolol ER (INDERAL LA) 60 MG 24 hr capsule      No current facility-administered medications for this visit.     OBJECTIVE: Older white woman using a walker  Vitals:   05/06/18 1210  BP: (!) 152/56  Pulse: 73  Resp: 18  Temp: 98.3 F (36.8 C)  SpO2: 94%   Body mass index is 27.06 kg/m.  Filed Weights   05/06/18 1210  Weight: 160 lb 1.6 oz (72.6 kg)    Sclerae unicteric, pupils round and equal Oropharynx clear and moist No cervical or supraclavicular adenopathy Lungs no rales or rhonchi Heart regular rate and rhythm Abd soft, nontender, positive bowel sounds MSK no focal spinal tenderness, no upper extremity lymphedema Neuro: nonfocal, well oriented, appropriate affect Breasts: No suspicious masses noted in either breast.  Both axillae are benign.   LAB RESULTS: Lab Results  Component Value Date   WBC 3.0 (L) 05/06/2018   NEUTROABS 1.6 (L) 05/06/2018   HGB 9.5 (L) 05/06/2018   HCT 27.9 (L) 05/06/2018   MCV 104.5 (H) 05/06/2018   PLT 130 (L) 05/06/2018      Chemistry      Component Value Date/Time   NA 139 05/06/2018 1202   NA 129 (L)  05/07/2017 1045   K 4.5 05/06/2018 1202   K 5.0 05/07/2017 1045   CL 104 05/06/2018 1202   CL 104 10/24/2012 1014   CO2 25 05/06/2018 1202   CO2 25 05/07/2017 1045   BUN 24 (H) 05/06/2018 1202   BUN 17.6 05/07/2017 1045   CREATININE 1.19 (H) 05/06/2018 1202   CREATININE 1.70 (H) 07/09/2017 1133   CREATININE 1.1 05/07/2017 1045      Component Value Date/Time   CALCIUM  8.9 05/06/2018 1202   CALCIUM 9.3 05/07/2017 1045   ALKPHOS 35 (L) 05/06/2018 1202   ALKPHOS 37 (L) 05/07/2017 1045   AST 17 05/06/2018 1202   AST 20 05/07/2017 1045   ALT 14 05/06/2018 1202   ALT 13 05/07/2017 1045   BILITOT 0.7 05/06/2018 1202   BILITOT 1.55 (H) 05/07/2017 1045      IMAGING STUDIES: Nm Pet Image Restag (ps) Skull Base To Thigh  Result Date: 04/23/2018 CLINICAL DATA:  Subsequent treatment strategy restaging of breast cancer. Left breast primary. Known metastatic disease. EXAM: NUCLEAR MEDICINE PET SKULL BASE TO THIGH TECHNIQUE: 7.1 mCi F-18 FDG was injected intravenously. Full-ring PET imaging was performed from the skull base to thigh after the radiotracer. CT data was obtained and used for attenuation correction and anatomic localization. Fasting blood glucose: 154 mg/dl COMPARISON:  09/19/2017 FINDINGS: Mediastinal blood pool activity: SUV max 2.8 NECK: Right-sided thyroid nodule measures a S.U.V. max of 3.7 today versus a S.U.V. max of 5.6 on the prior exam. No cervical nodal hypermetabolism. Incidental CT findings: No cervical adenopathy. Cerebral atrophy is likely within normal variation for age. Right carotid atherosclerosis. CHEST: A left axillary node measures 11 mm and a S.U.V. max of 2.7 versus 12 mm and a S.U.V. max of 5.8 on the prior. A right middle lobe pulmonary nodule measures 1.1 cm and a S.U.V. max of 7.6 today versus 1.0 cm and a S.U.V. max of 6.2 on the prior. Incidental CT findings: Coronary and aortic atherosclerosis. ABDOMEN/PELVIS: Posterior right hepatic dome area of vague  hypoattenuation and hypermetabolism measures 4.9 cm and a S.U.V. max of 3.9 on image 97/4. Compare 5.0 cm and a S.U.V. max of 4.2 on the prior. More anterior hepatic dome hypoattenuating lesion measures 3.6 cm and a S.U.V. max of 7.7 on image 91/4. Compare 3.6 cm and a S.U.V. max of 5.8 on the prior. Left adrenal hypermetabolism without well-defined nodule. a S.U.V. max of 4.4 today versus a S.U.V. max of 3.0 on the prior. Incidental CT findings: Abdominal aortic atherosclerosis. Cholecystectomy. Pelvic floor laxity. SKELETON: No abnormal marrow activity. Incidental CT findings: Osteopenia. IMPRESSION: 1. Mixed response to therapy. Some sites demonstrate slight increase in hypermetabolism (anterior hepatic dome and right middle lobe pulmonary nodule), while others are decreased (posterior right hepatic dome and left axillary node). 2. No new sites of disease identified. 3. Decrease in hypermetabolic right-sided thyroid nodule. This is again of doubtful clinical significance given patient age and comorbidities. Electronically Signed   By: Abigail Miyamoto M.D.   On: 04/23/2018 11:41    ASSESSMENT: (1) Status post left lumpectomy in 1996 for a T1b N0 grade 2 invasive ductal carcinoma which was strongly estrogen and progesterone receptor positive, treated with radiation, then anastrozole, then Evista, all treatment discontinued July 2012  METASTATIC DISEASE: July 2012 (2) recurrence to the lining of the left lung and liver documented by liver biopsy July 2012, showing metastatic adenocarcinoma estrogen and progesterone receptor positive at 96% and 97% respectively; there was no Her-2 amplification.   (3)  She has been on exemestane since early July of 2012, with chest CT scan April 2013 showing complete resolution of her lung and liver metastases  (a) bone density in June of 08/09/2011 was normal  (b) bone density 03/20/2016 was normal with a T score of -0.6  (c) exemestane continued at the time of disease  progression noted May 2018  (d) exemestane discontinued April 2019  (4) MRI of the lumbar spine 09-2012 showed significant  degenerative disease but no evidence of metastatic disease to bone.  (5) disease progression noted May 2018: CT scan shows liver, lung, and bone lesions  (6) fulvestrant started 09/26/2016, palbociclib added 10/24/2016 at 125 mg daily, 21 days on. 7 days off  (a) dose decreased to 135m daily on 02/14/2017  (7) denosumab/Xgeva started 11/21/2016, repeated every 28 days until 11/19/2017, then every 8 weeks thereafter  (a) Xgeva changed to every 12 weeks beginning with the 05/06/2018 dose  PLAN:  RDeklynis now 7-1/2 years out from definitive diagnosis of metastatic breast cancer, with no clinical evidence of disease activity.  She has no symptoms related to her cancer.  She is also tolerating her treatment quite well.  The PET scan shows minimal change in either direction and basically stable disease.  The fact that her CA-27-29 also has continued to drop is encouraging.  Accordingly I am not making any change in her treatment.  I am changing the denosumab to every 3 months instead of every 2 months.  Otherwise she continues on palbociclib and Faslodex as before.  She will see me again 3 months from now.  Am not planning a study prior to that visit unless there is a change clinically or change in her tumor markers  I spent approximately 30 minutes face to face with Zailah with more than 50% of that time spent in counseling and coordination of care.    Japhet Morgenthaler, GVirgie Dad MD  05/06/18 12:55 PM Medical Oncology and Hematology [facility] StreetALake Waynoka [place] [address]Tel. [phone]   Fax. [phone]  I, AJacqualyn Poseyam acting as a sEducation administratorfor GChauncey Cruel MD.   I, GLurline DelMD, have reviewed the above documentation for accuracy and completeness, and I agree with the above.

## 2018-05-06 ENCOUNTER — Inpatient Hospital Stay (HOSPITAL_BASED_OUTPATIENT_CLINIC_OR_DEPARTMENT_OTHER): Payer: Medicare Other | Admitting: Oncology

## 2018-05-06 ENCOUNTER — Inpatient Hospital Stay: Payer: Medicare Other | Attending: Adult Health

## 2018-05-06 ENCOUNTER — Inpatient Hospital Stay: Payer: Medicare Other

## 2018-05-06 VITALS — BP 152/56 | HR 73 | Temp 98.3°F | Resp 18 | Ht 64.5 in | Wt 160.1 lb

## 2018-05-06 DIAGNOSIS — Z170 Estrogen receptor positive status [ER+]: Secondary | ICD-10-CM | POA: Diagnosis not present

## 2018-05-06 DIAGNOSIS — Z79899 Other long term (current) drug therapy: Secondary | ICD-10-CM | POA: Diagnosis not present

## 2018-05-06 DIAGNOSIS — C50919 Malignant neoplasm of unspecified site of unspecified female breast: Secondary | ICD-10-CM

## 2018-05-06 DIAGNOSIS — Z90710 Acquired absence of both cervix and uterus: Secondary | ICD-10-CM

## 2018-05-06 DIAGNOSIS — C799 Secondary malignant neoplasm of unspecified site: Secondary | ICD-10-CM | POA: Insufficient documentation

## 2018-05-06 DIAGNOSIS — Z87891 Personal history of nicotine dependence: Secondary | ICD-10-CM | POA: Diagnosis not present

## 2018-05-06 DIAGNOSIS — J410 Simple chronic bronchitis: Secondary | ICD-10-CM

## 2018-05-06 DIAGNOSIS — Z803 Family history of malignant neoplasm of breast: Secondary | ICD-10-CM | POA: Diagnosis not present

## 2018-05-06 DIAGNOSIS — I10 Essential (primary) hypertension: Secondary | ICD-10-CM | POA: Insufficient documentation

## 2018-05-06 DIAGNOSIS — R251 Tremor, unspecified: Secondary | ICD-10-CM | POA: Insufficient documentation

## 2018-05-06 DIAGNOSIS — J90 Pleural effusion, not elsewhere classified: Secondary | ICD-10-CM | POA: Insufficient documentation

## 2018-05-06 DIAGNOSIS — K589 Irritable bowel syndrome without diarrhea: Secondary | ICD-10-CM | POA: Insufficient documentation

## 2018-05-06 DIAGNOSIS — K219 Gastro-esophageal reflux disease without esophagitis: Secondary | ICD-10-CM | POA: Insufficient documentation

## 2018-05-06 DIAGNOSIS — Z7982 Long term (current) use of aspirin: Secondary | ICD-10-CM | POA: Diagnosis not present

## 2018-05-06 DIAGNOSIS — Z79818 Long term (current) use of other agents affecting estrogen receptors and estrogen levels: Secondary | ICD-10-CM | POA: Insufficient documentation

## 2018-05-06 DIAGNOSIS — E041 Nontoxic single thyroid nodule: Secondary | ICD-10-CM

## 2018-05-06 DIAGNOSIS — I700 Atherosclerosis of aorta: Secondary | ICD-10-CM | POA: Insufficient documentation

## 2018-05-06 DIAGNOSIS — I6521 Occlusion and stenosis of right carotid artery: Secondary | ICD-10-CM | POA: Insufficient documentation

## 2018-05-06 DIAGNOSIS — E7800 Pure hypercholesterolemia, unspecified: Secondary | ICD-10-CM | POA: Diagnosis not present

## 2018-05-06 DIAGNOSIS — C50812 Malignant neoplasm of overlapping sites of left female breast: Secondary | ICD-10-CM | POA: Diagnosis present

## 2018-05-06 DIAGNOSIS — C50912 Malignant neoplasm of unspecified site of left female breast: Secondary | ICD-10-CM

## 2018-05-06 DIAGNOSIS — M8580 Other specified disorders of bone density and structure, unspecified site: Secondary | ICD-10-CM | POA: Insufficient documentation

## 2018-05-06 DIAGNOSIS — G629 Polyneuropathy, unspecified: Secondary | ICD-10-CM | POA: Insufficient documentation

## 2018-05-06 DIAGNOSIS — C50922 Malignant neoplasm of unspecified site of left male breast: Secondary | ICD-10-CM

## 2018-05-06 DIAGNOSIS — Z9049 Acquired absence of other specified parts of digestive tract: Secondary | ICD-10-CM | POA: Insufficient documentation

## 2018-05-06 DIAGNOSIS — C50012 Malignant neoplasm of nipple and areola, left female breast: Secondary | ICD-10-CM

## 2018-05-06 LAB — CBC WITH DIFFERENTIAL/PLATELET
ABS IMMATURE GRANULOCYTES: 0 10*3/uL (ref 0.00–0.07)
BASOS ABS: 0 10*3/uL (ref 0.0–0.1)
Basophils Relative: 1 %
Eosinophils Absolute: 0.1 10*3/uL (ref 0.0–0.5)
Eosinophils Relative: 2 %
HEMATOCRIT: 27.9 % — AB (ref 36.0–46.0)
Hemoglobin: 9.5 g/dL — ABNORMAL LOW (ref 12.0–15.0)
IMMATURE GRANULOCYTES: 0 %
LYMPHS ABS: 1 10*3/uL (ref 0.7–4.0)
Lymphocytes Relative: 32 %
MCH: 35.6 pg — ABNORMAL HIGH (ref 26.0–34.0)
MCHC: 34.1 g/dL (ref 30.0–36.0)
MCV: 104.5 fL — AB (ref 80.0–100.0)
MONOS PCT: 12 %
Monocytes Absolute: 0.4 10*3/uL (ref 0.1–1.0)
NEUTROS ABS: 1.6 10*3/uL — AB (ref 1.7–7.7)
NEUTROS PCT: 53 %
NRBC: 0 % (ref 0.0–0.2)
PLATELETS: 130 10*3/uL — AB (ref 150–400)
RBC: 2.67 MIL/uL — ABNORMAL LOW (ref 3.87–5.11)
RDW: 13.7 % (ref 11.5–15.5)
WBC: 3 10*3/uL — ABNORMAL LOW (ref 4.0–10.5)

## 2018-05-06 LAB — COMPREHENSIVE METABOLIC PANEL
ALBUMIN: 3.9 g/dL (ref 3.5–5.0)
ALK PHOS: 35 U/L — AB (ref 38–126)
ALT: 14 U/L (ref 0–44)
ANION GAP: 10 (ref 5–15)
AST: 17 U/L (ref 15–41)
BUN: 24 mg/dL — ABNORMAL HIGH (ref 8–23)
CHLORIDE: 104 mmol/L (ref 98–111)
CO2: 25 mmol/L (ref 22–32)
Calcium: 8.9 mg/dL (ref 8.9–10.3)
Creatinine, Ser: 1.19 mg/dL — ABNORMAL HIGH (ref 0.44–1.00)
GFR calc Af Amer: 46 mL/min — ABNORMAL LOW (ref >60)
GFR calc non Af Amer: 40 mL/min — ABNORMAL LOW (ref >60)
GLUCOSE: 100 mg/dL — AB (ref 70–99)
POTASSIUM: 4.5 mmol/L (ref 3.5–5.1)
SODIUM: 139 mmol/L (ref 135–145)
Total Bilirubin: 0.7 mg/dL (ref 0.3–1.2)
Total Protein: 6.5 g/dL (ref 6.5–8.1)

## 2018-05-06 MED ORDER — FULVESTRANT 250 MG/5ML IM SOLN
INTRAMUSCULAR | Status: AC
  Filled 2018-05-06: qty 10

## 2018-05-06 MED ORDER — DENOSUMAB 120 MG/1.7ML ~~LOC~~ SOLN
SUBCUTANEOUS | Status: AC
  Filled 2018-05-06: qty 1.7

## 2018-05-06 MED ORDER — DENOSUMAB 120 MG/1.7ML ~~LOC~~ SOLN
120.0000 mg | Freq: Once | SUBCUTANEOUS | Status: AC
  Administered 2018-05-06: 120 mg via SUBCUTANEOUS

## 2018-05-06 MED ORDER — FULVESTRANT 250 MG/5ML IM SOLN
500.0000 mg | Freq: Once | INTRAMUSCULAR | Status: AC
  Administered 2018-05-06: 500 mg via INTRAMUSCULAR

## 2018-05-06 MED FILL — IBRANCE 100 MG CAPSULE: 100 | 28 days supply | Qty: 21 | Fill #4

## 2018-05-07 LAB — CANCER ANTIGEN 27.29: CAN 27.29: 139.5 U/mL — AB (ref 0.0–38.6)

## 2018-05-23 DIAGNOSIS — H6123 Impacted cerumen, bilateral: Secondary | ICD-10-CM | POA: Diagnosis not present

## 2018-05-23 DIAGNOSIS — H6122 Impacted cerumen, left ear: Secondary | ICD-10-CM | POA: Diagnosis not present

## 2018-05-23 DIAGNOSIS — H6121 Impacted cerumen, right ear: Secondary | ICD-10-CM | POA: Diagnosis not present

## 2018-06-03 ENCOUNTER — Inpatient Hospital Stay: Payer: PPO

## 2018-06-03 ENCOUNTER — Other Ambulatory Visit: Payer: Self-pay | Admitting: Oncology

## 2018-06-03 ENCOUNTER — Inpatient Hospital Stay: Payer: PPO | Attending: Adult Health

## 2018-06-03 VITALS — BP 166/73 | HR 66 | Temp 97.8°F | Resp 18

## 2018-06-03 DIAGNOSIS — C50919 Malignant neoplasm of unspecified site of unspecified female breast: Secondary | ICD-10-CM | POA: Diagnosis not present

## 2018-06-03 DIAGNOSIS — C50812 Malignant neoplasm of overlapping sites of left female breast: Secondary | ICD-10-CM | POA: Diagnosis not present

## 2018-06-03 DIAGNOSIS — I10 Essential (primary) hypertension: Secondary | ICD-10-CM | POA: Diagnosis not present

## 2018-06-03 DIAGNOSIS — Z79818 Long term (current) use of other agents affecting estrogen receptors and estrogen levels: Secondary | ICD-10-CM | POA: Diagnosis not present

## 2018-06-03 DIAGNOSIS — Z170 Estrogen receptor positive status [ER+]: Secondary | ICD-10-CM | POA: Diagnosis not present

## 2018-06-03 DIAGNOSIS — F4321 Adjustment disorder with depressed mood: Secondary | ICD-10-CM | POA: Diagnosis not present

## 2018-06-03 DIAGNOSIS — E039 Hypothyroidism, unspecified: Secondary | ICD-10-CM | POA: Diagnosis not present

## 2018-06-03 DIAGNOSIS — J410 Simple chronic bronchitis: Secondary | ICD-10-CM

## 2018-06-03 DIAGNOSIS — C50922 Malignant neoplasm of unspecified site of left male breast: Secondary | ICD-10-CM

## 2018-06-03 DIAGNOSIS — E782 Mixed hyperlipidemia: Secondary | ICD-10-CM | POA: Diagnosis not present

## 2018-06-03 DIAGNOSIS — J45991 Cough variant asthma: Secondary | ICD-10-CM | POA: Diagnosis not present

## 2018-06-03 DIAGNOSIS — C50912 Malignant neoplasm of unspecified site of left female breast: Secondary | ICD-10-CM

## 2018-06-03 LAB — CBC WITH DIFFERENTIAL/PLATELET
ABS IMMATURE GRANULOCYTES: 0.02 10*3/uL (ref 0.00–0.07)
Basophils Absolute: 0.1 10*3/uL (ref 0.0–0.1)
Basophils Relative: 2 %
Eosinophils Absolute: 0.2 10*3/uL (ref 0.0–0.5)
Eosinophils Relative: 4 %
HEMATOCRIT: 30.2 % — AB (ref 36.0–46.0)
HEMOGLOBIN: 10.1 g/dL — AB (ref 12.0–15.0)
Immature Granulocytes: 1 %
LYMPHS ABS: 1 10*3/uL (ref 0.7–4.0)
LYMPHS PCT: 28 %
MCH: 35.1 pg — AB (ref 26.0–34.0)
MCHC: 33.4 g/dL (ref 30.0–36.0)
MCV: 104.9 fL — AB (ref 80.0–100.0)
MONO ABS: 0.5 10*3/uL (ref 0.1–1.0)
MONOS PCT: 14 %
Neutro Abs: 1.8 10*3/uL (ref 1.7–7.7)
Neutrophils Relative %: 51 %
Platelets: 124 10*3/uL — ABNORMAL LOW (ref 150–400)
RBC: 2.88 MIL/uL — AB (ref 3.87–5.11)
RDW: 13.6 % (ref 11.5–15.5)
WBC: 3.5 10*3/uL — ABNORMAL LOW (ref 4.0–10.5)
nRBC: 0 % (ref 0.0–0.2)

## 2018-06-03 LAB — COMPREHENSIVE METABOLIC PANEL
ALK PHOS: 40 U/L (ref 38–126)
ALT: 12 U/L (ref 0–44)
ANION GAP: 8 (ref 5–15)
AST: 17 U/L (ref 15–41)
Albumin: 3.9 g/dL (ref 3.5–5.0)
BUN: 34 mg/dL — AB (ref 8–23)
CHLORIDE: 105 mmol/L (ref 98–111)
CO2: 27 mmol/L (ref 22–32)
Calcium: 10 mg/dL (ref 8.9–10.3)
Creatinine, Ser: 1.44 mg/dL — ABNORMAL HIGH (ref 0.44–1.00)
GFR calc non Af Amer: 31 mL/min — ABNORMAL LOW (ref >60)
GFR, EST AFRICAN AMERICAN: 36 mL/min — AB (ref >60)
GLUCOSE: 87 mg/dL (ref 70–99)
POTASSIUM: 5.5 mmol/L — AB (ref 3.5–5.1)
SODIUM: 140 mmol/L (ref 135–145)
Total Bilirubin: 0.8 mg/dL (ref 0.3–1.2)
Total Protein: 6.9 g/dL (ref 6.5–8.1)

## 2018-06-03 MED ORDER — DENOSUMAB 120 MG/1.7ML ~~LOC~~ SOLN: 120.0000 mg | Freq: Once | SUBCUTANEOUS | Status: DC

## 2018-06-03 MED ORDER — DENOSUMAB 120 MG/1.7ML ~~LOC~~ SOLN
SUBCUTANEOUS | Status: AC
  Filled 2018-06-03: qty 1.7

## 2018-06-03 MED ORDER — FULVESTRANT 250 MG/5ML IM SOLN
INTRAMUSCULAR | Status: AC
  Filled 2018-06-03: qty 5

## 2018-06-03 MED ORDER — FULVESTRANT 250 MG/5ML IM SOLN
500.0000 mg | Freq: Once | INTRAMUSCULAR | Status: AC
  Administered 2018-06-03: 500 mg via INTRAMUSCULAR

## 2018-06-03 MED FILL — IBRANCE 100 MG CAPSULE: 100 | 28 days supply | Qty: 21 | Fill #5

## 2018-06-03 NOTE — Progress Notes (Signed)
No xgeva needed today

## 2018-06-03 NOTE — Patient Instructions (Signed)

## 2018-06-04 LAB — CANCER ANTIGEN 27.29: CA 27.29: 142 U/mL — ABNORMAL HIGH (ref 0.0–38.6)

## 2018-06-07 DIAGNOSIS — L723 Sebaceous cyst: Secondary | ICD-10-CM | POA: Diagnosis not present

## 2018-07-01 ENCOUNTER — Inpatient Hospital Stay: Payer: PPO

## 2018-07-01 ENCOUNTER — Inpatient Hospital Stay: Payer: PPO | Attending: Adult Health

## 2018-07-01 VITALS — BP 167/73 | HR 68 | Temp 98.5°F | Resp 18

## 2018-07-01 DIAGNOSIS — C50919 Malignant neoplasm of unspecified site of unspecified female breast: Secondary | ICD-10-CM

## 2018-07-01 DIAGNOSIS — C50812 Malignant neoplasm of overlapping sites of left female breast: Secondary | ICD-10-CM | POA: Diagnosis not present

## 2018-07-01 DIAGNOSIS — C50912 Malignant neoplasm of unspecified site of left female breast: Secondary | ICD-10-CM

## 2018-07-01 DIAGNOSIS — Z170 Estrogen receptor positive status [ER+]: Principal | ICD-10-CM

## 2018-07-01 DIAGNOSIS — C50922 Malignant neoplasm of unspecified site of left male breast: Secondary | ICD-10-CM

## 2018-07-01 DIAGNOSIS — J410 Simple chronic bronchitis: Secondary | ICD-10-CM

## 2018-07-01 DIAGNOSIS — C799 Secondary malignant neoplasm of unspecified site: Secondary | ICD-10-CM | POA: Insufficient documentation

## 2018-07-01 LAB — CBC WITH DIFFERENTIAL/PLATELET
Abs Immature Granulocytes: 0.02 10*3/uL (ref 0.00–0.07)
Basophils Absolute: 0.1 10*3/uL (ref 0.0–0.1)
Basophils Relative: 2 %
EOS ABS: 0.1 10*3/uL (ref 0.0–0.5)
Eosinophils Relative: 1 %
HEMATOCRIT: 29.7 % — AB (ref 36.0–46.0)
HEMOGLOBIN: 9.9 g/dL — AB (ref 12.0–15.0)
Immature Granulocytes: 1 %
Lymphocytes Relative: 23 %
Lymphs Abs: 0.8 10*3/uL (ref 0.7–4.0)
MCH: 34.7 pg — ABNORMAL HIGH (ref 26.0–34.0)
MCHC: 33.3 g/dL (ref 30.0–36.0)
MCV: 104.2 fL — AB (ref 80.0–100.0)
Monocytes Absolute: 0.4 10*3/uL (ref 0.1–1.0)
Monocytes Relative: 12 %
NEUTROS ABS: 2.2 10*3/uL (ref 1.7–7.7)
NEUTROS PCT: 61 %
Platelets: 118 10*3/uL — ABNORMAL LOW (ref 150–400)
RBC: 2.85 MIL/uL — ABNORMAL LOW (ref 3.87–5.11)
RDW: 13.7 % (ref 11.5–15.5)
WBC: 3.5 10*3/uL — AB (ref 4.0–10.5)
nRBC: 0 % (ref 0.0–0.2)

## 2018-07-01 LAB — COMPREHENSIVE METABOLIC PANEL
ALBUMIN: 4 g/dL (ref 3.5–5.0)
ALK PHOS: 38 U/L (ref 38–126)
ALT: 12 U/L (ref 0–44)
ANION GAP: 8 (ref 5–15)
AST: 17 U/L (ref 15–41)
BILIRUBIN TOTAL: 1.1 mg/dL (ref 0.3–1.2)
BUN: 26 mg/dL — AB (ref 8–23)
CALCIUM: 9.4 mg/dL (ref 8.9–10.3)
CO2: 26 mmol/L (ref 22–32)
Chloride: 105 mmol/L (ref 98–111)
Creatinine, Ser: 1.37 mg/dL — ABNORMAL HIGH (ref 0.44–1.00)
GFR calc Af Amer: 39 mL/min — ABNORMAL LOW (ref >60)
GFR, EST NON AFRICAN AMERICAN: 33 mL/min — AB (ref >60)
Glucose, Bld: 104 mg/dL — ABNORMAL HIGH (ref 70–99)
Potassium: 4.7 mmol/L (ref 3.5–5.1)
Sodium: 139 mmol/L (ref 135–145)
Total Protein: 7 g/dL (ref 6.5–8.1)

## 2018-07-01 MED ORDER — FULVESTRANT 250 MG/5ML IM SOLN
INTRAMUSCULAR | Status: AC
  Filled 2018-07-01: qty 5

## 2018-07-01 MED ORDER — FULVESTRANT 250 MG/5ML IM SOLN
500.0000 mg | Freq: Once | INTRAMUSCULAR | Status: AC
  Administered 2018-07-01: 500 mg via INTRAMUSCULAR

## 2018-07-01 MED ORDER — DENOSUMAB 120 MG/1.7ML ~~LOC~~ SOLN
SUBCUTANEOUS | Status: AC
  Filled 2018-07-01: qty 1.7

## 2018-07-01 MED FILL — IBRANCE 100 MG CAPSULE: 100 | 28 days supply | Qty: 21 | Fill #6

## 2018-07-01 NOTE — Patient Instructions (Signed)

## 2018-07-02 LAB — CANCER ANTIGEN 27.29: CA 27.29: 137.9 U/mL — ABNORMAL HIGH (ref 0.0–38.6)

## 2018-07-03 DIAGNOSIS — R350 Frequency of micturition: Secondary | ICD-10-CM | POA: Diagnosis not present

## 2018-07-03 DIAGNOSIS — E039 Hypothyroidism, unspecified: Secondary | ICD-10-CM | POA: Diagnosis not present

## 2018-07-03 DIAGNOSIS — R300 Dysuria: Secondary | ICD-10-CM | POA: Diagnosis not present

## 2018-07-16 DIAGNOSIS — H6121 Impacted cerumen, right ear: Secondary | ICD-10-CM | POA: Diagnosis not present

## 2018-07-16 DIAGNOSIS — H6122 Impacted cerumen, left ear: Secondary | ICD-10-CM | POA: Diagnosis not present

## 2018-07-16 DIAGNOSIS — H6123 Impacted cerumen, bilateral: Secondary | ICD-10-CM | POA: Diagnosis not present

## 2018-07-19 ENCOUNTER — Other Ambulatory Visit: Payer: Self-pay | Admitting: Oncology

## 2018-07-22 ENCOUNTER — Other Ambulatory Visit: Payer: Self-pay | Admitting: Oncology

## 2018-07-22 DIAGNOSIS — C50912 Malignant neoplasm of unspecified site of left female breast: Secondary | ICD-10-CM

## 2018-07-29 ENCOUNTER — Inpatient Hospital Stay: Payer: PPO

## 2018-07-29 ENCOUNTER — Telehealth: Payer: Self-pay

## 2018-07-29 ENCOUNTER — Encounter: Payer: Self-pay | Admitting: Adult Health

## 2018-07-29 ENCOUNTER — Inpatient Hospital Stay: Payer: PPO | Attending: Adult Health

## 2018-07-29 ENCOUNTER — Telehealth: Payer: Self-pay | Admitting: Oncology

## 2018-07-29 ENCOUNTER — Inpatient Hospital Stay (HOSPITAL_BASED_OUTPATIENT_CLINIC_OR_DEPARTMENT_OTHER): Payer: PPO | Admitting: Adult Health

## 2018-07-29 VITALS — BP 177/71 | HR 67 | Temp 97.9°F | Resp 18 | Ht 64.5 in | Wt 162.1 lb

## 2018-07-29 DIAGNOSIS — K589 Irritable bowel syndrome without diarrhea: Secondary | ICD-10-CM | POA: Diagnosis not present

## 2018-07-29 DIAGNOSIS — Z79899 Other long term (current) drug therapy: Secondary | ICD-10-CM | POA: Diagnosis not present

## 2018-07-29 DIAGNOSIS — Z803 Family history of malignant neoplasm of breast: Secondary | ICD-10-CM | POA: Diagnosis not present

## 2018-07-29 DIAGNOSIS — R5383 Other fatigue: Secondary | ICD-10-CM | POA: Insufficient documentation

## 2018-07-29 DIAGNOSIS — E7800 Pure hypercholesterolemia, unspecified: Secondary | ICD-10-CM | POA: Diagnosis not present

## 2018-07-29 DIAGNOSIS — Z87891 Personal history of nicotine dependence: Secondary | ICD-10-CM

## 2018-07-29 DIAGNOSIS — K219 Gastro-esophageal reflux disease without esophagitis: Secondary | ICD-10-CM | POA: Insufficient documentation

## 2018-07-29 DIAGNOSIS — C50812 Malignant neoplasm of overlapping sites of left female breast: Secondary | ICD-10-CM | POA: Diagnosis not present

## 2018-07-29 DIAGNOSIS — Z809 Family history of malignant neoplasm, unspecified: Secondary | ICD-10-CM

## 2018-07-29 DIAGNOSIS — C7951 Secondary malignant neoplasm of bone: Secondary | ICD-10-CM

## 2018-07-29 DIAGNOSIS — C50919 Malignant neoplasm of unspecified site of unspecified female breast: Secondary | ICD-10-CM

## 2018-07-29 DIAGNOSIS — C7802 Secondary malignant neoplasm of left lung: Secondary | ICD-10-CM

## 2018-07-29 DIAGNOSIS — G250 Essential tremor: Secondary | ICD-10-CM

## 2018-07-29 DIAGNOSIS — Z79818 Long term (current) use of other agents affecting estrogen receptors and estrogen levels: Secondary | ICD-10-CM | POA: Diagnosis not present

## 2018-07-29 DIAGNOSIS — C787 Secondary malignant neoplasm of liver and intrahepatic bile duct: Secondary | ICD-10-CM

## 2018-07-29 DIAGNOSIS — Z170 Estrogen receptor positive status [ER+]: Secondary | ICD-10-CM

## 2018-07-29 DIAGNOSIS — I10 Essential (primary) hypertension: Secondary | ICD-10-CM | POA: Insufficient documentation

## 2018-07-29 DIAGNOSIS — C50912 Malignant neoplasm of unspecified site of left female breast: Secondary | ICD-10-CM

## 2018-07-29 DIAGNOSIS — Z90710 Acquired absence of both cervix and uterus: Secondary | ICD-10-CM

## 2018-07-29 DIAGNOSIS — J410 Simple chronic bronchitis: Secondary | ICD-10-CM

## 2018-07-29 LAB — CBC WITH DIFFERENTIAL/PLATELET
Abs Immature Granulocytes: 0.01 10*3/uL (ref 0.00–0.07)
BASOS PCT: 2 %
Basophils Absolute: 0.1 10*3/uL (ref 0.0–0.1)
EOS PCT: 2 %
Eosinophils Absolute: 0.1 10*3/uL (ref 0.0–0.5)
HEMATOCRIT: 29.9 % — AB (ref 36.0–46.0)
HEMOGLOBIN: 9.8 g/dL — AB (ref 12.0–15.0)
Immature Granulocytes: 0 %
Lymphocytes Relative: 35 %
Lymphs Abs: 1 10*3/uL (ref 0.7–4.0)
MCH: 34.5 pg — AB (ref 26.0–34.0)
MCHC: 32.8 g/dL (ref 30.0–36.0)
MCV: 105.3 fL — ABNORMAL HIGH (ref 80.0–100.0)
MONO ABS: 0.3 10*3/uL (ref 0.1–1.0)
Monocytes Relative: 12 %
Neutro Abs: 1.3 10*3/uL — ABNORMAL LOW (ref 1.7–7.7)
Neutrophils Relative %: 49 %
Platelets: 121 10*3/uL — ABNORMAL LOW (ref 150–400)
RBC: 2.84 MIL/uL — AB (ref 3.87–5.11)
RDW: 13.8 % (ref 11.5–15.5)
WBC: 2.7 10*3/uL — AB (ref 4.0–10.5)
nRBC: 0 % (ref 0.0–0.2)

## 2018-07-29 LAB — COMPREHENSIVE METABOLIC PANEL
ALK PHOS: 36 U/L — AB (ref 38–126)
ALT: 11 U/L (ref 0–44)
AST: 16 U/L (ref 15–41)
Albumin: 3.8 g/dL (ref 3.5–5.0)
Anion gap: 9 (ref 5–15)
BILIRUBIN TOTAL: 0.7 mg/dL (ref 0.3–1.2)
BUN: 28 mg/dL — AB (ref 8–23)
CALCIUM: 9 mg/dL (ref 8.9–10.3)
CO2: 24 mmol/L (ref 22–32)
CREATININE: 1.28 mg/dL — AB (ref 0.44–1.00)
Chloride: 108 mmol/L (ref 98–111)
GFR calc Af Amer: 42 mL/min — ABNORMAL LOW (ref >60)
GFR, EST NON AFRICAN AMERICAN: 36 mL/min — AB (ref >60)
Glucose, Bld: 84 mg/dL (ref 70–99)
POTASSIUM: 5.4 mmol/L — AB (ref 3.5–5.1)
Sodium: 141 mmol/L (ref 135–145)
TOTAL PROTEIN: 6.8 g/dL (ref 6.5–8.1)

## 2018-07-29 MED ORDER — FULVESTRANT 250 MG/5ML IM SOLN
INTRAMUSCULAR | Status: AC
  Filled 2018-07-29: qty 10

## 2018-07-29 MED ORDER — FULVESTRANT 250 MG/5ML IM SOLN
500.0000 mg | Freq: Once | INTRAMUSCULAR | Status: AC
  Administered 2018-07-29: 500 mg via INTRAMUSCULAR

## 2018-07-29 MED FILL — IBRANCE 100 MG CAPSULE: 100 | 28 days supply | Qty: 21 | Fill #0

## 2018-07-29 NOTE — Telephone Encounter (Signed)
-----   Message from Gardenia Phlegm, NP sent at 07/29/2018  3:38 PM EDT ----- Please call patient and ensure not on any potassium ----- Message ----- From: Interface, Lab In [place] Sent: 07/29/2018  11:31 AM EDT To: Chauncey Cruel, MD

## 2018-07-29 NOTE — Telephone Encounter (Signed)
Gave patient avs report and appointments for April thru June. Scheduled by A. Smith.

## 2018-07-29 NOTE — Progress Notes (Signed)
ID: Theresa Wallace   DOB: 02-15-1924  MR#: [id number]  NTI#:[id number]   Patient Care Team: [name], MD as PCP - General (Internal Medicine) GYN: SU:  OTHER MD:  CHIEF COMPLAINT:  Metastatic Breast Cancer, estrogen receptor positive  CURRENT TREATMENT: fulvestrant, palbociclib, Delton See   INTERVAL HISTORY: [name] returns today for follow-up of her estrogen receptor positive metastatic breast cancer. She is accompanied by her  The patient continues on palbociclib, which she tolerates with no side effects that she is aware of.  She will begin her next cycle on Wednesday.  She is slightly more tired with this cycle.    The patient also continues on fulvestrant, which she also tolerates with no complaints  Finally, the patient also continues on denosumab/Xgeva, which she receives every 12 weeks.   REVIEW OF SYSTEMS: Theresa Wallace is doing moderately well today.  She has been slightly more fatigued.  She has had a scratchy throat and plans to get some allergy medication today.  She denies any new issues, such as pain, nausea, vomiting, bowel/bladder changes.  She hasn't had any cough, shortness of breath.  She denies fevers or chills.  Otherwise, a detailed ROS was conducted and non contributory.     HISTORY OF PRESENT ILLNESS: From the prior summary:  Theresa Wallace is an 85 y.o.  [place] woman I saw briefly when she moved to this area in 2007.  She had had a left breast cancer removed at [facility] in [place] in [phone] ([place]).  It was grade 2, measured 1 cm maximally and was strongly estrogen and progesterone-receptor positive.  HER-2 was not checked.  This was T1b NX invasive ductal carcinoma, stage I, and she was treated after radiation with anastrozole for several years, eventually switching to Evista primarily for cost reasons.   More recently, she presented to Dr. Inda Merlin with fatigue and a dry cough.  He auscultated dullness to percussion at the left base and  obtained a chest x-ray which confirmed the presence of a left pleural effusion.   Dr. Inda Merlin scheduled Theresa Wallace for an ultrasound-guided left thoracentesis, and this was performed November 09, 2010.  Approximately 1 L of amber-colored fluid was removed.  Cytology from this procedure ([id number]) showed only reactive mesothelial cells.   The patient was then referred here and set up for a CT of the chest and a PET scan.  The CT of the chest on June 20th showed 2 slightly enlarged left axillary lymph nodes with some irregular contours but more importantly multiple pleural nodules in the left chest measuring up to 4.6 cm.  The right chest was normal.  There was no pericardial effusion.  There was only a small to moderate left pleural effusion remaining, and aside from left lower lobe collapse or consolidation, there was no evidence of lung parenchymal involvement.  There was no evidence of bone involvement and also no calcified pleural plaques and no worrisome bone involvement.  PET scan on June 27th showed the nodularity along the left pleura to be intensely hypermetabolic.  For example the large lobe nodule measuring 4.5 cm had an SUV max of 12.9.  There was no hypermetabolic mediastinal nodal activity.  The to left axillary lymph nodes were very mildly hypermetabolic.  The left breast was clear.  There was 1 hypermetabolic lesion in the liver measuring 3.2 cm.  On June 27th, Dr. [name] performed fine-needle aspiration of a 7 mm abnormal-appearing lymph node in the lower left axilla.  The cytology from  this procedure, however, ([name]) also was inconclusive showing no evidence of nodal tissue, mostly blood and fatty tissue.   Her subsequent history is as detailed below   PAST MEDICAL HISTORY: Past Medical History:  Diagnosis Date  . Breast cancer metastasized to multiple sites ([place]) 06/07/2011  . Cancer ([place])    breast ca  . Hypertension   1. Hypercholesterolemia. 2. Benign tremor. 3. Sigmoid  diverticulosis. 4. Onychomycosis. 5. Irritable bowel syndrome. 6. GERD. 7. History of palpitations with Holter monitor in 2008 showing PACs. 8. History of hysterectomy with no salpingo-oophorectomy. 9. History of cholecystectomy. 10. History of bilateral knee arthroscopic surgery. History of hemorrhoid surgery.  FAMILY HISTORY [name] died in an automobile accident.  [name] died with "bone cancer."  The patient had 3 sisters.  One had breast cancer develop in her 35s.  There is also a cousin with breast cancer but no history of ovarian cancer or other history of cancer in first-degree relatives.  GYNECOLOGIC HISTORY: She is GX, P4, first pregnancy to term at age 85.  She was on the hormone replacement after her hysterectomy but only briefly and stopped when she had the initial diagnosis of breast cancer in 1996.  SOCIAL HISTORY: (Updated May 2018) She worked as a Network engineer.  She has been widowed since 2005, her husband [name] dying after a long illness involving strokes and a myocardial infarction.  Her significant other [name] who is a retired Chief Financial Officer is now suffering from dementia and is in a skilled nursing facility. The patient lives with her daughter Theresa Wallace who used to be a Network engineer for Dr. Inda Merlin, and [name]'s husband, Theresa Wallace, who is a physician's assistant with Dr. Durward Fortes.  The patient has 12 grandchildren and 5 great-grandchildren. Incidentally [name] has a son in [place].    ADVANCED DIRECTIVES: In place  HEALTH MAINTENANCE: (Updated 02/26/2013) Social History   Tobacco Use  . Smoking status: Former Smoker    Last attempt to quit: 05/22/1968    Years since quitting: 50.2  . Smokeless tobacco: Never Used  Substance Use Topics  . Alcohol use: Yes    Comment: 2 yearly  . Drug use: No     Colonoscopy:  PAP: s/p remote hysterectomy  Bone density:  10/23/2011, normal  Lipid panel: Dr. Inda Merlin, UTD   Allergies  Allergen Reactions   . Cantaloupe (Diagnostic)   . Ciprofloxacin   . Epinephrine   . Pineapple   . Tamoxifen   . Mupirocin Rash    Current Outpatient Medications  Medication Sig Dispense Refill  . aspirin 81 MG tablet Take 81 mg by mouth every Monday,Wednesday,Friday, and Sunday at 6 PM.     . Calcium Carbonate-Vitamin D (CALCIUM-VITAMIN D) 500-200 MG-UNIT per tablet Take 1 tablet by mouth 2 (two) times daily with a meal.    . cholecalciferol (VITAMIN D) 400 UNITS TABS Take 2,000 Units by mouth daily.    Marland Kitchen denosumab (PROLIA) 60 MG/ML SOSY injection Inject into the skin.    Marland Kitchen esomeprazole (NEXIUM) 20 MG capsule Take 1 capsule (20 mg total) by mouth as needed.    . fluconazole (DIFLUCAN) 150 MG tablet as needed.    . gabapentin (NEURONTIN) 100 MG capsule TAKE ONE CAPSULE BY MOUTH EVERY NIGHT AT BEDTIME AND MAY INCREASE TO TWO TIMES A DAY IF NEEDED 60 capsule 2  . IBRANCE 100 MG capsule TAKE 1 CAPSULE (100 MG TOTAL) BY MOUTH DAILY WITH BREAKFAST. TAKE FOR 21 DAYS ON, 7 DAYS OFF, REPEAT  EVERY 28 DAYS. TAKE WHOLE WITH FOOD. 21 capsule 3  . ketoconazole (NIZORAL) 2 % cream Apply 1 application topically daily as needed for irritation.     Marland Kitchen ketotifen (ZADITOR) 0.025 % ophthalmic solution Place 1 drop into both eyes as needed (itchy eyes).    Marland Kitchen levothyroxine (SYNTHROID, LEVOTHROID) 25 MCG tablet Take 25 mcg by mouth daily.    Marland Kitchen losartan (COZAAR) 50 MG tablet 25 mg.    . metroNIDAZOLE (METROGEL) 0.75 % gel Apply topically 2 (two) times daily. For rosacea    . Multiple Vitamin (MULTIVITAMIN) tablet Take 1 tablet by mouth daily.    . naproxen sodium (ALEVE) 220 MG tablet Take 220 mg by mouth as needed (knee pain).    . propranolol ER (INDERAL LA) 60 MG 24 hr capsule      No current facility-administered medications for this visit.     OBJECTIVE: Vitals:   07/29/18 1150  BP: (!) 177/71  Pulse: 67  Resp: 18  Temp: 97.9 F (36.6 C)  SpO2: 98%   Body mass index is 27.39 kg/m.  Filed Weights   07/29/18 1150   Weight: 162 lb 1.6 oz (73.5 kg)   GENERAL: Patient is a well appearing female in no acute distress HEENT:  Sclerae anicteric.  Oropharynx clear and moist. No ulcerations or evidence of oropharyngeal candidiasis. Neck is supple.  NODES:  No cervical, supraclavicular, or axillary lymphadenopathy palpated.  BREAST EXAM:  Deferred. LUNGS:  Clear to auscultation bilaterally.  No wheezes or rhonchi. HEART:  Regular rate and rhythm. No murmur appreciated. ABDOMEN:  Soft, nontender.  Positive, normoactive bowel sounds. No organomegaly palpated. MSK:  No focal spinal tenderness to palpation. Full range of motion bilaterally in the upper extremities. EXTREMITIES:  No peripheral edema.   SKIN:  Clear with no obvious rashes or skin changes. No nail dyscrasia. NEURO:  Nonfocal. Well oriented.  Appropriate affect.     LAB RESULTS: Lab Results  Component Value Date   WBC 2.7 (L) 07/29/2018   NEUTROABS 1.3 (L) 07/29/2018   HGB 9.8 (L) 07/29/2018   HCT 29.9 (L) 07/29/2018   MCV 105.3 (H) 07/29/2018   PLT 121 (L) 07/29/2018      Chemistry      Component Value Date/Time   NA 139 07/01/2018 1120   NA 129 (L) 05/07/2017 1045   K 4.7 07/01/2018 1120   K 5.0 05/07/2017 1045   CL 105 07/01/2018 1120   CL 104 10/24/2012 1014   CO2 26 07/01/2018 1120   CO2 25 05/07/2017 1045   BUN 26 (H) 07/01/2018 1120   BUN 17.6 05/07/2017 1045   CREATININE 1.37 (H) 07/01/2018 1120   CREATININE 1.70 (H) 07/09/2017 1133   CREATININE 1.1 05/07/2017 1045      Component Value Date/Time   CALCIUM 9.4 07/01/2018 1120   CALCIUM 9.3 05/07/2017 1045   ALKPHOS 38 07/01/2018 1120   ALKPHOS 37 (L) 05/07/2017 1045   AST 17 07/01/2018 1120   AST 20 05/07/2017 1045   ALT 12 07/01/2018 1120   ALT 13 05/07/2017 1045   BILITOT 1.1 07/01/2018 1120   BILITOT 1.55 (H) 05/07/2017 1045      IMAGING STUDIES: No results found.  ASSESSMENT: (1) Status post left lumpectomy in 1996 for a T1b N0 grade 2 invasive ductal  carcinoma which was strongly estrogen and progesterone receptor positive, treated with radiation, then anastrozole, then Evista, all treatment discontinued July 2012  METASTATIC DISEASE: July 2012 (2) recurrence to the lining of  the left lung and liver documented by liver biopsy July 2012, showing metastatic adenocarcinoma estrogen and progesterone receptor positive at 96% and 97% respectively; there was no Her-2 amplification.   (3)  She has been on exemestane since early July of 2012, with chest CT scan April 2013 showing complete resolution of her lung and liver metastases  (a) bone density in June of 08/09/2011 was normal  (b) bone density 03/20/2016 was normal with a T score of -0.6  (c) exemestane continued at the time of disease progression noted May 2018  (d) exemestane discontinued April 2019  (4) MRI of the lumbar spine 09-2012 showed significant degenerative disease but no evidence of metastatic disease to bone.  (5) disease progression noted May 2018: CT scan shows liver, lung, and bone lesions  (6) fulvestrant started 09/26/2016, palbociclib added 10/24/2016 at 125 mg daily, 21 days on. 7 days off  (a) dose decreased to 132m daily on 02/14/2017  (7) denosumab/Xgeva started 11/21/2016, repeated every 28 days until 11/19/2017, then every 8 weeks thereafter  (a) Xgeva changed to every 12 weeks beginning with the 05/06/2018 dose  PLAN:  RArlisis doing well today.  She is clinically without any sign of progression.  I reviewed her labs.  Her anc is 1.3.  She will proceed with her Palbociclib to start on Wednesday.  They will let me know if she has any issues with the medication or worsening fatigue.    She will also receive her Fulvestrant and Xgeva today.  She will continue these.    She will be due for restaging PET/CT at the end of May, 2020.  I have placed orders for this today.  She will return every 4 weeks for labs and injection, and on 6/1 will also see Dr. MJana Hakimto review  those results.    Berit and her daughter know to call for any questions or concerns prior to her next appointment with [place]    A total of (30) minutes of face-to-face time was spent with this patient with greater than 50% of that time in counseling and care-coordination.   LWilber Bihari NP 07/29/18 12:17 PM Medical Oncology and Hematology [facility] [id number]. [phone]   Fax. [phone]

## 2018-07-29 NOTE — Telephone Encounter (Signed)
Spoke with patient's daughter, [name]. Nurse let [name] know that potassium was slightly elevated and inquired if patient is taking potassium.  [name] said her mother does not take potassium.  States that "I will watch her diet.  We have been eating a lot of greens."    Per daughter, Patient scheduled to get [facility] tomorrow since machine in lab down today.

## 2018-07-29 NOTE — Telephone Encounter (Signed)
Lab analyzer down patient/dtr came to scheduling to reschedule 3/9 injection to 3/10. Other appointments remain the same.

## 2018-07-30 ENCOUNTER — Inpatient Hospital Stay: Payer: PPO

## 2018-07-30 DIAGNOSIS — C50812 Malignant neoplasm of overlapping sites of left female breast: Secondary | ICD-10-CM | POA: Diagnosis not present

## 2018-07-30 DIAGNOSIS — Z170 Estrogen receptor positive status [ER+]: Principal | ICD-10-CM

## 2018-07-30 DIAGNOSIS — C50922 Malignant neoplasm of unspecified site of left male breast: Secondary | ICD-10-CM

## 2018-07-30 DIAGNOSIS — C50919 Malignant neoplasm of unspecified site of unspecified female breast: Secondary | ICD-10-CM

## 2018-07-30 LAB — CANCER ANTIGEN 27.29: CA 27.29: 157.7 U/mL — ABNORMAL HIGH (ref 0.0–38.6)

## 2018-07-30 MED ORDER — DENOSUMAB 120 MG/1.7ML ~~LOC~~ SOLN
SUBCUTANEOUS | Status: AC
  Filled 2018-07-30: qty 1.7

## 2018-07-30 MED ORDER — DENOSUMAB 120 MG/1.7ML ~~LOC~~ SOLN
120.0000 mg | Freq: Once | SUBCUTANEOUS | Status: AC
  Administered 2018-07-30: 120 mg via SUBCUTANEOUS

## 2018-08-17 MED FILL — IBRANCE 100 MG CAPSULE: 100 | 28 days supply | Qty: 21 | Fill #1

## 2018-08-27 ENCOUNTER — Inpatient Hospital Stay: Payer: PPO

## 2018-08-27 ENCOUNTER — Inpatient Hospital Stay: Payer: PPO | Attending: Adult Health

## 2018-08-27 ENCOUNTER — Other Ambulatory Visit: Payer: Self-pay

## 2018-08-27 VITALS — BP 163/75 | HR 70 | Temp 98.4°F | Resp 18

## 2018-08-27 DIAGNOSIS — C50812 Malignant neoplasm of overlapping sites of left female breast: Secondary | ICD-10-CM

## 2018-08-27 DIAGNOSIS — C50919 Malignant neoplasm of unspecified site of unspecified female breast: Secondary | ICD-10-CM

## 2018-08-27 DIAGNOSIS — Z170 Estrogen receptor positive status [ER+]: Principal | ICD-10-CM

## 2018-08-27 DIAGNOSIS — C50912 Malignant neoplasm of unspecified site of left female breast: Secondary | ICD-10-CM

## 2018-08-27 DIAGNOSIS — C50922 Malignant neoplasm of unspecified site of left male breast: Secondary | ICD-10-CM

## 2018-08-27 DIAGNOSIS — J410 Simple chronic bronchitis: Secondary | ICD-10-CM

## 2018-08-27 LAB — CBC WITH DIFFERENTIAL/PLATELET
Abs Immature Granulocytes: 0.02 10*3/uL (ref 0.00–0.07)
Basophils Absolute: 0 10*3/uL (ref 0.0–0.1)
Basophils Relative: 1 %
Eosinophils Absolute: 0.1 10*3/uL (ref 0.0–0.5)
Eosinophils Relative: 2 %
HCT: 29.8 % — ABNORMAL LOW (ref 36.0–46.0)
Hemoglobin: 10 g/dL — ABNORMAL LOW (ref 12.0–15.0)
Immature Granulocytes: 1 %
Lymphocytes Relative: 26 %
Lymphs Abs: 0.9 10*3/uL (ref 0.7–4.0)
MCH: 35 pg — ABNORMAL HIGH (ref 26.0–34.0)
MCHC: 33.6 g/dL (ref 30.0–36.0)
MCV: 104.2 fL — ABNORMAL HIGH (ref 80.0–100.0)
Monocytes Absolute: 0.5 10*3/uL (ref 0.1–1.0)
Monocytes Relative: 15 %
Neutro Abs: 1.8 10*3/uL (ref 1.7–7.7)
Neutrophils Relative %: 55 %
Platelets: 131 10*3/uL — ABNORMAL LOW (ref 150–400)
RBC: 2.86 MIL/uL — ABNORMAL LOW (ref 3.87–5.11)
RDW: 13.9 % (ref 11.5–15.5)
WBC: 3.3 10*3/uL — ABNORMAL LOW (ref 4.0–10.5)
nRBC: 0 % (ref 0.0–0.2)

## 2018-08-27 LAB — COMPREHENSIVE METABOLIC PANEL
ALT: 12 U/L (ref 0–44)
AST: 18 U/L (ref 15–41)
Albumin: 3.9 g/dL (ref 3.5–5.0)
Alkaline Phosphatase: 37 U/L — ABNORMAL LOW (ref 38–126)
Anion gap: 8 (ref 5–15)
BUN: 24 mg/dL — ABNORMAL HIGH (ref 8–23)
CO2: 26 mmol/L (ref 22–32)
Calcium: 9.3 mg/dL (ref 8.9–10.3)
Chloride: 105 mmol/L (ref 98–111)
Creatinine, Ser: 1.29 mg/dL — ABNORMAL HIGH (ref 0.44–1.00)
GFR calc Af Amer: 42 mL/min — ABNORMAL LOW (ref >60)
GFR calc non Af Amer: 36 mL/min — ABNORMAL LOW (ref >60)
Glucose, Bld: 97 mg/dL (ref 70–99)
Potassium: 5 mmol/L (ref 3.5–5.1)
Sodium: 139 mmol/L (ref 135–145)
Total Bilirubin: 0.7 mg/dL (ref 0.3–1.2)
Total Protein: 6.8 g/dL (ref 6.5–8.1)

## 2018-08-27 MED ORDER — DENOSUMAB 120 MG/1.7ML ~~LOC~~ SOLN
SUBCUTANEOUS | Status: AC
  Filled 2018-08-27: qty 1.7

## 2018-08-27 MED ORDER — DENOSUMAB 120 MG/1.7ML ~~LOC~~ SOLN: 120.0000 mg | Freq: Once | SUBCUTANEOUS | Status: DC

## 2018-08-27 MED ORDER — FULVESTRANT 250 MG/5ML IM SOLN: 500.0000 mg | Freq: Once | INTRAMUSCULAR | Status: DC

## 2018-08-27 MED ORDER — FULVESTRANT 250 MG/5ML IM SOLN
INTRAMUSCULAR | Status: AC
  Filled 2018-08-27: qty 5

## 2018-08-27 NOTE — Patient Instructions (Signed)

## 2018-08-28 LAB — CANCER ANTIGEN 27.29: CA 27.29: 161.4 U/mL — ABNORMAL HIGH (ref 0.0–38.6)

## 2018-09-18 MED FILL — IBRANCE 100 MG CAPSULE: 100 | 28 days supply | Qty: 21 | Fill #2

## 2018-09-19 ENCOUNTER — Other Ambulatory Visit: Payer: Self-pay | Admitting: *Deleted

## 2018-09-19 DIAGNOSIS — C50812 Malignant neoplasm of overlapping sites of left female breast: Secondary | ICD-10-CM

## 2018-09-19 DIAGNOSIS — Z170 Estrogen receptor positive status [ER+]: Principal | ICD-10-CM

## 2018-09-23 ENCOUNTER — Inpatient Hospital Stay: Payer: PPO

## 2018-09-23 ENCOUNTER — Other Ambulatory Visit: Payer: Self-pay

## 2018-09-23 ENCOUNTER — Inpatient Hospital Stay: Payer: PPO | Attending: Adult Health

## 2018-09-23 VITALS — BP 164/74 | HR 65 | Resp 18

## 2018-09-23 DIAGNOSIS — J410 Simple chronic bronchitis: Secondary | ICD-10-CM

## 2018-09-23 DIAGNOSIS — Z79818 Long term (current) use of other agents affecting estrogen receptors and estrogen levels: Secondary | ICD-10-CM | POA: Diagnosis not present

## 2018-09-23 DIAGNOSIS — C50919 Malignant neoplasm of unspecified site of unspecified female breast: Secondary | ICD-10-CM

## 2018-09-23 DIAGNOSIS — Z170 Estrogen receptor positive status [ER+]: Secondary | ICD-10-CM | POA: Diagnosis not present

## 2018-09-23 DIAGNOSIS — C50812 Malignant neoplasm of overlapping sites of left female breast: Secondary | ICD-10-CM | POA: Diagnosis not present

## 2018-09-23 DIAGNOSIS — C50912 Malignant neoplasm of unspecified site of left female breast: Secondary | ICD-10-CM

## 2018-09-23 DIAGNOSIS — C50922 Malignant neoplasm of unspecified site of left male breast: Secondary | ICD-10-CM

## 2018-09-23 LAB — CMP (CANCER CENTER ONLY)
ALT: 10 U/L (ref 0–44)
AST: 16 U/L (ref 15–41)
Albumin: 3.9 g/dL (ref 3.5–5.0)
Alkaline Phosphatase: 33 U/L — ABNORMAL LOW (ref 38–126)
Anion gap: 8 (ref 5–15)
BUN: 35 mg/dL — ABNORMAL HIGH (ref 8–23)
CO2: 26 mmol/L (ref 22–32)
Calcium: 9.2 mg/dL (ref 8.9–10.3)
Chloride: 107 mmol/L (ref 98–111)
Creatinine: 1.48 mg/dL — ABNORMAL HIGH (ref 0.44–1.00)
GFR, Est AFR Am: 35 mL/min — ABNORMAL LOW (ref >60)
GFR, Estimated: 30 mL/min — ABNORMAL LOW (ref >60)
Glucose, Bld: 94 mg/dL (ref 70–99)
Potassium: 4.6 mmol/L (ref 3.5–5.1)
Sodium: 141 mmol/L (ref 135–145)
Total Bilirubin: 0.8 mg/dL (ref 0.3–1.2)
Total Protein: 7 g/dL (ref 6.5–8.1)

## 2018-09-23 LAB — CBC WITH DIFFERENTIAL (CANCER CENTER ONLY)
Abs Immature Granulocytes: 0.01 10*3/uL (ref 0.00–0.07)
Basophils Absolute: 0.1 10*3/uL (ref 0.0–0.1)
Basophils Relative: 2 %
Eosinophils Absolute: 0.1 10*3/uL (ref 0.0–0.5)
Eosinophils Relative: 2 %
HCT: 30.4 % — ABNORMAL LOW (ref 36.0–46.0)
Hemoglobin: 10 g/dL — ABNORMAL LOW (ref 12.0–15.0)
Immature Granulocytes: 0 %
Lymphocytes Relative: 33 %
Lymphs Abs: 0.9 10*3/uL (ref 0.7–4.0)
MCH: 34.5 pg — ABNORMAL HIGH (ref 26.0–34.0)
MCHC: 32.9 g/dL (ref 30.0–36.0)
MCV: 104.8 fL — ABNORMAL HIGH (ref 80.0–100.0)
Monocytes Absolute: 0.3 10*3/uL (ref 0.1–1.0)
Monocytes Relative: 12 %
Neutro Abs: 1.4 10*3/uL — ABNORMAL LOW (ref 1.7–7.7)
Neutrophils Relative %: 51 %
Platelet Count: 111 10*3/uL — ABNORMAL LOW (ref 150–400)
RBC: 2.9 MIL/uL — ABNORMAL LOW (ref 3.87–5.11)
RDW: 13.8 % (ref 11.5–15.5)
WBC Count: 2.7 10*3/uL — ABNORMAL LOW (ref 4.0–10.5)
nRBC: 0 % (ref 0.0–0.2)

## 2018-09-23 MED ORDER — FULVESTRANT 250 MG/5ML IM SOLN
500.0000 mg | Freq: Once | INTRAMUSCULAR | Status: AC
  Administered 2018-09-23: 500 mg via INTRAMUSCULAR

## 2018-09-23 MED ORDER — FULVESTRANT 250 MG/5ML IM SOLN
INTRAMUSCULAR | Status: AC
  Filled 2018-09-23: qty 10

## 2018-09-23 NOTE — Patient Instructions (Signed)

## 2018-09-24 LAB — CANCER ANTIGEN 27.29: CA 27.29: 149.5 U/mL — ABNORMAL HIGH (ref 0.0–38.6)

## 2018-10-16 ENCOUNTER — Other Ambulatory Visit: Payer: Self-pay

## 2018-10-16 ENCOUNTER — Encounter (HOSPITAL_COMMUNITY)
Admission: RE | Admit: 2018-10-16 | Discharge: 2018-10-16 | Disposition: A | Discharge status: Home / Self Care | Payer: PPO | Source: Ambulatory Visit | Attending: Adult Health | Admitting: Adult Health

## 2018-10-16 DIAGNOSIS — C50919 Malignant neoplasm of unspecified site of unspecified female breast: Secondary | ICD-10-CM | POA: Diagnosis not present

## 2018-10-16 DIAGNOSIS — K769 Liver disease, unspecified: Secondary | ICD-10-CM | POA: Insufficient documentation

## 2018-10-16 DIAGNOSIS — C50912 Malignant neoplasm of unspecified site of left female breast: Secondary | ICD-10-CM | POA: Diagnosis not present

## 2018-10-16 DIAGNOSIS — I2510 Atherosclerotic heart disease of native coronary artery without angina pectoris: Secondary | ICD-10-CM | POA: Diagnosis not present

## 2018-10-16 LAB — GLUCOSE, CAPILLARY: Glucose-Capillary: 97 mg/dL (ref 70–99)

## 2018-10-16 MED ORDER — FLUDEOXYGLUCOSE F - 18 (FDG) INJECTION
8.0700 | Freq: Once | INTRAVENOUS | Status: AC | PRN
  Administered 2018-10-16: 8.07 via INTRAVENOUS

## 2018-10-17 MED FILL — IBRANCE 100 MG CAPSULE: 100 | 28 days supply | Qty: 21 | Fill #3

## 2018-10-19 DIAGNOSIS — C50912 Malignant neoplasm of unspecified site of left female breast: Secondary | ICD-10-CM | POA: Diagnosis not present

## 2018-10-19 DIAGNOSIS — C50919 Malignant neoplasm of unspecified site of unspecified female breast: Secondary | ICD-10-CM | POA: Diagnosis not present

## 2018-10-19 DIAGNOSIS — E039 Hypothyroidism, unspecified: Secondary | ICD-10-CM | POA: Diagnosis not present

## 2018-10-19 DIAGNOSIS — E782 Mixed hyperlipidemia: Secondary | ICD-10-CM | POA: Diagnosis not present

## 2018-10-19 DIAGNOSIS — I10 Essential (primary) hypertension: Secondary | ICD-10-CM | POA: Diagnosis not present

## 2018-10-19 DIAGNOSIS — F4321 Adjustment disorder with depressed mood: Secondary | ICD-10-CM | POA: Diagnosis not present

## 2018-10-19 DIAGNOSIS — J45991 Cough variant asthma: Secondary | ICD-10-CM | POA: Diagnosis not present

## 2018-10-20 ENCOUNTER — Encounter: Payer: Self-pay | Admitting: Oncology

## 2018-10-20 NOTE — Progress Notes (Signed)
ID: Theresa Wallace   DOB: Dec 01, 1923  MR#: [id number]  MAU#:[id number]   Patient Care Team: Theresa Huddle, MD as PCP - General (Internal Medicine) Wallace, Theresa Dad, MD as Consulting Physician (Oncology) Theresa Provost, PA-C as Consulting Physician (Otolaryngology) OTHER MD:  I connected with Theresa Wallace on 10/21/18 at 12:00 PM EDT by telephone visit and verified that I am speaking with the correct person using two identifiers.   I discussed the limitations, risks, security and privacy concerns of performing an evaluation and management service by telemedicine and the availability of in-person appointments. I also discussed with the patient that there may be a patient responsible charge related to this service. The patient expressed understanding and agreed to proceed.   Other persons participating in the visit and their role in the encounter: Daughter Theresa Wallace; Theresa Wallace, scribe   Patient's location: home  Provider's location: [place]    CHIEF COMPLAINT:  Metastatic Breast Cancer, estrogen receptor positive  CURRENT TREATMENT: letrozole, ribociclib, denosumab/ Xgeva   INTERVAL HISTORY: Theresa Wallace was contacted by phone today for follow-up and treatment of her estrogen receptor positive metastatic breast cancer.   She continues on palbociclib, which she tolerates with no side effects that she is aware of.    The patient also has been receiving fulvestrant every 4 weeks, which she also tolerates with no complaints.  Finally, the patient also continues on denosumab/Xgeva, which she receives every 12 weeks.  Her most recent dose was 07/30/2018 and she will be due anytime this or next month for repeat  Since her last visit, she underwent PET scan on 10/16/2018. Results reveal: mild increase and degree of FDG uptake associated with the index dome of liver lesion, no significant change in FDG avid index left axillary lymph node and right middle lobe pulmonary nodule;  tiny, FDG avid subpleural nodule within the medial aspect of the superior segment of left lower lobe, which demonstrated mild increase in size and FDG uptake compared with previous exam; no new sites of disease.   REVIEW OF SYSTEMS: Theresa Wallace reports doing okay overall. The patient denies unusual headaches, visual changes, nausea, vomiting, stiff neck, dizziness, or gait imbalance. There has been no cough, phlegm production, or pleurisy, no chest pain or pressure, and no change in bowel or bladder habits. The patient denies fever, rash, bleeding, unexplained fatigue or unexplained weight loss. A detailed review of systems was otherwise entirely negative.   HISTORY OF PRESENT ILLNESS: From the prior summary:  Theresa Wallace is an 85 y.o.  Theresa Wallace woman I saw briefly when she moved to this area in 2007.  She had had a left breast cancer removed at [facility] in [place] in [phone] (Theresa Wallace).  It was grade 2, measured 1 cm maximally and was strongly estrogen and progesterone-receptor positive.  HER-2 was not checked.  This was T1b NX invasive ductal carcinoma, stage I, and she was treated after radiation with anastrozole for several years, eventually switching to Evista primarily for cost reasons.   More recently, she presented to Theresa Wallace with fatigue and a dry cough.  He auscultated dullness to percussion at the left base and obtained a chest x-ray which confirmed the presence of a left pleural effusion.   Theresa Wallace for an ultrasound-guided left thoracentesis, and this was performed November 09, 2010.  Approximately 1 L of amber-colored fluid was removed.  Cytology from this procedure ([id number]) showed only reactive mesothelial cells.   The patient was then  referred here and set up for a CT of the chest and a PET scan.  The CT of the chest on June 20th showed 2 slightly enlarged left axillary lymph nodes with some irregular contours but more importantly multiple  pleural nodules in the left chest measuring up to 4.6 cm.  The right chest was normal.  There was no pericardial effusion.  There was only a small to moderate left pleural effusion remaining, and aside from left lower lobe collapse or consolidation, there was no evidence of lung parenchymal involvement.  There was no evidence of bone involvement and also no calcified pleural plaques and no worrisome bone involvement.  PET scan on June 27th showed the nodularity along the left pleura to be intensely hypermetabolic.  For example the large lobe nodule measuring 4.5 cm had an SUV max of 12.9.  There was no hypermetabolic mediastinal nodal activity.  The to left axillary lymph nodes were very mildly hypermetabolic.  The left breast was clear.  There was 1 hypermetabolic lesion in the liver measuring 3.2 cm.  On June 27th, Dr. [name] performed fine-needle aspiration of a 7 mm abnormal-appearing lymph node in the lower left axilla.  The cytology from this procedure, however, ([name]) also was inconclusive showing no evidence of nodal tissue, mostly blood and fatty tissue.   Her subsequent history is as detailed below   PAST MEDICAL HISTORY: Past Medical History:  Diagnosis Date  . Breast cancer metastasized to multiple sites ([place]) 06/07/2011  . Cancer ([place])    breast ca  . GERD (gastroesophageal reflux disease)   . Hypercholesterolemia   . Hypertension   . Irritable bowel syndrome (IBS)   . Onychomycosis   . Sigmoid diverticulosis   Benign tremor. History of palpitations with Holter monitor in 2008 showing PACs.   PAST SURGICAL HISTORY: Past Surgical History:  Procedure Laterality Date  . ABDOMINAL HYSTERECTOMY    . BREAST LUMPECTOMY    . CATARACT EXTRACTION    . CHOLECYSTECTOMY    . KNEE ARTHROSCOPY      FAMILY HISTORY Family History  Problem Relation Age of Onset  . Breast cancer Sister 36  . Breast cancer Cousin        dx in her 52s  . Bone cancer Mother   [name]  died in an automobile accident.  [name] died with "bone cancer."  The patient had 3 sisters.  One had breast cancer develop in her 18s.  There is also a cousin with breast cancer but no history of ovarian cancer or other history of cancer in first-degree relatives.   GYNECOLOGIC HISTORY: No LMP recorded. Patient has had a hysterectomy. She is GX, P4, first pregnancy to term at age 78.  She was on the hormone replacement after her hysterectomy but only briefly and stopped when she had the initial diagnosis of breast cancer in 1996. She underwent hysterectomy without BSO.   SOCIAL HISTORY: (Updated May 2018) She worked as a Network engineer.  She has been widowed since 2005, her husband [name] dying after a long illness involving strokes and a myocardial infarction.  Her significant other [name] who is a retired Chief Financial Officer is now suffering from dementia and is in a skilled nursing facility. The patient lives with her daughter Theresa Wallace who used to be a Network engineer for Theresa Wallace, and [name]'s husband, [name], who is a physician's assistant with Dr. Durward Fortes.  The patient has 12 grandchildren and 5 great-grandchildren. Incidentally [name] has a son in  [place].     ADVANCED DIRECTIVES: In place   HEALTH MAINTENANCE: (Updated 02/26/2013) Social History   Tobacco Use  . Smoking status: Former Smoker    Last attempt to quit: 05/22/1968    Years since quitting: 50.4  . Smokeless tobacco: Never Used  Substance Use Topics  . Alcohol use: Yes    Comment: 2 yearly  . Drug use: No     Colonoscopy:  PAP: s/p remote hysterectomy  Bone density:  10/23/2011, normal  Lipid panel: Theresa Wallace, UTD   Allergies  Allergen Reactions  . Cantaloupe (Diagnostic)   . Ciprofloxacin   . Epinephrine   . Pineapple   . Tamoxifen   . Mupirocin Rash    Current Outpatient Medications  Medication Sig Dispense Refill  . aspirin 81 MG tablet Take 81 mg by mouth every Monday,Wednesday,Friday,  and Sunday at 6 PM.     . Calcium Carbonate-Vitamin D (CALCIUM-VITAMIN D) 500-200 MG-UNIT per tablet Take 1 tablet by mouth 2 (two) times daily with a meal.    . cholecalciferol (VITAMIN D) 400 UNITS TABS Take 2,000 Units by mouth daily.    . Denosumab (XGEVA [place]) Inject into the skin.    Marland Kitchen esomeprazole (NEXIUM) 20 MG capsule Take 1 capsule (20 mg total) by mouth as needed.    . fluconazole (DIFLUCAN) 150 MG tablet as needed.    . gabapentin (NEURONTIN) 100 MG capsule TAKE ONE CAPSULE BY MOUTH EVERY NIGHT AT BEDTIME AND MAY INCREASE TO TWO TIMES A DAY IF NEEDED 60 capsule 2  . ketoconazole (NIZORAL) 2 % cream Apply 1 application topically daily as needed for irritation.     Marland Kitchen ketotifen (ZADITOR) 0.025 % ophthalmic solution Place 1 drop into both eyes as needed (itchy eyes).    Marland Kitchen letrozole (FEMARA) 2.5 MG tablet Take 1 tablet (2.5 mg total) by mouth daily. 90 tablet 4  . levothyroxine (SYNTHROID, LEVOTHROID) 25 MCG tablet Take 25 mcg by mouth daily.    Marland Kitchen losartan (COZAAR) 50 MG tablet 25 mg.    . metroNIDAZOLE (METROGEL) 0.75 % gel Apply topically 2 (two) times daily. For rosacea    . Multiple Vitamin (MULTIVITAMIN) tablet Take 1 tablet by mouth daily.    . naproxen sodium (ALEVE) 220 MG tablet Take 220 mg by mouth as needed (knee pain).    . propranolol ER (INDERAL LA) 60 MG 24 hr capsule      No current facility-administered medications for this visit.     OBJECTIVE: Older white woman in no acute distress There were no vitals filed for this visit. There is no height or weight on file to calculate BMI.  There were no vitals filed for this visit.   LAB RESULTS: Lab Results  Component Value Date   WBC 2.7 (L) 09/23/2018   NEUTROABS 1.4 (L) 09/23/2018   HGB 10.0 (L) 09/23/2018   HCT 30.4 (L) 09/23/2018   MCV 104.8 (H) 09/23/2018   PLT 111 (L) 09/23/2018      Chemistry      Component Value Date/Time   NA 141 09/23/2018 1058   NA 129 (L) 05/07/2017 1045   K 4.6 09/23/2018 1058    K 5.0 05/07/2017 1045   CL 107 09/23/2018 1058   CL 104 10/24/2012 1014   CO2 26 09/23/2018 1058   CO2 25 05/07/2017 1045   BUN 35 (H) 09/23/2018 1058   BUN 17.6 05/07/2017 1045   CREATININE 1.48 (H) 09/23/2018 1058   CREATININE 1.1 05/07/2017 1045  Component Value Date/Time   CALCIUM 9.2 09/23/2018 1058   CALCIUM 9.3 05/07/2017 1045   ALKPHOS 33 (L) 09/23/2018 1058   ALKPHOS 37 (L) 05/07/2017 1045   AST 16 09/23/2018 1058   AST 20 05/07/2017 1045   ALT 10 09/23/2018 1058   ALT 13 05/07/2017 1045   BILITOT 0.8 09/23/2018 1058   BILITOT 1.55 (H) 05/07/2017 1045      IMAGING STUDIES: Nm Pet Image Restag (ps) Skull Base To Thigh  Result Date: 10/16/2018 CLINICAL DATA:  Subsequent treatment strategy for left breast cancer. EXAM: NUCLEAR MEDICINE PET SKULL BASE TO THIGH TECHNIQUE: 8.07 mCi F-18 FDG was injected intravenously. Full-ring PET imaging was performed from the skull base to thigh after the radiotracer. CT data was obtained and used for attenuation correction and anatomic localization. Fasting blood glucose: 97 mg/dl COMPARISON:  04/23/2018 FINDINGS: Mediastinal blood pool activity: SUV max 3.5 Liver activity: SUV max NA NECK: Small nodule within right side of thyroid gland has an SUV max of 3.7 today versus 3.7 previously. Incidental CT findings: none CHEST: -index left axillary node measures 1 cm within SUV max of 2.66. Previously 1.1 cm within SUV max of 2.7. -Index nodule within the right middle lobe measures 1 cm and has an SUV max of 6.58. Previously this measured 1 cm within SUV max of 7.6. -Within the medial aspect of the superior segment of left lower lobe there is a small subpleural nodule measuring 0.7 cm within SUV max of 4.16. In retrospect this was present on previous exam measuring 6 mm within SUV max of 2.9. Incidental CT findings: Aortic atherosclerosis. Lad and RCA coronary artery calcifications. ABDOMEN/PELVIS: Mass within dome of liver measures 4.6 cm and has an  SUV max of 9.0. On the previous exam this measured 3.6 within SUV max of 7.7. No appreciable uptake above background liver identified within the posterior right dome of liver area of vague hypoattenuation. Similar to previous exam. -mild uptake in left adrenal gland has an SUV max of 3.58. Previously 4.4. No abnormal uptake within the pancreas, spleen or right adrenal gland. No hypermetabolic abdominopelvic lymph nodes. Incidental CT findings: Aortic atherosclerosis. Previous cholecystectomy. SKELETON: No focal hypermetabolic activity to suggest skeletal metastasis. Incidental CT findings: none IMPRESSION: 1. There has been mild increase and degree of FDG uptake associated with the index dome of liver lesion. No significant change in FDG avid index left axillary lymph node and right middle lobe pulmonary nodule. 2. Tiny, FDG avid subpleural nodule within the medial aspect of the superior segment of left lower lobe. This demonstrates mild increase in size and FDG uptake compared with previous exam. 3. No new sites of disease. 4. Aortic Atherosclerosis (ICD10-I70.0). Coronary artery calcifications. Electronically Signed   By: Kerby Moors M.D.   On: 10/16/2018 10:18    ASSESSMENT: 85 y.o. BRCA negative Theresa Wallace woman:  (1) Status post left lumpectomy in 1996 for a T1b N0 grade 2 invasive ductal carcinoma which was strongly estrogen and progesterone receptor positive, treated with radiation, then anastrozole, then Evista, all treatment discontinued July 2012  METASTATIC DISEASE: July 2012 (2) recurrence to the lining of the left lung and liver documented by liver biopsy July 2012, showing metastatic adenocarcinoma estrogen and progesterone receptor positive at 96% and 97% respectively; there was no Her-2 amplification.   (a) left axillary lymph node biopsy 09/27/2016 shows breast cancer, 95% estrogen repeat receptor positive with strong staining intensity, progesterone and HER-2 negative.  (3)  She has  been on exemestane  since early July of 2012, with chest CT scan April 2013 showing complete resolution of her lung and liver metastases  (a) bone density in June of 08/09/2011 was normal  (b) bone density 03/20/2016 was normal with a T score of -0.6  (c) exemestane continued at the time of disease progression noted May 2018  (d) exemestane discontinued April 2019  (4) MRI of the lumbar spine 09-2012 showed significant degenerative disease but no evidence of metastatic disease to bone.  (5) disease progression noted May 2018: CT scan shows liver, lung, and bone lesions  (6) fulvestrant started 09/26/2016, palbociclib added 10/24/2016 at 125 mg daily, 21 days on. 7 days off  (a) palbociclib dose decreased to 164m daily on 02/14/2017  (b) PET scan 10/16/2018 shows progression in the liver no new sites of disease  (c) fulvestrant discontinued; letrozole substituted  (d) palbociclib discontinued, ribociclib substituted  (e) consider consult to interventional radiology for Yttrium treatment of liver lesion  (7) denosumab/Xgeva started 11/21/2016, repeated every 28 days until 11/19/2017, then every 8 weeks thereafter  (a) Xgeva changed to every 12 weeks beginning with the 05/06/2018 dose  (8) Caris requested 10/21/2018, from 09/27/2016 lymph node biopsy  PLAN:  RTamariis clinically very stable and her PET scan remains generally favorable, but there has been definite increase in her liver lesion.  This is of concern.  I have discussed this with interventional radiology and they think yttrium treatment may be possible.  They question the long-term benefit given the patient's very advanced age  I think it may be more prudent especially given the current pandemic to simply change her medications and repeat an MRI of the liver in 3 months.  Accordingly she is going off fulvestrant and I have placed an order for her to start letrozole.  She does have palbociclib which she will receive this month.  A  month from now however we will start ribociclib.  I am going to start her out only 200 mg daily for the first cycle.  We can adjust the dose subsequently as tolerated  I am holding off on the denosumab Xgeva until we have a little bit more clearance regarding what will happen regarding the current coronavirus situation.  I do not want to expose her unnecessarily  We will connect by WebEx in 1 month just before she starts her ribociclib  They know to call for any other issue that may develop before the next visit.  GVirgie Dad Magrinat, MD 10/21/18 12:27 PM Medical Oncology and Hematology [facility] [address]Tel. [phone]   Fax. [phone]  I, KWilburn Wallace am acting as scribe for Dr. GVirgie Dad Wallace.  I, GLurline DelMD, have reviewed the above documentation for accuracy and completeness, and I agree with the above.

## 2018-10-21 ENCOUNTER — Inpatient Hospital Stay (HOSPITAL_BASED_OUTPATIENT_CLINIC_OR_DEPARTMENT_OTHER): Payer: PPO | Admitting: Oncology

## 2018-10-21 ENCOUNTER — Inpatient Hospital Stay: Payer: PPO | Attending: Adult Health

## 2018-10-21 ENCOUNTER — Inpatient Hospital Stay: Payer: PPO

## 2018-10-21 DIAGNOSIS — Z87891 Personal history of nicotine dependence: Secondary | ICD-10-CM

## 2018-10-21 DIAGNOSIS — J45991 Cough variant asthma: Secondary | ICD-10-CM | POA: Diagnosis not present

## 2018-10-21 DIAGNOSIS — Z79811 Long term (current) use of aromatase inhibitors: Secondary | ICD-10-CM

## 2018-10-21 DIAGNOSIS — E782 Mixed hyperlipidemia: Secondary | ICD-10-CM | POA: Diagnosis not present

## 2018-10-21 DIAGNOSIS — C50812 Malignant neoplasm of overlapping sites of left female breast: Secondary | ICD-10-CM

## 2018-10-21 DIAGNOSIS — E039 Hypothyroidism, unspecified: Secondary | ICD-10-CM | POA: Diagnosis not present

## 2018-10-21 DIAGNOSIS — C50919 Malignant neoplasm of unspecified site of unspecified female breast: Secondary | ICD-10-CM | POA: Diagnosis not present

## 2018-10-21 DIAGNOSIS — Z79899 Other long term (current) drug therapy: Secondary | ICD-10-CM

## 2018-10-21 DIAGNOSIS — F4321 Adjustment disorder with depressed mood: Secondary | ICD-10-CM | POA: Diagnosis not present

## 2018-10-21 DIAGNOSIS — Z7982 Long term (current) use of aspirin: Secondary | ICD-10-CM | POA: Diagnosis not present

## 2018-10-21 DIAGNOSIS — Z170 Estrogen receptor positive status [ER+]: Secondary | ICD-10-CM | POA: Diagnosis not present

## 2018-10-21 DIAGNOSIS — I10 Essential (primary) hypertension: Secondary | ICD-10-CM | POA: Diagnosis not present

## 2018-10-21 DIAGNOSIS — C50912 Malignant neoplasm of unspecified site of left female breast: Secondary | ICD-10-CM | POA: Diagnosis not present

## 2018-10-21 MED ORDER — RIBOCICLIB SUCC (200 MG DOSE) 200 MG PO TBPK: 200.0000 mg | ORAL_TABLET | Freq: Every day | ORAL | 6 refills | Status: DC

## 2018-10-21 MED ORDER — LETROZOLE 2.5 MG PO TABS: 2.5000 mg | ORAL_TABLET | Freq: Every day | ORAL | 4 refills | Status: DC

## 2018-10-22 ENCOUNTER — Telehealth: Payer: Self-pay | Admitting: *Deleted

## 2018-10-22 ENCOUNTER — Encounter: Payer: Self-pay | Admitting: Oncology

## 2018-10-22 ENCOUNTER — Telehealth: Payer: Self-pay | Admitting: Pharmacist

## 2018-10-22 ENCOUNTER — Telehealth: Payer: Self-pay

## 2018-10-22 NOTE — Telephone Encounter (Signed)
Oral Oncology Pharmacist Encounter  Received new prescription for Kisqali (ribociclib) for the treatment of metastatic, hormone receptor positive breast cancer in conjunction with Femara (letrozole), planned duration until disease progression or unacceptable toxicity.  Original diagnosis of breast cancer in 1996 treated with lumpectomy and adjuvant radiation, followed by endocrine therapy. Metastatic disease was first documented in July 2012 and was started on alternate endocrine therapy with exemestane. Disease progression was again noted in May 2018 and patient was started on treatment with palbociclib and fulvestrant Imaging performed in May 2020 shows mostly stable disease with some progression in her liver lesions. Patient is now under evaluation to change therapy to [facility] plus letrozole, and consult has been placed with interventional radiology for yttrium treatment of the liver lesion. Patient is going to complete her next cycle of Ibrance which starts tomorrow, 10/23/2018, with plan to change to [facility] starting 11/20/2018  Thelma Comp is planned to be initiated at decreased dose of 200 mg once daily taken for 3 weeks on, one-week off, and repeated. Kisqali dose may be increased to target dose of 600 mg once daily taken for 3 weeks on, one-week off, and repeated, pending toleration.  Labs from 09/23/2018 assessed, OK for treatment initiation. SCr=1.48, est CrCl ~ 25-30 mL/min SCr appears slightly above patient's baseline, normally 1.2-1.3, est CrCl ~ 30-35 mL/min For eGFR 15 to < 30 mL/min manufacturer recommends dose reduction at initiation 234m once daily, as has been done already by MD  04/01/18 EKG shows QTc at 477 msec  Kisqali is associated with concentration dependent QT prolongation with an estimated mean increase in the QT interval exceeding 20 msec at mean steady-state QT interval changes occurred within the initial 4 weeks of Kisqali therapy and were reversible with treatment  interruption Manufacturer recommends assessment of QT interval at baseline, cycle 1 day 15, and the start of cycle 2.  Current medication list in Epic reviewed, no significant DDIs with Kisqali identified.  [facility].  Oral Oncology Clinic will continue to follow for insurance authorization, copayment issues, initial counseling and start date.  JJohny Drilling PharmD, BCPS, BCOP  10/22/2018 8:25 AM Oral Oncology Clinic [phone]

## 2018-10-22 NOTE — Telephone Encounter (Signed)
Oral Oncology Patient Advocate Encounter  Received notification from [facility] that prior authorization for Theresa Wallace is required.  PA submitted on CoverMyMeds Key [id number] Status is pending  Oral Oncology Clinic will continue to follow.  [place] Patient [place] Phone [phone] Fax [phone] 10/22/2018    12:04 PM

## 2018-10-22 NOTE — Telephone Encounter (Signed)
Request for Caris Study sent today with verification of received fax.

## 2018-10-22 NOTE — Telephone Encounter (Signed)
Oral Oncology Patient Advocate Encounter  Prior Authorization for Theresa Wallace has been approved.    PA# [id number] Effective dates: 10/22/18 through 10/22/19  Oral Oncology Clinic will continue to follow.   [place] Patient [place] Phone [phone] Fax [phone] 10/22/2018    12:36 PM

## 2018-10-24 NOTE — Telephone Encounter (Addendum)
Oral Oncology Pharmacist Encounter  I called and spoke briefly with patient's daughter, [name], for an update on new Kisqali start. Planned start date is after next cycle of Ibrance, on 11/20/2018. [name] informed that insurance authorization has been approved and current foundation copayment grant that covers out-of-pocket expenses for [facility] will be applied for [employer]. We will plan to touch base with [name] the week of 11/11/2018 for medication acquisition details.  We briefly discussed similarities and differences between patient's Ibrance and new medicine Kisqali.  I have reached out to MD to inquire about repeat EKG prior to [facility] initiation. I will update [name] with any information I have regarding this as well.  [name] provided direct dial to oral oncology clinic. All questions answered. She knows to call the office with any additional questions or concerns.  Johny Drilling, PharmD, BCPS, BCOP  10/24/2018 11:20 AM Oral Oncology Clinic [phone]

## 2018-10-29 ENCOUNTER — Telehealth: Payer: Self-pay | Admitting: Pharmacist

## 2018-10-29 NOTE — Telephone Encounter (Signed)
Oral Chemotherapy Pharmacist Encounter   I spoke with patient's daughter, [name], for overview of: Kisqali (ribociclib) for the treatment of metastatic, hormone receptor positive breast cancer in conjunction with Femara (letrozole), planned duration until disease progression or unacceptable toxicity.  Thelma Comp is planned to be initiated at decreased dose of 200 mg once daily taken for 3 weeks on, one-week off, and repeated. Kisqali dose may be increased to target dose of 600 mg once daily taken for 3 weeks on, one-week off, and repeated, pending toleration.  Counseled on administration, dosing, side effects, monitoring, drug-food interactions, safe handling, storage, and disposal.  Patient will take Kisqali 200mg  tablets, 1 tablet (200mg ) by mouth once daily without regard to food.  Patient will take Kisqali for 3 weeks on, 1 week off, repeat every 4 weeks.  Patient knows to avoid grapefruit and grapefruit juice.  Kisqali start date: 11/20/2018  Patient with follow-up at PCP office on 12/11/2018. MD has been alerted if he would like to receive CBC check or EKG without having patient come to [facility] during [place] pandemic.  Adverse effects include but are not limited to: nausea, vomiting, diarrhea, constipation, fatigue, rash, decreased blood counts, and altered cardiac conduction. Severe, life-threatening, and/or fatal interstitial lung disease (ILD) and/or pneumonitis may occur with CDK 4/6 inhibitors.  Patient has anti-emetic on hand and knows to take it if nausea develops.   Patient will obtain anti diarrheal and alert the office of 4 or more loose stools above baseline.  Reviewed importance of keeping a medication schedule and plan for any missed doses.  Medication reconciliation performed and medication/allergy list updated.  Insurance authorization for Thelma Comp has been obtained. Test claim at the pharmacy revealed copayment $8.95 for 1st fill of Kisqali. This will be covered by  existing copayment grant secured with patient access now foundation for $0 out of pocket cost to patient. This will ship from the [facility] on 11/12/2018 to deliver to patient's home on 11/13/2018.  [name] informed the pharmacy will reach out 5-7 days prior to needing next fill of Kisqali to coordinate continued medication acquisition to prevent break in therapy.  All questions answered.  [name] voiced understanding and appreciation.   They know to call the office with questions or concerns.  Johny Drilling, PharmD, BCPS, BCOP  10/29/2018   3:14 PM Oral Oncology Clinic [phone]

## 2018-10-30 ENCOUNTER — Other Ambulatory Visit: Payer: Self-pay | Admitting: Oncology

## 2018-10-30 ENCOUNTER — Encounter: Payer: Self-pay | Admitting: Oncology

## 2018-11-12 MED FILL — KISQALI 200 MG DAILY DOSE: 200 | 28 days supply | Qty: 21 | Fill #0

## 2018-11-12 NOTE — Telephone Encounter (Signed)
Oral Oncology Patient Advocate Encounter  Confirmed with [facility] that Thelma Comp was shipped on 11/12/18 with a $0 copay using PANF grant.    [place] Patient [place] Phone [phone] Fax [phone] 11/12/2018   1:32 PM

## 2018-11-15 ENCOUNTER — Other Ambulatory Visit: Payer: Self-pay | Admitting: Oncology

## 2018-11-15 NOTE — Progress Notes (Signed)
Discussed genomic requests w Dr Maury Dus of [facility] [employer]; he notes the caris request does not list specific items to be tested or what use results will be put to. I am adding that information to this note (and to the patient's chart)    85 y.o. BRCA negative [place] woman:  (1) Status post left lumpectomy in 1996 for a T1b N0 grade 2 invasive ductal carcinoma which was strongly estrogen and progesterone receptor positive, treated with radiation, then anastrozole, then Evista, all treatment discontinued July 2012  METASTATIC DISEASE: July 2012 (2) recurrence to the lining of the left lung and liver documented by liver biopsy July 2012, showing metastatic adenocarcinoma estrogen and progesterone receptor positive at 96% and 97% respectively; there was no Her-2 amplification.              (a) left axillary lymph node biopsy 09/27/2016 shows breast cancer, 95% estrogen repeat receptor positive with strong staining intensity, progesterone and HER-2 negative.  (3)  She has been on exemestane since early July of 2012, with chest CT scan April 2013 showing complete resolution of her lung and liver metastases             (a) bone density in June of 08/09/2011 was normal             (b) bone density 03/20/2016 was normal with a T score of -0.6             (c) exemestane continued at the time of disease progression noted May 2018             (d) exemestane discontinued April 2019  (4) MRI of the lumbar spine 09-2012 showed significant degenerative disease but no evidence of metastatic disease to bone.  (5) disease progression noted May 2018: CT scan shows liver, lung, and bone lesions  (6) fulvestrant started 09/26/2016, palbociclib added 10/24/2016 at 125 mg daily, 21 days on. 7 days off             (a) palbociclib dose decreased to 126m daily on 02/14/2017             (b) PET scan 10/16/2018 shows progression in the liver no new sites of disease             (c) fulvestrant  discontinued; letrozole substituted             (d) palbociclib discontinued, ribociclib substituted             (e) consider consult to interventional radiology for Yttrium treatment of liver lesion  (7) denosumab/Xgeva started 11/21/2016, repeated every 28 days until 11/19/2017, then every 8 weeks thereafter             (a) Xgeva changed to every 12 weeks beginning with the 05/06/2018 dose  (8) Caris requested 10/21/2018, from 09/27/2016 lymph node biopsy, to include: MSI, Mismatch Repair Status, tumor mutational burden and PD-L1-- which may indicate sensitivity to checkpoint inhibitors BRCA 1 and 2, which if positive indicate sensitivity to PARP inhibitors ERBB2--possible use of anti-HER2 agents Estrogen Progesterone and androgen receptor status: indicating possible sensitivity to hormonal therapy PIK3 indicating possible sensitivity to PIK3 inhibitors (alpelisib)

## 2018-11-18 ENCOUNTER — Telehealth: Payer: Self-pay | Admitting: *Deleted

## 2018-11-18 NOTE — Telephone Encounter (Signed)
This RN received a VM from Debbie with [employer] stating MD information received on Friday 6/26 for review for prior authorization for [facility] with additional request for CPT codes for test requested . This RN sent below information to Debbie for obtaining of requested information  Fax number given as [phone].  Per message below needed prior to 4 pm today or prior authorization will be closed.   Bianka Cataldo [birth year]b 03/16/1924  Reference number [id number]   In regard to need for CPT codes for this patient for the following requested test:   Caris requested 10/21/2018, from 09/27/2016 lymph node biopsy, to include: MSI, Mismatch Repair Status, tumor mutational burden and PD-L1-- which may indicate sensitivity to checkpoint inhibitors BRCA 1 and 2, which if positive indicate sensitivity to PARP inhibitors ERBB2--possible use of anti-HER2 agents Estrogen Progesterone and androgen receptor status: indicating possible sensitivity to hormonal therapy PIK3 indicating possible sensitivity to PIK3 inhibitors  The doctor or nurse does not have access to this information- it would be best to contact [facility] directly for the correct CTP codes.  Below is the contact information ;   Katrina McDuffie  Account Specialist III [employer]  [address] [place] [address] Direct: [phone]  Customer Support: [phone]  Fax: [phone] Email: [email or web address]  [email or web address] Web: [email or web address]  Twitter: [email or web address]  Facebook: [email or web address]  Thank you -   Val Dodd RN 

## 2018-11-20 ENCOUNTER — Encounter: Payer: Self-pay | Admitting: Oncology

## 2018-11-22 DIAGNOSIS — Z170 Estrogen receptor positive status [ER+]: Secondary | ICD-10-CM | POA: Diagnosis not present

## 2018-11-22 DIAGNOSIS — C773 Secondary and unspecified malignant neoplasm of axilla and upper limb lymph nodes: Secondary | ICD-10-CM | POA: Diagnosis not present

## 2018-11-22 DIAGNOSIS — C787 Secondary malignant neoplasm of liver and intrahepatic bile duct: Secondary | ICD-10-CM | POA: Diagnosis not present

## 2018-11-22 DIAGNOSIS — Z803 Family history of malignant neoplasm of breast: Secondary | ICD-10-CM | POA: Diagnosis not present

## 2018-11-22 DIAGNOSIS — C50812 Malignant neoplasm of overlapping sites of left female breast: Secondary | ICD-10-CM | POA: Diagnosis not present

## 2018-11-26 DIAGNOSIS — R32 Unspecified urinary incontinence: Secondary | ICD-10-CM | POA: Diagnosis not present

## 2018-11-26 DIAGNOSIS — T887XXA Unspecified adverse effect of drug or medicament, initial encounter: Secondary | ICD-10-CM | POA: Diagnosis not present

## 2018-11-26 DIAGNOSIS — I10 Essential (primary) hypertension: Secondary | ICD-10-CM | POA: Diagnosis not present

## 2018-11-26 DIAGNOSIS — C50912 Malignant neoplasm of unspecified site of left female breast: Secondary | ICD-10-CM | POA: Diagnosis not present

## 2018-11-26 DIAGNOSIS — H9190 Unspecified hearing loss, unspecified ear: Secondary | ICD-10-CM | POA: Diagnosis not present

## 2018-11-26 DIAGNOSIS — Z0001 Encounter for general adult medical examination with abnormal findings: Secondary | ICD-10-CM | POA: Diagnosis not present

## 2018-11-26 DIAGNOSIS — J45991 Cough variant asthma: Secondary | ICD-10-CM | POA: Diagnosis not present

## 2018-11-26 DIAGNOSIS — Z1389 Encounter for screening for other disorder: Secondary | ICD-10-CM | POA: Diagnosis not present

## 2018-11-26 DIAGNOSIS — E039 Hypothyroidism, unspecified: Secondary | ICD-10-CM | POA: Diagnosis not present

## 2018-11-26 DIAGNOSIS — E782 Mixed hyperlipidemia: Secondary | ICD-10-CM | POA: Diagnosis not present

## 2018-11-26 DIAGNOSIS — F4321 Adjustment disorder with depressed mood: Secondary | ICD-10-CM | POA: Diagnosis not present

## 2018-11-27 DIAGNOSIS — H6123 Impacted cerumen, bilateral: Secondary | ICD-10-CM | POA: Diagnosis not present

## 2018-11-29 ENCOUNTER — Encounter: Payer: Self-pay | Admitting: Oncology

## 2018-12-12 ENCOUNTER — Encounter: Payer: Self-pay | Admitting: Oncology

## 2018-12-12 MED FILL — KISQALI 200 MG DAILY DOSE: 200 | 28 days supply | Qty: 21 | Fill #1

## 2019-01-06 ENCOUNTER — Telehealth: Payer: Self-pay | Admitting: Pharmacist

## 2019-01-06 NOTE — Telephone Encounter (Signed)
Oral Chemotherapy Pharmacist Encounter   Spoke with patient's daughter, [name], today to follow up regarding patient's oral chemotherapy medication: Kisqali (ribociclib) for the treatment of metastatic, hormone receptor positive breast cancer in conjunction with Femara (letrozole), planned duration until disease progression or unacceptable toxicity.  Original Start date of oral chemotherapy: 11/20/2018  [name] reports 0 tablets/doses of Kisqali 200 mg tablets, 1 tablet by mouth once daily taken with or without food, for 3 weeks on, 1 week off, repeated every 4 weeks, missed in the last month.  Patient takes her Kisqali with dinner each evening. She will start her 3rd cycle on 01/15/19  Pt reports the following side effects: increasing fatigue. She naps more. No other side effects to report. [name] does endorse an increase in coughing for her mom, but denies shortness of breath. She will alert the office if this progresses.  Last labs in May 2020, this was the last office visit with Dr. Jana Hakim. [name] states Dr. Jana Hakim had informed them that he would want to repeat imaging after 3 cycles of Kisqali. No future appointments at [facility] have yet been made.  All questions answered. [name] informed the [employer] has them scheduled for a refill call on 01/07/19 to schedule next fill of Kisqali. I also provided [name] with the phone number to rhe specialty pharmacy department at [facility]. They know to call the office with questions or concerns.  Johny Drilling, PharmD, BCPS, BCOP  01/06/2019 11:00 AM Oral Oncology Clinic [phone]

## 2019-01-08 DIAGNOSIS — H938X3 Other specified disorders of ear, bilateral: Secondary | ICD-10-CM | POA: Diagnosis not present

## 2019-01-09 MED FILL — KISQALI 200 MG DAILY DOSE: 200 | 28 days supply | Qty: 21 | Fill #2

## 2019-01-15 DIAGNOSIS — N390 Urinary tract infection, site not specified: Secondary | ICD-10-CM | POA: Diagnosis not present

## 2019-02-03 ENCOUNTER — Encounter: Payer: Self-pay | Admitting: Oncology

## 2019-02-07 ENCOUNTER — Telehealth: Payer: Self-pay | Admitting: Oncology

## 2019-02-07 ENCOUNTER — Other Ambulatory Visit: Payer: Self-pay | Admitting: Oncology

## 2019-02-07 NOTE — Telephone Encounter (Signed)
Scheduled appt per 9/18 sch message- pt daughter is aware of appt date and time

## 2019-02-10 MED FILL — KISQALI 200 MG DAILY DOSE: 200 | 28 days supply | Qty: 21 | Fill #3

## 2019-02-13 ENCOUNTER — Telehealth: Payer: Self-pay | Admitting: Oncology

## 2019-02-13 ENCOUNTER — Encounter: Payer: Self-pay | Admitting: Oncology

## 2019-02-13 NOTE — Telephone Encounter (Signed)
Returned patient's phone call regarding cancelling 09/25 appointment, per patient's request appointment has been cancelled. Patient will call back when ready to reschedule.  Message to provider.

## 2019-02-14 ENCOUNTER — Inpatient Hospital Stay: Payer: PPO | Admitting: Oncology

## 2019-02-14 ENCOUNTER — Inpatient Hospital Stay: Payer: PPO

## 2019-02-14 ENCOUNTER — Telehealth: Payer: Self-pay | Admitting: Oncology

## 2019-02-14 NOTE — Telephone Encounter (Signed)
Returned patient's phone call regarding rescheduling 09/25 cancelled appointment, per patient's request appointment has moved to 10/08.

## 2019-02-26 DIAGNOSIS — H6123 Impacted cerumen, bilateral: Secondary | ICD-10-CM | POA: Diagnosis not present

## 2019-02-27 ENCOUNTER — Inpatient Hospital Stay: Payer: PPO | Attending: Adult Health

## 2019-02-27 ENCOUNTER — Encounter: Payer: Self-pay | Admitting: Adult Health

## 2019-02-27 ENCOUNTER — Other Ambulatory Visit: Payer: Self-pay

## 2019-02-27 ENCOUNTER — Inpatient Hospital Stay (HOSPITAL_BASED_OUTPATIENT_CLINIC_OR_DEPARTMENT_OTHER): Payer: PPO | Admitting: Adult Health

## 2019-02-27 VITALS — BP 141/72 | HR 66 | Temp 98.9°F | Resp 18 | Wt 164.2 lb

## 2019-02-27 DIAGNOSIS — R413 Other amnesia: Secondary | ICD-10-CM | POA: Insufficient documentation

## 2019-02-27 DIAGNOSIS — R251 Tremor, unspecified: Secondary | ICD-10-CM | POA: Diagnosis not present

## 2019-02-27 DIAGNOSIS — Z7982 Long term (current) use of aspirin: Secondary | ICD-10-CM | POA: Insufficient documentation

## 2019-02-27 DIAGNOSIS — K219 Gastro-esophageal reflux disease without esophagitis: Secondary | ICD-10-CM | POA: Insufficient documentation

## 2019-02-27 DIAGNOSIS — C50912 Malignant neoplasm of unspecified site of left female breast: Secondary | ICD-10-CM

## 2019-02-27 DIAGNOSIS — C787 Secondary malignant neoplasm of liver and intrahepatic bile duct: Secondary | ICD-10-CM | POA: Diagnosis not present

## 2019-02-27 DIAGNOSIS — Z23 Encounter for immunization: Secondary | ICD-10-CM | POA: Insufficient documentation

## 2019-02-27 DIAGNOSIS — C50919 Malignant neoplasm of unspecified site of unspecified female breast: Secondary | ICD-10-CM | POA: Diagnosis not present

## 2019-02-27 DIAGNOSIS — F329 Major depressive disorder, single episode, unspecified: Secondary | ICD-10-CM | POA: Insufficient documentation

## 2019-02-27 DIAGNOSIS — R0602 Shortness of breath: Secondary | ICD-10-CM | POA: Diagnosis not present

## 2019-02-27 DIAGNOSIS — Z170 Estrogen receptor positive status [ER+]: Secondary | ICD-10-CM | POA: Diagnosis not present

## 2019-02-27 DIAGNOSIS — Z87891 Personal history of nicotine dependence: Secondary | ICD-10-CM | POA: Insufficient documentation

## 2019-02-27 DIAGNOSIS — R2689 Other abnormalities of gait and mobility: Secondary | ICD-10-CM | POA: Diagnosis not present

## 2019-02-27 DIAGNOSIS — C50812 Malignant neoplasm of overlapping sites of left female breast: Secondary | ICD-10-CM

## 2019-02-27 DIAGNOSIS — C7802 Secondary malignant neoplasm of left lung: Secondary | ICD-10-CM | POA: Diagnosis not present

## 2019-02-27 DIAGNOSIS — Z79811 Long term (current) use of aromatase inhibitors: Secondary | ICD-10-CM | POA: Insufficient documentation

## 2019-02-27 DIAGNOSIS — I10 Essential (primary) hypertension: Secondary | ICD-10-CM | POA: Insufficient documentation

## 2019-02-27 DIAGNOSIS — C50922 Malignant neoplasm of unspecified site of left male breast: Secondary | ICD-10-CM

## 2019-02-27 DIAGNOSIS — Z79899 Other long term (current) drug therapy: Secondary | ICD-10-CM | POA: Insufficient documentation

## 2019-02-27 DIAGNOSIS — R531 Weakness: Secondary | ICD-10-CM | POA: Insufficient documentation

## 2019-02-27 DIAGNOSIS — K589 Irritable bowel syndrome without diarrhea: Secondary | ICD-10-CM | POA: Insufficient documentation

## 2019-02-27 DIAGNOSIS — Z803 Family history of malignant neoplasm of breast: Secondary | ICD-10-CM | POA: Diagnosis not present

## 2019-02-27 DIAGNOSIS — E7800 Pure hypercholesterolemia, unspecified: Secondary | ICD-10-CM | POA: Diagnosis not present

## 2019-02-27 DIAGNOSIS — J410 Simple chronic bronchitis: Secondary | ICD-10-CM

## 2019-02-27 DIAGNOSIS — C50012 Malignant neoplasm of nipple and areola, left female breast: Secondary | ICD-10-CM | POA: Diagnosis not present

## 2019-02-27 LAB — CMP (CANCER CENTER ONLY)
ALT: 19 U/L (ref 0–44)
AST: 21 U/L (ref 15–41)
Albumin: 4.1 g/dL (ref 3.5–5.0)
Alkaline Phosphatase: 47 U/L (ref 38–126)
Anion gap: 9 (ref 5–15)
BUN: 26 mg/dL — ABNORMAL HIGH (ref 8–23)
CO2: 27 mmol/L (ref 22–32)
Calcium: 10.4 mg/dL — ABNORMAL HIGH (ref 8.9–10.3)
Chloride: 101 mmol/L (ref 98–111)
Creatinine: 1.32 mg/dL — ABNORMAL HIGH (ref 0.44–1.00)
GFR, Est AFR Am: 40 mL/min — ABNORMAL LOW (ref >60)
GFR, Estimated: 35 mL/min — ABNORMAL LOW (ref >60)
Glucose, Bld: 94 mg/dL (ref 70–99)
Potassium: 4.7 mmol/L (ref 3.5–5.1)
Sodium: 137 mmol/L (ref 135–145)
Total Bilirubin: 1 mg/dL (ref 0.3–1.2)
Total Protein: 6.9 g/dL (ref 6.5–8.1)

## 2019-02-27 LAB — CBC WITH DIFFERENTIAL (CANCER CENTER ONLY)
Abs Immature Granulocytes: 0.02 10*3/uL (ref 0.00–0.07)
Basophils Absolute: 0.1 10*3/uL (ref 0.0–0.1)
Basophils Relative: 1 %
Eosinophils Absolute: 0.1 10*3/uL (ref 0.0–0.5)
Eosinophils Relative: 2 %
HCT: 32.7 % — ABNORMAL LOW (ref 36.0–46.0)
Hemoglobin: 11 g/dL — ABNORMAL LOW (ref 12.0–15.0)
Immature Granulocytes: 0 %
Lymphocytes Relative: 20 %
Lymphs Abs: 1.2 10*3/uL (ref 0.7–4.0)
MCH: 32.1 pg (ref 26.0–34.0)
MCHC: 33.6 g/dL (ref 30.0–36.0)
MCV: 95.3 fL (ref 80.0–100.0)
Monocytes Absolute: 0.3 10*3/uL (ref 0.1–1.0)
Monocytes Relative: 6 %
Neutro Abs: 4.1 10*3/uL (ref 1.7–7.7)
Neutrophils Relative %: 71 %
Platelet Count: 232 10*3/uL (ref 150–400)
RBC: 3.43 MIL/uL — ABNORMAL LOW (ref 3.87–5.11)
RDW: 13 % (ref 11.5–15.5)
WBC Count: 5.9 10*3/uL (ref 4.0–10.5)
nRBC: 0 % (ref 0.0–0.2)

## 2019-02-27 MED ORDER — SERTRALINE HCL 50 MG PO TABS: 50.0000 mg | ORAL_TABLET | Freq: Every day | ORAL | 3 refills | Status: DC

## 2019-02-27 MED ORDER — INFLUENZA VAC A&B SA ADJ QUAD 0.5 ML IM PRSY
PREFILLED_SYRINGE | INTRAMUSCULAR | Status: AC
  Filled 2019-02-27: qty 0.5

## 2019-02-27 MED ORDER — DENOSUMAB 120 MG/1.7ML ~~LOC~~ SOLN
SUBCUTANEOUS | Status: AC
  Filled 2019-02-27: qty 1.7

## 2019-02-27 MED ORDER — INFLUENZA VAC A&B SA ADJ QUAD 0.5 ML IM PRSY
0.5000 mL | PREFILLED_SYRINGE | Freq: Once | INTRAMUSCULAR | Status: AC
  Administered 2019-02-27: 13:00:00 0.5 mL via INTRAMUSCULAR

## 2019-02-27 MED ORDER — DENOSUMAB 120 MG/1.7ML ~~LOC~~ SOLN
120.0000 mg | Freq: Once | SUBCUTANEOUS | Status: AC
  Administered 2019-02-27: 13:00:00 120 mg via SUBCUTANEOUS

## 2019-02-27 NOTE — Patient Instructions (Addendum)
Sertraline tablets What is this medicine? SERTRALINE (SER tra leen) is used to treat depression. It may also be used to treat obsessive compulsive disorder, panic disorder, post-trauma stress, premenstrual dysphoric disorder (PMDD) or social anxiety. This medicine may be used for other purposes; ask your health care provider or pharmacist if you have questions. COMMON BRAND NAME(S): Zoloft What should I tell my health care provider before I take this medicine? They need to know if you have any of these conditions:  bleeding disorders  bipolar disorder or a family history of bipolar disorder  glaucoma  heart disease  high blood pressure  history of irregular heartbeat  history of low levels of calcium, magnesium, or potassium in the blood  if you often drink alcohol  liver disease  receiving electroconvulsive therapy  seizures  suicidal thoughts, plans, or attempt; a previous suicide attempt by you or a family member  take medicines that treat or prevent blood clots  thyroid disease  an unusual or allergic reaction to sertraline, other medicines, foods, dyes, or preservatives  pregnant or trying to get pregnant  breast-feeding How should I use this medicine? Take this medicine by mouth with a glass of water. Follow the directions on the prescription label. You can take it with or without food. Take your medicine at regular intervals. Do not take your medicine more often than directed. Do not stop taking this medicine suddenly except upon the advice of your doctor. Stopping this medicine too quickly may cause serious side effects or your condition may worsen. A special MedGuide will be given to you by the pharmacist with each prescription and refill. Be sure to read this information carefully each time. Talk to your pediatrician regarding the use of this medicine in children. While this drug may be prescribed for children as young as 7 years for selected conditions,  precautions do apply. Overdosage: If you think you have taken too much of this medicine contact a poison control center or emergency room at once. NOTE: This medicine is only for you. Do not share this medicine with others. What if I miss a dose? If you miss a dose, take it as soon as you can. If it is almost time for your next dose, take only that dose. Do not take double or extra doses. What may interact with this medicine? Do not take this medicine with any of the following medications:  cisapride  dronedarone  linezolid  MAOIs like Carbex, Eldepryl, Marplan, Nardil, and Parnate  methylene blue (injected into a vein)  pimozide  thioridazine This medicine may also interact with the following medications:  alcohol  amphetamines  aspirin and aspirin-like medicines  certain medicines for depression, anxiety, or psychotic disturbances  certain medicines for fungal infections like ketoconazole, fluconazole, posaconazole, and itraconazole  certain medicines for irregular heart beat like flecainide, quinidine, propafenone  certain medicines for migraine headaches like almotriptan, eletriptan, frovatriptan, naratriptan, rizatriptan, sumatriptan, zolmitriptan  certain medicines for sleep  certain medicines for seizures like carbamazepine, valproic acid, phenytoin  certain medicines that treat or prevent blood clots like warfarin, enoxaparin, dalteparin  cimetidine  digoxin  diuretics  fentanyl  isoniazid  lithium  NSAIDs, medicines for pain and inflammation, like ibuprofen or naproxen  other medicines that prolong the QT interval (cause an abnormal heart rhythm) like dofetilide  rasagiline  safinamide  supplements like St. John's wort, kava kava, valerian  tolbutamide  tramadol  tryptophan This list may not describe all possible interactions. Give your health care provider  a list of all the medicines, herbs, non-prescription drugs, or dietary supplements  you use. Also tell them if you smoke, drink alcohol, or use illegal drugs. Some items may interact with your medicine. What should I watch for while using this medicine? Tell your doctor if your symptoms do not get better or if they get worse. Visit your doctor or health care professional for regular checks on your progress. Because it may take several weeks to see the full effects of this medicine, it is important to continue your treatment as prescribed by your doctor. Patients and their families should watch out for new or worsening thoughts of suicide or depression. Also watch out for sudden changes in feelings such as feeling anxious, agitated, panicky, irritable, hostile, aggressive, impulsive, severely restless, overly excited and hyperactive, or not being able to sleep. If this happens, especially at the beginning of treatment or after a change in dose, call your health care professional. Dennis Bast may get drowsy or dizzy. Do not drive, use machinery, or do anything that needs mental alertness until you know how this medicine affects you. Do not stand or sit up quickly, especially if you are an older patient. This reduces the risk of dizzy or fainting spells. Alcohol may interfere with the effect of this medicine. Avoid alcoholic drinks. Your mouth may get dry. Chewing sugarless gum or sucking hard candy, and drinking plenty of water may help. Contact your doctor if the problem does not go away or is severe. What side effects may I notice from receiving this medicine? Side effects that you should report to your doctor or health care professional as soon as possible:  allergic reactions like skin rash, itching or hives, swelling of the face, lips, or tongue  anxious  black, tarry stools  changes in vision  confusion  elevated mood, decreased need for sleep, racing thoughts, impulsive behavior  eye pain  fast, irregular heartbeat  feeling faint or lightheaded, falls  feeling agitated,  angry, or irritable  hallucination, loss of contact with reality  loss of balance or coordination  loss of memory  painful or prolonged erections  restlessness, pacing, inability to keep still  seizures  stiff muscles  suicidal thoughts or other mood changes  trouble sleeping  unusual bleeding or bruising  unusually weak or tired  vomiting Side effects that usually do not require medical attention (report to your doctor or health care professional if they continue or are bothersome):  change in appetite or weight  change in sex drive or performance  diarrhea  increased sweating  indigestion, nausea  tremors This list may not describe all possible side effects. Call your doctor for medical advice about side effects. You may report side effects to FDA at 1-800-FDA-1088. Where should I keep my medicine? Keep out of the reach of children. Store at room temperature between 15 and 30 degrees C (59 and 86 degrees F). Throw away any unused medicine after the expiration date. NOTE: This sheet is a summary. It may not cover all possible information. If you have questions about this medicine, talk to your doctor, pharmacist, or health care provider.  2020 Elsevier/Gold Standard (2018-04-30 10:09:27)   Denosumab injection What is this medicine? DENOSUMAB (den oh sue mab) slows bone breakdown. Prolia is used to treat osteoporosis in women after menopause and in men, and in people who are taking corticosteroids for 6 months or more. Delton See is used to treat a high calcium level due to cancer and to prevent bone  fractures and other bone problems caused by multiple myeloma or cancer bone metastases. Delton See is also used to treat giant cell tumor of the bone. This medicine may be used for other purposes; ask your health care provider or pharmacist if you have questions. COMMON BRAND NAME(S): Prolia, XGEVA What should I tell my health care provider before I take this medicine? They  need to know if you have any of these conditions:  dental disease  having surgery or tooth extraction  infection  kidney disease  low levels of calcium or Vitamin D in the blood  malnutrition  on hemodialysis  skin conditions or sensitivity  thyroid or parathyroid disease  an unusual reaction to denosumab, other medicines, foods, dyes, or preservatives  pregnant or trying to get pregnant  breast-feeding How should I use this medicine? This medicine is for injection under the skin. It is given by a health care professional in a hospital or clinic setting. A special MedGuide will be given to you before each treatment. Be sure to read this information carefully each time. For Prolia, talk to your pediatrician regarding the use of this medicine in children. Special care may be needed. For Delton See, talk to your pediatrician regarding the use of this medicine in children. While this drug may be prescribed for children as young as 13 years for selected conditions, precautions do apply. Overdosage: If you think you have taken too much of this medicine contact a poison control center or emergency room at once. NOTE: This medicine is only for you. Do not share this medicine with others. What if I miss a dose? It is important not to miss your dose. Call your doctor or health care professional if you are unable to keep an appointment. What may interact with this medicine? Do not take this medicine with any of the following medications:  other medicines containing denosumab This medicine may also interact with the following medications:  medicines that lower your chance of fighting infection  steroid medicines like prednisone or cortisone This list may not describe all possible interactions. Give your health care provider a list of all the medicines, herbs, non-prescription drugs, or dietary supplements you use. Also tell them if you smoke, drink alcohol, or use illegal drugs. Some items  may interact with your medicine. What should I watch for while using this medicine? Visit your doctor or health care professional for regular checks on your progress. Your doctor or health care professional may order blood tests and other tests to see how you are doing. Call your doctor or health care professional for advice if you get a fever, chills or sore throat, or other symptoms of a cold or flu. Do not treat yourself. This drug may decrease your body's ability to fight infection. Try to avoid being around people who are sick. You should make sure you get enough calcium and vitamin D while you are taking this medicine, unless your doctor tells you not to. Discuss the foods you eat and the vitamins you take with your health care professional. See your dentist regularly. Brush and floss your teeth as directed. Before you have any dental work done, tell your dentist you are receiving this medicine. Do not become pregnant while taking this medicine or for 5 months after stopping it. Talk with your doctor or health care professional about your birth control options while taking this medicine. Women should inform their doctor if they wish to become pregnant or think they might be pregnant. There is  a potential for serious side effects to an unborn child. Talk to your health care professional or pharmacist for more information. What side effects may I notice from receiving this medicine? Side effects that you should report to your doctor or health care professional as soon as possible:  allergic reactions like skin rash, itching or hives, swelling of the face, lips, or tongue  bone pain  breathing problems  dizziness  jaw pain, especially after dental work  redness, blistering, peeling of the skin  signs and symptoms of infection like fever or chills; cough; sore throat; pain or trouble passing urine  signs of low calcium like fast heartbeat, muscle cramps or muscle pain; pain, tingling,  numbness in the hands or feet; seizures  unusual bleeding or bruising  unusually weak or tired Side effects that usually do not require medical attention (report to your doctor or health care professional if they continue or are bothersome):  constipation  diarrhea  headache  joint pain  loss of appetite  muscle pain  runny nose  tiredness  upset stomach This list may not describe all possible side effects. Call your doctor for medical advice about side effects. You may report side effects to FDA at 1-800-FDA-1088. Where should I keep my medicine? This medicine is only given in a clinic, doctor's office, or other health care setting and will not be stored at home. NOTE: This sheet is a summary. It may not cover all possible information. If you have questions about this medicine, talk to your doctor, pharmacist, or health care provider.  2020 Elsevier/Gold Standard (2017-09-14 16:10:44)

## 2019-02-27 NOTE — Progress Notes (Signed)
ID: Theresa Wallace   DOB: Jun 07, 1939  MR#: [id number]  IZT#:[id number]   Patient Care Team: [name], MD as PCP - General (Internal Medicine) Magrinat, Virgie Dad, MD as Consulting Physician (Oncology) Jolene Provost, PA-C as Consulting Physician (Otolaryngology) OTHER MD:  CHIEF COMPLAINT:  Metastatic Breast Cancer, estrogen receptor positive  CURRENT TREATMENT: letrozole, ribociclib, denosumab/ Delton See   INTERVAL HISTORY: Theresa Wallace was contacted by phone today for follow-up and treatment of her estrogen receptor positive metastatic breast cancer.   Due to slight progression in the liver metastases, she was changed from Fulvestrant and palbociclib to Letrozole and Ribociclib.  She is taking the letrozole daily.  The ribociclib she takes at 2104m daily 3 weeks on and 1 week off.  Finally, the patient also continues on denosumab/Xgeva, which she receives every 12 weeks.  She last received Xgeva in 07/2018.  REVIEW OF SYSTEMS: RIndyahas had a change in her mental status.  [name] becoming more forgetful, not realizing why she went into a room and what she was getting out of the room. Last night she got upset and was crying when she was having difficulty with her phone.  She even talked about suicide last night.  She cried for hours prior to going to sleep. She also gets very upset when her body doesn't do what she wants it to do.  She and her daughter do not want a work up for this with neuro psychiatry.  She also had her female friend who is very demented and is no longer calling her.  She misses him tremendously.   She has no cough, no shortness of breath, no chest pain, no palpitations.  She is without fever or chills, bowel/bladder changes, appetite decrease.  A detailed ROS was otherwise non contributory.    HISTORY OF PRESENT ILLNESS: From the prior summary:  Theresa Flahartyis an 85y.o.  [place] woman I saw briefly when she moved to this area in 2007.  [facility] [place] [phone]  It was grade 2, measured 1 cm maximally and was strongly estrogen and progesterone-receptor positive.  HER-2 was not checked.  This was T1b NX invasive ductal carcinoma, stage I, and she was treated after radiation with anastrozole for several years, eventually switching to Evista primarily for cost reasons.   More recently, she presented to Dr. GInda Merlinwith fatigue and a dry cough.  He auscultated dullness to percussion at the left base and obtained a chest x-ray which confirmed the presence of a left pleural effusion.   Dr. GInda Merlinscheduled RJoseph Artfor an ultrasound-guided left thoracentesis, and this was performed November 09, 2010.  Approximately 1 L of amber-colored fluid was removed.  Cytology from this procedure (([id number] showed only reactive mesothelial cells.   The patient was then referred here and set up for a CT of the chest and a PET scan.  The CT of the chest on June 20th showed 2 slightly enlarged left axillary lymph nodes with some irregular contours but more importantly multiple pleural nodules in the left chest measuring up to 4.6 cm.  The right chest was normal.  There was no pericardial effusion.  There was only a small to moderate left pleural effusion remaining, and aside from left lower lobe collapse or consolidation, there was no evidence of lung parenchymal involvement.  There was no evidence of bone involvement and also no calcified pleural plaques and no worrisome  bone involvement.  PET scan on June 27th showed the nodularity along the left pleura to be intensely hypermetabolic.  For example the large lobe nodule measuring 4.5 cm had an SUV max of 12.9.  There was no hypermetabolic mediastinal nodal activity.  The to left axillary lymph nodes were very mildly hypermetabolic.  The left breast was clear.  There was 1 hypermetabolic lesion in the liver measuring 3.2 cm.  On June 27th, Dr. Bertrand performed  fine-needle aspiration of a 7 mm abnormal-appearing lymph node in the lower left axilla.  The cytology from this procedure, however, ([name]) also was inconclusive showing no evidence of nodal tissue, mostly blood and fatty tissue.   Her subsequent history is as detailed below   PAST MEDICAL HISTORY: Past Medical History:  Diagnosis Date  . Breast cancer metastasized to multiple sites (HCC) 06/07/2011  . Cancer (HCC)    breast ca  . GERD (gastroesophageal reflux disease)   . Hypercholesterolemia   . Hypertension   . Irritable bowel syndrome (IBS)   . Onychomycosis   . Sigmoid diverticulosis   Benign tremor. History of palpitations with Holter monitor in 2008 showing PACs.   PAST SURGICAL HISTORY: Past Surgical History:  Procedure Laterality Date  . ABDOMINAL HYSTERECTOMY    . BREAST LUMPECTOMY    . CATARACT EXTRACTION    . CHOLECYSTECTOMY    . KNEE ARTHROSCOPY      FAMILY HISTORY Family History  Problem Relation Age of Onset  . Breast cancer Sister 70  . Breast cancer Cousin        dx in her 60s  . Bone cancer Mother   [name] died in an automobile accident.  [name] died with "bone cancer."  The patient had 3 sisters.  One had breast cancer develop in her 70s.  There is also a cousin with breast cancer but no history of ovarian cancer or other history of cancer in first-degree relatives.   GYNECOLOGIC HISTORY: No LMP recorded. Patient has had a hysterectomy. She is GX, P4, first pregnancy to term at age 24.  She was on the hormone replacement after her hysterectomy but only briefly and stopped when she had the initial diagnosis of breast cancer in 1996. She underwent hysterectomy without BSO.   SOCIAL HISTORY: (Updated May 2018) She worked as a secretary.  She has been widowed since 2005, her husband Theresa Wallace dying after a long illness involving strokes and a myocardial infarction.  Her significant other Theresa Wallace who is a retired engineer  is now suffering from dementia and is in a skilled nursing facility. The patient lives with her daughter Theresa Wallace who used to be a secretary for Dr. Gates, and Theresa Wallace's husband, Brian Petrarca, who is a physician's assistant with Dr. Whitfield.  The patient has 12 grandchildren and 5 great-grandchildren. Incidentally Theresa has a son in [place].     ADVANCED DIRECTIVES: In place   HEALTH MAINTENANCE: (Updated 02/26/2013) Social History   Tobacco Use  . Smoking status: Former Smoker    Quit date: 05/22/1968    Years since quitting: 50.8  . Smokeless tobacco: Never Used  Substance Use Topics  . Alcohol use: Yes    Comment: 2 yearly  . Drug use: No     Colonoscopy:  PAP: s/p remote hysterectomy  Bone density:  10/23/2011, normal  Lipid panel: Dr. Gates, UTD   Allergies  Allergen Reactions  . Cantaloupe (Diagnostic)   . Ciprofloxacin   . Epinephrine   .   Pineapple   . Tamoxifen   . Mupirocin Rash    Current Outpatient Medications  Medication Sig Dispense Refill  . aspirin 81 MG tablet Take 81 mg by mouth every Monday,Wednesday,Friday, and Sunday at 6 PM.     . Calcium Carbonate-Vitamin D (CALCIUM-VITAMIN D) 500-200 MG-UNIT per tablet Take 1 tablet by mouth 2 (two) times daily with a meal.    . cholecalciferol (VITAMIN D) 400 UNITS TABS Take 2,000 Units by mouth daily.    . esomeprazole (NEXIUM) 20 MG capsule Take 1 capsule (20 mg total) by mouth as needed.    . fluconazole (DIFLUCAN) 150 MG tablet as needed.    . gabapentin (NEURONTIN) 100 MG capsule TAKE ONE CAPSULE BY MOUTH EVERY NIGHT AT BEDTIME AND MAY INCREASE TO TWO TIMES A DAY IF NEEDED 60 capsule 2  . ketoconazole (NIZORAL) 2 % cream Apply 1 application topically daily as needed for irritation.     . ketotifen (ZADITOR) 0.025 % ophthalmic solution Place 1 drop into both eyes as needed (itchy eyes).    . letrozole (FEMARA) 2.5 MG tablet Take 1 tablet (2.5 mg total) by mouth daily. 90 tablet 4  . levothyroxine  (SYNTHROID, LEVOTHROID) 25 MCG tablet Take 25 mcg by mouth daily.    . losartan (COZAAR) 50 MG tablet 25 mg.    . metroNIDAZOLE (METROGEL) 0.75 % gel Apply topically 2 (two) times daily. For rosacea    . Multiple Vitamin (MULTIVITAMIN) tablet Take 1 tablet by mouth daily.    . propranolol ER (INDERAL LA) 60 MG 24 hr capsule     . ribociclib succ (KISQALI 200MG DAILY DOSE) 200 MG Therapy Pack Take 1 tablet (200 mg total) by mouth daily. Take for 21 days on, 7 days off, repeat every 28 days. Swallow tablets whole, do not crush, chew, or split. 21 tablet 6   No current facility-administered medications for this visit.     OBJECTIVE:  Vitals:   02/27/19 1212  BP: (!) 141/72  Pulse: 66  Resp: 18  Temp: 98.9 F (37.2 C)  SpO2: 100%   Body mass index is 27.75 kg/m.  Filed Weights   02/27/19 1212  Weight: 164 lb 3.2 oz (74.5 kg)    GENERAL: Patient is an older woman sitting in a wheelchair HEENT:  Sclerae anicteric.  Oropharynx clear and moist. No ulcerations or evidence of oropharyngeal candidiasis. Neck is supple.  NODES:  No cervical, supraclavicular, or axillary lymphadenopathy palpated.  BREAST EXAM:  Deferred. LUNGS:  Clear to auscultation bilaterally.  No wheezes or rhonchi. HEART:  Regular rate and rhythm. No murmur appreciated. ABDOMEN:  Soft, nontender.  Positive, normoactive bowel sounds. No organomegaly palpated. MSK:  No focal spinal tenderness to palpation.  EXTREMITIES:  No peripheral edema.   SKIN:  Clear with no obvious rashes or skin changes. No nail dyscrasia. NEURO:  Nonfocal. Well oriented.  Appropriate affect.     LAB RESULTS: Lab Results  Component Value Date   WBC 5.9 02/27/2019   NEUTROABS 4.1 02/27/2019   HGB 11.0 (L) 02/27/2019   HCT 32.7 (L) 02/27/2019   MCV 95.3 02/27/2019   PLT 232 02/27/2019      Chemistry      Component Value Date/Time   NA 137 02/27/2019 1153   NA 129 (L) 05/07/2017 1045   K 4.7 02/27/2019 1153   K 5.0 05/07/2017 1045    CL 101 02/27/2019 1153   CL 104 10/24/2012 1014   CO2 27 02/27/2019 1153     CO2 25 05/07/2017 1045   BUN 26 (H) 02/27/2019 1153   BUN 17.6 05/07/2017 1045   CREATININE 1.32 (H) 02/27/2019 1153   CREATININE 1.1 05/07/2017 1045      Component Value Date/Time   CALCIUM 10.4 (H) 02/27/2019 1153   CALCIUM 9.3 05/07/2017 1045   ALKPHOS 47 02/27/2019 1153   ALKPHOS 37 (L) 05/07/2017 1045   AST 21 02/27/2019 1153   AST 20 05/07/2017 1045   ALT 19 02/27/2019 1153   ALT 13 05/07/2017 1045   BILITOT 1.0 02/27/2019 1153   BILITOT 1.55 (H) 05/07/2017 1045      IMAGING STUDIES: No results found.  ASSESSMENT: 85 y.o. BRCA negative [place] woman:  (1) Status post left lumpectomy in 1996 for a T1b N0 grade 2 invasive ductal carcinoma which was strongly estrogen and progesterone receptor positive, treated with radiation, then anastrozole, then Evista, all treatment discontinued July 2012  METASTATIC DISEASE: July 2012 (2) recurrence to the lining of the left lung and liver documented by liver biopsy July 2012, showing metastatic adenocarcinoma estrogen and progesterone receptor positive at 96% and 97% respectively; there was no Her-2 amplification.   (a) left axillary lymph node biopsy 09/27/2016 shows breast cancer, 95% estrogen repeat receptor positive with strong staining intensity, progesterone and HER-2 negative.  (3)  She has been on exemestane since early July of 2012, with chest CT scan April 2013 showing complete resolution of her lung and liver metastases  (a) bone density in June of 08/09/2011 was normal  (b) bone density 03/20/2016 was normal with a T score of -0.6  (c) exemestane continued at the time of disease progression noted May 2018  (d) exemestane discontinued April 2019  (4) MRI of the lumbar spine 09-2012 showed significant degenerative disease but no evidence of metastatic disease to bone.  (5) disease progression noted May 2018: CT scan shows liver, lung, and bone  lesions  (6) fulvestrant started 09/26/2016, palbociclib added 10/24/2016 at 125 mg daily, 21 days on. 7 days off  (a) palbociclib dose decreased to 100mg daily on 02/14/2017  (b) PET scan 10/16/2018 shows progression in the liver no new sites of disease  (c) fulvestrant discontinued; letrozole substituted  (d) palbociclib discontinued, ribociclib substituted  (e) consider consult to interventional radiology for Yttrium treatment of liver lesion  (7) denosumab/Xgeva started 11/21/2016, repeated every 28 days until 11/19/2017, then every 8 weeks thereafter  (a) Xgeva changed to every 12 weeks beginning with the 05/06/2018 dose  (8) Caris requested 10/21/2018, from 09/27/2016 lymph node biopsy  PLAN:  Bennette is doing well today.  Her labs are stable and she has no clinical signs of progression.  She will continue on Letrozole daily and will continue on the Ribociclib 3 weeks on and 1 week off.  She is tolerating it moderately well.  We will repeat a PET scan at the end of November.  We discussed Mashelle's depression.  She will start Sertraline daily.  I gave her detailed information in her AVS about this.    She will receive Xgeva and a flu shot today.   She will return in 2 months for labs and f/u with Dr. Magrinat.  She was recommended to continue with the appropriate pandemic precautions. She knows to call for any questions that may arise between now and her next appointment.  We are happy to see her sooner if needed.  Lindsey Causey, NP 02/27/19 12:36 PM Medical Oncology and Hematology [facility] [address] [place],   [place] [address] Tel. [phone]    Fax. [phone]

## 2019-02-28 ENCOUNTER — Telehealth: Payer: Self-pay | Admitting: Oncology

## 2019-02-28 LAB — CANCER ANTIGEN 27.29: CA 27.29: 278.1 U/mL — ABNORMAL HIGH (ref 0.0–38.6)

## 2019-02-28 NOTE — Telephone Encounter (Signed)
I talk with patient daughter regarding schedule

## 2019-03-04 ENCOUNTER — Other Ambulatory Visit: Payer: Self-pay | Admitting: Oncology

## 2019-03-06 ENCOUNTER — Encounter: Payer: Self-pay | Admitting: Oncology

## 2019-03-06 ENCOUNTER — Other Ambulatory Visit: Payer: Self-pay

## 2019-03-06 ENCOUNTER — Inpatient Hospital Stay (HOSPITAL_BASED_OUTPATIENT_CLINIC_OR_DEPARTMENT_OTHER): Payer: PPO | Admitting: Medical

## 2019-03-06 ENCOUNTER — Telehealth: Payer: Self-pay | Admitting: *Deleted

## 2019-03-06 ENCOUNTER — Inpatient Hospital Stay: Payer: PPO

## 2019-03-06 ENCOUNTER — Other Ambulatory Visit: Payer: Self-pay | Admitting: *Deleted

## 2019-03-06 VITALS — BP 161/97 | HR 66 | Temp 98.3°F | Resp 17 | Ht 64.0 in | Wt 161.2 lb

## 2019-03-06 DIAGNOSIS — R251 Tremor, unspecified: Secondary | ICD-10-CM

## 2019-03-06 DIAGNOSIS — C50812 Malignant neoplasm of overlapping sites of left female breast: Secondary | ICD-10-CM | POA: Diagnosis not present

## 2019-03-06 DIAGNOSIS — Z170 Estrogen receptor positive status [ER+]: Secondary | ICD-10-CM | POA: Diagnosis not present

## 2019-03-06 DIAGNOSIS — F321 Major depressive disorder, single episode, moderate: Secondary | ICD-10-CM

## 2019-03-06 DIAGNOSIS — C50919 Malignant neoplasm of unspecified site of unspecified female breast: Secondary | ICD-10-CM

## 2019-03-06 LAB — CMP (CANCER CENTER ONLY)
ALT: 19 U/L (ref 0–44)
AST: 24 U/L (ref 15–41)
Albumin: 4 g/dL (ref 3.5–5.0)
Alkaline Phosphatase: 54 U/L (ref 38–126)
Anion gap: 9 (ref 5–15)
BUN: 21 mg/dL (ref 8–23)
CO2: 27 mmol/L (ref 22–32)
Calcium: 9 mg/dL (ref 8.9–10.3)
Chloride: 92 mmol/L — ABNORMAL LOW (ref 98–111)
Creatinine: 1.23 mg/dL — ABNORMAL HIGH (ref 0.44–1.00)
GFR, Est AFR Am: 44 mL/min — ABNORMAL LOW (ref >60)
GFR, Estimated: 38 mL/min — ABNORMAL LOW (ref >60)
Glucose, Bld: 107 mg/dL — ABNORMAL HIGH (ref 70–99)
Potassium: 4.3 mmol/L (ref 3.5–5.1)
Sodium: 128 mmol/L — ABNORMAL LOW (ref 135–145)
Total Bilirubin: 1.4 mg/dL — ABNORMAL HIGH (ref 0.3–1.2)
Total Protein: 6.8 g/dL (ref 6.5–8.1)

## 2019-03-06 LAB — CBC WITH DIFFERENTIAL (CANCER CENTER ONLY)
Abs Immature Granulocytes: 0.03 10*3/uL (ref 0.00–0.07)
Basophils Absolute: 0 10*3/uL (ref 0.0–0.1)
Basophils Relative: 1 %
Eosinophils Absolute: 0 10*3/uL (ref 0.0–0.5)
Eosinophils Relative: 1 %
HCT: 31.1 % — ABNORMAL LOW (ref 36.0–46.0)
Hemoglobin: 10.8 g/dL — ABNORMAL LOW (ref 12.0–15.0)
Immature Granulocytes: 1 %
Lymphocytes Relative: 14 %
Lymphs Abs: 0.7 10*3/uL (ref 0.7–4.0)
MCH: 31.2 pg (ref 26.0–34.0)
MCHC: 34.7 g/dL (ref 30.0–36.0)
MCV: 89.9 fL (ref 80.0–100.0)
Monocytes Absolute: 0.3 10*3/uL (ref 0.1–1.0)
Monocytes Relative: 7 %
Neutro Abs: 3.9 10*3/uL (ref 1.7–7.7)
Neutrophils Relative %: 76 %
Platelet Count: 177 10*3/uL (ref 150–400)
RBC: 3.46 MIL/uL — ABNORMAL LOW (ref 3.87–5.11)
RDW: 12.8 % (ref 11.5–15.5)
WBC Count: 5 10*3/uL (ref 4.0–10.5)
nRBC: 0 % (ref 0.0–0.2)

## 2019-03-06 LAB — MAGNESIUM: Magnesium: 1.7 mg/dL (ref 1.7–2.4)

## 2019-03-06 NOTE — Telephone Encounter (Signed)
Received mychart message from pt daughter stating pt is experiencing uncontrollable shaking of her hands, shortness of breath and body weakness.  Pt started taking Zoloft 10/8 and pt daughter questioning if this could be side effects of Zoloft.  Per. Dr. Jana Hakim, pt to be seen in [facility] today with lab draw to check for any electrolyte imbalances. Pt daughter verbalized understanding.  Liza RN notified and High priority message sent to scheduling.  Pt does have a history of memory impairment and daughter is able to accompany her today.

## 2019-03-06 NOTE — Progress Notes (Signed)
Pt started zoloft on 10/8 for daughter's concerns about emotional/social withdrawal observed in pt.  Hx memory impairment at baseline and HOH.  Daughter reports that since starting zoloft the patient's withdrawal/flat affect has increased, her incontinence has worsened, her gen weakness/fatigue have increased, and her hands have begun shaking uncontrollably.  States that pt is having new difficulty getting up/moving around and his shuffling.  No slurred speech or unilat facial droop noted.  Denies recent falls/injury.

## 2019-03-06 NOTE — Patient Instructions (Signed)
COVID-19: How to Protect Yourself and Others Know how it spreads  There is currently no vaccine to prevent coronavirus disease 2019 (COVID-19).  The best way to prevent illness is to avoid being exposed to this virus.  The virus is thought to spread mainly from person-to-person. ? Between people who are in close contact with one another (within about 6 feet). ? Through respiratory droplets produced when an infected person coughs, sneezes or talks. ? These droplets can land in the mouths or noses of people who are nearby or possibly be inhaled into the lungs. ? Some recent studies have suggested that COVID-19 may be spread by people who are not showing symptoms. Everyone should Clean your hands often  Wash your hands often with soap and water for at least 20 seconds especially after you have been in a public place, or after blowing your nose, coughing, or sneezing.  If soap and water are not readily available, use a hand sanitizer that contains at least 60% alcohol. Cover all surfaces of your hands and rub them together until they feel dry.  Avoid touching your eyes, nose, and mouth with unwashed hands. Avoid close contact  Stay home if you are sick.  Avoid close contact with people who are sick.  Put distance between yourself and other people. ? Remember that some people without symptoms may be able to spread virus. ? This is especially important for people who are at higher risk of getting very sick.www.cdc.gov/coronavirus/2019-ncov/need-extra-precautions/people-at-higher-risk.html Cover your mouth and nose with a cloth face cover when around others  You could spread COVID-19 to others even if you do not feel sick.  Everyone should wear a cloth face cover when they have to go out in public, for example to the grocery store or to pick up other necessities. ? Cloth face coverings should not be placed on young children under age 2, anyone who has trouble breathing, or is unconscious,  incapacitated or otherwise unable to remove the mask without assistance.  The cloth face cover is meant to protect other people in case you are infected.  Do NOT use a facemask meant for a healthcare worker.  Continue to keep about 6 feet between yourself and others. The cloth face cover is not a substitute for social distancing. Cover coughs and sneezes  If you are in a private setting and do not have on your cloth face covering, remember to always cover your mouth and nose with a tissue when you cough or sneeze or use the inside of your elbow.  Throw used tissues in the trash.  Immediately wash your hands with soap and water for at least 20 seconds. If soap and water are not readily available, clean your hands with a hand sanitizer that contains at least 60% alcohol. Clean and disinfect  Clean AND disinfect frequently touched surfaces daily. This includes tables, doorknobs, light switches, countertops, handles, desks, phones, keyboards, toilets, faucets, and sinks. www.cdc.gov/coronavirus/2019-ncov/prevent-getting-sick/disinfecting-your-home.html  If surfaces are dirty, clean them: Use detergent or soap and water prior to disinfection.  Then, use a household disinfectant. You can see a list of EPA-registered household disinfectants here. cdc.gov/coronavirus 09/24/2018 This information is not intended to replace advice given to you by your health care provider. Make sure you discuss any questions you have with your health care provider. Document Released: 09/03/2018 Document Revised: 10/02/2018 Document Reviewed: 09/03/2018 Elsevier Patient Education  2020 [employer]  

## 2019-03-09 NOTE — Progress Notes (Signed)
Symptoms Management Clinic Progress Note   Theresa Wallace [id number] 12-31-1923 85 y.o.  Theresa Wallace is managed by Dr. Jana Hakim  Actively treated with chemotherapy/immunotherapy/hormonal therapy: yes  Current therapy: Letrozole, Kisqali, and Xgeva  Next scheduled appointment with provider: 05/01/2019  Assessment: Plan:    Malignant neoplasm of overlapping sites of left breast in female, estrogen receptor positive ([place])  Tremor  Current moderate episode of major depressive disorder without prior episode [facility])   ER positive malignant neoplasm of left breast: The patient continues to be managed by Dr. Jana Hakim is currently treated with letrozole, Kisqali and Xgeva.  She is scheduled to be seen in follow-up on 05/01/2019.  Tremor with depression: The patient's tremor began after starting Zoloft 50 mg once daily.  She was told to stop Zoloft.  The patient's daughter was told that the patient could restart Zoloft 25 mg once daily for her depression.  Please see After Visit Summary for patient specific instructions.  Future Appointments  Date Time Provider [place]  04/21/2019  8:00 AM [facility] PET CT 1 [facility] [place]  05/01/2019 11:00 AM [facility] LAB 1 [facility] None  05/01/2019 11:30 AM Magrinat, Virgie Dad, MD [facility] None  05/01/2019 12:15 PM [facility] [place] FLUSH [facility] None    No orders of the defined types were placed in this encounter.      Subjective:   Patient ID:  Theresa Wallace is a 85 y.o. (DOB 08-19-1923) female.  Chief Complaint:  Chief Complaint  Patient presents with  . Tremors    HPI Theresa Wallace Is a 85 y.o. female with a diagnosis of an ER positive malignant neoplasm of left breast.  She continues to be managed by Dr. Jana Hakim is currently treated with letrozole, Kisqali and Xgeva.   She was recently begun on Zoloft 50 mg once daily for depression.  Since then she has developed a tremor greater in her left hand than right.  She is  also had increasing weakness, shortness of breath, shuffling gait, trouble getting up, decreased memory and episodes of incontinence.   The symptoms have begun since starting Zoloft.  She has had no slurred speech drooping of her eyes or mouth.  Medications: I have reviewed the patient's current medications.  Allergies:  Allergies  Allergen Reactions  . Cantaloupe (Diagnostic)   . Ciprofloxacin   . Epinephrine   . Pineapple   . Tamoxifen   . Mupirocin Rash    Past Medical History:  Diagnosis Date  . Breast cancer metastasized to multiple sites ([place]) 06/07/2011  . Cancer ([place])    breast ca  . GERD (gastroesophageal reflux disease)   . Hypercholesterolemia   . Hypertension   . Irritable bowel syndrome (IBS)   . Onychomycosis   . Sigmoid diverticulosis     Past Surgical History:  Procedure Laterality Date  . ABDOMINAL HYSTERECTOMY    . BREAST LUMPECTOMY    . CATARACT EXTRACTION    . CHOLECYSTECTOMY    . KNEE ARTHROSCOPY      Family History  Problem Relation Age of Onset  . Breast cancer Sister 43  . Breast cancer Cousin        dx in her 40s  . Bone cancer Mother     Social History   Socioeconomic History  . Marital status: Widowed    Spouse name: Not on file  . Number of children: Not on file  . Years of education: Not on file  . Highest education level: Not on file  Occupational  History  . Not on file  Social Needs  . Financial resource strain: Patient refused  . Food insecurity    Worry: Patient refused    Inability: Patient refused  . Transportation needs    Medical: Yes    Non-medical: Yes  Tobacco Use  . Smoking status: Former Smoker    Quit date: 05/22/1968    Years since quitting: 50.8  . Smokeless tobacco: Never Used  Substance and Sexual Activity  . Alcohol use: Yes    Comment: 2 yearly  . Drug use: No  . Sexual activity: Not on file  Lifestyle  . Physical activity    Days per week: Patient refused    Minutes per session: Patient refused   . Stress: Not at all  Relationships  . Social Herbalist on phone: Patient refused    Gets together: Patient refused    Attends religious service: Patient refused    Active member of club or organization: Patient refused    Attends meetings of clubs or organizations: Patient refused    Relationship status: Patient refused  . Intimate partner violence    Fear of current or ex partner: No    Emotionally abused: No    Physically abused: No    Forced sexual activity: No  Other Topics Concern  . Not on file  Social History Narrative  . Not on file    Past Medical History, Surgical history, Social history, and Family history were reviewed and updated as appropriate.   Please see review of systems for further details on the patient's review from today.   Review of Systems:  Review of Systems  Constitutional: Positive for appetite change. Negative for chills, diaphoresis and fever.  HENT: Positive for voice change. Negative for trouble swallowing.   Respiratory: Positive for shortness of breath. Negative for cough, choking, chest tightness and stridor.   Cardiovascular: Negative for chest pain and palpitations.  Gastrointestinal:       Incontinence  Genitourinary:       Incontinence  Musculoskeletal: Positive for gait problem.  Neurological: Positive for tremors and weakness.    Objective:   Physical Exam:  BP (!) 161/97 (BP Location: Left Arm, Patient Position: Sitting) Comment: retake nurse notified  Pulse 66   Temp 98.3 F (36.8 C) (Temporal)   Resp 17   Ht 5\' 4"  (1.626 m)   Wt 161 lb 3.2 oz (73.1 kg)   SpO2 97%   BMI 27.67 kg/m  ECOG: 1  Physical Exam Constitutional:      General: She is not in acute distress.    Comments: The patient is an elderly female who is hard of hearing and is seated in a wheelchair.  Her physical exam was completed from the wheelchair.  She appears to be in no acute distress.  She has hearing aids in both ears.  HENT:      Head: Normocephalic and atraumatic.     Mouth/Throat:     Mouth: Mucous membranes are moist.     Pharynx: Oropharynx is clear. No oropharyngeal exudate or posterior oropharyngeal erythema.     Comments: No facial drooping was appreciated. Eyes:     General: No scleral icterus.       Right eye: No discharge.        Left eye: No discharge.     Conjunctiva/sclera: Conjunctivae normal.  Cardiovascular:     Rate and Rhythm: Normal rate and regular rhythm.  Pulmonary:     Effort:  Pulmonary effort is normal. No respiratory distress.     Breath sounds: Normal breath sounds. No wheezing or rales.  Abdominal:     General: Abdomen is flat. Bowel sounds are normal. There is no distension.     Palpations: Abdomen is soft.     Tenderness: There is no abdominal tenderness. There is no guarding.  Musculoskeletal: Normal range of motion.     Right lower leg: No edema.     Left lower leg: No edema.  Lymphadenopathy:     Cervical: No cervical adenopathy.  Skin:    General: Skin is warm and dry.     Coloration: Skin is not pale.  Neurological:     Motor: No weakness.     Gait: Gait abnormal (Patient is ambulating with the use of a wheelchair.).     Comments: Upper extremity strength and lower extremity strength 5/5 bilaterally  Psychiatric:        Behavior: Behavior normal.     Lab Review:     Component Value Date/Time   NA 128 (L) 03/06/2019 1232   NA 129 (L) 05/07/2017 1045   K 4.3 03/06/2019 1232   K 5.0 05/07/2017 1045   CL 92 (L) 03/06/2019 1232   CL 104 10/24/2012 1014   CO2 27 03/06/2019 1232   CO2 25 05/07/2017 1045   GLUCOSE 107 (H) 03/06/2019 1232   GLUCOSE 80 05/07/2017 1045   GLUCOSE 103 (H) 10/24/2012 1014   BUN 21 03/06/2019 1232   BUN 17.6 05/07/2017 1045   CREATININE 1.23 (H) 03/06/2019 1232   CREATININE 1.1 05/07/2017 1045   CALCIUM 9.0 03/06/2019 1232   CALCIUM 9.3 05/07/2017 1045   PROT 6.8 03/06/2019 1232   PROT 6.7 05/07/2017 1045   ALBUMIN 4.0 03/06/2019  1232   ALBUMIN 4.0 05/07/2017 1045   AST 24 03/06/2019 1232   AST 20 05/07/2017 1045   ALT 19 03/06/2019 1232   ALT 13 05/07/2017 1045   ALKPHOS 54 03/06/2019 1232   ALKPHOS 37 (L) 05/07/2017 1045   BILITOT 1.4 (H) 03/06/2019 1232   BILITOT 1.55 (H) 05/07/2017 1045   GFRNONAA 38 (L) 03/06/2019 1232   GFRAA 44 (L) 03/06/2019 1232       Component Value Date/Time   WBC 5.0 03/06/2019 1232   WBC 3.3 (L) 08/27/2018 1133   RBC 3.46 (L) 03/06/2019 1232   HGB 10.8 (L) 03/06/2019 1232   HGB 10.8 (L) 05/07/2017 1045   HCT 31.1 (L) 03/06/2019 1232   HCT 30.8 (L) 05/07/2017 1045   PLT 177 03/06/2019 1232   PLT 134 (L) 05/07/2017 1045   MCV 89.9 03/06/2019 1232   MCV 100.3 05/07/2017 1045   MCH 31.2 03/06/2019 1232   MCHC 34.7 03/06/2019 1232   RDW 12.8 03/06/2019 1232   RDW 13.1 05/07/2017 1045   LYMPHSABS 0.7 03/06/2019 1232   LYMPHSABS 1.1 05/07/2017 1045   MONOABS 0.3 03/06/2019 1232   MONOABS 0.3 05/07/2017 1045   EOSABS 0.0 03/06/2019 1232   EOSABS 0.1 05/07/2017 1045   BASOSABS 0.0 03/06/2019 1232   BASOSABS 0.1 05/07/2017 1045   -------------------------------  Imaging from last 24 hours (if applicable):  Radiology interpretation: No results found.

## 2019-03-10 MED FILL — KISQALI 200 MG DAILY DOSE: 200 | 28 days supply | Qty: 21 | Fill #4

## 2019-03-14 ENCOUNTER — Telehealth: Payer: Self-pay

## 2019-03-14 NOTE — Telephone Encounter (Signed)
Pt medication assistance application for [place] faxed to [employer]

## 2019-03-18 ENCOUNTER — Telehealth: Payer: Self-pay

## 2019-03-18 NOTE — Telephone Encounter (Signed)
Oral Oncology Patient Advocate Encounter  The patients grant is going to expire soon and there are no grants open at the moment.  I spoke to the patients daughter, [name] who agreed to apply for patient assistance with [employer].   I faxed the completed application today, [phone].  This encounter will be updated until final determination.  [place] Patient [place] Phone [phone] Fax [phone] 03/18/2019   10:41 AM

## 2019-03-19 NOTE — Telephone Encounter (Signed)
Oral Oncology Patient Advocate Encounter  I followed up with [employer] on the [place] patient assistance application.  [employer] has received the application and started the benefit investigation.  This encounter will be updated until final determination.  [place] Patient [place] Phone [phone] Fax [phone] 03/19/2019   9:41 AM

## 2019-03-21 ENCOUNTER — Telehealth: Payer: Self-pay

## 2019-03-21 NOTE — Telephone Encounter (Signed)
Received call from [employer] patient assistance program stating that pt yearly out of pocket expense for Theresa Wallace will be $5,781.57

## 2019-03-24 NOTE — Telephone Encounter (Signed)
Oral Oncology Patient Advocate Encounter  I spoke to [facility] this morning, the benefit investigation is complete and the application has been sent over to the patient assistance program.  I was advised to follow up in 2 business days by calling [employer] at [phone].  This encounter will be updated until final determination.  Theresa Wallace Patient [place] Phone [phone] Fax [phone] 03/24/2019   8:37 AM

## 2019-03-25 NOTE — Telephone Encounter (Signed)
Oral Oncology Patient Advocate Encounter  Received notification from [employer] Patient Assistance program that patient has been successfully enrolled into their program to receive Kisqali from the manufacturer at $0 out of pocket until 05/22/19.    I called and left a voicemail for the patients daughter, [name].   Oral Oncology Clinic will continue to follow.  [place] Patient [place] Phone [phone] Fax [phone] 03/25/2019    9:19 AM

## 2019-03-27 NOTE — Telephone Encounter (Signed)
Theresa Wallace, this pt is being quoted a $5000+ monthl;y charge for [employer]-- any thoughts?  GM

## 2019-03-28 ENCOUNTER — Telehealth: Payer: Self-pay

## 2019-03-28 NOTE — Telephone Encounter (Signed)
Oral Oncology Patient Advocate [name] patient assistance will expire 05/22/19  I have a completed renewal application ready to be faxed to [employer] when the renewal period starts.  Oral Oncology clinic will continue to follow  [place] Patient [place] Phone [phone] Fax [phone] 03/28/2019   2:23 PM

## 2019-03-28 NOTE — Telephone Encounter (Signed)
Oral Oncology Pharmacist Encounter  Good morning all. We are already working with the patient to cover these out of pocket costs. Part of the application process is for the company to report the details of their benefits investigation. Patient would have that out of pocket yearly cost if we did not find them assistance for this. We are actively working on assistance. We will keep you all posted.  Johny Drilling, PharmD, BCPS, BCOP  03/28/2019 6:09 AM Oral Oncology Clinic [phone]

## 2019-04-08 ENCOUNTER — Telehealth: Payer: Self-pay | Admitting: Pharmacist

## 2019-04-08 DIAGNOSIS — C50012 Malignant neoplasm of nipple and areola, left female breast: Secondary | ICD-10-CM

## 2019-04-08 MED ORDER — RIBOCICLIB SUCC (200 MG DOSE) 200 MG PO TBPK: 200.0000 mg | ORAL_TABLET | Freq: Every day | ORAL | 6 refills | Status: DC

## 2019-04-08 NOTE — Telephone Encounter (Signed)
Oral Chemotherapy Pharmacist Encounter  Successfully enrolled patient for copayment assistance funds from Patient [place] [facility]) from the metastatic breast cancer fund  Award amount: $5400 Effective dates: 03/29/2019 - 03/28/2019 ID: [id number] BIN: [id number] Group: [id number] PCN: [id number]  Billing information will be shared with the [facility] outpatient pharmacy.  Johny Drilling, PharmD, BCPS, BCOP 04/08/2019 2:44 PM Oral Oncology Clinic [phone]

## 2019-04-08 NOTE — Telephone Encounter (Signed)
Oral Chemotherapy Pharmacist Encounter   Spoke with patient's daughter [name] today to follow up regarding patient's oral chemotherapy medication: Kisqali (ribociclib)for the treatment of metastatic, hormone receptor positive breast cancer in conjunction with Femara (letrozole), planned duration until disease progression or unacceptable toxicity  Original Start date of oral chemotherapy: 11/20/2018  [name] reports 0 tablets/doses of Kisqali 200 mg tablets, 1 tablet by mouth once daily taken with or without food, for 3 weeks on, 1 week off, repeated every 4 weeks, missed in the last month.  Patient takes her Kisqali with dinner each evening. She will start her next cycle on 04/10/19  Pt reports the following side effects: continued fatigue, unchanged and manageable  Pertinent labs reviewed: OK for continued treatment.  Other Issues: I was able to secure a copayment grant to cover out of pocket expenses for Kisqali at the [employer].  Patient will remain on manufacturer assistance with [employer] until 05/22/2019  Patient will start to receive Kisqali from the [place] starting in Jan 2021 and will ship on 05/26/2019 for deliver on 1/5. Patient should be starting her jan cycle on 06/05/2019.  All questions answered. They know to call the office with questions or concerns.  Johny Drilling, PharmD, BCPS, BCOP  04/08/2019 2:34 PM Oral Oncology Clinic [phone]

## 2019-04-09 DIAGNOSIS — H6123 Impacted cerumen, bilateral: Secondary | ICD-10-CM | POA: Diagnosis not present

## 2019-04-10 ENCOUNTER — Encounter (HOSPITAL_COMMUNITY): Payer: Self-pay | Admitting: Emergency Medicine

## 2019-04-10 ENCOUNTER — Emergency Department (HOSPITAL_COMMUNITY)
Admission: EM | Admit: 2019-04-10 | Discharge: 2019-04-10 | Disposition: A | Discharge status: Home / Self Care | Payer: PPO | Attending: Emergency Medicine | Admitting: Emergency Medicine

## 2019-04-10 ENCOUNTER — Emergency Department (HOSPITAL_COMMUNITY): Payer: PPO

## 2019-04-10 ENCOUNTER — Other Ambulatory Visit: Payer: Self-pay

## 2019-04-10 DIAGNOSIS — N183 Chronic kidney disease, stage 3 unspecified: Secondary | ICD-10-CM | POA: Insufficient documentation

## 2019-04-10 DIAGNOSIS — R531 Weakness: Secondary | ICD-10-CM | POA: Insufficient documentation

## 2019-04-10 DIAGNOSIS — R05 Cough: Secondary | ICD-10-CM | POA: Insufficient documentation

## 2019-04-10 DIAGNOSIS — Z79899 Other long term (current) drug therapy: Secondary | ICD-10-CM | POA: Insufficient documentation

## 2019-04-10 DIAGNOSIS — Y9289 Other specified places as the place of occurrence of the external cause: Secondary | ICD-10-CM | POA: Insufficient documentation

## 2019-04-10 DIAGNOSIS — I129 Hypertensive chronic kidney disease with stage 1 through stage 4 chronic kidney disease, or unspecified chronic kidney disease: Secondary | ICD-10-CM | POA: Diagnosis not present

## 2019-04-10 DIAGNOSIS — Z87891 Personal history of nicotine dependence: Secondary | ICD-10-CM | POA: Diagnosis not present

## 2019-04-10 DIAGNOSIS — S0003XA Contusion of scalp, initial encounter: Secondary | ICD-10-CM | POA: Diagnosis not present

## 2019-04-10 DIAGNOSIS — Y9389 Activity, other specified: Secondary | ICD-10-CM | POA: Diagnosis not present

## 2019-04-10 DIAGNOSIS — W19XXXA Unspecified fall, initial encounter: Secondary | ICD-10-CM | POA: Diagnosis not present

## 2019-04-10 DIAGNOSIS — Y999 Unspecified external cause status: Secondary | ICD-10-CM | POA: Insufficient documentation

## 2019-04-10 DIAGNOSIS — Z20828 Contact with and (suspected) exposure to other viral communicable diseases: Secondary | ICD-10-CM | POA: Insufficient documentation

## 2019-04-10 DIAGNOSIS — S199XXA Unspecified injury of neck, initial encounter: Secondary | ICD-10-CM | POA: Diagnosis not present

## 2019-04-10 DIAGNOSIS — S0990XA Unspecified injury of head, initial encounter: Secondary | ICD-10-CM | POA: Diagnosis present

## 2019-04-10 LAB — CBC WITH DIFFERENTIAL/PLATELET
Abs Immature Granulocytes: 0.06 10*3/uL (ref 0.00–0.07)
Basophils Absolute: 0.1 10*3/uL (ref 0.0–0.1)
Basophils Relative: 1 %
Eosinophils Absolute: 0.1 10*3/uL (ref 0.0–0.5)
Eosinophils Relative: 2 %
HCT: 36.7 % (ref 36.0–46.0)
Hemoglobin: 12.3 g/dL (ref 12.0–15.0)
Immature Granulocytes: 1 %
Lymphocytes Relative: 20 %
Lymphs Abs: 1.2 10*3/uL (ref 0.7–4.0)
MCH: 31.5 pg (ref 26.0–34.0)
MCHC: 33.5 g/dL (ref 30.0–36.0)
MCV: 94.1 fL (ref 80.0–100.0)
Monocytes Absolute: 0.6 10*3/uL (ref 0.1–1.0)
Monocytes Relative: 10 %
Neutro Abs: 4.2 10*3/uL (ref 1.7–7.7)
Neutrophils Relative %: 66 %
Platelets: 162 10*3/uL (ref 150–400)
RBC: 3.9 MIL/uL (ref 3.87–5.11)
RDW: 13.5 % (ref 11.5–15.5)
WBC: 6.3 10*3/uL (ref 4.0–10.5)
nRBC: 0 % (ref 0.0–0.2)

## 2019-04-10 LAB — URINALYSIS, ROUTINE W REFLEX MICROSCOPIC
Bilirubin Urine: NEGATIVE
Glucose, UA: NEGATIVE mg/dL
Hgb urine dipstick: NEGATIVE
Ketones, ur: NEGATIVE mg/dL
Leukocytes,Ua: NEGATIVE
Nitrite: NEGATIVE
Protein, ur: 30 mg/dL — AB
Specific Gravity, Urine: 1.013 (ref 1.005–1.030)
pH: 5 (ref 5.0–8.0)

## 2019-04-10 LAB — COMPREHENSIVE METABOLIC PANEL
ALT: 26 U/L (ref 0–44)
AST: 38 U/L (ref 15–41)
Albumin: 4 g/dL (ref 3.5–5.0)
Alkaline Phosphatase: 40 U/L (ref 38–126)
Anion gap: 11 (ref 5–15)
BUN: 21 mg/dL (ref 8–23)
CO2: 23 mmol/L (ref 22–32)
Calcium: 9.9 mg/dL (ref 8.9–10.3)
Chloride: 98 mmol/L (ref 98–111)
Creatinine, Ser: 1.2 mg/dL — ABNORMAL HIGH (ref 0.44–1.00)
GFR calc Af Amer: 45 mL/min — ABNORMAL LOW (ref >60)
GFR calc non Af Amer: 39 mL/min — ABNORMAL LOW (ref >60)
Glucose, Bld: 106 mg/dL — ABNORMAL HIGH (ref 70–99)
Potassium: 5.6 mmol/L — ABNORMAL HIGH (ref 3.5–5.1)
Sodium: 132 mmol/L — ABNORMAL LOW (ref 135–145)
Total Bilirubin: 1.4 mg/dL — ABNORMAL HIGH (ref 0.3–1.2)
Total Protein: 6.9 g/dL (ref 6.5–8.1)

## 2019-04-10 MED ORDER — SODIUM CHLORIDE 0.9 % IV SOLN
INTRAVENOUS | Status: DC
  Administered 2019-04-10: 21:00:00 via INTRAVENOUS

## 2019-04-10 NOTE — ED Triage Notes (Signed)
Patient is baseline demented, brought in by her daughter, who lives with her. Daughter reports weakness since yesterday around 2 pm and trouble ambulating with walker. She fell today, she was holding onto the counter and said she was going to fall, she did hit her head. Patient denies pain, there is a quarter-sized swelling area to her right posterior head.

## 2019-04-10 NOTE — ED Provider Notes (Signed)
[place] DEPT Provider Note   CSN: [id number] Arrival date & time: 04/10/19  1843     History   Chief Complaint Chief Complaint  Patient presents with  . Fall    HPI Theresa Wallace is a 85 y.o. female.     85 year old female presents from home after having a witnessed fall by her daughter.  Patient normally placed with a walker today while using it became weak and fell to the ground and struck the back part of her head.  Daughter try to break her fall.  No loss of consciousness.  Does have some pain to her occiput.     Past Medical History:  Diagnosis Date  . Breast cancer metastasized to multiple sites ([place]) 06/07/2011  . Cancer ([place])    breast ca  . GERD (gastroesophageal reflux disease)   . Hypercholesterolemia   . Hypertension   . Irritable bowel syndrome (IBS)   . Onychomycosis   . Sigmoid diverticulosis     Patient Active Problem List   Diagnosis Date Noted  . CKD (chronic kidney disease), stage III 04/03/2018  . Essential hypertension 04/03/2018  . Hyponatremia 04/01/2018  . Goals of care, counseling/discussion 08/27/2017  . Malignant neoplasm of overlapping sites of left breast in female, estrogen receptor positive ([place]) 09/11/2016  . Cancer of left breast ([place]) 09/13/2015  . Sensorineural hearing loss (SNHL), bilateral 08/23/2015  . Bilateral impacted cerumen 08/23/2015  . Genetic testing 12/18/2014  . COPD (chronic obstructive pulmonary disease) ([place]) 09/07/2014  . Breast cancer metastasized to multiple sites [facility]) 06/07/2011    Past Surgical History:  Procedure Laterality Date  . ABDOMINAL HYSTERECTOMY    . BREAST LUMPECTOMY    . CATARACT EXTRACTION    . CHOLECYSTECTOMY    . KNEE ARTHROSCOPY       OB History   No obstetric history on file.      Home Medications    Prior to Admission medications   Medication Sig Start Date End Date Taking? Authorizing Provider  aspirin 81 MG tablet Take 81 mg by mouth  See admin instructions. Every Monday &Thursday   Yes [provider]  Calcium Carbonate-Vitamin D (CALCIUM-VITAMIN D) 500-200 MG-UNIT per tablet Take 1 tablet by mouth 2 (two) times daily with a meal.   Yes [provider]  cholecalciferol (VITAMIN D) 400 UNITS TABS Take 2,000 Units by mouth daily.   Yes [provider]  esomeprazole (NEXIUM) 20 MG capsule Take 1 capsule (20 mg total) by mouth as needed. 07/30/17  Yes Magrinat, Virgie Dad, MD  fluconazole (DIFLUCAN) 150 MG tablet Take 150 mg by mouth as needed.  08/13/15  Yes [provider]  gabapentin (NEURONTIN) 100 MG capsule TAKE ONE CAPSULE BY MOUTH EVERY NIGHT AT BEDTIME AND MAY INCREASE TO TWO TIMES A DAY IF NEEDED Patient taking differently: Take 100 mg by mouth at bedtime.  03/05/19  Yes Magrinat, Virgie Dad, MD  ketoconazole (NIZORAL) 2 % cream Apply 1 application topically daily as needed for irritation.  12/28/17  Yes [provider]  ketotifen (ZADITOR) 0.025 % ophthalmic solution Place 1 drop into both eyes as needed (itchy eyes).   Yes [provider]  letrozole (FEMARA) 2.5 MG tablet Take 1 tablet (2.5 mg total) by mouth daily. 10/21/18  Yes Magrinat, Virgie Dad, MD  levothyroxine (SYNTHROID, LEVOTHROID) 25 MCG tablet Take 25 mcg by mouth daily.   Yes [name], MD  losartan (COZAAR) 50 MG tablet Take 50 mg by mouth  daily.  10/17/12  Yes [provider]  metroNIDAZOLE (METROGEL) 0.75 % gel Apply topically 2 (two) times daily. For rosacea 12/27/15  Yes [provider]  Multiple Vitamin (MULTIVITAMIN) tablet Take 1 tablet by mouth daily.   Yes [provider]  propranolol ER (INDERAL LA) 60 MG 24 hr capsule Take 60 mg by mouth at bedtime.  02/19/13  Yes [provider]  ribociclib succ Thelma Comp 200MG  DAILY DOSE) 200 MG Therapy Pack Take 1 tablet (200 mg total) by mouth daily. Take for 21 days on, 7 days off, repeat every 28 days. 04/08/19  Yes Magrinat, Virgie Dad, MD  sertraline (ZOLOFT) 50 MG tablet Take 1 tablet (50 mg total) by mouth daily. Patient not taking: Reported on 03/06/2019 02/27/19   Gardenia Phlegm, NP    Family History Family History  Problem Relation Age of Onset  . Breast cancer Sister 74  . Breast cancer Cousin        dx in her 56s  . Bone cancer Mother     Social History Social History   Tobacco Use  . Smoking status: Former Smoker    Quit date: 05/22/1968    Years since quitting: 50.9  . Smokeless tobacco: Never Used  Substance Use Topics  . Alcohol use: Yes    Comment: 2 yearly  . Drug use: No     Allergies   Cantaloupe (diagnostic), Ciprofloxacin, Epinephrine, Pineapple, Tamoxifen, and Mupirocin   Review of Systems Review of Systems  All other systems reviewed and are negative.    Physical Exam Updated Vital Signs BP (!) 165/104 (BP Location: Left Arm)   Pulse 96   Temp 98.3 F (36.8 C)   Resp 20   SpO2 98%   Physical Exam Vitals signs and nursing note reviewed.  Constitutional:      General: She is not in acute distress.    Appearance: Normal appearance. She is well-developed. She is not toxic-appearing.  HENT:     Head: Normocephalic and atraumatic.   Eyes:     General: Lids are normal.     Conjunctiva/sclera: Conjunctivae normal.     Pupils: Pupils are equal, round, and reactive to light.  Neck:     Musculoskeletal: Normal range of motion and neck supple.     Thyroid: No thyroid mass.     Trachea: No tracheal deviation.  Cardiovascular:     Rate and Rhythm: Normal rate and regular rhythm.     Heart sounds: Normal heart sounds. No murmur. No gallop.   Pulmonary:     Effort: Pulmonary effort is normal. No respiratory distress.     Breath sounds: Normal breath sounds. No stridor. No decreased breath sounds, wheezing, rhonchi or rales.  Abdominal:     General: Bowel sounds are normal. There is no distension.     Palpations: Abdomen is soft.     Tenderness: There is no  abdominal tenderness. There is no rebound.  Musculoskeletal: Normal range of motion.        General: No tenderness.     Comments: No shortening or rotation of patient's lower extremities  Skin:    General: Skin is warm and dry.     Findings: No abrasion or rash.  Neurological:     Mental Status: She is alert and oriented to person, place, and time.     GCS: GCS eye subscore is 4. GCS verbal subscore is 5. GCS motor subscore is 6.     Cranial Nerves: No  cranial nerve deficit.     Sensory: No sensory deficit.     Comments: Patient moves all 4 extremities appropriately.  Psychiatric:        Attention and Perception: Attention normal.        Speech: Speech normal.        Behavior: Behavior normal.      ED Treatments / Results  Labs (all labs ordered are listed, but only abnormal results are displayed) Labs Reviewed  URINE CULTURE  SARS CORONAVIRUS 2 (TAT 6-24 HRS)  URINALYSIS, ROUTINE W REFLEX MICROSCOPIC  CBC WITH DIFFERENTIAL/PLATELET  COMPREHENSIVE METABOLIC PANEL    EKG None  Radiology No results found.  Procedures Procedures (including critical care time)  Medications Ordered in ED Medications  0.9 %  sodium chloride infusion (has no administration in time range)     Initial Impression / Assessment and Plan / ED Course  I have reviewed the triage vital signs and the nursing notes.  Pertinent labs & imaging results that were available during my care of the patient were reviewed by me and considered in my medical decision making (see chart for details).        Patient's potassium is 5.6 noted and I discussed this with her daughter she says that this has been a chronic issue for her and that they are managing with diet.  Creatinine is stable.  Head CT negative.  CBC is reassuring.  Patient's urinalysis without infection.  Daughter states she feels comfortable taking her home and will follow up with her doctor.  Final Clinical Impressions(s) / ED Diagnoses    Final diagnoses:  None    ED Discharge Orders    None       Lacretia Leigh, MD 04/10/19 2303

## 2019-04-11 LAB — SARS CORONAVIRUS 2 (TAT 6-24 HRS): SARS Coronavirus 2: NEGATIVE

## 2019-04-12 LAB — URINE CULTURE: Culture: <10000 — AB

## 2019-04-21 ENCOUNTER — Ambulatory Visit (HOSPITAL_COMMUNITY)
Admission: RE | Admit: 2019-04-21 | Discharge: 2019-04-21 | Disposition: A | Discharge status: Home / Self Care | Payer: PPO | Source: Ambulatory Visit | Attending: Adult Health | Admitting: Adult Health

## 2019-04-21 ENCOUNTER — Other Ambulatory Visit: Payer: Self-pay

## 2019-04-21 DIAGNOSIS — Z23 Encounter for immunization: Secondary | ICD-10-CM | POA: Diagnosis not present

## 2019-04-21 DIAGNOSIS — C50012 Malignant neoplasm of nipple and areola, left female breast: Secondary | ICD-10-CM | POA: Diagnosis not present

## 2019-04-21 DIAGNOSIS — J90 Pleural effusion, not elsewhere classified: Secondary | ICD-10-CM | POA: Diagnosis not present

## 2019-04-21 DIAGNOSIS — C50922 Malignant neoplasm of unspecified site of left male breast: Secondary | ICD-10-CM | POA: Diagnosis present

## 2019-04-21 DIAGNOSIS — K769 Liver disease, unspecified: Secondary | ICD-10-CM | POA: Insufficient documentation

## 2019-04-21 DIAGNOSIS — C50919 Malignant neoplasm of unspecified site of unspecified female breast: Secondary | ICD-10-CM

## 2019-04-21 DIAGNOSIS — C50812 Malignant neoplasm of overlapping sites of left female breast: Secondary | ICD-10-CM | POA: Insufficient documentation

## 2019-04-21 DIAGNOSIS — Z170 Estrogen receptor positive status [ER+]: Secondary | ICD-10-CM | POA: Diagnosis not present

## 2019-04-21 DIAGNOSIS — J439 Emphysema, unspecified: Secondary | ICD-10-CM | POA: Insufficient documentation

## 2019-04-21 DIAGNOSIS — I700 Atherosclerosis of aorta: Secondary | ICD-10-CM | POA: Insufficient documentation

## 2019-04-21 DIAGNOSIS — R918 Other nonspecific abnormal finding of lung field: Secondary | ICD-10-CM | POA: Insufficient documentation

## 2019-04-21 DIAGNOSIS — I2510 Atherosclerotic heart disease of native coronary artery without angina pectoris: Secondary | ICD-10-CM | POA: Insufficient documentation

## 2019-04-21 LAB — GLUCOSE, CAPILLARY: Glucose-Capillary: 111 mg/dL — ABNORMAL HIGH (ref 70–99)

## 2019-04-21 MED ORDER — FLUDEOXYGLUCOSE F - 18 (FDG) INJECTION
8.0500 | Freq: Once | INTRAVENOUS | Status: AC | PRN
  Administered 2019-04-21: 8.05 via INTRAVENOUS

## 2019-04-22 ENCOUNTER — Other Ambulatory Visit: Payer: Self-pay | Admitting: Oncology

## 2019-04-22 ENCOUNTER — Telehealth: Payer: Self-pay | Admitting: Oncology

## 2019-04-22 NOTE — Progress Notes (Unsigned)
Molecular studies: (1) BRCA 1-2 negative (b)    Called [place] and spoke with her daughter in Sports coach [name].  Pat stable.  No bony lesions.  We are changing the visit next week to virtual.

## 2019-04-22 NOTE — Telephone Encounter (Signed)
Per GM cancelled 12/10 lab and converted 12/10 f/u to telephone visit. See also 12/1 schedule message.  Confirmed with dtr. Injection appointment also cancelled.

## 2019-04-29 ENCOUNTER — Encounter: Payer: Self-pay | Admitting: Oncology

## 2019-04-29 ENCOUNTER — Other Ambulatory Visit: Payer: Self-pay | Admitting: *Deleted

## 2019-04-29 MED ORDER — RIBOCICLIB SUCC (200 MG DOSE) 200 MG PO TBPK: 200.0000 mg | ORAL_TABLET | Freq: Every day | ORAL | 0 refills | Status: DC

## 2019-04-30 NOTE — Progress Notes (Signed)
ID: Theresa Wallace   DOB: 02/12/1924  MR#: [id number]  ZJQ#:[id number]   Patient Care Team: [name], MD as PCP - General (Internal Medicine) Magrinat, Virgie Dad, MD as Consulting Physician (Oncology) Jolene Provost, PA-C as Consulting Physician (Otolaryngology) OTHER MD:  I connected with Theresa Wallace on 05/01/19 at 11:30 AM EST by telephone visit and verified that I am speaking with the correct person using two identifiers.   I discussed the limitations, risks, security and privacy concerns of performing an evaluation and management service by telemedicine and the availability of in-person appointments. I also discussed with the patient that there may be a patient responsible charge related to this service. The patient expressed understanding and agreed to proceed.   Other persons participating in the visit and their role in the encounter: Daughter  Patients location: Home Providers location: Clinic   CHIEF COMPLAINT:  Metastatic Breast Cancer, estrogen receptor positive  CURRENT TREATMENT: letrozole, ribociclib, [denosumab/ Xgeva]   INTERVAL HISTORY: Theresa Wallace was contacted today for follow-up and treatment of her estrogen receptor positive metastatic breast cancer.  Her daughter [name] participated in the call  Dessie continues on letrozole.  Hot flashes are not a major issue.  She also continues on ribociclib, which she takes at 228m daily 3 weeks on and 1 week off.  She is not having diarrhea from this in fact she is occasionally constipated.  She last received Xgeva on 02/27/2019.  This is currently on hold because of the pandemic  Since her last visit, she underwent repeat PET scan on 04/21/2019, which showed: slight increase in size and/or hypermetabolism involving a left axillary and left pleural nodules; hypermetabolic lesions in the right middle lobe and liver dome appear grossly stable; trace left pleural effusion.  She presented to the ED on 04/10/2019 with increasing  weakness and after experiencing a fall. Chest x-ray performed at that time showed: chronic coarsened interstitial and bronchitic changes in the lung bases; no active cardiopulmonary disease.  She also underwent head and cervical spine CT at that time, which showed: right posterior scalp hematoma without underlying skull fracture; no other acute traumatic injury identified in the head or cervical spine.  Coronavirus testing performed that day was also negative.   REVIEW OF SYSTEMS: RShuntaviahas had no further fall episodes according to her daughter.  Her legs seem to be a little bit stronger.  Overall she is doing "good".  Her appetite goes up and down.  She is tired, but perhaps not much more so than might be expected from someone her age.  She denies pain.  The review of systems today was otherwise noncontributory   HISTORY OF PRESENT ILLNESS: From the prior summary:  RNasteho Glantzis an 85y.o.  [place] woman I saw briefly when she moved to this area in 2007.  [facility] [phone][place].  It was grade 2, measured 1 cm maximally and was strongly estrogen and progesterone-receptor positive.  HER-2 was not checked.  This was T1b NX invasive ductal carcinoma, stage I, and she was treated after radiation with anastrozole for several years, eventually switching to Evista primarily for cost reasons.   More recently, she presented to Dr. GInda Merlinwith fatigue and a dry cough.  He auscultated dullness to percussion at the left base and obtained a chest x-ray which confirmed the presence of a left pleural effusion.   Dr. GInda Merlinscheduled Deanna for an ultrasound-guided  left thoracentesis, and this was performed November 09, 2010.  Approximately 1 L of amber-colored fluid was removed.  Cytology from this procedure ([id number]) showed only reactive mesothelial cells.  ° °The patient was then referred here and set up for a CT of the  chest and a PET scan.  The CT of the chest on June 20th showed 2 slightly enlarged left axillary lymph nodes with some irregular contours but more importantly multiple pleural nodules in the left chest measuring up to 4.6 cm.  The right chest was normal.  There was no pericardial effusion.  There was only a small to moderate left pleural effusion remaining, and aside from left lower lobe collapse or consolidation, there was no evidence of lung parenchymal involvement.  There was no evidence of bone involvement and also no calcified pleural plaques and no worrisome bone involvement.  PET scan on June 27th showed the nodularity along the left pleura to be intensely hypermetabolic.  For example the large lobe nodule measuring 4.5 cm had an SUV max of 12.9.  There was no hypermetabolic mediastinal nodal activity.  The to left axillary lymph nodes were very mildly hypermetabolic.  The left breast was clear.  There was 1 hypermetabolic lesion in the liver measuring 3.2 cm.  On June 27th, Dr. Bertrand performed fine-needle aspiration of a 7 mm abnormal-appearing lymph node in the lower left axilla.  The cytology from this procedure, however, ([name]) also was inconclusive showing no evidence of nodal tissue, mostly blood and fatty tissue.  ° °Her subsequent history is as detailed below ° ° °PAST MEDICAL HISTORY: °Past Medical History:  °Diagnosis Date  °• Breast cancer metastasized to multiple sites (HCC) 06/07/2011  °• Cancer (HCC)   ° breast ca  °• GERD (gastroesophageal reflux disease)   °• Hypercholesterolemia   °• Hypertension   °• Irritable bowel syndrome (IBS)   °• Onychomycosis   °• Sigmoid diverticulosis   °Benign tremor. °History of palpitations with Holter monitor in 2008 showing PACs. ° ° °PAST SURGICAL HISTORY: °Past Surgical History:  °Procedure Laterality Date  °• ABDOMINAL HYSTERECTOMY    °• BREAST LUMPECTOMY    °• CATARACT EXTRACTION    °• CHOLECYSTECTOMY    °• KNEE ARTHROSCOPY    ° ° °FAMILY  HISTORY °Family History  °Problem Relation Age of Onset  °• Breast cancer Sister 70  °• Breast cancer Cousin   °     dx in her 60s  °• Bone cancer Mother   °[name] died in an automobile accident.  [name] died with "bone cancer."  The patient had 3 sisters.  One had breast cancer develop in her 70s.  There is also a cousin with breast cancer but no history of ovarian cancer or other history of cancer in first-degree relatives. ° ° °GYNECOLOGIC HISTORY: °No LMP recorded. Patient has had a hysterectomy. °She is GX, P4, first pregnancy to term at age 24.  She was on the hormone replacement after her hysterectomy but only briefly and stopped when she had the initial diagnosis of breast cancer in 1996. °She underwent hysterectomy without BSO. ° ° °SOCIAL HISTORY: (Updated May 2018) °She worked as a secretary.  She has been widowed since 2005, her husband Jack dying after a long illness involving strokes and a myocardial infarction.  Her significant other George Dheisen who is a retired engineer is now suffering from dementia and is in a skilled nursing facility. The patient lives with her daughter [name] who used   to be a Network engineer for Dr. Inda Merlin, and [name]'s husband, [name], who is a 4 assistant with Dr. Durward Fortes.  The patient has 12 grandchildren and 5 great-grandchildren.    ADVANCED DIRECTIVES: In place   HEALTH MAINTENANCE:  Social History   Tobacco Use   Smoking status: Former Smoker    Quit date: 05/22/1968    Years since quitting: 50.9   Smokeless tobacco: Never Used  Substance Use Topics   Alcohol use: Yes    Comment: 2 yearly   Drug use: No     Colonoscopy:  PAP: s/p remote hysterectomy  Bone density:  10/23/2011, normal  Lipid panel: Dr. Inda Merlin, UTD   Allergies  Allergen Reactions   Cantaloupe (Diagnostic)    Ciprofloxacin    Epinephrine    Pineapple    Tamoxifen    Mupirocin Rash    Current Outpatient Medications   Medication Sig Dispense Refill   aspirin 81 MG tablet Take 81 mg by mouth See admin instructions. Every Monday &Thursday     Calcium Carbonate-Vitamin D (CALCIUM-VITAMIN D) 500-200 MG-UNIT per tablet Take 1 tablet by mouth 2 (two) times daily with a meal.     cholecalciferol (VITAMIN D) 400 UNITS TABS Take 2,000 Units by mouth daily.     esomeprazole (NEXIUM) 20 MG capsule Take 1 capsule (20 mg total) by mouth as needed.     fluconazole (DIFLUCAN) 150 MG tablet Take 150 mg by mouth as needed.      gabapentin (NEURONTIN) 100 MG capsule TAKE ONE CAPSULE BY MOUTH EVERY NIGHT AT BEDTIME AND MAY INCREASE TO TWO TIMES A DAY IF NEEDED (Patient taking differently: Take 100 mg by mouth at bedtime. ) 60 capsule 1   ketoconazole (NIZORAL) 2 % cream Apply 1 application topically daily as needed for irritation.      ketotifen (ZADITOR) 0.025 % ophthalmic solution Place 1 drop into both eyes as needed (itchy eyes).     letrozole (FEMARA) 2.5 MG tablet Take 1 tablet (2.5 mg total) by mouth daily. 90 tablet 4   levothyroxine (SYNTHROID, LEVOTHROID) 25 MCG tablet Take 25 mcg by mouth daily.     losartan (COZAAR) 50 MG tablet Take 50 mg by mouth daily.      metroNIDAZOLE (METROGEL) 0.75 % gel Apply topically 2 (two) times daily. For rosacea     Multiple Vitamin (MULTIVITAMIN) tablet Take 1 tablet by mouth daily.     propranolol ER (INDERAL LA) 60 MG 24 hr capsule Take 60 mg by mouth at bedtime.      ribociclib succ (KISQALI 200MG DAILY DOSE) 200 MG Therapy Pack Take 1 tablet (200 mg total) by mouth daily. Take for 21 days on, 7 days off, repeat every 28 days. 21 tablet 6   ribociclib succ (KISQALI 200MG DAILY DOSE) 200 MG Therapy Pack Take 1 tablet (200 mg total) by mouth daily. Take for 21 days on, 7 days off, repeat every 28 days. 21 tablet 0   sertraline (ZOLOFT) 50 MG tablet Take 1 tablet (50 mg total) by mouth daily. (Patient not taking: Reported on 03/06/2019) 30 tablet 3   No current  facility-administered medications for this visit.     OBJECTIVE:  There were no vitals filed for this visit. There is no height or weight on file to calculate BMI.  There were no vitals filed for this visit.   Televisit  LAB RESULTS: Lab Results  Component Value Date   WBC 6.3 04/10/2019   NEUTROABS 4.2 04/10/2019  HGB 12.3 04/10/2019   HCT 36.7 04/10/2019   MCV 94.1 04/10/2019   PLT 162 04/10/2019      Chemistry      Component Value Date/Time   NA 132 (L) 04/10/2019 2104   NA 129 (L) 05/07/2017 1045   K 5.6 (H) 04/10/2019 2104   K 5.0 05/07/2017 1045   CL 98 04/10/2019 2104   CL 104 10/24/2012 1014   CO2 23 04/10/2019 2104   CO2 25 05/07/2017 1045   BUN 21 04/10/2019 2104   BUN 17.6 05/07/2017 1045   CREATININE 1.20 (H) 04/10/2019 2104   CREATININE 1.23 (H) 03/06/2019 1232   CREATININE 1.1 05/07/2017 1045      Component Value Date/Time   CALCIUM 9.9 04/10/2019 2104   CALCIUM 9.3 05/07/2017 1045   ALKPHOS 40 04/10/2019 2104   ALKPHOS 37 (L) 05/07/2017 1045   AST 38 04/10/2019 2104   AST 24 03/06/2019 1232   AST 20 05/07/2017 1045   ALT 26 04/10/2019 2104   ALT 19 03/06/2019 1232   ALT 13 05/07/2017 1045   BILITOT 1.4 (H) 04/10/2019 2104   BILITOT 1.4 (H) 03/06/2019 1232   BILITOT 1.55 (H) 05/07/2017 1045      IMAGING STUDIES: Ct Head Wo Contrast  Result Date: 04/10/2019 CLINICAL DATA:  85 year old female with increasing weakness since 2 p.m. yesterday. Fall today. History of metastatic breast cancer. EXAM: CT HEAD WITHOUT CONTRAST CT CERVICAL SPINE WITHOUT CONTRAST TECHNIQUE: Multidetector CT imaging of the head and cervical spine was performed following the standard protocol without intravenous contrast. Multiplanar CT image reconstructions of the cervical spine were also generated. COMPARISON:  Head CT 04/01/2018. Cervical spine radiographs 06/12/2017. FINDINGS: CT HEAD FINDINGS Brain: Stable cerebral volume since 2019, normal for age. Gray-white matter  differentiation also appears stable and within normal limits for age. No midline shift, ventriculomegaly, mass effect, evidence of mass lesion, intracranial hemorrhage or evidence of cortically based acute infarction. Vascular: Calcified atherosclerosis at the skull base. No suspicious intracranial vascular hyperdensity. Skull: No acute osseous abnormality identified. Sinuses/Orbits: Stable and well pneumatized with a small right maxillary sinus retention cysts. Tympanic cavities and mastoids remain clear. Other: Broad-based right posterior scalp hematoma on series 4, image 52. Underlying calvarium intact. Otherwise stable and negative orbit and scalp soft tissues. Small chronic benign left forehead scalp lipoma. CT CERVICAL SPINE FINDINGS Alignment: Mild levoconvex cervical spine scoliosis. Improved cervical lordosis compared to the 2019 radiographs. Cervicothoracic junction alignment is within normal limits. Bilateral posterior element alignment is within normal limits. Skull base and vertebrae: Visualized skull base is intact. No atlanto-occipital dissociation. No acute osseous abnormality identified. Soft tissues and spinal canal: No prevertebral fluid or swelling. No visible canal hematoma. Negative noncontrast neck soft tissues. Disc levels: Mild for age cervical spine degeneration, mostly right side facet arthropathy. Upper chest: Visible upper thoracic levels appear grossly intact. Negative lung apices. IMPRESSION: 1. Right posterior scalp hematoma without underlying skull fracture. 2. No other acute traumatic injury identified in the head or cervical spine. 3. Stable and normal for age non contrast CT appearance of the brain. 4. Mild for age cervical spine degeneration. Electronically Signed   By: Genevie Ann M.D.   On: 04/10/2019 22:13   Ct Cervical Spine Wo Contrast  Result Date: 04/10/2019 CLINICAL DATA:  85 year old female with increasing weakness since 2 p.m. yesterday. Fall today. History of  metastatic breast cancer. EXAM: CT HEAD WITHOUT CONTRAST CT CERVICAL SPINE WITHOUT CONTRAST TECHNIQUE: Multidetector CT imaging of the head and  cervical spine was performed following the standard protocol without intravenous contrast. Multiplanar CT image reconstructions of the cervical spine were also generated. COMPARISON:  Head CT 04/01/2018. Cervical spine radiographs 06/12/2017. FINDINGS: CT HEAD FINDINGS Brain: Stable cerebral volume since 2019, normal for age. Gray-white matter differentiation also appears stable and within normal limits for age. No midline shift, ventriculomegaly, mass effect, evidence of mass lesion, intracranial hemorrhage or evidence of cortically based acute infarction. Vascular: Calcified atherosclerosis at the skull base. No suspicious intracranial vascular hyperdensity. Skull: No acute osseous abnormality identified. Sinuses/Orbits: Stable and well pneumatized with a small right maxillary sinus retention cysts. Tympanic cavities and mastoids remain clear. Other: Broad-based right posterior scalp hematoma on series 4, image 52. Underlying calvarium intact. Otherwise stable and negative orbit and scalp soft tissues. Small chronic benign left forehead scalp lipoma. CT CERVICAL SPINE FINDINGS Alignment: Mild levoconvex cervical spine scoliosis. Improved cervical lordosis compared to the 2019 radiographs. Cervicothoracic junction alignment is within normal limits. Bilateral posterior element alignment is within normal limits. Skull base and vertebrae: Visualized skull base is intact. No atlanto-occipital dissociation. No acute osseous abnormality identified. Soft tissues and spinal canal: No prevertebral fluid or swelling. No visible canal hematoma. Negative noncontrast neck soft tissues. Disc levels: Mild for age cervical spine degeneration, mostly right side facet arthropathy. Upper chest: Visible upper thoracic levels appear grossly intact. Negative lung apices. IMPRESSION: 1. Right  posterior scalp hematoma without underlying skull fracture. 2. No other acute traumatic injury identified in the head or cervical spine. 3. Stable and normal for age non contrast CT appearance of the brain. 4. Mild for age cervical spine degeneration. Electronically Signed   By: H  Hall M.D.   On: 04/10/2019 22:13  ° °Nm Pet Image Restag (ps) Skull Base To Thigh ° °Result Date: 04/21/2019 °CLINICAL DATA:  Subsequent treatment strategy for breast cancer. EXAM: NUCLEAR MEDICINE PET SKULL BASE TO THIGH TECHNIQUE: 8.1 mCi F-18 FDG was injected intravenously. Full-ring PET imaging was performed from the skull base to thigh after the radiotracer. CT data was obtained and used for attenuation correction and anatomic localization. Fasting blood glucose: 111 mg/dl COMPARISON:  10/16/2018. FINDINGS: Mediastinal blood pool activity: SUV max 3.3 Liver activity: SUV max NA NECK: No abnormal hypermetabolism. Incidental CT findings: None. CHEST: Irregular nodule in the high left axilla measures 0.9 x 1.3 cm (4/64) with an SUV max of 5.1, similar in size but slightly increased in hypermetabolism from 10/16/2018, SUV max 2.7. Pleural nodule in the medial left hemithorax measures 5 x 10 mm (4/62) with an SUV max of 6.3, compared to 4 x 7 mm and SUV max 4.2 on 10/16/2018. Inferior right middle lobe nodule measures approximately 7 mm (8/52) with an SUV max of 5.3, likely stable from 10/16/2018. No hypermetabolic mediastinal, hilar or right axillary lymph nodes. Incidental CT findings: Atherosclerotic calcification of the aorta, aortic valve and coronary arteries. Heart is at the upper limits of normal in size to mildly enlarged. No pericardial effusion. Mild centrilobular emphysema. Trace left pleural fluid. ABDOMEN/PELVIS: Hypodense mass in the dome of the liver measures 4.5 x 5.1 cm (4/91) with an SUV max of 7.4, stable. No additional abnormal hypermetabolism in the liver, adrenal glands, spleen or pancreas. No hypermetabolic lymph  nodes. Incidental CT findings: A hyperdense lesion in the inferior right hepatic lobe measures 2.9 x 2.9 cm (4/106), similar to 10/16/2018 and not hypermetabolic. Cholecystectomy. Right adrenal gland is unremarkable. There may be slight nodular thickening of the left adrenal gland. Subcentimeter low-attenuation   lesion in the right kidney is too small to characterize. Kidneys, spleen, pancreas, stomach and bowel are otherwise unremarkable. Atherosclerotic calcification of the aorta. Pelvic floor laxity. Small bilateral inguinal hernias contain fat. SKELETON: No abnormal osseous hypermetabolism. Incidental CT findings: Degenerative changes in the spine. IMPRESSION: 1. Slight increase in size and/or hypermetabolism involving a left axillary and left pleural nodules. Hypermetabolic lesions in the right middle lobe and liver dome appear grossly stable. 2. Trace left pleural effusion. 3. Aortic atherosclerosis (ICD10-170.0). Coronary artery calcification. 4.  Emphysema (ICD10-J43.9). Electronically Signed   By: Melinda  Blietz M.D.   On: 04/21/2019 09:44  ° °Dg Chest Port 1 View ° °Result Date: 04/10/2019 °CLINICAL DATA:  Cough EXAM: PORTABLE CHEST 1 VIEW COMPARISON:  Radiograph 04/01/2018, CT 01/11/2017 FINDINGS: Chronic coarsened interstitial changes in the lung bases are similar to prior. Mild airways thickening is noted. No consolidation, features of edema, pneumothorax, or effusion. The aorta is calcified. The remaining cardiomediastinal contours are unremarkable. Degenerative changes are present in the imaged spine and shoulders. Changes most pronounced in the left shoulder. No acute osseous or soft tissue abnormality. IMPRESSION: 1. Chronic coarsened interstitial and bronchitic changes in the lung bases. 2. No active cardiopulmonary disease. Electronically Signed   By: Price  DeHay M.D.   On: 04/10/2019 20:42  ° ° °ASSESSMENT: 85 y.o. BRCA negative [place] woman: ° °(1) Status post left lumpectomy in 1996 for a  T1b N0 grade 2 invasive ductal carcinoma which was strongly estrogen and progesterone receptor positive, treated with radiation, then anastrozole, then Evista, all treatment discontinued July 2012 ° °METASTATIC DISEASE: July 2012 °(2) recurrence to the lining of the left lung and liver documented by liver biopsy July 2012, showing metastatic adenocarcinoma estrogen and progesterone receptor positive at 96% and 97% respectively; there was no Her-2 amplification.  ° (a) left axillary lymph node biopsy 09/27/2016 shows breast cancer, 95% estrogen repeat receptor positive with strong staining intensity, progesterone and HER-2 negative. ° °(3)  She has been on exemestane since early July of 2012, with chest CT scan April 2013 showing complete resolution of her lung and liver metastases ° (a) bone density in June of 08/09/2011 was normal ° (b) bone density 03/20/2016 was normal with a T score of -0.6 ° (c) exemestane continued at the time of disease progression noted May 2018 ° (d) exemestane discontinued April 2019 ° °(4) MRI of the lumbar spine 09-2012 showed significant degenerative disease but no evidence of metastatic disease to bone. ° °(5) disease progression noted May 2018: CT scan shows liver, lung, and bone lesions ° °(6) fulvestrant started 09/26/2016, palbociclib added 10/24/2016 at 125 mg daily, 21 days on. 7 days off ° (a) palbociclib dose decreased to 100mg daily on 02/14/2017 ° (b) PET scan 10/16/2018 shows progression in the liver no new sites of disease ° (c) fulvestrant discontinued; letrozole substituted ° (d) palbociclib discontinued, ribociclib substituted ° (e) consider consult to interventional radiology for Yttrium treatment of liver lesion ° (f) fulvestrant stopped after the 09/23/2018 dose and letrozole started to minimize visits during the pandemic ° °(7) denosumab/Xgeva started 11/21/2016, repeated every 28 days until 11/19/2017, then every 8 weeks thereafter ° (a) Xgeva changed to every 12 weeks  beginning with the 05/06/2018 dose ° (b) Xgeva held after the October 2020 dose because of the pandemic ° °(8) Caris requested 10/21/2018, from 09/27/2016 lymph node biopsy ° °PLAN:  °Gabriana is now 8-1/2 years out from definitive diagnosis of metastatic breast cancer.  Her disease is well controlled   according to the most recent PET scan. ° °She is tolerating the ribociclib and letrozole well, and specifically she is not having diarrhea problems from this.  She may be having some fatigue from the treatment. ° °Her last set of counts obtained at the time of her ER visit showed a normal white cell count, no anemia, and normal platelet count. ° °If and when we get enough vaccine to start distributing I think both Esmae and her daughter should be at the top of the list.  If they need a prescription or note from me I will be very glad to provide ° °Otherwise Evarose will return to see me in April 2021.  She knows to call for any other issue that may develop before that visit. ° °Gustav C. Magrinat, MD 04/30/19 5:07 PM °Medical Oncology and Hematology °[facility] °[address] °, [place] [address] °Tel. [phone]    Fax. [phone] ° ° °I, Katie Daubenspeck, am acting as scribe for Dr. Gustav C. Magrinat. ° °I, Gustav Magrinat MD, have reviewed the above documentation for accuracy and completeness, and I agree with the above. ° ° ° °

## 2019-05-01 ENCOUNTER — Ambulatory Visit: Payer: PPO

## 2019-05-01 ENCOUNTER — Other Ambulatory Visit: Payer: PPO

## 2019-05-01 ENCOUNTER — Inpatient Hospital Stay: Payer: PPO | Attending: Adult Health | Admitting: Oncology

## 2019-05-01 DIAGNOSIS — C50919 Malignant neoplasm of unspecified site of unspecified female breast: Secondary | ICD-10-CM

## 2019-05-19 ENCOUNTER — Telehealth: Payer: Self-pay

## 2019-05-19 NOTE — Telephone Encounter (Signed)
Oral Oncology Patient Advocate Encounter  Was successful in securing patient a $15000 grant from [employer] to provide copayment coverage for [place].  This will keep the out of pocket expense at $0.     [employer] ID: [id number]  I have spoken with the patient.   The billing information is as follows and has been shared with [place].    RxBin: [id number] PCN: [id number] Member ID: [id number]  Group ID: [id number] Dates of Eligibility: 04/19/19 through 04/11/20  [place] [facility] [place] Phone [phone] Fax [phone] 05/19/2019 11:12 AM

## 2019-05-26 ENCOUNTER — Telehealth: Payer: Self-pay

## 2019-05-26 MED FILL — KISQALI 200 MG DAILY DOSE: 200 | 28 days supply | Qty: 21 | Fill #0

## 2019-05-26 NOTE — Telephone Encounter (Signed)
Oral Oncology Patient Advocate Encounter   Was successful in securing patient a $4000 grant from [employer] to provide copayment coverage for Kisqali.  This will keep the out of pocket expense at $0.       The billing information is as follows and has been shared with [place].   Member ID: [id number] Group ID: [id number] RxBin: [id number] PCN: [id number] Dates of Eligibility: 05/26/19 through 05/25/20  Fund name:  Breast.  [place] Patient [place] Phone [phone] Fax [phone] 05/26/2019 6:06 PM

## 2019-05-28 DIAGNOSIS — H6123 Impacted cerumen, bilateral: Secondary | ICD-10-CM | POA: Diagnosis not present

## 2019-05-31 ENCOUNTER — Ambulatory Visit: Payer: Medicare Other | Attending: Internal Medicine

## 2019-05-31 DIAGNOSIS — Z23 Encounter for immunization: Secondary | ICD-10-CM

## 2019-05-31 NOTE — Progress Notes (Signed)
   Covid-19 Vaccination Clinic  Name:  Theresa Wallace    MRN: [id number] DOB: 28-Mar-1924  05/31/2019  Theresa Wallace was observed post Covid-19 immunization for 30 minutes based on pre-vaccination screening without incidence. She was provided with Vaccine Information Sheet and instruction to access the V-Safe system.   Theresa Wallace was instructed to call 911 with any severe reactions post vaccine: Marland Kitchen Difficulty breathing  . Swelling of your face and throat  . A fast heartbeat  . A bad rash all over your body  . Dizziness and weakness    Immunizations Administered    Name Date Dose VIS Date Route   Pfizer COVID-19 Vaccine 05/31/2019 12:49 PM 0.3 mL 05/02/2019 Intramuscular   Manufacturer: [place]   Lot: [id number]   [place]: [id number]

## 2019-06-05 ENCOUNTER — Telehealth: Payer: Self-pay | Admitting: Oncology

## 2019-06-05 ENCOUNTER — Encounter: Payer: Self-pay | Admitting: Oncology

## 2019-06-05 NOTE — Telephone Encounter (Signed)
Scheduled appt per 1/14 sch message - pt daughter is aware of appt date and time   

## 2019-06-20 ENCOUNTER — Ambulatory Visit: Payer: PPO

## 2019-06-21 ENCOUNTER — Ambulatory Visit: Payer: PPO | Attending: Internal Medicine

## 2019-06-21 DIAGNOSIS — Z23 Encounter for immunization: Secondary | ICD-10-CM | POA: Insufficient documentation

## 2019-06-21 NOTE — Progress Notes (Signed)
   Covid-19 Vaccination Clinic  Name:  Theresa Wallace    MRN: [id number] DOB: April 03, 1924  06/21/2019  [name] was observed post Covid-19 immunization for 15 minutes without incidence. She was provided with Vaccine Information Sheet and instruction to access the V-Safe system.   [name] was instructed to call 911 with any severe reactions post vaccine: Marland Kitchen Difficulty breathing  . Swelling of your face and throat  . A fast heartbeat  . A bad rash all over your body  . Dizziness and weakness    Immunizations Administered    Name Date Dose VIS Date Route   Pfizer COVID-19 Vaccine 06/21/2019 10:41 AM 0.3 mL 05/02/2019 Intramuscular   Manufacturer: [place]   Lot: [id number]   [place]: [id number]

## 2019-06-23 ENCOUNTER — Other Ambulatory Visit: Payer: Self-pay | Admitting: Oncology

## 2019-06-30 MED FILL — KISQALI 200 MG DAILY DOSE: 200 | 28 days supply | Qty: 21 | Fill #1

## 2019-07-16 DIAGNOSIS — H938X3 Other specified disorders of ear, bilateral: Secondary | ICD-10-CM | POA: Diagnosis not present

## 2019-07-29 MED FILL — KISQALI 200 MG DAILY DOSE: 200 | 28 days supply | Qty: 21 | Fill #2

## 2019-08-22 MED FILL — KISQALI 200 MG DAILY DOSE: 200 | 28 days supply | Qty: 21 | Fill #3

## 2019-08-27 NOTE — Progress Notes (Signed)
ID: Theresa Wallace   DOB: 1924/01/26  MR#: [id number]  TFT#:[id number]   Patient Care Team: [name], MD as PCP - General (Internal Medicine) Skylynne Schlechter, Virgie Dad, MD as Consulting Physician (Oncology) Jolene Provost, PA-C as Consulting Physician (Otolaryngology) OTHER MD:  CHIEF COMPLAINT:  Metastatic Breast Cancer, estrogen receptor positive  CURRENT TREATMENT: letrozole, ribociclib, denosumab/ Delton See   INTERVAL HISTORY: Theresa Wallace returns today for follow-up and treatment of her estrogen receptor positive metastatic breast cancer accompanied by her daughter [name].   Helma continues on letrozole.  She tolerates this well  She also continues on ribociclib, which she takes at 258m daily 3 weeks on and 1 week off.  She is not having diarrhea from this in fact she is occasionally constipated.  They are obtaining it currently at no cost  She last received Xgeva on 02/27/2019.  This has been on hold because of the pandemic  Since her last visit, she has not undergone any additional studies.   REVIEW OF SYSTEMS: RAdannahas had both her vaccines as is everyone in her household.  She had very difficult day with the second dose but recovered nicely.  She eats well and has a steady weight.  Drinking is more of an issue.  She likes cranberry juice and her daughter works around at so she gets at least 3 good glasses of liquid a day, noncaffeine.  The patient is sleeping much more than before.  She has fallen twice.  She does pretty good with a walker however and they are restricting the areas at the house where she can go to minimize the fall risk.  Otherwise a detailed review of systems is remarkably benign.  HISTORY OF PRESENT ILLNESS: From the prior summary:  Theresa Wallace an 86y.o.  [place] woman I saw briefly when she moved to this area in 2007.  [facility] [id number]  It was grade 2, measured 1 cm  maximally and was strongly estrogen and progesterone-receptor positive.  HER-2 was not checked.  This was T1b NX invasive ductal carcinoma, stage I, and she was treated after radiation with anastrozole for several years, eventually switching to Evista primarily for cost reasons.   More recently, she presented to Dr. GInda Merlinwith fatigue and a dry cough.  He auscultated dullness to percussion at the left base and obtained a chest x-ray which confirmed the presence of a left pleural effusion.   Dr. GInda Merlinscheduled RJoseph Artfor an ultrasound-guided left thoracentesis, and this was performed November 09, 2010.  Approximately 1 L of amber-colored fluid was removed.  Cytology from this procedure (([id number] showed only reactive mesothelial cells.   The patient was then referred here and set up for a CT of the chest and a PET scan.  The CT of the chest on June 20th showed 2 slightly enlarged left axillary lymph nodes with some irregular contours but more importantly multiple pleural nodules in the left chest measuring up to 4.6 cm.  The right chest was normal.  There was no pericardial effusion.  There was only a small to moderate left pleural effusion remaining, and aside from left lower lobe collapse or consolidation, there was no evidence of lung parenchymal involvement.  There was no evidence of bone involvement and also no calcified pleural plaques and no worrisome bone involvement.  PET scan on June 27th showed the nodularity along the left pleura to be intensely hypermetabolic.  For example the large lobe nodule measuring 4.5 cm had an SUV max of 12.9.  There was no hypermetabolic mediastinal nodal activity.  The to left axillary lymph nodes were very mildly hypermetabolic.  The left breast was clear.  There was 1 hypermetabolic lesion in the liver measuring 3.2 cm.  On June 27th, Dr. [name] performed fine-needle aspiration of a 7 mm abnormal-appearing lymph node in the lower left axilla.  The cytology from this  procedure, however, ([name]) also was inconclusive showing no evidence of nodal tissue, mostly blood and fatty tissue.   Her subsequent history is as detailed below   PAST MEDICAL HISTORY: Past Medical History:  Diagnosis Date  . Breast cancer metastasized to multiple sites ([place]) 06/07/2011  . Cancer ([place])    breast ca  . GERD (gastroesophageal reflux disease)   . Hypercholesterolemia   . Hypertension   . Irritable bowel syndrome (IBS)   . Onychomycosis   . Sigmoid diverticulosis   Benign tremor. History of palpitations with Holter monitor in 2008 showing PACs.   PAST SURGICAL HISTORY: Past Surgical History:  Procedure Laterality Date  . ABDOMINAL HYSTERECTOMY    . BREAST LUMPECTOMY    . CATARACT EXTRACTION    . CHOLECYSTECTOMY    . KNEE ARTHROSCOPY      FAMILY HISTORY Family History  Problem Relation Age of Onset  . Breast cancer Sister 53  . Breast cancer Cousin        dx in her 51s  . Bone cancer Mother   [name] died in an automobile accident.  [name] died with "bone cancer."  The patient had 3 sisters.  One had breast cancer develop in her 78s.  There is also a cousin with breast cancer but no history of ovarian cancer or other history of cancer in first-degree relatives.   GYNECOLOGIC HISTORY: No LMP recorded. Patient has had a hysterectomy. She is GX, P4, first pregnancy to term at age 86.  She was on the hormone replacement after her hysterectomy but only briefly and stopped when she had the initial diagnosis of breast cancer in 1996. She underwent hysterectomy without BSO.   SOCIAL HISTORY: (Updated May 2018) She worked as a Network engineer.  She has been widowed since 2005, her husband [name] dying after a long illness involving strokes and a myocardial infarction.  Her significant other [name] developed Alzheimer's and died from Covid infection early 2021.. The patient lives with her daughter [name] who used to be a Network engineer for  Dr. Inda Merlin, and Theresa Wallace's husband, Theresa Wallace, who is a physician's assistant with Dr. Durward Fortes.  The patient has 12 grandchildren and 5 great-grandchildren.     ADVANCED DIRECTIVES: In place   HEALTH MAINTENANCE:  Social History   Tobacco Use  . Smoking status: Former Smoker    Quit date: 05/22/1968    Years since quitting: 51.3  . Smokeless tobacco: Never Used  Substance Use Topics  . Alcohol use: Yes    Comment: 2 yearly  . Drug use: No     Colonoscopy:  PAP: s/p remote hysterectomy  Bone density:  10/23/2011, normal  Lipid panel: Dr. Inda Merlin, UTD   Allergies  Allergen Reactions  . Cantaloupe (Diagnostic)   . Ciprofloxacin   . Epinephrine   . Pineapple   . Tamoxifen   . Mupirocin Rash    Current Outpatient Medications  Medication Sig Dispense Refill  . aspirin 81 MG tablet Take 81 mg by mouth See admin instructions. Every  Monday &Thursday    . Calcium Carbonate-Vitamin D (CALCIUM-VITAMIN D) 500-200 MG-UNIT per tablet Take 1 tablet by mouth 2 (two) times daily with a meal.    . cholecalciferol (VITAMIN D) 400 UNITS TABS Take 2,000 Units by mouth daily.    Marland Kitchen esomeprazole (NEXIUM) 20 MG capsule Take 1 capsule (20 mg total) by mouth as needed.    . fluconazole (DIFLUCAN) 150 MG tablet Take 150 mg by mouth as needed.     . gabapentin (NEURONTIN) 100 MG capsule TAKE ONE CAPSULE BY MOUTH EVERY NIGHT AT BEDTIME AND MAY INCREASE TO TWO TIMES A DAY IF NEEDED 60 capsule 0  . ketoconazole (NIZORAL) 2 % cream Apply 1 application topically daily as needed for irritation.     Marland Kitchen ketotifen (ZADITOR) 0.025 % ophthalmic solution Place 1 drop into both eyes as needed (itchy eyes).    Marland Kitchen letrozole (FEMARA) 2.5 MG tablet Take 1 tablet (2.5 mg total) by mouth daily. 90 tablet 4  . levothyroxine (SYNTHROID, LEVOTHROID) 25 MCG tablet Take 25 mcg by mouth daily.    Marland Kitchen losartan (COZAAR) 50 MG tablet Take 50 mg by mouth daily.     . metroNIDAZOLE (METROGEL) 0.75 % gel Apply topically 2 (two)  times daily. For rosacea    . Multiple Vitamin (MULTIVITAMIN) tablet Take 1 tablet by mouth daily.    . propranolol ER (INDERAL LA) 60 MG 24 hr capsule Take 60 mg by mouth at bedtime.     . ribociclib succ (KISQALI 200MG DAILY DOSE) 200 MG Therapy Pack Take 1 tablet (200 mg total) by mouth daily. Take for 21 days on, 7 days off, repeat every 28 days. 21 tablet 6  . ribociclib succ (KISQALI 200MG DAILY DOSE) 200 MG Therapy Pack Take 1 tablet (200 mg total) by mouth daily. Take for 21 days on, 7 days off, repeat every 28 days. 21 tablet 0  . sertraline (ZOLOFT) 50 MG tablet Take 1 tablet (50 mg total) by mouth daily. (Patient not taking: Reported on 03/06/2019) 30 tablet 3   No current facility-administered medications for this visit.    OBJECTIVE: Older white woman examined in a wheelchair Vitals:   08/28/19 1046  BP: 121/65  Pulse: 74  Resp: 18  Temp: 98.3 F (36.8 C)  SpO2: 95%   Body mass index is 28.05 kg/m.  Filed Weights   08/28/19 1046  Weight: 163 lb 6.4 oz (74.1 kg)     Sclerae unicteric, EOMs intact, left ptosis as previously noted Wearing a mask No cervical or supraclavicular adenopathy Lungs no rales or rhonchi Heart regular rate and rhythm Abd soft, nontender, positive bowel sounds MSK no focal spinal tenderness, no upper extremity lymphedema Neuro: nonfocal, pleasant affect Breasts: The right breast is unremarkable.  The left breast is status post lumpectomy, with scar tissue as previously noted.  There is no obvious recurrence.  Both axillae are benign   LAB RESULTS: Lab Results  Component Value Date   WBC 5.5 08/28/2019   NEUTROABS 3.7 08/28/2019   HGB 11.1 (L) 08/28/2019   HCT 33.6 (L) 08/28/2019   MCV 96.6 08/28/2019   PLT 169 08/28/2019      Chemistry      Component Value Date/Time   NA 138 08/28/2019 1033   NA 129 (L) 05/07/2017 1045   K 4.7 08/28/2019 1033   K 5.0 05/07/2017 1045   CL 104 08/28/2019 1033   CL 104 10/24/2012 1014   CO2 26  08/28/2019 1033   CO2  25 05/07/2017 1045   BUN 34 (H) 08/28/2019 1033   BUN 17.6 05/07/2017 1045   CREATININE 1.45 (H) 08/28/2019 1033   CREATININE 1.1 05/07/2017 1045      Component Value Date/Time   CALCIUM 9.4 08/28/2019 1033   CALCIUM 9.3 05/07/2017 1045   ALKPHOS 50 08/28/2019 1033   ALKPHOS 37 (L) 05/07/2017 1045   AST 18 08/28/2019 1033   AST 20 05/07/2017 1045   ALT 15 08/28/2019 1033   ALT 13 05/07/2017 1045   BILITOT 0.9 08/28/2019 1033   BILITOT 1.55 (H) 05/07/2017 1045      IMAGING STUDIES: No results found.    ASSESSMENT: 86 y.o. BRCA negative [place] woman:  (1) Status post left lumpectomy in 1996 for a T1b N0 grade 2 invasive ductal carcinoma which was strongly estrogen and progesterone receptor positive, treated with radiation, then anastrozole, then Evista, all treatment discontinued July 2012  METASTATIC DISEASE: July 2012 (2) recurrence to the lining of the left lung and liver documented by liver biopsy July 2012, showing metastatic adenocarcinoma estrogen and progesterone receptor positive at 96% and 97% respectively; there was no Her-2 amplification.   (a) left axillary lymph node biopsy 09/27/2016 shows breast cancer, 95% estrogen repeat receptor positive with strong staining intensity, progesterone and HER-2 negative.  (3)  She has been on exemestane since early July of 2012, with chest CT scan April 2013 showing complete resolution of her lung and liver metastases  (a) bone density in June of 08/09/2011 was normal  (b) bone density 03/20/2016 was normal with a T score of -0.6  (c) exemestane continued at the time of disease progression noted May 2018  (d) exemestane discontinued April 2019  (4) MRI of the lumbar spine 09-2012 showed significant degenerative disease but no evidence of metastatic disease to bone.  (5) disease progression noted May 2018: CT scan shows liver, lung, and bone lesions  (6) fulvestrant started 09/26/2016, palbociclib added  10/24/2016 at 125 mg daily, 21 days on. 7 days off  (a) palbociclib dose decreased to 118m daily on 02/14/2017  (b) PET scan 10/16/2018 shows progression in the liver no new sites of disease  (c) fulvestrant discontinued; letrozole substituted  (d) palbociclib discontinued, ribociclib substituted  (e) consider consult to interventional radiology for Yttrium treatment of liver lesion  (f) fulvestrant stopped after the 09/23/2018 dose and letrozole started to minimize visits during the pandemic  (7) denosumab/Xgeva started 11/21/2016, repeated every 28 days until 11/19/2017, then every 8 weeks thereafter  (a) Xgeva changed to every 12 weeks beginning with the 05/06/2018 dose  (b) Xgeva held after the October 2020 dose because of the pandemic  (c) denosumab resumed 08/28/2019  (8) Caris requested 10/21/2018, from 09/27/2016 lymph node biopsy  (a) patient is BRCA `1-2 negative   PLAN:  RShandrais now nearly 9 years out from definitive diagnosis of metastatic breast cancer.  Her disease appears to be quite well controlled, and specifically her liver functions today are entirely normal.  We could repeat some scans, but I think in the absence of any obvious symptoms of recurrence and with really unremarkable labs I am happy following her clinically.  We are going to give her XDelton Seetoday and in 6 months.  She will see her primary care physician Dr. GInda Merlinin about 3 months then they will repeat labs at that time as well.  They do have an out of facility DNR in place the daughter tells me.  Clearly RSandyis declining steadily although  slowly and I am delighted that she at this point still has a good quality of life, reading, and watching the [employer] channel  They know to call for any other issue that may develop before the next visit  Total encounter time 30 minutes.Sarajane Jews C. Kimberlye Dilger, MD 08/28/19 11:29 AM Medical Oncology and Hematology [facility] [place], [place] [address] Tel. [phone]    Fax. [phone]   I, Wilburn Mylar, am acting as scribe for Dr. Virgie Dad. Daejon Lich.  I, Lurline Del MD, have reviewed the above documentation for accuracy and completeness, and I agree with the above.   *Total Encounter Time as defined by the Centers for Medicare and Medicaid Services includes, in addition to the face-to-face time of a patient visit (documented in the note above) non-face-to-face time: obtaining and reviewing outside history, ordering and reviewing medications, tests or procedures, care coordination (communications with other health care professionals or caregivers) and documentation in the medical record.

## 2019-08-28 ENCOUNTER — Other Ambulatory Visit: Payer: Self-pay

## 2019-08-28 ENCOUNTER — Inpatient Hospital Stay: Payer: PPO | Attending: Oncology | Admitting: Oncology

## 2019-08-28 ENCOUNTER — Inpatient Hospital Stay: Payer: PPO

## 2019-08-28 VITALS — BP 121/65 | HR 74 | Temp 98.3°F | Resp 18 | Ht 64.0 in | Wt 163.4 lb

## 2019-08-28 DIAGNOSIS — K219 Gastro-esophageal reflux disease without esophagitis: Secondary | ICD-10-CM | POA: Insufficient documentation

## 2019-08-28 DIAGNOSIS — J90 Pleural effusion, not elsewhere classified: Secondary | ICD-10-CM | POA: Diagnosis not present

## 2019-08-28 DIAGNOSIS — Z170 Estrogen receptor positive status [ER+]: Secondary | ICD-10-CM

## 2019-08-28 DIAGNOSIS — E7800 Pure hypercholesterolemia, unspecified: Secondary | ICD-10-CM | POA: Insufficient documentation

## 2019-08-28 DIAGNOSIS — Z7982 Long term (current) use of aspirin: Secondary | ICD-10-CM | POA: Diagnosis not present

## 2019-08-28 DIAGNOSIS — C50812 Malignant neoplasm of overlapping sites of left female breast: Secondary | ICD-10-CM

## 2019-08-28 DIAGNOSIS — C50912 Malignant neoplasm of unspecified site of left female breast: Secondary | ICD-10-CM

## 2019-08-28 DIAGNOSIS — Z79899 Other long term (current) drug therapy: Secondary | ICD-10-CM | POA: Insufficient documentation

## 2019-08-28 DIAGNOSIS — C50919 Malignant neoplasm of unspecified site of unspecified female breast: Secondary | ICD-10-CM

## 2019-08-28 DIAGNOSIS — Z803 Family history of malignant neoplasm of breast: Secondary | ICD-10-CM | POA: Insufficient documentation

## 2019-08-28 DIAGNOSIS — Z7189 Other specified counseling: Secondary | ICD-10-CM

## 2019-08-28 DIAGNOSIS — Z79811 Long term (current) use of aromatase inhibitors: Secondary | ICD-10-CM | POA: Insufficient documentation

## 2019-08-28 DIAGNOSIS — K589 Irritable bowel syndrome without diarrhea: Secondary | ICD-10-CM | POA: Insufficient documentation

## 2019-08-28 DIAGNOSIS — I10 Essential (primary) hypertension: Secondary | ICD-10-CM

## 2019-08-28 DIAGNOSIS — C50922 Malignant neoplasm of unspecified site of left male breast: Secondary | ICD-10-CM

## 2019-08-28 DIAGNOSIS — J410 Simple chronic bronchitis: Secondary | ICD-10-CM

## 2019-08-28 DIAGNOSIS — Z87891 Personal history of nicotine dependence: Secondary | ICD-10-CM | POA: Diagnosis not present

## 2019-08-28 LAB — CBC WITH DIFFERENTIAL (CANCER CENTER ONLY)
Abs Immature Granulocytes: 0.03 10*3/uL (ref 0.00–0.07)
Basophils Absolute: 0.1 10*3/uL (ref 0.0–0.1)
Basophils Relative: 1 %
Eosinophils Absolute: 0.1 10*3/uL (ref 0.0–0.5)
Eosinophils Relative: 2 %
HCT: 33.6 % — ABNORMAL LOW (ref 36.0–46.0)
Hemoglobin: 11.1 g/dL — ABNORMAL LOW (ref 12.0–15.0)
Immature Granulocytes: 1 %
Lymphocytes Relative: 19 %
Lymphs Abs: 1 10*3/uL (ref 0.7–4.0)
MCH: 31.9 pg (ref 26.0–34.0)
MCHC: 33 g/dL (ref 30.0–36.0)
MCV: 96.6 fL (ref 80.0–100.0)
Monocytes Absolute: 0.5 10*3/uL (ref 0.1–1.0)
Monocytes Relative: 10 %
Neutro Abs: 3.7 10*3/uL (ref 1.7–7.7)
Neutrophils Relative %: 67 %
Platelet Count: 169 10*3/uL (ref 150–400)
RBC: 3.48 MIL/uL — ABNORMAL LOW (ref 3.87–5.11)
RDW: 13.5 % (ref 11.5–15.5)
WBC Count: 5.5 10*3/uL (ref 4.0–10.5)
nRBC: 0 % (ref 0.0–0.2)

## 2019-08-28 LAB — CMP (CANCER CENTER ONLY)
ALT: 15 U/L (ref 0–44)
AST: 18 U/L (ref 15–41)
Albumin: 3.7 g/dL (ref 3.5–5.0)
Alkaline Phosphatase: 50 U/L (ref 38–126)
Anion gap: 8 (ref 5–15)
BUN: 34 mg/dL — ABNORMAL HIGH (ref 8–23)
CO2: 26 mmol/L (ref 22–32)
Calcium: 9.4 mg/dL (ref 8.9–10.3)
Chloride: 104 mmol/L (ref 98–111)
Creatinine: 1.45 mg/dL — ABNORMAL HIGH (ref 0.44–1.00)
GFR, Est AFR Am: 36 mL/min — ABNORMAL LOW (ref >60)
GFR, Estimated: 31 mL/min — ABNORMAL LOW (ref >60)
Glucose, Bld: 112 mg/dL — ABNORMAL HIGH (ref 70–99)
Potassium: 4.7 mmol/L (ref 3.5–5.1)
Sodium: 138 mmol/L (ref 135–145)
Total Bilirubin: 0.9 mg/dL (ref 0.3–1.2)
Total Protein: 6.8 g/dL (ref 6.5–8.1)

## 2019-08-28 MED ORDER — DENOSUMAB 120 MG/1.7ML ~~LOC~~ SOLN
120.0000 mg | Freq: Once | SUBCUTANEOUS | Status: AC
  Administered 2019-08-28: 120 mg via SUBCUTANEOUS

## 2019-08-28 MED ORDER — DENOSUMAB 120 MG/1.7ML ~~LOC~~ SOLN
SUBCUTANEOUS | Status: AC
  Filled 2019-08-28: qty 1.7

## 2019-08-28 NOTE — Patient Instructions (Signed)
Denosumab injection What is this medicine? DENOSUMAB (den oh sue mab) slows bone breakdown. Prolia is used to treat osteoporosis in women after menopause and in men, and in people who are taking corticosteroids for 6 months or more. Xgeva is used to treat a high calcium level due to cancer and to prevent bone fractures and other bone problems caused by multiple myeloma or cancer bone metastases. Xgeva is also used to treat giant cell tumor of the bone. This medicine may be used for other purposes; ask your health care provider or pharmacist if you have questions. COMMON BRAND NAME(S): Prolia, XGEVA What should I tell my health care provider before I take this medicine? They need to know if you have any of these conditions:  dental disease  having surgery or tooth extraction  infection  kidney disease  low levels of calcium or Vitamin D in the blood  malnutrition  on hemodialysis  skin conditions or sensitivity  thyroid or parathyroid disease  an unusual reaction to denosumab, other medicines, foods, dyes, or preservatives  pregnant or trying to get pregnant  breast-feeding How should I use this medicine? This medicine is for injection under the skin. It is given by a health care professional in a hospital or clinic setting. A special MedGuide will be given to you before each treatment. Be sure to read this information carefully each time. For Prolia, talk to your pediatrician regarding the use of this medicine in children. Special care may be needed. For Xgeva, talk to your pediatrician regarding the use of this medicine in children. While this drug may be prescribed for children as young as 13 years for selected conditions, precautions do apply. Overdosage: If you think you have taken too much of this medicine contact a poison control center or emergency room at once. NOTE: This medicine is only for you. Do not share this medicine with others. What if I miss a dose? It is  important not to miss your dose. Call your doctor or health care professional if you are unable to keep an appointment. What may interact with this medicine? Do not take this medicine with any of the following medications:  other medicines containing denosumab This medicine may also interact with the following medications:  medicines that lower your chance of fighting infection  steroid medicines like prednisone or cortisone This list may not describe all possible interactions. Give your health care provider a list of all the medicines, herbs, non-prescription drugs, or dietary supplements you use. Also tell them if you smoke, drink alcohol, or use illegal drugs. Some items may interact with your medicine. What should I watch for while using this medicine? Visit your doctor or health care professional for regular checks on your progress. Your doctor or health care professional may order blood tests and other tests to see how you are doing. Call your doctor or health care professional for advice if you get a fever, chills or sore throat, or other symptoms of a cold or flu. Do not treat yourself. This drug may decrease your body's ability to fight infection. Try to avoid being around people who are sick. You should make sure you get enough calcium and vitamin D while you are taking this medicine, unless your doctor tells you not to. Discuss the foods you eat and the vitamins you take with your health care professional. See your dentist regularly. Brush and floss your teeth as directed. Before you have any dental work done, tell your dentist you are   receiving this medicine. Do not become pregnant while taking this medicine or for 5 months after stopping it. Talk with your doctor or health care professional about your birth control options while taking this medicine. Women should inform their doctor if they wish to become pregnant or think they might be pregnant. There is a potential for serious side  effects to an unborn child. Talk to your health care professional or pharmacist for more information. What side effects may I notice from receiving this medicine? Side effects that you should report to your doctor or health care professional as soon as possible:  allergic reactions like skin rash, itching or hives, swelling of the face, lips, or tongue  bone pain  breathing problems  dizziness  jaw pain, especially after dental work  redness, blistering, peeling of the skin  signs and symptoms of infection like fever or chills; cough; sore throat; pain or trouble passing urine  signs of low calcium like fast heartbeat, muscle cramps or muscle pain; pain, tingling, numbness in the hands or feet; seizures  unusual bleeding or bruising  unusually weak or tired Side effects that usually do not require medical attention (report to your doctor or health care professional if they continue or are bothersome):  constipation  diarrhea  headache  joint pain  loss of appetite  muscle pain  runny nose  tiredness  upset stomach This list may not describe all possible side effects. Call your doctor for medical advice about side effects. You may report side effects to FDA at 1-800-FDA-1088. Where should I keep my medicine? This medicine is only given in a clinic, doctor's office, or other health care setting and will not be stored at home. NOTE: This sheet is a summary. It may not cover all possible information. If you have questions about this medicine, talk to your doctor, pharmacist, or health care provider.  2020 Elsevier/Gold Standard (2017-09-14 16:10:44)

## 2019-08-29 ENCOUNTER — Telehealth: Payer: Self-pay | Admitting: Oncology

## 2019-08-29 LAB — CANCER ANTIGEN 27.29: CA 27.29: 256.5 U/mL — ABNORMAL HIGH (ref 0.0–38.6)

## 2019-08-29 NOTE — Telephone Encounter (Signed)
Scheduled appts per 4/8 los. Pt's daughter confirmed appt date and time.

## 2019-08-30 ENCOUNTER — Other Ambulatory Visit: Payer: Self-pay | Admitting: Oncology

## 2019-09-04 DIAGNOSIS — H6122 Impacted cerumen, left ear: Secondary | ICD-10-CM | POA: Diagnosis not present

## 2019-09-04 DIAGNOSIS — H6121 Impacted cerumen, right ear: Secondary | ICD-10-CM | POA: Diagnosis not present

## 2019-09-04 DIAGNOSIS — H6123 Impacted cerumen, bilateral: Secondary | ICD-10-CM | POA: Diagnosis not present

## 2019-09-18 DIAGNOSIS — L89509 Pressure ulcer of unspecified ankle, unspecified stage: Secondary | ICD-10-CM | POA: Diagnosis not present

## 2019-09-18 MED FILL — KISQALI 200 MG DAILY DOSE: 200 | 28 days supply | Qty: 21 | Fill #4

## 2019-09-30 DIAGNOSIS — H903 Sensorineural hearing loss, bilateral: Secondary | ICD-10-CM | POA: Diagnosis not present

## 2019-10-07 ENCOUNTER — Encounter: Payer: Self-pay | Admitting: Orthopedic Surgery

## 2019-10-07 ENCOUNTER — Ambulatory Visit: Payer: PPO | Admitting: Orthopedic Surgery

## 2019-10-07 ENCOUNTER — Other Ambulatory Visit: Payer: Self-pay

## 2019-10-07 VITALS — Ht 64.0 in | Wt 163.0 lb

## 2019-10-07 DIAGNOSIS — L97321 Non-pressure chronic ulcer of left ankle limited to breakdown of skin: Secondary | ICD-10-CM

## 2019-10-07 MED ORDER — PENTOXIFYLLINE ER 400 MG PO TBCR: 400.0000 mg | EXTENDED_RELEASE_TABLET | Freq: Three times a day (TID) | ORAL | 3 refills | Status: DC

## 2019-10-07 MED ORDER — NITROGLYCERIN 0.2 MG/HR TD PT24: 0.2000 mg | MEDICATED_PATCH | Freq: Every day | TRANSDERMAL | 12 refills | Status: DC

## 2019-10-07 NOTE — Progress Notes (Signed)
Office Visit Note   Patient: Theresa Wallace           Date of Birth: 05/08/1924           MRN: [id number] Visit Date: 10/07/2019              Requested by: [name], MD [address] [place] [place],  [place] [address] PCP: [name], MD  Chief Complaint  Patient presents with  . Left Ankle - Pain      HPI: Patient is a 86 year old woman who has had a few episodes of trauma she slid out of her wheelchair and fell and patient has developed an ulcer over the lateral malleolus left ankle.  Patient also sleeps lying on the left side.  She does elevate her leg during the day.  Patient has completed a 10-day course of Keflex.  Patient is on treatment for metastatic breast cancer.  Assessment & Plan: Visit Diagnoses:  1. Lower limb ulcer, ankle, left, limited to breakdown of skin ([place])     Plan: A prescription was written for a medical compression stocking to be worn around-the-clock size medium.  A prescription is written for a nitroglycerin patch to help improve the microcirculation a prescription for Trental to also improve microcirculation and she was placed in a PRAFO boot with the kickstand laterally to unload any potential pressure.  Follow-Up Instructions: No follow-ups on file.   Ortho Exam  Patient is alert, oriented, no adenopathy, well-dressed, normal affect, normal respiratory effort. On examination patient does have some mild venous insufficiency with mild swelling she has a ischemic ulcer over the lateral malleolus that is 5 mm in diameter and appears superficial there is no surrounding cellulitis she has a palpable dorsalis pedis pulse with no arterial insufficiency of the vessels to the ankle.  There are no plantar ulcers.  Imaging: No results found. No images are attached to the encounter.  Labs: Lab Results  Component Value Date   REPTSTATUS 04/12/2019 FINAL 04/10/2019   GRAMSTAIN NO WBC SEEN NO ORGANISMS SEEN 11/09/2010   CULT (A) 04/10/2019      <10,000 COLONIES/mL INSIGNIFICANT GROWTH Performed at [facility], [place] [address] [place], [place] [address]      Lab Results  Component Value Date   ALBUMIN 3.7 08/28/2019   ALBUMIN 4.0 04/10/2019   ALBUMIN 4.0 03/06/2019    Lab Results  Component Value Date   MG 1.7 03/06/2019   No results found for: VD25OH  No results found for: PREALBUMIN CBC EXTENDED Latest Ref Rng & Units 08/28/2019 04/10/2019 03/06/2019  WBC 4.0 - 10.5 K/uL 5.5 6.3 5.0  RBC 3.87 - 5.11 MIL/uL 3.48(L) 3.90 3.46(L)  HGB 12.0 - 15.0 g/dL 11.1(L) 12.3 10.8(L)  HCT 36.0 - 46.0 % 33.6(L) 36.7 31.1(L)  PLT 150 - 400 K/uL 169 162 177  NEUTROABS 1.7 - 7.7 K/uL 3.7 4.2 3.9  LYMPHSABS 0.7 - 4.0 K/uL 1.0 1.2 0.7     Body mass index is 27.98 kg/m.  Orders:  No orders of the defined types were placed in this encounter.  No orders of the defined types were placed in this encounter.    Procedures: No procedures performed  Clinical Data: No additional findings.  ROS:  All other systems negative, except as noted in the HPI. Review of Systems  Objective: Vital Signs: Ht 5\' 4"  (1.626 m)   Wt 163 lb (73.9 kg)   BMI 27.98 kg/m   Specialty Comments:  No specialty  comments available.  PMFS History: Patient Active Problem List   Diagnosis Date Noted  . CKD (chronic kidney disease), stage III 04/03/2018  . Essential hypertension 04/03/2018  . Hyponatremia 04/01/2018  . Goals of care, counseling/discussion 08/27/2017  . Malignant neoplasm of overlapping sites of left breast in female, estrogen receptor positive ([place]) 09/11/2016  . Cancer of left breast ([place]) 09/13/2015  . Sensorineural hearing loss (SNHL), bilateral 08/23/2015  . Bilateral impacted cerumen 08/23/2015  . Genetic testing 12/18/2014  . COPD (chronic obstructive pulmonary disease) ([place]) 09/07/2014  . Breast cancer metastasized to multiple sites [facility]) 06/07/2011   Past Medical History:  Diagnosis Date  . Breast cancer  metastasized to multiple sites ([place]) 06/07/2011  . Cancer ([place])    breast ca  . GERD (gastroesophageal reflux disease)   . Hypercholesterolemia   . Hypertension   . Irritable bowel syndrome (IBS)   . Onychomycosis   . Sigmoid diverticulosis     Family History  Problem Relation Age of Onset  . Breast cancer Sister 66  . Breast cancer Cousin        dx in her 75s  . Bone cancer Mother     Past Surgical History:  Procedure Laterality Date  . ABDOMINAL HYSTERECTOMY    . BREAST LUMPECTOMY    . CATARACT EXTRACTION    . CHOLECYSTECTOMY    . KNEE ARTHROSCOPY     Social History   Occupational History  . Not on file  Tobacco Use  . Smoking status: Former Smoker    Quit date: 05/22/1968    Years since quitting: 51.4  . Smokeless tobacco: Never Used  Substance and Sexual Activity  . Alcohol use: Yes    Comment: 2 yearly  . Drug use: No  . Sexual activity: Not on file

## 2019-10-13 DIAGNOSIS — E782 Mixed hyperlipidemia: Secondary | ICD-10-CM | POA: Diagnosis not present

## 2019-10-13 DIAGNOSIS — C50912 Malignant neoplasm of unspecified site of left female breast: Secondary | ICD-10-CM | POA: Diagnosis not present

## 2019-10-13 DIAGNOSIS — C50919 Malignant neoplasm of unspecified site of unspecified female breast: Secondary | ICD-10-CM | POA: Diagnosis not present

## 2019-10-13 DIAGNOSIS — F4321 Adjustment disorder with depressed mood: Secondary | ICD-10-CM | POA: Diagnosis not present

## 2019-10-13 DIAGNOSIS — J45991 Cough variant asthma: Secondary | ICD-10-CM | POA: Diagnosis not present

## 2019-10-13 DIAGNOSIS — E039 Hypothyroidism, unspecified: Secondary | ICD-10-CM | POA: Diagnosis not present

## 2019-10-13 DIAGNOSIS — I10 Essential (primary) hypertension: Secondary | ICD-10-CM | POA: Diagnosis not present

## 2019-10-16 DIAGNOSIS — H6123 Impacted cerumen, bilateral: Secondary | ICD-10-CM | POA: Diagnosis not present

## 2019-10-16 MED FILL — KISQALI 200 MG DAILY DOSE: 200 | 28 days supply | Qty: 21 | Fill #5

## 2019-10-21 ENCOUNTER — Encounter: Payer: Self-pay | Admitting: Orthopedic Surgery

## 2019-10-21 ENCOUNTER — Ambulatory Visit: Payer: PPO | Admitting: Orthopedic Surgery

## 2019-10-21 ENCOUNTER — Other Ambulatory Visit: Payer: Self-pay

## 2019-10-21 VITALS — Ht 64.0 in | Wt 163.0 lb

## 2019-10-21 DIAGNOSIS — L97321 Non-pressure chronic ulcer of left ankle limited to breakdown of skin: Secondary | ICD-10-CM | POA: Diagnosis not present

## 2019-11-03 ENCOUNTER — Encounter: Payer: Self-pay | Admitting: Orthopedic Surgery

## 2019-11-03 DIAGNOSIS — J45991 Cough variant asthma: Secondary | ICD-10-CM | POA: Diagnosis not present

## 2019-11-03 DIAGNOSIS — C50912 Malignant neoplasm of unspecified site of left female breast: Secondary | ICD-10-CM | POA: Diagnosis not present

## 2019-11-03 DIAGNOSIS — F4321 Adjustment disorder with depressed mood: Secondary | ICD-10-CM | POA: Diagnosis not present

## 2019-11-03 DIAGNOSIS — E782 Mixed hyperlipidemia: Secondary | ICD-10-CM | POA: Diagnosis not present

## 2019-11-03 DIAGNOSIS — C50919 Malignant neoplasm of unspecified site of unspecified female breast: Secondary | ICD-10-CM | POA: Diagnosis not present

## 2019-11-03 DIAGNOSIS — E039 Hypothyroidism, unspecified: Secondary | ICD-10-CM | POA: Diagnosis not present

## 2019-11-03 DIAGNOSIS — I10 Essential (primary) hypertension: Secondary | ICD-10-CM | POA: Diagnosis not present

## 2019-11-03 NOTE — Progress Notes (Signed)
Office Visit Note   Patient: Theresa Wallace           Date of Birth: 11-08-1923           MRN: [id number] Visit Date: 10/21/2019              Requested by: [name], MD [address] [place] [address] [place],  [place] [address] PCP: [name], MD  Chief Complaint  Patient presents with  . Left Ankle - Follow-up    F/u lateral ankle ulcer       HPI: Patient is a 86 year old woman who is seen in follow-up for lateral malleolar left ankle ulcer.  She is currently wearing compression socks she has been using a nitroglycerin patch Trental and a PRAFO.  Patient states that her ankle is still tender to touch takes Tylenol for pain denies pain with weightbearing.  Assessment & Plan: Visit Diagnoses:  1. Lower limb ulcer, ankle, left, limited to breakdown of skin ([place])     Plan: Recommended patient advance to regular shoewear wearing the compression socks daily  Follow-Up Instructions: Return if symptoms worsen or fail to improve.   Ortho Exam  Patient is alert, oriented, no adenopathy, well-dressed, normal affect, normal respiratory effort. Examination the ulcer is scabbed over there is no cellulitis no drainage no signs of infection the scabbed area is 5 mm in diameter.  Imaging: No results found. No images are attached to the encounter.  Labs: Lab Results  Component Value Date   REPTSTATUS 04/12/2019 FINAL 04/10/2019   GRAMSTAIN NO WBC SEEN NO ORGANISMS SEEN 11/09/2010   CULT (A) 04/10/2019    <10,000 COLONIES/mL INSIGNIFICANT GROWTH Performed at [facility], [place] [address] [place], [place] [address]      Lab Results  Component Value Date   ALBUMIN 3.7 08/28/2019   ALBUMIN 4.0 04/10/2019   ALBUMIN 4.0 03/06/2019    Lab Results  Component Value Date   MG 1.7 03/06/2019   No results found for: VD25OH  No results found for: PREALBUMIN CBC EXTENDED Latest Ref Rng & Units 08/28/2019 04/10/2019 03/06/2019  WBC 4.0 - 10.5 K/uL 5.5 6.3 5.0  RBC 3.87 -  5.11 MIL/uL 3.48(L) 3.90 3.46(L)  HGB 12.0 - 15.0 g/dL 11.1(L) 12.3 10.8(L)  HCT 36 - 46 % 33.6(L) 36.7 31.1(L)  PLT 150 - 400 K/uL 169 162 177  NEUTROABS 1.7 - 7.7 K/uL 3.7 4.2 3.9  LYMPHSABS 0.7 - 4.0 K/uL 1.0 1.2 0.7     Body mass index is 27.98 kg/m.  Orders:  No orders of the defined types were placed in this encounter.  No orders of the defined types were placed in this encounter.    Procedures: No procedures performed  Clinical Data: No additional findings.  ROS:  All other systems negative, except as noted in the HPI. Review of Systems  Objective: Vital Signs: Ht 5\' 4"  (1.626 m)   Wt 163 lb (73.9 kg)   BMI 27.98 kg/m   Specialty Comments:  No specialty comments available.  PMFS History: Patient Active Problem List   Diagnosis Date Noted  . CKD (chronic kidney disease), stage III 04/03/2018  . Essential hypertension 04/03/2018  . Hyponatremia 04/01/2018  . Goals of care, counseling/discussion 08/27/2017  . Malignant neoplasm of overlapping sites of left breast in female, estrogen receptor positive ([place]) 09/11/2016  . Cancer of left breast ([place]) 09/13/2015  . Sensorineural hearing loss (SNHL), bilateral 08/23/2015  . Bilateral impacted cerumen 08/23/2015  . Genetic testing  12/18/2014  . COPD (chronic obstructive pulmonary disease) ([place]) 09/07/2014  . Breast cancer metastasized to multiple sites [facility]) 06/07/2011   Past Medical History:  Diagnosis Date  . Breast cancer metastasized to multiple sites ([place]) 06/07/2011  . Cancer ([place])    breast ca  . GERD (gastroesophageal reflux disease)   . Hypercholesterolemia   . Hypertension   . Irritable bowel syndrome (IBS)   . Onychomycosis   . Sigmoid diverticulosis     Family History  Problem Relation Age of Onset  . Breast cancer Sister 51  . Breast cancer Cousin        dx in her 80s  . Bone cancer Mother     Past Surgical History:  Procedure Laterality Date  . ABDOMINAL HYSTERECTOMY    . BREAST  LUMPECTOMY    . CATARACT EXTRACTION    . CHOLECYSTECTOMY    . KNEE ARTHROSCOPY     Social History   Occupational History  . Not on file  Tobacco Use  . Smoking status: Former Smoker    Quit date: 05/22/1968    Years since quitting: 51.4  . Smokeless tobacco: Never Used  Vaping Use  . Vaping Use: Never used  Substance and Sexual Activity  . Alcohol use: Yes    Comment: 2 yearly  . Drug use: No  . Sexual activity: Not on file

## 2019-11-05 DIAGNOSIS — H903 Sensorineural hearing loss, bilateral: Secondary | ICD-10-CM | POA: Diagnosis not present

## 2019-11-17 MED FILL — KISQALI 200 MG DAILY DOSE: 200 | 28 days supply | Qty: 21 | Fill #6

## 2019-11-27 DIAGNOSIS — H938X3 Other specified disorders of ear, bilateral: Secondary | ICD-10-CM | POA: Diagnosis not present

## 2019-12-10 ENCOUNTER — Other Ambulatory Visit: Payer: Self-pay | Admitting: Oncology

## 2019-12-10 DIAGNOSIS — C50012 Malignant neoplasm of nipple and areola, left female breast: Secondary | ICD-10-CM

## 2019-12-15 MED FILL — KISQALI 200 MG DAILY DOSE: 200 | 28 days supply | Qty: 21 | Fill #0

## 2019-12-29 ENCOUNTER — Other Ambulatory Visit: Payer: Self-pay | Admitting: Oncology

## 2020-01-08 ENCOUNTER — Encounter: Payer: Self-pay | Admitting: Genetic Counselor

## 2020-01-13 MED FILL — KISQALI 200 MG DAILY DOSE: 200 | 28 days supply | Qty: 21 | Fill #1

## 2020-01-15 DIAGNOSIS — H938X3 Other specified disorders of ear, bilateral: Secondary | ICD-10-CM | POA: Diagnosis not present

## 2020-01-23 DIAGNOSIS — F4321 Adjustment disorder with depressed mood: Secondary | ICD-10-CM | POA: Diagnosis not present

## 2020-01-23 DIAGNOSIS — C50912 Malignant neoplasm of unspecified site of left female breast: Secondary | ICD-10-CM | POA: Diagnosis not present

## 2020-01-23 DIAGNOSIS — E782 Mixed hyperlipidemia: Secondary | ICD-10-CM | POA: Diagnosis not present

## 2020-01-23 DIAGNOSIS — C50919 Malignant neoplasm of unspecified site of unspecified female breast: Secondary | ICD-10-CM | POA: Diagnosis not present

## 2020-01-23 DIAGNOSIS — I10 Essential (primary) hypertension: Secondary | ICD-10-CM | POA: Diagnosis not present

## 2020-01-23 DIAGNOSIS — E039 Hypothyroidism, unspecified: Secondary | ICD-10-CM | POA: Diagnosis not present

## 2020-01-23 DIAGNOSIS — J45991 Cough variant asthma: Secondary | ICD-10-CM | POA: Diagnosis not present

## 2020-02-09 MED FILL — KISQALI 200 MG DAILY DOSE: 200 | 28 days supply | Qty: 21 | Fill #2

## 2020-02-26 DIAGNOSIS — H938X3 Other specified disorders of ear, bilateral: Secondary | ICD-10-CM | POA: Diagnosis not present

## 2020-03-04 ENCOUNTER — Other Ambulatory Visit: Payer: Self-pay | Admitting: Oncology

## 2020-03-08 MED FILL — KISQALI 200 MG DAILY DOSE: 200 | 28 days supply | Qty: 21 | Fill #3

## 2020-03-09 ENCOUNTER — Other Ambulatory Visit: Payer: Self-pay | Admitting: *Deleted

## 2020-03-09 DIAGNOSIS — C50812 Malignant neoplasm of overlapping sites of left female breast: Secondary | ICD-10-CM

## 2020-03-09 DIAGNOSIS — Z170 Estrogen receptor positive status [ER+]: Secondary | ICD-10-CM

## 2020-03-11 ENCOUNTER — Inpatient Hospital Stay: Payer: PPO | Attending: Oncology

## 2020-03-11 ENCOUNTER — Other Ambulatory Visit: Payer: Self-pay

## 2020-03-11 ENCOUNTER — Inpatient Hospital Stay: Payer: PPO | Admitting: Oncology

## 2020-03-11 ENCOUNTER — Inpatient Hospital Stay: Payer: PPO

## 2020-03-11 ENCOUNTER — Other Ambulatory Visit: Payer: Self-pay | Admitting: Oncology

## 2020-03-11 VITALS — BP 151/92 | HR 73 | Temp 97.4°F | Resp 18 | Ht 64.0 in | Wt 163.2 lb

## 2020-03-11 DIAGNOSIS — I10 Essential (primary) hypertension: Secondary | ICD-10-CM | POA: Diagnosis not present

## 2020-03-11 DIAGNOSIS — C50919 Malignant neoplasm of unspecified site of unspecified female breast: Secondary | ICD-10-CM

## 2020-03-11 DIAGNOSIS — R590 Localized enlarged lymph nodes: Secondary | ICD-10-CM | POA: Diagnosis not present

## 2020-03-11 DIAGNOSIS — Z87891 Personal history of nicotine dependence: Secondary | ICD-10-CM | POA: Diagnosis not present

## 2020-03-11 DIAGNOSIS — Z23 Encounter for immunization: Secondary | ICD-10-CM

## 2020-03-11 DIAGNOSIS — R5383 Other fatigue: Secondary | ICD-10-CM | POA: Diagnosis not present

## 2020-03-11 DIAGNOSIS — C50912 Malignant neoplasm of unspecified site of left female breast: Secondary | ICD-10-CM

## 2020-03-11 DIAGNOSIS — C50922 Malignant neoplasm of unspecified site of left male breast: Secondary | ICD-10-CM

## 2020-03-11 DIAGNOSIS — R059 Cough, unspecified: Secondary | ICD-10-CM | POA: Diagnosis not present

## 2020-03-11 DIAGNOSIS — Z79899 Other long term (current) drug therapy: Secondary | ICD-10-CM | POA: Diagnosis not present

## 2020-03-11 DIAGNOSIS — Z803 Family history of malignant neoplasm of breast: Secondary | ICD-10-CM | POA: Diagnosis not present

## 2020-03-11 DIAGNOSIS — C787 Secondary malignant neoplasm of liver and intrahepatic bile duct: Secondary | ICD-10-CM | POA: Diagnosis not present

## 2020-03-11 DIAGNOSIS — J90 Pleural effusion, not elsewhere classified: Secondary | ICD-10-CM | POA: Insufficient documentation

## 2020-03-11 DIAGNOSIS — Z881 Allergy status to other antibiotic agents status: Secondary | ICD-10-CM | POA: Diagnosis not present

## 2020-03-11 DIAGNOSIS — Z170 Estrogen receptor positive status [ER+]: Secondary | ICD-10-CM | POA: Diagnosis not present

## 2020-03-11 DIAGNOSIS — C7802 Secondary malignant neoplasm of left lung: Secondary | ICD-10-CM | POA: Insufficient documentation

## 2020-03-11 DIAGNOSIS — J410 Simple chronic bronchitis: Secondary | ICD-10-CM

## 2020-03-11 DIAGNOSIS — Z9049 Acquired absence of other specified parts of digestive tract: Secondary | ICD-10-CM | POA: Diagnosis not present

## 2020-03-11 DIAGNOSIS — C50812 Malignant neoplasm of overlapping sites of left female breast: Secondary | ICD-10-CM

## 2020-03-11 DIAGNOSIS — Z79811 Long term (current) use of aromatase inhibitors: Secondary | ICD-10-CM | POA: Insufficient documentation

## 2020-03-11 DIAGNOSIS — Z923 Personal history of irradiation: Secondary | ICD-10-CM | POA: Insufficient documentation

## 2020-03-11 DIAGNOSIS — C7951 Secondary malignant neoplasm of bone: Secondary | ICD-10-CM | POA: Diagnosis not present

## 2020-03-11 LAB — CBC WITH DIFFERENTIAL (CANCER CENTER ONLY)
Abs Immature Granulocytes: 0.03 10*3/uL (ref 0.00–0.07)
Basophils Absolute: 0.1 10*3/uL (ref 0.0–0.1)
Basophils Relative: 1 %
Eosinophils Absolute: 0.2 10*3/uL (ref 0.0–0.5)
Eosinophils Relative: 3 %
HCT: 32.9 % — ABNORMAL LOW (ref 36.0–46.0)
Hemoglobin: 10.8 g/dL — ABNORMAL LOW (ref 12.0–15.0)
Immature Granulocytes: 1 %
Lymphocytes Relative: 19 %
Lymphs Abs: 1.1 10*3/uL (ref 0.7–4.0)
MCH: 31.1 pg (ref 26.0–34.0)
MCHC: 32.8 g/dL (ref 30.0–36.0)
MCV: 94.8 fL (ref 80.0–100.0)
Monocytes Absolute: 0.6 10*3/uL (ref 0.1–1.0)
Monocytes Relative: 10 %
Neutro Abs: 3.8 10*3/uL (ref 1.7–7.7)
Neutrophils Relative %: 66 %
Platelet Count: 184 10*3/uL (ref 150–400)
RBC: 3.47 MIL/uL — ABNORMAL LOW (ref 3.87–5.11)
RDW: 13.5 % (ref 11.5–15.5)
WBC Count: 5.7 10*3/uL (ref 4.0–10.5)
nRBC: 0 % (ref 0.0–0.2)

## 2020-03-11 LAB — CMP (CANCER CENTER ONLY)
ALT: 12 U/L (ref 0–44)
AST: 18 U/L (ref 15–41)
Albumin: 3.7 g/dL (ref 3.5–5.0)
Alkaline Phosphatase: 56 U/L (ref 38–126)
Anion gap: 7 (ref 5–15)
BUN: 33 mg/dL — ABNORMAL HIGH (ref 8–23)
CO2: 26 mmol/L (ref 22–32)
Calcium: 9.8 mg/dL (ref 8.9–10.3)
Chloride: 107 mmol/L (ref 98–111)
Creatinine: 1.39 mg/dL — ABNORMAL HIGH (ref 0.44–1.00)
GFR, Estimated: 35 mL/min — ABNORMAL LOW (ref >60)
Glucose, Bld: 113 mg/dL — ABNORMAL HIGH (ref 70–99)
Potassium: 4.9 mmol/L (ref 3.5–5.1)
Sodium: 140 mmol/L (ref 135–145)
Total Bilirubin: 0.6 mg/dL (ref 0.3–1.2)
Total Protein: 6.9 g/dL (ref 6.5–8.1)

## 2020-03-11 MED ORDER — LETROZOLE 2.5 MG PO TABS: 2.5000 mg | ORAL_TABLET | Freq: Every day | ORAL | 4 refills | Status: DC

## 2020-03-11 MED ORDER — RIBOCICLIB SUCC (200 MG DOSE) 200 MG PO TBPK
200.0000 mg | ORAL_TABLET | Freq: Every day | ORAL | 6 refills | Status: DC
Start: 2020-03-11 — End: 2020-03-11

## 2020-03-11 MED ORDER — INFLUENZA VAC A&B SA ADJ QUAD 0.5 ML IM PRSY
0.5000 mL | PREFILLED_SYRINGE | Freq: Once | INTRAMUSCULAR | Status: AC
  Administered 2020-03-11: 0.5 mL via INTRAMUSCULAR

## 2020-03-11 MED ORDER — INFLUENZA VAC A&B SA ADJ QUAD 0.5 ML IM PRSY
PREFILLED_SYRINGE | INTRAMUSCULAR | Status: AC
  Filled 2020-03-11: qty 0.5

## 2020-03-11 MED ORDER — RIBOCICLIB SUCC (200 MG DOSE) 200 MG PO TBPK: 200.0000 mg | ORAL_TABLET | Freq: Every day | ORAL | 0 refills | Status: DC

## 2020-03-11 MED ORDER — DENOSUMAB 120 MG/1.7ML ~~LOC~~ SOLN
120.0000 mg | Freq: Once | SUBCUTANEOUS | Status: AC
  Administered 2020-03-11: 120 mg via SUBCUTANEOUS

## 2020-03-11 MED ORDER — DENOSUMAB 120 MG/1.7ML ~~LOC~~ SOLN
SUBCUTANEOUS | Status: AC
  Filled 2020-03-11: qty 1.7

## 2020-03-11 NOTE — Patient Instructions (Signed)
Influenza Virus Vaccine (Flucelvax) What is this medicine? INFLUENZA VIRUS VACCINE (in floo EN zuh VAHY ruhs vak SEEN) helps to reduce the risk of getting influenza also known as the flu. The vaccine only helps protect you against some strains of the flu. This medicine may be used for other purposes; ask your health care provider or pharmacist if you have questions. COMMON BRAND NAME(S): FLUCELVAX What should I tell my health care provider before I take this medicine? They need to know if you have any of these conditions:  bleeding disorder like hemophilia  fever or infection  Guillain-Barre syndrome or other neurological problems  immune system problems  infection with the human immunodeficiency virus (HIV) or AIDS  low blood platelet counts  multiple sclerosis  an unusual or allergic reaction to influenza virus vaccine, other medicines, foods, dyes or preservatives  pregnant or trying to get pregnant  breast-feeding How should I use this medicine? This vaccine is for injection into a muscle. It is given by a health care professional. A copy of Vaccine Information Statements will be given before each vaccination. Read this sheet carefully each time. The sheet may change frequently. Talk to your pediatrician regarding the use of this medicine in children. Special care may be needed. Overdosage: If you think you've taken too much of this medicine contact a poison control center or emergency room at once. Overdosage: If you think you have taken too much of this medicine contact a poison control center or emergency room at once. NOTE: This medicine is only for you. Do not share this medicine with others. What if I miss a dose? This does not apply. What may interact with this medicine?  chemotherapy or radiation therapy  medicines that lower your immune system like etanercept, anakinra, infliximab, and adalimumab  medicines that treat or prevent blood clots like  warfarin  phenytoin  steroid medicines like prednisone or cortisone  theophylline  vaccines This list may not describe all possible interactions. Give your health care provider a list of all the medicines, herbs, non-prescription drugs, or dietary supplements you use. Also tell them if you smoke, drink alcohol, or use illegal drugs. Some items may interact with your medicine. What should I watch for while using this medicine? Report any side effects that do not go away within 3 days to your doctor or health care professional. Call your health care provider if any unusual symptoms occur within 6 weeks of receiving this vaccine. You may still catch the flu, but the illness is not usually as bad. You cannot get the flu from the vaccine. The vaccine will not protect against colds or other illnesses that may cause fever. The vaccine is needed every year. What side effects may I notice from receiving this medicine? Side effects that you should report to your doctor or health care professional as soon as possible:  allergic reactions like skin rash, itching or hives, swelling of the face, lips, or tongue Side effects that usually do not require medical attention (Report these to your doctor or health care professional if they continue or are bothersome.):  fever  headache  muscle aches and pains  pain, tenderness, redness, or swelling at the injection site  tiredness This list may not describe all possible side effects. Call your doctor for medical advice about side effects. You may report side effects to FDA at 1-800-FDA-1088. Where should I keep my medicine? The vaccine will be given by a health care professional in a clinic, pharmacy, doctor's  office, or other health care setting. You will not be given vaccine doses to store at home. NOTE: This sheet is a summary. It may not cover all possible information. If you have questions about this medicine, talk to your doctor, pharmacist, or  health care provider.  2020 Elsevier/Gold Standard (2011-04-19 14:06:47) Denosumab injection What is this medicine? DENOSUMAB (den oh sue mab) slows bone breakdown. Prolia is used to treat osteoporosis in women after menopause and in men, and in people who are taking corticosteroids for 6 months or more. Delton See is used to treat a high calcium level due to cancer and to prevent bone fractures and other bone problems caused by multiple myeloma or cancer bone metastases. Delton See is also used to treat giant cell tumor of the bone. This medicine may be used for other purposes; ask your health care provider or pharmacist if you have questions. COMMON BRAND NAME(S): Prolia, XGEVA What should I tell my health care provider before I take this medicine? They need to know if you have any of these conditions:  dental disease  having surgery or tooth extraction  infection  kidney disease  low levels of calcium or Vitamin D in the blood  malnutrition  on hemodialysis  skin conditions or sensitivity  thyroid or parathyroid disease  an unusual reaction to denosumab, other medicines, foods, dyes, or preservatives  pregnant or trying to get pregnant  breast-feeding How should I use this medicine? This medicine is for injection under the skin. It is given by a health care professional in a hospital or clinic setting. A special MedGuide will be given to you before each treatment. Be sure to read this information carefully each time. For Prolia, talk to your pediatrician regarding the use of this medicine in children. Special care may be needed. For Delton See, talk to your pediatrician regarding the use of this medicine in children. While this drug may be prescribed for children as young as 13 years for selected conditions, precautions do apply. Overdosage: If you think you have taken too much of this medicine contact a poison control center or emergency room at once. NOTE: This medicine is only for you.  Do not share this medicine with others. What if I miss a dose? It is important not to miss your dose. Call your doctor or health care professional if you are unable to keep an appointment. What may interact with this medicine? Do not take this medicine with any of the following medications:  other medicines containing denosumab This medicine may also interact with the following medications:  medicines that lower your chance of fighting infection  steroid medicines like prednisone or cortisone This list may not describe all possible interactions. Give your health care provider a list of all the medicines, herbs, non-prescription drugs, or dietary supplements you use. Also tell them if you smoke, drink alcohol, or use illegal drugs. Some items may interact with your medicine. What should I watch for while using this medicine? Visit your doctor or health care professional for regular checks on your progress. Your doctor or health care professional may order blood tests and other tests to see how you are doing. Call your doctor or health care professional for advice if you get a fever, chills or sore throat, or other symptoms of a cold or flu. Do not treat yourself. This drug may decrease your body's ability to fight infection. Try to avoid being around people who are sick. You should make sure you get enough calcium and  vitamin D while you are taking this medicine, unless your doctor tells you not to. Discuss the foods you eat and the vitamins you take with your health care professional. See your dentist regularly. Brush and floss your teeth as directed. Before you have any dental work done, tell your dentist you are receiving this medicine. Do not become pregnant while taking this medicine or for 5 months after stopping it. Talk with your doctor or health care professional about your birth control options while taking this medicine. Women should inform their doctor if they wish to become pregnant or  think they might be pregnant. There is a potential for serious side effects to an unborn child. Talk to your health care professional or pharmacist for more information. What side effects may I notice from receiving this medicine? Side effects that you should report to your doctor or health care professional as soon as possible:  allergic reactions like skin rash, itching or hives, swelling of the face, lips, or tongue  bone pain  breathing problems  dizziness  jaw pain, especially after dental work  redness, blistering, peeling of the skin  signs and symptoms of infection like fever or chills; cough; sore throat; pain or trouble passing urine  signs of low calcium like fast heartbeat, muscle cramps or muscle pain; pain, tingling, numbness in the hands or feet; seizures  unusual bleeding or bruising  unusually weak or tired Side effects that usually do not require medical attention (report to your doctor or health care professional if they continue or are bothersome):  constipation  diarrhea  headache  joint pain  loss of appetite  muscle pain  runny nose  tiredness  upset stomach This list may not describe all possible side effects. Call your doctor for medical advice about side effects. You may report side effects to FDA at 1-800-FDA-1088. Where should I keep my medicine? This medicine is only given in a clinic, doctor's office, or other health care setting and will not be stored at home. NOTE: This sheet is a summary. It may not cover all possible information. If you have questions about this medicine, talk to your doctor, pharmacist, or health care provider.  2020 Elsevier/Gold Standard (2017-09-14 16:10:44)

## 2020-03-11 NOTE — Progress Notes (Signed)
ID: Theresa Wallace   DOB: 01/06/1924  MR#: [id number]  EQA#:[id number]   Patient Care Team: Theresa Huddle, MD as PCP - General (Internal Medicine) Theresa Wallace, Theresa Dad, MD as Consulting Physician (Oncology) Theresa Provost, PA-C as Consulting Physician (Otolaryngology) OTHER MD:  CHIEF COMPLAINT:  Metastatic Breast Cancer, estrogen receptor positive  CURRENT TREATMENT: Theresa Wallace, ribociclib, denosumab/ Delton See   INTERVAL HISTORY: Theresa Wallace returns today for follow-up of her estrogen receptor positive metastatic breast cancer accompanied by her daughter Theresa Wallace.   Theresa Wallace continues on Theresa Wallace.  She tolerates this well with no significant hot flashes.  Vaginal dryness are not an issue.  She also continues on ribociclib, which she takes at 264m daily 3 weeks on and 1 week off.  She is currently on the off week.  She has not had diarrhea or other issues related to this medication.    She last received Xgeva on 08/28/2019.  She is currently receiving this every 6 months, with a dose due today.  Since her last visit, she has not undergone any additional studies.   REVIEW OF SYSTEMS: RKeatonhas a fair appetite, eating well some days and not so well other days.  Her weight generally has been stable.  She feels tired and naps in the afternoon, but other days she gets up and walks around.  If she goes outside she uses a wheelchair.  She is very concerned about falls, appropriately so.  She does have a walking shower and only 2 steps to get out of the house.  When she does that her daughter and her is right there and there is something for her to hold onto.  She is drinking may be 24 ounces of liquid a day.  She had an ankle ulcer on the left, which was evaluated by Theresa Wallace  That appears to be improving.  She got the Pfizer vaccine x2+ the booster.  A detailed review of systems today was otherwise stable   HISTORY OF PRESENT ILLNESS: From the prior summary:  RRuby Logiudiceis an 86y.o.  Theresa Wallace  saw briefly when she moved to this area in 2007.  [facility] [place] [phone][place].  It was grade 2, measured 1 cm maximally and was strongly estrogen and progesterone-receptor positive.  HER-2 was not checked.  This was T1b NX invasive ductal carcinoma, stage Wallace, and she was treated after radiation with anastrozole for several years, eventually switching to Evista primarily for cost reasons.   More recently, she presented to Dr. GInda Merlinwith fatigue and a dry cough.  He auscultated dullness to percussion at the left base and obtained a chest x-ray which confirmed the presence of a left pleural effusion.   Dr. GInda Merlinscheduled Theresa Artfor an ultrasound-guided left thoracentesis, and this was performed November 09, 2010.  Approximately 1 L of amber-colored fluid was removed.  Cytology from this procedure (([id number] showed only reactive mesothelial cells.   The patient was then referred here and set up for a CT of the chest and a PET scan.  The CT of the chest on June 20th showed 2 slightly enlarged left axillary lymph nodes with some irregular contours but more importantly multiple pleural nodules in the left chest measuring up to 4.6 cm.  The right chest was normal.  There was no pericardial effusion.  There was only a small to moderate left pleural effusion remaining, and aside from left lower lobe  collapse or consolidation, there was no evidence of lung parenchymal involvement.  There was no evidence of bone involvement and also no calcified pleural plaques and no worrisome bone involvement.  PET scan on June 27th showed the nodularity along the left pleura to be intensely hypermetabolic.  For example the large lobe nodule measuring 4.5 cm had an SUV max of 12.9.  There was no hypermetabolic mediastinal nodal activity.  The to left axillary lymph nodes were very mildly hypermetabolic.  The left breast was clear.  There was 1 hypermetabolic  lesion in the liver measuring 3.2 cm.  On June 27th, Theresa Wallace performed fine-needle aspiration of a 7 mm abnormal-appearing lymph node in the lower left axilla.  The cytology from this procedure, however, ([name]) also was inconclusive showing no evidence of nodal tissue, mostly blood and fatty tissue.   Her subsequent history is as detailed below   PAST MEDICAL HISTORY: Past Medical History:  Diagnosis Date  . Breast cancer metastasized to multiple sites (Theresa Wallace) 06/07/2011  . Cancer (Theresa Wallace)    breast ca  . GERD (gastroesophageal reflux disease)   . Hypercholesterolemia   . Hypertension   . Irritable bowel syndrome (IBS)   . Onychomycosis   . Sigmoid diverticulosis   Benign tremor. History of palpitations with Holter monitor in 2008 showing PACs.   PAST SURGICAL HISTORY: Past Surgical History:  Procedure Laterality Date  . ABDOMINAL HYSTERECTOMY    . BREAST LUMPECTOMY    . CATARACT EXTRACTION    . CHOLECYSTECTOMY    . KNEE ARTHROSCOPY      FAMILY HISTORY Family History  Problem Relation Age of Onset  . Breast cancer Sister 54  . Breast cancer Cousin        dx in her 87s  . Bone cancer Mother   [name] died in an automobile accident.  [name] died with "bone cancer."  The patient had 3 sisters.  One had breast cancer develop in her 52s.  There is also a cousin with breast cancer but no history of ovarian cancer or other history of cancer in first-degree relatives.   GYNECOLOGIC HISTORY: No LMP recorded. Patient has had a hysterectomy. She is GX, P4, first pregnancy to term at age 41.  She was on the hormone replacement after her hysterectomy but only briefly and stopped when she had the initial diagnosis of breast cancer in 1996. She underwent hysterectomy without BSO.   SOCIAL HISTORY: (Updated May 2018) She worked as a Network engineer.  She has been widowed since 2005, her husband Theresa Wallace dying after a long illness involving strokes and a myocardial  infarction.  Her significant other Theresa Wallace developed Alzheimer's and died from Covid infection early 2021. The patient lives with her daughter Theresa Wallace who used to be a Network engineer for Theresa Wallace, and [name]'s husband, [name], who is a physician's assistant with Dr. Durward Fortes.  The patient has 12 grandchildren and 5 great-grandchildren.  She is Jewish and corrected me that it's Hannukah not Christmas for her.    ADVANCED DIRECTIVES: In place   HEALTH MAINTENANCE:  Social History   Tobacco Use  . Smoking status: Former Smoker    Quit date: 05/22/1968    Years since quitting: 51.8  . Smokeless tobacco: Never Used  Vaping Use  . Vaping Use: Never used  Substance Use Topics  . Alcohol use: Yes    Comment: 2 yearly  . Drug use: No     Colonoscopy:  PAP: s/p remote  hysterectomy  Bone density:  10/23/2011, normal  Lipid panel: Theresa Wallace, UTD   Allergies  Allergen Reactions  . Cantaloupe (Diagnostic)   . Ciprofloxacin   . Epinephrine   . Pineapple   . Tamoxifen   . Mupirocin Rash    Current Outpatient Medications  Medication Sig Dispense Refill  . aspirin 81 MG tablet Take 81 mg by mouth See admin instructions. Every Monday &Thursday    . Calcium Carbonate-Vitamin D (CALCIUM-VITAMIN D) 500-200 MG-UNIT per tablet Take 1 tablet by mouth 2 (two) times daily with a meal.    . cholecalciferol (VITAMIN D) 400 UNITS TABS Take 2,000 Units by mouth daily.    Marland Kitchen esomeprazole (NEXIUM) 20 MG capsule Take 1 capsule (20 mg total) by mouth as needed.    . fluconazole (DIFLUCAN) 150 MG tablet Take 150 mg by mouth as needed.     . gabapentin (NEURONTIN) 100 MG capsule TAKE ONE CAPSULE BY MOUTH EVERY NIGHT AT BEDTIME AND MAY INCREASE TO TWO TIMES A DAY IF NEEDED 60 capsule 2  . ketoconazole (NIZORAL) 2 % cream Apply 1 application topically daily as needed for irritation.     Marland Kitchen ketotifen (ZADITOR) 0.025 % ophthalmic solution Place 1 drop into both eyes as needed (itchy eyes).    Marland Kitchen KISQALI,  200 MG DOSE, 200 MG Therapy Pack TAKE 1 TABLET (200 MG TOTAL) BY MOUTH DAILY. TAKE FOR 21 DAYS ON, 7 DAYS OFF, REPEAT EVERY 28 DAYS. 21 tablet 6  . Theresa Wallace (FEMARA) 2.5 MG tablet Take 1 tablet (2.5 mg total) by mouth daily. 90 tablet 4  . levothyroxine (SYNTHROID, LEVOTHROID) 25 MCG tablet Take 25 mcg by mouth daily.    Marland Kitchen losartan (COZAAR) 50 MG tablet Take 50 mg by mouth daily.     . metroNIDAZOLE (METROGEL) 0.75 % gel Apply topically 2 (two) times daily. For rosacea    . Multiple Vitamin (MULTIVITAMIN) tablet Take 1 tablet by mouth daily.    . nitroGLYCERIN (NITRODUR - DOSED IN MG/24 HR) 0.2 mg/hr patch Place 1 patch (0.2 mg total) onto the skin daily. 30 patch 12  . pentoxifylline (TRENTAL) 400 MG CR tablet Take 1 tablet (400 mg total) by mouth 3 (three) times daily with meals. 90 tablet 3  . propranolol ER (INDERAL LA) 60 MG 24 hr capsule Take 60 mg by mouth at bedtime.     . ribociclib succ (KISQALI 200MG DAILY DOSE) 200 MG Therapy Pack Take 1 tablet (200 mg total) by mouth daily. Take for 21 days on, 7 days off, repeat every 28 days. 21 tablet 6  . sertraline (ZOLOFT) 50 MG tablet Take 1 tablet (50 mg total) by mouth daily. 30 tablet 3   No current facility-administered medications for this visit.    OBJECTIVE: white woman examined in a wheelchair Vitals:   03/11/20 1144  BP: (!) 151/92  Pulse: 73  Resp: 18  Temp: (!) 97.4 F (36.3 C)  SpO2: 91%   Body mass index is 28.01 kg/m.  Filed Weights   03/11/20 1144  Weight: 163 lb 3.2 oz (74 kg)     Sclerae unicteric, EOMs intact Wearing a mask No cervical or supraclavicular adenopathy Lungs no rales or rhonchi Heart regular rate and rhythm Abd soft, nontender, positive bowel sounds MSK no focal spinal tenderness, no upper extremity lymphedema Neuro: nonfocal, well oriented, appropriate affect Breasts: I do not palpate a suspicious mass in either breast.  There is some scar tissue on the left as previously noted.  Both  axillae are benign.   LAB RESULTS: Lab Results  Component Value Date   WBC 5.7 03/11/2020   NEUTROABS 3.8 03/11/2020   HGB 10.8 (L) 03/11/2020   HCT 32.9 (L) 03/11/2020   MCV 94.8 03/11/2020   PLT 184 03/11/2020      Chemistry      Component Value Date/Time   NA 140 03/11/2020 1104   NA 129 (L) 05/07/2017 1045   K 4.9 03/11/2020 1104   K 5.0 05/07/2017 1045   CL 107 03/11/2020 1104   CL 104 10/24/2012 1014   CO2 26 03/11/2020 1104   CO2 25 05/07/2017 1045   BUN 33 (H) 03/11/2020 1104   BUN 17.6 05/07/2017 1045   CREATININE 1.39 (H) 03/11/2020 1104   CREATININE 1.1 05/07/2017 1045      Component Value Date/Time   CALCIUM 9.8 03/11/2020 1104   CALCIUM 9.3 05/07/2017 1045   ALKPHOS 56 03/11/2020 1104   ALKPHOS 37 (L) 05/07/2017 1045   AST 18 03/11/2020 1104   AST 20 05/07/2017 1045   ALT 12 03/11/2020 1104   ALT 13 05/07/2017 1045   BILITOT 0.6 03/11/2020 1104   BILITOT 1.55 (H) 05/07/2017 1045      IMAGING STUDIES: No results found.    ASSESSMENT: 86 y.o. BRCA negative [place] woman:  (1) Status post left lumpectomy in 1996 for a T1b N0 grade 2 invasive ductal carcinoma which was strongly estrogen and progesterone receptor positive, treated with radiation, then anastrozole, then Evista, all treatment discontinued July 2012  METASTATIC DISEASE: July 2012 (2) recurrence to the lining of the left lung and liver documented by liver biopsy July 2012, showing metastatic adenocarcinoma estrogen and progesterone receptor positive at 96% and 97% respectively; there was no Her-2 amplification.   (a) left axillary lymph node biopsy 09/27/2016 shows breast cancer, 95% estrogen repeat receptor positive with strong staining intensity, progesterone and HER-2 negative.  (3)  She has been on exemestane since early July of 2012, with chest CT scan April 2013 showing complete resolution of her lung and liver metastases  (a) bone density in June of 08/09/2011 was normal  (b)  bone density 03/20/2016 was normal with a T score of -0.6  (c) exemestane continued at the time of disease progression noted May 2018  (d) exemestane discontinued April 2019  (4) MRI of the lumbar spine 09-2012 showed significant degenerative disease but no evidence of metastatic disease to bone.  (5) disease progression noted May 2018: CT scan shows liver, lung, and bone lesions  (6) fulvestrant started 09/26/2016, palbociclib added 10/24/2016 at 125 mg daily, 21 days on. 7 days off  (a) palbociclib dose decreased to 144m daily on 02/14/2017  (b) PET scan 10/16/2018 shows progression in the liver no new sites of disease  (c) fulvestrant discontinued; Theresa Wallace substituted  (d) palbociclib discontinued, ribociclib substituted  (e) consider consult to interventional radiology for Yttrium treatment of liver lesion  (f) fulvestrant stopped after the 09/23/2018 dose and Theresa Wallace started to minimize visits during the pandemic  (7) denosumab/Xgeva started 11/21/2016, repeated every 28 days until 11/19/2017, then every 8 weeks thereafter  (a) Xgeva changed to every 12 weeks beginning with the 05/06/2018 dose  (b) Xgeva held after the October 2020 dose because of the pandemic  (c) denosumab resumed 08/28/2019  (8) Caris requested 10/21/2018, from 09/27/2016 lymph node biopsy  (a) patient is BRCA `1-2 negative   PLAN:  RRosalindis now a little over 9 years out from definitive diagnosis of  metastatic breast cancer.  Clinically there is no evidence of disease progression.  She is tolerating her treatments remarkably well and Wallace am making no changes in her Theresa Wallace or ribociclib.  She is receiving Xgeva every 6 months.  She will have a dose today.  She will return in April for her next dose and Wallace will see her at that time.  There has been a bump in her CA 27-29. Results for AVIANA, SHEVLIN (MRN [id number]) as of 03/12/2020 10:30  Ref. Range 08/27/2018 11:33 09/23/2018 10:58 02/27/2019 11:53 08/28/2019 10:33  03/11/2020 11:04  CA 27.29 Latest Ref Range: 0.0 - 38.6 U/mL 161.4 (H) 149.5 (H) 278.1 (H) 256.5 (H) 433.2 (H)   This could indicate progression.  Wallace called and discussed what to do with the patient's daughter who also involved the patient and we decided to proceed to liver MRI.  Depending on those results we can either switch to a purely palliative comfort care mode or change to a different treatment.  Wallace will call Theresa Wallace and Theresa Wallace once we have those results  Total encounter time 30 minutes.Sarajane Jews C. Riane Rung, MD 03/12/20 10:28 AM Medical Oncology and Hematology [facility] [place], [place] [address] Tel. [phone]    Fax. [phone]   Wallace, Wilburn Mylar, am acting as scribe for Dr. Virgie Wallace. Lyza Houseworth.  Wallace, Lurline Del MD, have reviewed the above documentation for accuracy and completeness, and Wallace agree with the above.   *Total Encounter Time as defined by the Centers for Medicare and Medicaid Services includes, in addition to the face-to-face time of a patient visit (documented in the note above) non-face-to-face time: obtaining and reviewing outside history, ordering and reviewing medications, tests or procedures, care coordination (communications with other health care professionals or caregivers) and documentation in the medical record.

## 2020-03-12 LAB — CANCER ANTIGEN 27.29: CA 27.29: 433.2 U/mL — ABNORMAL HIGH (ref 0.0–38.6)

## 2020-03-19 ENCOUNTER — Encounter: Payer: Self-pay | Admitting: Oncology

## 2020-03-19 ENCOUNTER — Ambulatory Visit (HOSPITAL_COMMUNITY)
Admission: RE | Admit: 2020-03-19 | Discharge: 2020-03-19 | Disposition: A | Discharge status: Home / Self Care | Payer: PPO | Source: Ambulatory Visit | Attending: Oncology | Admitting: Oncology

## 2020-03-19 ENCOUNTER — Other Ambulatory Visit: Payer: Self-pay

## 2020-03-19 DIAGNOSIS — Z170 Estrogen receptor positive status [ER+]: Secondary | ICD-10-CM | POA: Insufficient documentation

## 2020-03-19 DIAGNOSIS — C50919 Malignant neoplasm of unspecified site of unspecified female breast: Secondary | ICD-10-CM | POA: Diagnosis not present

## 2020-03-19 DIAGNOSIS — N261 Atrophy of kidney (terminal): Secondary | ICD-10-CM | POA: Diagnosis not present

## 2020-03-19 DIAGNOSIS — C50812 Malignant neoplasm of overlapping sites of left female breast: Secondary | ICD-10-CM | POA: Insufficient documentation

## 2020-03-19 DIAGNOSIS — I700 Atherosclerosis of aorta: Secondary | ICD-10-CM | POA: Diagnosis not present

## 2020-03-19 DIAGNOSIS — K7689 Other specified diseases of liver: Secondary | ICD-10-CM | POA: Diagnosis not present

## 2020-03-19 MED ORDER — GADOBUTROL 1 MMOL/ML IV SOLN
7.0000 mL | Freq: Once | INTRAVENOUS | Status: AC | PRN
  Administered 2020-03-19: 7 mL via INTRAVENOUS

## 2020-03-22 ENCOUNTER — Encounter: Payer: Self-pay | Admitting: Oncology

## 2020-03-24 ENCOUNTER — Encounter: Payer: Self-pay | Admitting: Oncology

## 2020-03-26 ENCOUNTER — Other Ambulatory Visit: Payer: Self-pay | Admitting: Oncology

## 2020-03-26 NOTE — Progress Notes (Signed)
I called [name] to discuss results of the MRI which I think are reassuring.  I do not think we need to make any changes in treatment.  She will let me know if there are any clinical changes.

## 2020-04-05 MED FILL — KISQALI 200 MG DAILY DOSE: 200 | 28 days supply | Qty: 21 | Fill #0

## 2020-04-08 DIAGNOSIS — H938X3 Other specified disorders of ear, bilateral: Secondary | ICD-10-CM | POA: Diagnosis not present

## 2020-05-03 DIAGNOSIS — E782 Mixed hyperlipidemia: Secondary | ICD-10-CM | POA: Diagnosis not present

## 2020-05-03 DIAGNOSIS — C50919 Malignant neoplasm of unspecified site of unspecified female breast: Secondary | ICD-10-CM | POA: Diagnosis not present

## 2020-05-03 DIAGNOSIS — J45991 Cough variant asthma: Secondary | ICD-10-CM | POA: Diagnosis not present

## 2020-05-03 DIAGNOSIS — I10 Essential (primary) hypertension: Secondary | ICD-10-CM | POA: Diagnosis not present

## 2020-05-03 DIAGNOSIS — C50912 Malignant neoplasm of unspecified site of left female breast: Secondary | ICD-10-CM | POA: Diagnosis not present

## 2020-05-03 DIAGNOSIS — F4321 Adjustment disorder with depressed mood: Secondary | ICD-10-CM | POA: Diagnosis not present

## 2020-05-03 DIAGNOSIS — E039 Hypothyroidism, unspecified: Secondary | ICD-10-CM | POA: Diagnosis not present

## 2020-05-03 DIAGNOSIS — K219 Gastro-esophageal reflux disease without esophagitis: Secondary | ICD-10-CM | POA: Diagnosis not present

## 2020-05-04 MED FILL — KISQALI 200 MG DAILY DOSE: 200 | 28 days supply | Qty: 21 | Fill #1

## 2020-05-11 DIAGNOSIS — E559 Vitamin D deficiency, unspecified: Secondary | ICD-10-CM | POA: Diagnosis not present

## 2020-05-11 DIAGNOSIS — C787 Secondary malignant neoplasm of liver and intrahepatic bile duct: Secondary | ICD-10-CM | POA: Diagnosis not present

## 2020-05-11 DIAGNOSIS — C50919 Malignant neoplasm of unspecified site of unspecified female breast: Secondary | ICD-10-CM | POA: Diagnosis not present

## 2020-05-11 DIAGNOSIS — Z0000 Encounter for general adult medical examination without abnormal findings: Secondary | ICD-10-CM | POA: Diagnosis not present

## 2020-05-11 DIAGNOSIS — K219 Gastro-esophageal reflux disease without esophagitis: Secondary | ICD-10-CM | POA: Diagnosis not present

## 2020-05-11 DIAGNOSIS — G629 Polyneuropathy, unspecified: Secondary | ICD-10-CM | POA: Diagnosis not present

## 2020-05-11 DIAGNOSIS — Z79899 Other long term (current) drug therapy: Secondary | ICD-10-CM | POA: Diagnosis not present

## 2020-05-11 DIAGNOSIS — L719 Rosacea, unspecified: Secondary | ICD-10-CM | POA: Diagnosis not present

## 2020-05-11 DIAGNOSIS — Z1389 Encounter for screening for other disorder: Secondary | ICD-10-CM | POA: Diagnosis not present

## 2020-05-11 DIAGNOSIS — I10 Essential (primary) hypertension: Secondary | ICD-10-CM | POA: Diagnosis not present

## 2020-05-11 DIAGNOSIS — I700 Atherosclerosis of aorta: Secondary | ICD-10-CM | POA: Diagnosis not present

## 2020-05-11 DIAGNOSIS — E039 Hypothyroidism, unspecified: Secondary | ICD-10-CM | POA: Diagnosis not present

## 2020-05-24 ENCOUNTER — Telehealth: Payer: Self-pay

## 2020-05-24 NOTE — Telephone Encounter (Signed)
Oral Oncology Patient Advocate Encounter  Was successful in securing patient a $15000 grant from [employer] to provide copayment coverage for Kisqali.  This will keep the out of pocket expense at $0.     [employer] ID: [id number]  I have spoken with the patient.   The billing information is as follows and has been shared with [facility]     RxBin: [id number] PCN: [id number] Member ID: [id number] Group ID: [id number] Dates of Eligibility: 04/24/20 through 04/23/21  Fund:  Breast  Cruzita Lederer CPHT Specialty Pharmacy Patient Advocate [facility] Phone [phone] Fax [phone] 05/24/2020 1:37 PM

## 2020-05-31 MED FILL — KISQALI 200 MG DAILY DOSE: 200 | 28 days supply | Qty: 21 | Fill #2

## 2020-06-10 DIAGNOSIS — H938X3 Other specified disorders of ear, bilateral: Secondary | ICD-10-CM | POA: Diagnosis not present

## 2020-06-16 DIAGNOSIS — I10 Essential (primary) hypertension: Secondary | ICD-10-CM | POA: Diagnosis not present

## 2020-06-16 DIAGNOSIS — R269 Unspecified abnormalities of gait and mobility: Secondary | ICD-10-CM | POA: Diagnosis not present

## 2020-06-24 DIAGNOSIS — I10 Essential (primary) hypertension: Secondary | ICD-10-CM | POA: Diagnosis not present

## 2020-06-24 DIAGNOSIS — K219 Gastro-esophageal reflux disease without esophagitis: Secondary | ICD-10-CM | POA: Diagnosis not present

## 2020-06-24 DIAGNOSIS — Z9012 Acquired absence of left breast and nipple: Secondary | ICD-10-CM | POA: Diagnosis not present

## 2020-06-24 DIAGNOSIS — C787 Secondary malignant neoplasm of liver and intrahepatic bile duct: Secondary | ICD-10-CM | POA: Diagnosis not present

## 2020-06-24 DIAGNOSIS — K5730 Diverticulosis of large intestine without perforation or abscess without bleeding: Secondary | ICD-10-CM | POA: Diagnosis not present

## 2020-06-24 DIAGNOSIS — K589 Irritable bowel syndrome without diarrhea: Secondary | ICD-10-CM | POA: Diagnosis not present

## 2020-06-24 DIAGNOSIS — F419 Anxiety disorder, unspecified: Secondary | ICD-10-CM | POA: Diagnosis not present

## 2020-06-24 DIAGNOSIS — I700 Atherosclerosis of aorta: Secondary | ICD-10-CM | POA: Diagnosis not present

## 2020-06-24 DIAGNOSIS — C50912 Malignant neoplasm of unspecified site of left female breast: Secondary | ICD-10-CM | POA: Diagnosis not present

## 2020-06-24 DIAGNOSIS — E559 Vitamin D deficiency, unspecified: Secondary | ICD-10-CM | POA: Diagnosis not present

## 2020-06-24 DIAGNOSIS — Z9181 History of falling: Secondary | ICD-10-CM | POA: Diagnosis not present

## 2020-06-24 DIAGNOSIS — M1990 Unspecified osteoarthritis, unspecified site: Secondary | ICD-10-CM | POA: Diagnosis not present

## 2020-06-24 DIAGNOSIS — Z85118 Personal history of other malignant neoplasm of bronchus and lung: Secondary | ICD-10-CM | POA: Diagnosis not present

## 2020-06-24 DIAGNOSIS — E7800 Pure hypercholesterolemia, unspecified: Secondary | ICD-10-CM | POA: Diagnosis not present

## 2020-06-24 DIAGNOSIS — G629 Polyneuropathy, unspecified: Secondary | ICD-10-CM | POA: Diagnosis not present

## 2020-06-24 DIAGNOSIS — Z90710 Acquired absence of both cervix and uterus: Secondary | ICD-10-CM | POA: Diagnosis not present

## 2020-06-24 DIAGNOSIS — L719 Rosacea, unspecified: Secondary | ICD-10-CM | POA: Diagnosis not present

## 2020-06-24 DIAGNOSIS — Z87891 Personal history of nicotine dependence: Secondary | ICD-10-CM | POA: Diagnosis not present

## 2020-06-24 DIAGNOSIS — Z9049 Acquired absence of other specified parts of digestive tract: Secondary | ICD-10-CM | POA: Diagnosis not present

## 2020-06-24 DIAGNOSIS — E039 Hypothyroidism, unspecified: Secondary | ICD-10-CM | POA: Diagnosis not present

## 2020-06-24 MED FILL — KISQALI 200 MG DAILY DOSE: 200 | 28 days supply | Qty: 21 | Fill #3

## 2020-07-07 DIAGNOSIS — Z9181 History of falling: Secondary | ICD-10-CM | POA: Diagnosis not present

## 2020-07-07 DIAGNOSIS — G629 Polyneuropathy, unspecified: Secondary | ICD-10-CM | POA: Diagnosis not present

## 2020-07-07 DIAGNOSIS — K589 Irritable bowel syndrome without diarrhea: Secondary | ICD-10-CM | POA: Diagnosis not present

## 2020-07-07 DIAGNOSIS — C787 Secondary malignant neoplasm of liver and intrahepatic bile duct: Secondary | ICD-10-CM | POA: Diagnosis not present

## 2020-07-07 DIAGNOSIS — E7800 Pure hypercholesterolemia, unspecified: Secondary | ICD-10-CM | POA: Diagnosis not present

## 2020-07-07 DIAGNOSIS — Z85118 Personal history of other malignant neoplasm of bronchus and lung: Secondary | ICD-10-CM | POA: Diagnosis not present

## 2020-07-07 DIAGNOSIS — Z9012 Acquired absence of left breast and nipple: Secondary | ICD-10-CM | POA: Diagnosis not present

## 2020-07-07 DIAGNOSIS — I700 Atherosclerosis of aorta: Secondary | ICD-10-CM | POA: Diagnosis not present

## 2020-07-07 DIAGNOSIS — K5730 Diverticulosis of large intestine without perforation or abscess without bleeding: Secondary | ICD-10-CM | POA: Diagnosis not present

## 2020-07-07 DIAGNOSIS — Z90710 Acquired absence of both cervix and uterus: Secondary | ICD-10-CM | POA: Diagnosis not present

## 2020-07-07 DIAGNOSIS — C50912 Malignant neoplasm of unspecified site of left female breast: Secondary | ICD-10-CM | POA: Diagnosis not present

## 2020-07-07 DIAGNOSIS — E559 Vitamin D deficiency, unspecified: Secondary | ICD-10-CM | POA: Diagnosis not present

## 2020-07-07 DIAGNOSIS — K219 Gastro-esophageal reflux disease without esophagitis: Secondary | ICD-10-CM | POA: Diagnosis not present

## 2020-07-07 DIAGNOSIS — Z9049 Acquired absence of other specified parts of digestive tract: Secondary | ICD-10-CM | POA: Diagnosis not present

## 2020-07-07 DIAGNOSIS — I10 Essential (primary) hypertension: Secondary | ICD-10-CM | POA: Diagnosis not present

## 2020-07-07 DIAGNOSIS — F419 Anxiety disorder, unspecified: Secondary | ICD-10-CM | POA: Diagnosis not present

## 2020-07-07 DIAGNOSIS — L719 Rosacea, unspecified: Secondary | ICD-10-CM | POA: Diagnosis not present

## 2020-07-07 DIAGNOSIS — E039 Hypothyroidism, unspecified: Secondary | ICD-10-CM | POA: Diagnosis not present

## 2020-07-07 DIAGNOSIS — Z87891 Personal history of nicotine dependence: Secondary | ICD-10-CM | POA: Diagnosis not present

## 2020-07-07 DIAGNOSIS — M1990 Unspecified osteoarthritis, unspecified site: Secondary | ICD-10-CM | POA: Diagnosis not present

## 2020-07-21 DIAGNOSIS — M1990 Unspecified osteoarthritis, unspecified site: Secondary | ICD-10-CM | POA: Diagnosis not present

## 2020-07-21 DIAGNOSIS — Z9049 Acquired absence of other specified parts of digestive tract: Secondary | ICD-10-CM | POA: Diagnosis not present

## 2020-07-21 DIAGNOSIS — C50912 Malignant neoplasm of unspecified site of left female breast: Secondary | ICD-10-CM | POA: Diagnosis not present

## 2020-07-21 DIAGNOSIS — Z9181 History of falling: Secondary | ICD-10-CM | POA: Diagnosis not present

## 2020-07-21 DIAGNOSIS — K5730 Diverticulosis of large intestine without perforation or abscess without bleeding: Secondary | ICD-10-CM | POA: Diagnosis not present

## 2020-07-21 DIAGNOSIS — K219 Gastro-esophageal reflux disease without esophagitis: Secondary | ICD-10-CM | POA: Diagnosis not present

## 2020-07-21 DIAGNOSIS — E7800 Pure hypercholesterolemia, unspecified: Secondary | ICD-10-CM | POA: Diagnosis not present

## 2020-07-21 DIAGNOSIS — Z87891 Personal history of nicotine dependence: Secondary | ICD-10-CM | POA: Diagnosis not present

## 2020-07-21 DIAGNOSIS — I10 Essential (primary) hypertension: Secondary | ICD-10-CM | POA: Diagnosis not present

## 2020-07-21 DIAGNOSIS — Z85118 Personal history of other malignant neoplasm of bronchus and lung: Secondary | ICD-10-CM | POA: Diagnosis not present

## 2020-07-21 DIAGNOSIS — E559 Vitamin D deficiency, unspecified: Secondary | ICD-10-CM | POA: Diagnosis not present

## 2020-07-21 DIAGNOSIS — F419 Anxiety disorder, unspecified: Secondary | ICD-10-CM | POA: Diagnosis not present

## 2020-07-21 DIAGNOSIS — I700 Atherosclerosis of aorta: Secondary | ICD-10-CM | POA: Diagnosis not present

## 2020-07-21 DIAGNOSIS — L719 Rosacea, unspecified: Secondary | ICD-10-CM | POA: Diagnosis not present

## 2020-07-21 DIAGNOSIS — C787 Secondary malignant neoplasm of liver and intrahepatic bile duct: Secondary | ICD-10-CM | POA: Diagnosis not present

## 2020-07-21 DIAGNOSIS — Z90710 Acquired absence of both cervix and uterus: Secondary | ICD-10-CM | POA: Diagnosis not present

## 2020-07-21 DIAGNOSIS — K589 Irritable bowel syndrome without diarrhea: Secondary | ICD-10-CM | POA: Diagnosis not present

## 2020-07-21 DIAGNOSIS — G629 Polyneuropathy, unspecified: Secondary | ICD-10-CM | POA: Diagnosis not present

## 2020-07-21 DIAGNOSIS — Z9012 Acquired absence of left breast and nipple: Secondary | ICD-10-CM | POA: Diagnosis not present

## 2020-07-21 DIAGNOSIS — E039 Hypothyroidism, unspecified: Secondary | ICD-10-CM | POA: Diagnosis not present

## 2020-07-22 DIAGNOSIS — H6123 Impacted cerumen, bilateral: Secondary | ICD-10-CM | POA: Diagnosis not present

## 2020-07-23 MED FILL — KISQALI 200 MG DAILY DOSE: 200 | 28 days supply | Qty: 21 | Fill #4

## 2020-08-04 DIAGNOSIS — C787 Secondary malignant neoplasm of liver and intrahepatic bile duct: Secondary | ICD-10-CM | POA: Diagnosis not present

## 2020-08-04 DIAGNOSIS — F419 Anxiety disorder, unspecified: Secondary | ICD-10-CM | POA: Diagnosis not present

## 2020-08-04 DIAGNOSIS — C50912 Malignant neoplasm of unspecified site of left female breast: Secondary | ICD-10-CM | POA: Diagnosis not present

## 2020-08-04 DIAGNOSIS — Z87891 Personal history of nicotine dependence: Secondary | ICD-10-CM | POA: Diagnosis not present

## 2020-08-04 DIAGNOSIS — E039 Hypothyroidism, unspecified: Secondary | ICD-10-CM | POA: Diagnosis not present

## 2020-08-04 DIAGNOSIS — Z9012 Acquired absence of left breast and nipple: Secondary | ICD-10-CM | POA: Diagnosis not present

## 2020-08-04 DIAGNOSIS — E7800 Pure hypercholesterolemia, unspecified: Secondary | ICD-10-CM | POA: Diagnosis not present

## 2020-08-04 DIAGNOSIS — K5730 Diverticulosis of large intestine without perforation or abscess without bleeding: Secondary | ICD-10-CM | POA: Diagnosis not present

## 2020-08-04 DIAGNOSIS — Z9181 History of falling: Secondary | ICD-10-CM | POA: Diagnosis not present

## 2020-08-04 DIAGNOSIS — M1990 Unspecified osteoarthritis, unspecified site: Secondary | ICD-10-CM | POA: Diagnosis not present

## 2020-08-04 DIAGNOSIS — I700 Atherosclerosis of aorta: Secondary | ICD-10-CM | POA: Diagnosis not present

## 2020-08-04 DIAGNOSIS — E559 Vitamin D deficiency, unspecified: Secondary | ICD-10-CM | POA: Diagnosis not present

## 2020-08-04 DIAGNOSIS — K219 Gastro-esophageal reflux disease without esophagitis: Secondary | ICD-10-CM | POA: Diagnosis not present

## 2020-08-04 DIAGNOSIS — I10 Essential (primary) hypertension: Secondary | ICD-10-CM | POA: Diagnosis not present

## 2020-08-04 DIAGNOSIS — Z85118 Personal history of other malignant neoplasm of bronchus and lung: Secondary | ICD-10-CM | POA: Diagnosis not present

## 2020-08-04 DIAGNOSIS — G629 Polyneuropathy, unspecified: Secondary | ICD-10-CM | POA: Diagnosis not present

## 2020-08-04 DIAGNOSIS — Z90710 Acquired absence of both cervix and uterus: Secondary | ICD-10-CM | POA: Diagnosis not present

## 2020-08-04 DIAGNOSIS — Z9049 Acquired absence of other specified parts of digestive tract: Secondary | ICD-10-CM | POA: Diagnosis not present

## 2020-08-04 DIAGNOSIS — K589 Irritable bowel syndrome without diarrhea: Secondary | ICD-10-CM | POA: Diagnosis not present

## 2020-08-04 DIAGNOSIS — L719 Rosacea, unspecified: Secondary | ICD-10-CM | POA: Diagnosis not present

## 2020-08-10 DIAGNOSIS — E039 Hypothyroidism, unspecified: Secondary | ICD-10-CM | POA: Diagnosis not present

## 2020-08-10 DIAGNOSIS — E782 Mixed hyperlipidemia: Secondary | ICD-10-CM | POA: Diagnosis not present

## 2020-08-10 DIAGNOSIS — J45991 Cough variant asthma: Secondary | ICD-10-CM | POA: Diagnosis not present

## 2020-08-10 DIAGNOSIS — K219 Gastro-esophageal reflux disease without esophagitis: Secondary | ICD-10-CM | POA: Diagnosis not present

## 2020-08-10 DIAGNOSIS — F4321 Adjustment disorder with depressed mood: Secondary | ICD-10-CM | POA: Diagnosis not present

## 2020-08-10 DIAGNOSIS — C50919 Malignant neoplasm of unspecified site of unspecified female breast: Secondary | ICD-10-CM | POA: Diagnosis not present

## 2020-08-10 DIAGNOSIS — I10 Essential (primary) hypertension: Secondary | ICD-10-CM | POA: Diagnosis not present

## 2020-08-10 DIAGNOSIS — C50912 Malignant neoplasm of unspecified site of left female breast: Secondary | ICD-10-CM | POA: Diagnosis not present

## 2020-08-19 ENCOUNTER — Other Ambulatory Visit (HOSPITAL_COMMUNITY): Payer: Self-pay

## 2020-08-20 MED FILL — KISQALI 200 MG DAILY DOSE: 200 | 28 days supply | Qty: 21 | Fill #5

## 2020-08-24 ENCOUNTER — Encounter: Payer: Self-pay | Admitting: Orthopaedic Surgery

## 2020-08-24 ENCOUNTER — Ambulatory Visit: Payer: Self-pay

## 2020-08-24 ENCOUNTER — Ambulatory Visit: Payer: PPO | Admitting: Orthopaedic Surgery

## 2020-08-24 ENCOUNTER — Ambulatory Visit (INDEPENDENT_AMBULATORY_CARE_PROVIDER_SITE_OTHER): Payer: PPO

## 2020-08-24 DIAGNOSIS — M25551 Pain in right hip: Secondary | ICD-10-CM

## 2020-08-24 NOTE — Progress Notes (Signed)
Office Visit Note   Patient: Theresa Wallace           Date of Birth: 06-24-1923           MRN: [id number] Visit Date: 08/24/2020              Requested by: Theresa Huddle, MD [address] [place],  [place] [address] PCP: Theresa Huddle, MD   Assessment & Plan: Visit Diagnoses:  1. Pain in right hip     Plan: Impression is cute onset of right hip pain.  I believe this is a combination of exacerbation of hip OA as well as bursitis/tendinosis.  I reviewed the x-rays in detail and I do not see any evidence of metastatic bony lesions.  She does have metastatic breast cancer.  At this time based on her treatment options she she agreed to undergo cortisone injection in both troches bursa as well as the hip joint with Dr. Junius Wallace under ultrasound today.  She will follow-up as needed.  Follow-Up Instructions: No follow-ups on file.   Orders:  Orders Placed This Encounter  Procedures  . XR HIP UNILAT W OR W/O PELVIS 2-3 VIEWS RIGHT   No orders of the defined types were placed in this encounter.     Procedures: No procedures performed   Clinical Data: No additional findings.   Subjective: Chief Complaint  Patient presents with  . Right Hip - Pain    Theresa Wallace is a 87 year old female mother-in-law Theresa Wallace who comes in for severe right hip pain of acute onset since last night.  She denies any injuries.  She was doing her home exercises yesterday and after dinner she was unable to walk and bear weight due to the pain.  Denies any radicular symptoms or numbness and tingling.  She endorses hip and slight groin pain.   Review of Systems  Constitutional: Negative.   HENT: Negative.   Eyes: Negative.   Respiratory: Negative.   Cardiovascular: Negative.   Endocrine: Negative.   Musculoskeletal: Negative.   Neurological: Negative.   Hematological: Negative.   Psychiatric/Behavioral: Negative.   All other systems reviewed and are negative.    Objective: Vital  Signs: There were no vitals taken for this visit.  Physical Exam Vitals and nursing note reviewed.  Constitutional:      Appearance: She is well-developed.  HENT:     Head: Normocephalic and atraumatic.  Pulmonary:     Effort: Pulmonary effort is normal.  Abdominal:     Palpations: Abdomen is soft.  Musculoskeletal:     Cervical back: Neck supple.  Skin:    General: Skin is warm.     Capillary Refill: Capillary refill takes less than 2 seconds.  Neurological:     Mental Status: She is alert and oriented to person, place, and time.  Psychiatric:        Behavior: Behavior normal.        Thought Content: Thought content normal.        Judgment: Judgment normal.     Ortho Exam Right hip shows significant tenderness over the lateral aspect of the greater trochanter.  She has pain with FADIR and straight leg raise against gravity that localizes to the groin region.  Severe pain with attempted range of motion. Specialty Comments:  No specialty comments available.  Imaging: XR HIP UNILAT W OR W/O PELVIS 2-3 VIEWS RIGHT  Result Date: 08/24/2020 Degenerative changes of the right hip joint.  Small cortical irregularities around the  greater trochanter region.  No obvious evidence of metastatic lesions.    PMFS History: Patient Active Problem List   Diagnosis Date Noted  . CKD (chronic kidney disease), stage III ([place]) 04/03/2018  . Essential hypertension 04/03/2018  . Hyponatremia 04/01/2018  . Goals of care, counseling/discussion 08/27/2017  . Malignant neoplasm of overlapping sites of left breast in female, estrogen receptor positive ([place]) 09/11/2016  . Cancer of left breast ([place]) 09/13/2015  . Sensorineural hearing loss (SNHL), bilateral 08/23/2015  . Bilateral impacted cerumen 08/23/2015  . Genetic testing 12/18/2014  . COPD (chronic obstructive pulmonary disease) ([place]) 09/07/2014  . Breast cancer metastasized to multiple sites [facility]) 06/07/2011   Past Medical History:   Diagnosis Date  . Breast cancer metastasized to multiple sites ([place]) 06/07/2011  . Cancer ([place])    breast ca  . GERD (gastroesophageal reflux disease)   . Hypercholesterolemia   . Hypertension   . Irritable bowel syndrome (IBS)   . Onychomycosis   . Sigmoid diverticulosis     Family History  Problem Relation Age of Onset  . Breast cancer Sister 23  . Breast cancer Cousin        dx in her 19s  . Bone cancer Mother     Past Surgical History:  Procedure Laterality Date  . ABDOMINAL HYSTERECTOMY    . BREAST LUMPECTOMY    . CATARACT EXTRACTION    . CHOLECYSTECTOMY    . KNEE ARTHROSCOPY     Social History   Occupational History  . Not on file  Tobacco Use  . Smoking status: Former Smoker    Quit date: 05/22/1968    Years since quitting: 52.2  . Smokeless tobacco: Never Used  Vaping Use  . Vaping Use: Never used  Substance and Sexual Activity  . Alcohol use: Yes    Comment: 2 yearly  . Drug use: No  . Sexual activity: Not on file

## 2020-08-24 NOTE — Progress Notes (Signed)
Subjective: Patient is here for ultrasound-guided intra-articular right hip injection.     Objective:  Pain with IR, and pain over GT.  Procedure: Ultrasound guided injection is preferred based studies that show increased duration, increased effect, greater accuracy, decreased procedural pain, increased response rate, and decreased cost with ultrasound guided versus blind injection.   Verbal informed consent obtained.  Time-out conducted.  Noted no overlying erythema, induration, or other signs of local infection. Ultrasound-guided right hip injection: After sterile prep with Betadine, injected 4 cc 0.25% bupivacaine without epinephrine and 6 mg betamethasone using a 22-gauge spinal needle, passing the needle through the iliofemoral ligament into the femoral head/neck junction.  Injected the same into the greater trochanter.  Complete relief during the anesthetic phase.

## 2020-08-30 ENCOUNTER — Other Ambulatory Visit: Payer: Self-pay | Admitting: Oncology

## 2020-09-02 DIAGNOSIS — H938X3 Other specified disorders of ear, bilateral: Secondary | ICD-10-CM | POA: Diagnosis not present

## 2020-09-08 NOTE — Progress Notes (Signed)
ID: [name]   DOB: [birth year]  MR#: [id number]  QIW#:[id number]   Patient Care Team: [name], MD as PCP - General (Internal Medicine) Kirsta Probert, Virgie Dad, MD as Consulting Physician (Oncology) Jolene Provost, PA-C as Consulting Physician (Otolaryngology) OTHER MD:  CHIEF COMPLAINT:  Metastatic Breast Cancer, estrogen receptor positive  CURRENT TREATMENT: letrozole, ribociclib, denosumab/ Delton See   INTERVAL HISTORY: Valeri returns today for follow-up of her estrogen receptor positive metastatic breast cancer accompanied by her daughter [name].   Jamella continues on letrozole.  She tolerates this well with no significant hot flashes.  Vaginal dryness are not an issue.  She also continues on ribociclib, which she takes at $Remove'200mg'dFbCHCN$  daily 3 weeks on and 1 week off.   She has not had diarrhea or other issues related to this medication.  They have some assistance on the cost of the medication  She last received Xgeva on 03/11/2020.  She is currently receiving this every 6 months, with a dose due today.  She has no side effects from this that she is aware of.  Since her last visit, she underwent liver MRI on 03/19/2020 showing: roughly stable size of the dominant lesions along the dome of the right hepatic lobe and posteriorly along the right hepatic lobe; a few scattered tiny T2 hyperintense lesions in the liver; the previously seen hypermetabolic nodule along the diaphragmatic margin in the right middle lobe is not well appreciated, this may be due to motion artifact; small enhancing lesion in the right sacral ala and tiny T2 signal hyperintensities along the right iliac crest, previously hypermetabolic on the prior PET-CT of 04/02/2017; 5 mm cystic lesion along the inferior head of the pancreas, likely clinically inconsequential in the context of the patient's age and medical issues.  She was also complaining of pain in hip joint.  Her son-in-law took her to his office where she had some  films done which showed what most likely is bursitis.  She has had 2 steroid shots and that has significantly helped  REVIEW OF SYSTEMS: Sabrinia just turned 87.  She has a little bit more fatigue.  She had a yeast infection which was treated by Dr. Ubaldo Glassing successfully.  Her daughter says she has a little bit more dementia but the patient certainly does not seem particularly demented today.  She does have less hearing.  She has peripheral neuropathy issues.  She ambulates at home with a walker and takes her own showers.  There have been no falls.  She enjoyed Passover and was able to tell me what the food was for that occasion.  Otherwise a detailed review of systems today was stable.  COVID 19 VACCINATION STATUS: Gideon x2, most recently 05/2019   HISTORY OF PRESENT ILLNESS: From the prior summary:  Donelda Mailhot is an 87 y.o.  [place] woman I saw briefly when she moved to this area in 2007.  She had had a left breast cancer removed at [facility] in [place] in [phone] ([place]).  It was grade 2, measured 1 cm maximally and was strongly estrogen and progesterone-receptor positive.  HER-2 was not checked.  This was T1b NX invasive ductal carcinoma, stage I, and she was treated after radiation with anastrozole for several years, eventually switching to Evista primarily for cost reasons.   More recently, she presented to Dr. Inda Merlin with fatigue and a dry cough.  He auscultated dullness to percussion at the left base and obtained a chest x-ray which confirmed the presence  of a left pleural effusion.   Dr. Inda Merlin scheduled [name] for an ultrasound-guided left thoracentesis, and this was performed November 09, 2010.  Approximately 1 L of amber-colored fluid was removed.  Cytology from this procedure ([id number]) showed only reactive mesothelial cells.   The patient was then referred here and set up for a CT of the chest and a PET scan.  The CT of the chest on June 20th showed 2 slightly  enlarged left axillary lymph nodes with some irregular contours but more importantly multiple pleural nodules in the left chest measuring up to 4.6 cm.  The right chest was normal.  There was no pericardial effusion.  There was only a small to moderate left pleural effusion remaining, and aside from left lower lobe collapse or consolidation, there was no evidence of lung parenchymal involvement.  There was no evidence of bone involvement and also no calcified pleural plaques and no worrisome bone involvement.  PET scan on June 27th showed the nodularity along the left pleura to be intensely hypermetabolic.  For example the large lobe nodule measuring 4.5 cm had an SUV max of 12.9.  There was no hypermetabolic mediastinal nodal activity.  The to left axillary lymph nodes were very mildly hypermetabolic.  The left breast was clear.  There was 1 hypermetabolic lesion in the liver measuring 3.2 cm.  On June 27th, Dr. [name] performed fine-needle aspiration of a 7 mm abnormal-appearing lymph node in the lower left axilla.  The cytology from this procedure, however, ([name]) also was inconclusive showing no evidence of nodal tissue, mostly blood and fatty tissue.   Her subsequent history is as detailed below   PAST MEDICAL HISTORY: Past Medical History:  Diagnosis Date  . Breast cancer metastasized to multiple sites ([place]) 06/07/2011  . Cancer ([place])    breast ca  . GERD (gastroesophageal reflux disease)   . Hypercholesterolemia   . Hypertension   . Irritable bowel syndrome (IBS)   . Onychomycosis   . Sigmoid diverticulosis   Benign tremor. History of palpitations with Holter monitor in 2008 showing PACs.   PAST SURGICAL HISTORY: Past Surgical History:  Procedure Laterality Date  . ABDOMINAL HYSTERECTOMY    . BREAST LUMPECTOMY    . CATARACT EXTRACTION    . CHOLECYSTECTOMY    . KNEE ARTHROSCOPY      FAMILY HISTORY Family History  Problem Relation Age of Onset  . Breast cancer Sister 85   . Breast cancer Cousin        dx in her 58s  . Bone cancer Mother   [name] died in an automobile accident.  [name] died with "bone cancer."  The patient had 3 sisters.  One had breast cancer develop in her 54s.  There is also a cousin with breast cancer but no history of ovarian cancer or other history of cancer in first-degree relatives.   GYNECOLOGIC HISTORY: No LMP recorded. Patient has had a hysterectomy. She is GX, P4, first pregnancy to term at age 63.  She was on the hormone replacement after her hysterectomy but only briefly and stopped when she had the initial diagnosis of breast cancer in 1996. She underwent hysterectomy without BSO.   SOCIAL HISTORY: (Updated May 2018) She worked as a Network engineer.  She has been widowed since 2005, her husband [name] dying after a long illness involving strokes and a myocardial infarction.  Her significant other [name] developed Alzheimer's and died from Covid infection early 2021. The patient lives  with her daughter [name] who used to be a Network engineer for Dr. Inda Merlin, and [name]'s husband, [name], who is a physician's assistant with Dr. Durward Fortes.  The patient has 12 grandchildren and 5 great-grandchildren.  She is Jewish and corrected me that it's Hannukah not Christmas for her.    ADVANCED DIRECTIVES: In place   HEALTH MAINTENANCE:  Social History   Tobacco Use  . Smoking status: Former Smoker    Quit date: 05/22/1968    Years since quitting: 52.3  . Smokeless tobacco: Never Used  Vaping Use  . Vaping Use: Never used  Substance Use Topics  . Alcohol use: Yes    Comment: 2 yearly  . Drug use: No     Colonoscopy:  PAP: s/p remote hysterectomy  Bone density:  10/23/2011, normal  Lipid panel: Dr. Inda Merlin, UTD   Allergies  Allergen Reactions  . Cantaloupe (Diagnostic)   . Ciprofloxacin   . Epinephrine   . Pineapple   . Tamoxifen   . Mupirocin Rash    Current Outpatient Medications   Medication Sig Dispense Refill  . Calcium Carbonate-Vitamin D (CALCIUM-VITAMIN D) 500-200 MG-UNIT per tablet Take 1 tablet by mouth 2 (two) times daily with a meal.    . cholecalciferol (VITAMIN D) 400 UNITS TABS Take 2,000 Units by mouth daily.    Marland Kitchen esomeprazole (NEXIUM) 20 MG capsule Take 1 capsule (20 mg total) by mouth as needed.    . fluconazole (DIFLUCAN) 150 MG tablet Take 150 mg by mouth as needed.     . gabapentin (NEURONTIN) 100 MG capsule TAKE ONE CAPSULE BY MOUTH EVERY NIGHT AT BEDTIME, AND MAY INCREASE TO TAKING ONE CAPSULE TWICE DAILY IF NEEDED THEREAFTER 60 capsule 2  . ketoconazole (NIZORAL) 2 % cream Apply 1 application topically daily as needed for irritation.     Marland Kitchen ketotifen (ZADITOR) 0.025 % ophthalmic solution Place 1 drop into both eyes as needed (itchy eyes).    Marland Kitchen letrozole (FEMARA) 2.5 MG tablet Take 1 tablet (2.5 mg total) by mouth daily. 90 tablet 4  . levothyroxine (SYNTHROID, LEVOTHROID) 25 MCG tablet Take 25 mcg by mouth daily.    Marland Kitchen losartan (COZAAR) 50 MG tablet Take 50 mg by mouth daily.     . metroNIDAZOLE (METROGEL) 0.75 % gel Apply topically 2 (two) times daily. For rosacea    . Multiple Vitamin (MULTIVITAMIN) tablet Take 1 tablet by mouth daily.    . nitroGLYCERIN (NITRODUR - DOSED IN MG/24 HR) 0.2 mg/hr patch Place 1 patch (0.2 mg total) onto the skin daily. 30 patch 12  . propranolol ER (INDERAL LA) 60 MG 24 hr capsule Take 60 mg by mouth at bedtime.     . ribociclib succ (KISQALI 200MG DAILY DOSE) 200 MG Therapy Pack TAKE 1 TABLET (200 MG TOTAL) BY MOUTH DAILY. TAKE FOR 21 DAYS ON, 7 DAYS OFF, REPEAT EVERY 28 DAYS. 21 each 6  . ribociclib succ (KISQALI 200MG DAILY DOSE) 200 MG Therapy Pack TAKE 1 TABLET (200 MG TOTAL) BY MOUTH DAILY. TAKE FOR 21 DAYS ON, 7 DAYS OFF, REPEAT EVERY 28 DAYS. 21 each 6   No current facility-administered medications for this visit.    OBJECTIVE: white woman examined in a wheelchair Vitals:   09/09/20 1111  BP: (!) 177/62   Pulse: 73  Resp: 19  Temp: 97.6 F (36.4 C)  SpO2: 94%   Body mass index is 27.69 kg/m.  Filed Weights   09/09/20 1111  Weight: 161 lb 4.8 oz (73.2 kg)  Sclerae unicteric, EOMs intact Wearing a mask No cervical or supraclavicular adenopathy Lungs no rales or rhonchi Heart regular rate and rhythm Abd soft, nontender, positive bowel sounds MSK no focal spinal tenderness, no upper extremity lymphedema Neuro: nonfocal, positive and friendly affect Breasts: Right breast is unremarkable.  The left breast is status post prior surgery, with no evidence of local recurrence.  Both axillae are benign.   LAB RESULTS: Lab Results  Component Value Date   WBC 6.9 09/09/2020   NEUTROABS 5.3 09/09/2020   HGB 11.8 (L) 09/09/2020   HCT 35.3 (L) 09/09/2020   MCV 95.7 09/09/2020   PLT 252 09/09/2020      Chemistry      Component Value Date/Time   NA 140 03/11/2020 1104   NA 129 (L) 05/07/2017 1045   K 4.9 03/11/2020 1104   K 5.0 05/07/2017 1045   CL 107 03/11/2020 1104   CL 104 10/24/2012 1014   CO2 26 03/11/2020 1104   CO2 25 05/07/2017 1045   BUN 33 (H) 03/11/2020 1104   BUN 17.6 05/07/2017 1045   CREATININE 1.39 (H) 03/11/2020 1104   CREATININE 1.1 05/07/2017 1045      Component Value Date/Time   CALCIUM 9.8 03/11/2020 1104   CALCIUM 9.3 05/07/2017 1045   ALKPHOS 56 03/11/2020 1104   ALKPHOS 37 (L) 05/07/2017 1045   AST 18 03/11/2020 1104   AST 20 05/07/2017 1045   ALT 12 03/11/2020 1104   ALT 13 05/07/2017 1045   BILITOT 0.6 03/11/2020 1104   BILITOT 1.55 (H) 05/07/2017 1045      IMAGING STUDIES: US Guided Needle Placement - No Linked Charges  Result Date: 08/24/2020 Ultrasound guided injection is preferred based studies that show increased duration, increased effect, greater accuracy, decreased procedural pain, increased response rate, and decreased cost with ultrasound guided versus blind injection.   Verbal informed consent obtained.  Time-out conducted.   Noted no overlying erythema, induration, or other signs of local infection. Ultrasound-guided right hip injection: After sterile prep with Betadine, injected 4 cc 0.25% bupivacaine without epinephrine and 6 mg betamethasone using a 22-gauge spinal needle, passing the needle through the iliofemoral ligament into the femoral head/neck junction.  Injected the same into the greater trochanter.  Complete relief during the anesthetic phase.  XR HIP UNILAT W OR W/O PELVIS 2-3 VIEWS RIGHT  Result Date: 08/24/2020 Degenerative changes of the right hip joint.  Small cortical irregularities around the greater trochanter region.  No obvious evidence of metastatic lesions.     ASSESSMENT: 87 y.o. BRCA negative [place] woman:  (1) Status post left lumpectomy in 1996 for a T1b N0 grade 2 invasive ductal carcinoma which was strongly estrogen and progesterone receptor positive, treated with radiation, then anastrozole, then Evista, all treatment discontinued July 2012  METASTATIC DISEASE: July 2012 (2) recurrence to the lining of the left lung and liver documented by liver biopsy July 2012, showing metastatic adenocarcinoma estrogen and progesterone receptor positive at 96% and 97% respectively; there was no Her-2 amplification.   (a) left axillary lymph node biopsy 09/27/2016 shows breast cancer, 95% estrogen repeat receptor positive with strong staining intensity, progesterone and HER-2 negative.  (3)  She has been on exemestane since early July of 2012, with chest CT scan April 2013 showing complete resolution of her lung and liver metastases  (a) bone density in June of 08/09/2011 was normal  (b) bone density 03/20/2016 was normal with a T score of -0.6  (c) exemestane continued at the  time of disease progression noted May 2018  (d) exemestane discontinued April 2019  (4) MRI of the lumbar spine 09-2012 showed significant degenerative disease but no evidence of metastatic disease to bone.  (5) disease  progression noted May 2018: CT scan shows liver, lung, and bone lesions  (6) fulvestrant started 09/26/2016, palbociclib added 10/24/2016 at 125 mg daily, 21 days on. 7 days off  (a) palbociclib dose decreased to 172m daily on 02/14/2017  (b) PET scan 10/16/2018 shows progression in the liver no new sites of disease  (c) fulvestrant discontinued; letrozole substituted  to minimize visits during the pandemic  (d) palbociclib discontinued, ribociclib substituted  (e) consider consult to interventional radiology for Yttrium treatment of liver lesion  (7) denosumab/Xgeva started 11/21/2016, repeated every 28 days until 11/19/2017, then every 8 weeks thereafter  (a) Xgeva changed to every 12 weeks beginning with the 05/06/2018 dose  (b) Xgeva held after the October 2020 dose because of the pandemic  (c) denosumab resumed 08/28/2019, now repeated every 6 months  (8) Caris requested 10/21/2018, from 09/27/2016 lymph node biopsy  (a) patient is BRCA `1-2 negative   PLAN:  RAbelinais now just about 10 years out from definitive diagnosis of metastatic breast cancer.  Her disease continues to be clinically well controlled.  She is tolerating treatment well.  The plan accordingly is to continue the letrozole and ribociclib.  She tolerates the denosumab/Xgeva well.  She will receive a dose today and again in 6 months.  We have a CA 27-29 pending from today.  If that is markedly elevated we will consider a CT of the chest or liver MRI for reassessment.  Certainly we do not want to continue treatment if it is not working.  On the other hand right now she is tolerating therapy and it appears to be controlling the tumor.  Her daughter has been trying to find out what the patient would want to do if we find disease progression.  The patient says she does not know and that is probably a very fair answer.  She will be treated today she will see me again in 6 months.  They know to call for any other issue that may  develop before then  Total encounter time 25 minutes.*Sarajane JewsC. Mardie Kellen, MD 09/09/20 11:48 AM Medical Oncology [facility] H[facility] [id number]. [phone]   Fax. [phone]  I, KWilburn Mylar am acting as scribe for Dr. GVirgie Dad Juriel Cid.  I, GLurline DelMD, have reviewed the above documentation for accuracy and completeness, and I agree with the above.   *Total Encounter Time as defined by the Centers for Medicare and Medicaid Services includes, in addition to the face-to-face time of a patient visit (documented in the note above) non-face-to-face time: obtaining and reviewing outside history, ordering and reviewing medications, tests or procedures, care coordination (communications with other health care professionals or caregivers) and documentation in the medical record.

## 2020-09-09 ENCOUNTER — Inpatient Hospital Stay: Payer: PPO

## 2020-09-09 ENCOUNTER — Other Ambulatory Visit: Payer: Self-pay

## 2020-09-09 ENCOUNTER — Inpatient Hospital Stay: Payer: PPO | Attending: Oncology | Admitting: Oncology

## 2020-09-09 VITALS — BP 177/62 | HR 73 | Temp 97.6°F | Resp 19 | Ht 64.0 in | Wt 161.3 lb

## 2020-09-09 DIAGNOSIS — C50812 Malignant neoplasm of overlapping sites of left female breast: Secondary | ICD-10-CM

## 2020-09-09 DIAGNOSIS — Z170 Estrogen receptor positive status [ER+]: Secondary | ICD-10-CM | POA: Diagnosis not present

## 2020-09-09 DIAGNOSIS — E7800 Pure hypercholesterolemia, unspecified: Secondary | ICD-10-CM | POA: Insufficient documentation

## 2020-09-09 DIAGNOSIS — C7801 Secondary malignant neoplasm of right lung: Secondary | ICD-10-CM | POA: Insufficient documentation

## 2020-09-09 DIAGNOSIS — J410 Simple chronic bronchitis: Secondary | ICD-10-CM

## 2020-09-09 DIAGNOSIS — Z79811 Long term (current) use of aromatase inhibitors: Secondary | ICD-10-CM | POA: Diagnosis not present

## 2020-09-09 DIAGNOSIS — K219 Gastro-esophageal reflux disease without esophagitis: Secondary | ICD-10-CM | POA: Insufficient documentation

## 2020-09-09 DIAGNOSIS — Z87891 Personal history of nicotine dependence: Secondary | ICD-10-CM | POA: Diagnosis not present

## 2020-09-09 DIAGNOSIS — C50912 Malignant neoplasm of unspecified site of left female breast: Secondary | ICD-10-CM | POA: Diagnosis not present

## 2020-09-09 DIAGNOSIS — C787 Secondary malignant neoplasm of liver and intrahepatic bile duct: Secondary | ICD-10-CM | POA: Insufficient documentation

## 2020-09-09 DIAGNOSIS — J90 Pleural effusion, not elsewhere classified: Secondary | ICD-10-CM | POA: Insufficient documentation

## 2020-09-09 DIAGNOSIS — M25559 Pain in unspecified hip: Secondary | ICD-10-CM | POA: Diagnosis not present

## 2020-09-09 DIAGNOSIS — R5383 Other fatigue: Secondary | ICD-10-CM | POA: Diagnosis not present

## 2020-09-09 DIAGNOSIS — Z79899 Other long term (current) drug therapy: Secondary | ICD-10-CM | POA: Insufficient documentation

## 2020-09-09 DIAGNOSIS — C50911 Malignant neoplasm of unspecified site of right female breast: Secondary | ICD-10-CM | POA: Diagnosis not present

## 2020-09-09 DIAGNOSIS — I10 Essential (primary) hypertension: Secondary | ICD-10-CM | POA: Insufficient documentation

## 2020-09-09 DIAGNOSIS — C7951 Secondary malignant neoplasm of bone: Secondary | ICD-10-CM | POA: Insufficient documentation

## 2020-09-09 DIAGNOSIS — Z803 Family history of malignant neoplasm of breast: Secondary | ICD-10-CM | POA: Diagnosis not present

## 2020-09-09 DIAGNOSIS — C50922 Malignant neoplasm of unspecified site of left male breast: Secondary | ICD-10-CM

## 2020-09-09 DIAGNOSIS — C50919 Malignant neoplasm of unspecified site of unspecified female breast: Secondary | ICD-10-CM

## 2020-09-09 LAB — CBC WITH DIFFERENTIAL/PLATELET
Abs Immature Granulocytes: 0.06 10*3/uL (ref 0.00–0.07)
Basophils Absolute: 0.1 10*3/uL (ref 0.0–0.1)
Basophils Relative: 1 %
Eosinophils Absolute: 0.2 10*3/uL (ref 0.0–0.5)
Eosinophils Relative: 3 %
HCT: 35.3 % — ABNORMAL LOW (ref 36.0–46.0)
Hemoglobin: 11.8 g/dL — ABNORMAL LOW (ref 12.0–15.0)
Immature Granulocytes: 1 %
Lymphocytes Relative: 15 %
Lymphs Abs: 1 10*3/uL (ref 0.7–4.0)
MCH: 32 pg (ref 26.0–34.0)
MCHC: 33.4 g/dL (ref 30.0–36.0)
MCV: 95.7 fL (ref 80.0–100.0)
Monocytes Absolute: 0.3 10*3/uL (ref 0.1–1.0)
Monocytes Relative: 5 %
Neutro Abs: 5.3 10*3/uL (ref 1.7–7.7)
Neutrophils Relative %: 75 %
Platelets: 252 10*3/uL (ref 150–400)
RBC: 3.69 MIL/uL — ABNORMAL LOW (ref 3.87–5.11)
RDW: 13.3 % (ref 11.5–15.5)
WBC: 6.9 10*3/uL (ref 4.0–10.5)
nRBC: 0 % (ref 0.0–0.2)

## 2020-09-09 LAB — COMPREHENSIVE METABOLIC PANEL
ALT: 10 U/L (ref 0–44)
AST: 16 U/L (ref 15–41)
Albumin: 3.9 g/dL (ref 3.5–5.0)
Alkaline Phosphatase: 68 U/L (ref 38–126)
Anion gap: 11 (ref 5–15)
BUN: 24 mg/dL — ABNORMAL HIGH (ref 8–23)
CO2: 28 mmol/L (ref 22–32)
Calcium: 9.2 mg/dL (ref 8.9–10.3)
Chloride: 100 mmol/L (ref 98–111)
Creatinine, Ser: 1.59 mg/dL — ABNORMAL HIGH (ref 0.44–1.00)
GFR, Estimated: 30 mL/min — ABNORMAL LOW (ref >60)
Glucose, Bld: 106 mg/dL — ABNORMAL HIGH (ref 70–99)
Potassium: 5.1 mmol/L (ref 3.5–5.1)
Sodium: 139 mmol/L (ref 135–145)
Total Bilirubin: 1 mg/dL (ref 0.3–1.2)
Total Protein: 6.6 g/dL (ref 6.5–8.1)

## 2020-09-09 MED ORDER — DENOSUMAB 120 MG/1.7ML ~~LOC~~ SOLN
SUBCUTANEOUS | Status: AC
  Filled 2020-09-09: qty 1.7

## 2020-09-09 MED ORDER — DENOSUMAB 120 MG/1.7ML ~~LOC~~ SOLN
120.0000 mg | Freq: Once | SUBCUTANEOUS | Status: AC
  Administered 2020-09-09: 120 mg via SUBCUTANEOUS

## 2020-09-10 LAB — CANCER ANTIGEN 27.29: CA 27.29: 516.4 U/mL — ABNORMAL HIGH (ref 0.0–38.6)

## 2020-09-13 ENCOUNTER — Encounter: Payer: Self-pay | Admitting: Oncology

## 2020-09-16 ENCOUNTER — Other Ambulatory Visit (HOSPITAL_COMMUNITY): Payer: Self-pay

## 2020-09-16 MED FILL — Ribociclib Succinate Tab Pack 200 MG Daily Dose: ORAL | 28 days supply | Qty: 21 | Fill #0 | Status: AC

## 2020-09-20 ENCOUNTER — Other Ambulatory Visit (HOSPITAL_COMMUNITY): Payer: Self-pay

## 2020-09-23 DIAGNOSIS — E782 Mixed hyperlipidemia: Secondary | ICD-10-CM | POA: Diagnosis not present

## 2020-09-23 DIAGNOSIS — C50912 Malignant neoplasm of unspecified site of left female breast: Secondary | ICD-10-CM | POA: Diagnosis not present

## 2020-09-23 DIAGNOSIS — K219 Gastro-esophageal reflux disease without esophagitis: Secondary | ICD-10-CM | POA: Diagnosis not present

## 2020-09-23 DIAGNOSIS — F4321 Adjustment disorder with depressed mood: Secondary | ICD-10-CM | POA: Diagnosis not present

## 2020-09-23 DIAGNOSIS — C50919 Malignant neoplasm of unspecified site of unspecified female breast: Secondary | ICD-10-CM | POA: Diagnosis not present

## 2020-09-23 DIAGNOSIS — J45991 Cough variant asthma: Secondary | ICD-10-CM | POA: Diagnosis not present

## 2020-09-23 DIAGNOSIS — I10 Essential (primary) hypertension: Secondary | ICD-10-CM | POA: Diagnosis not present

## 2020-09-23 DIAGNOSIS — E039 Hypothyroidism, unspecified: Secondary | ICD-10-CM | POA: Diagnosis not present

## 2020-09-30 ENCOUNTER — Encounter (HOSPITAL_COMMUNITY): Payer: Self-pay

## 2020-09-30 ENCOUNTER — Emergency Department (HOSPITAL_COMMUNITY): Payer: PPO

## 2020-09-30 ENCOUNTER — Other Ambulatory Visit: Payer: Self-pay

## 2020-09-30 ENCOUNTER — Emergency Department (HOSPITAL_COMMUNITY)
Admission: EM | Admit: 2020-09-30 | Discharge: 2020-09-30 | Disposition: A | Discharge status: Home / Self Care | Payer: PPO | Attending: Emergency Medicine | Admitting: Emergency Medicine

## 2020-09-30 DIAGNOSIS — R059 Cough, unspecified: Secondary | ICD-10-CM | POA: Insufficient documentation

## 2020-09-30 DIAGNOSIS — R0602 Shortness of breath: Secondary | ICD-10-CM | POA: Diagnosis not present

## 2020-09-30 DIAGNOSIS — J449 Chronic obstructive pulmonary disease, unspecified: Secondary | ICD-10-CM | POA: Diagnosis not present

## 2020-09-30 DIAGNOSIS — Z853 Personal history of malignant neoplasm of breast: Secondary | ICD-10-CM | POA: Diagnosis not present

## 2020-09-30 DIAGNOSIS — Z87891 Personal history of nicotine dependence: Secondary | ICD-10-CM | POA: Insufficient documentation

## 2020-09-30 DIAGNOSIS — R0981 Nasal congestion: Secondary | ICD-10-CM | POA: Insufficient documentation

## 2020-09-30 DIAGNOSIS — I2510 Atherosclerotic heart disease of native coronary artery without angina pectoris: Secondary | ICD-10-CM | POA: Diagnosis not present

## 2020-09-30 DIAGNOSIS — R509 Fever, unspecified: Secondary | ICD-10-CM | POA: Insufficient documentation

## 2020-09-30 DIAGNOSIS — J9811 Atelectasis: Secondary | ICD-10-CM | POA: Diagnosis not present

## 2020-09-30 DIAGNOSIS — J90 Pleural effusion, not elsewhere classified: Secondary | ICD-10-CM | POA: Diagnosis not present

## 2020-09-30 DIAGNOSIS — I129 Hypertensive chronic kidney disease with stage 1 through stage 4 chronic kidney disease, or unspecified chronic kidney disease: Secondary | ICD-10-CM | POA: Diagnosis not present

## 2020-09-30 DIAGNOSIS — Z20822 Contact with and (suspected) exposure to covid-19: Secondary | ICD-10-CM | POA: Insufficient documentation

## 2020-09-30 DIAGNOSIS — J069 Acute upper respiratory infection, unspecified: Secondary | ICD-10-CM | POA: Diagnosis not present

## 2020-09-30 DIAGNOSIS — N183 Chronic kidney disease, stage 3 unspecified: Secondary | ICD-10-CM | POA: Insufficient documentation

## 2020-09-30 DIAGNOSIS — Z03818 Encounter for observation for suspected exposure to other biological agents ruled out: Secondary | ICD-10-CM | POA: Diagnosis not present

## 2020-09-30 DIAGNOSIS — I700 Atherosclerosis of aorta: Secondary | ICD-10-CM | POA: Diagnosis not present

## 2020-09-30 DIAGNOSIS — R0902 Hypoxemia: Secondary | ICD-10-CM | POA: Diagnosis not present

## 2020-09-30 DIAGNOSIS — Z79899 Other long term (current) drug therapy: Secondary | ICD-10-CM | POA: Diagnosis not present

## 2020-09-30 LAB — CBC WITH DIFFERENTIAL/PLATELET
Abs Immature Granulocytes: 0.04 10*3/uL (ref 0.00–0.07)
Basophils Absolute: 0.1 10*3/uL (ref 0.0–0.1)
Basophils Relative: 1 %
Eosinophils Absolute: 0.2 10*3/uL (ref 0.0–0.5)
Eosinophils Relative: 2 %
HCT: 36.8 % (ref 36.0–46.0)
Hemoglobin: 12.2 g/dL (ref 12.0–15.0)
Immature Granulocytes: 1 %
Lymphocytes Relative: 10 %
Lymphs Abs: 0.8 10*3/uL (ref 0.7–4.0)
MCH: 31.6 pg (ref 26.0–34.0)
MCHC: 33.2 g/dL (ref 30.0–36.0)
MCV: 95.3 fL (ref 80.0–100.0)
Monocytes Absolute: 0.5 10*3/uL (ref 0.1–1.0)
Monocytes Relative: 6 %
Neutro Abs: 6.6 10*3/uL (ref 1.7–7.7)
Neutrophils Relative %: 80 %
Platelets: 272 10*3/uL (ref 150–400)
RBC: 3.86 MIL/uL — ABNORMAL LOW (ref 3.87–5.11)
RDW: 13.2 % (ref 11.5–15.5)
WBC: 8.2 10*3/uL (ref 4.0–10.5)
nRBC: 0 % (ref 0.0–0.2)

## 2020-09-30 LAB — URINALYSIS, ROUTINE W REFLEX MICROSCOPIC
Bacteria, UA: NONE SEEN
Bilirubin Urine: NEGATIVE
Glucose, UA: NEGATIVE mg/dL
Hgb urine dipstick: NEGATIVE
Ketones, ur: NEGATIVE mg/dL
Leukocytes,Ua: NEGATIVE
Nitrite: NEGATIVE
Protein, ur: 30 mg/dL — AB
Specific Gravity, Urine: 1.01 (ref 1.005–1.030)
pH: 5 (ref 5.0–8.0)

## 2020-09-30 LAB — COMPREHENSIVE METABOLIC PANEL
ALT: 17 U/L (ref 0–44)
AST: 21 U/L (ref 15–41)
Albumin: 4.1 g/dL (ref 3.5–5.0)
Alkaline Phosphatase: 78 U/L (ref 38–126)
Anion gap: 7 (ref 5–15)
BUN: 27 mg/dL — ABNORMAL HIGH (ref 8–23)
CO2: 26 mmol/L (ref 22–32)
Calcium: 9.2 mg/dL (ref 8.9–10.3)
Chloride: 98 mmol/L (ref 98–111)
Creatinine, Ser: 1.38 mg/dL — ABNORMAL HIGH (ref 0.44–1.00)
GFR, Estimated: 35 mL/min — ABNORMAL LOW (ref >60)
Glucose, Bld: 124 mg/dL — ABNORMAL HIGH (ref 70–99)
Potassium: 4.6 mmol/L (ref 3.5–5.1)
Sodium: 131 mmol/L — ABNORMAL LOW (ref 135–145)
Total Bilirubin: 1.1 mg/dL (ref 0.3–1.2)
Total Protein: 7.6 g/dL (ref 6.5–8.1)

## 2020-09-30 LAB — POC SARS CORONAVIRUS 2 AG -  ED: SARSCOV2ONAVIRUS 2 AG: NEGATIVE

## 2020-09-30 LAB — RESP PANEL BY RT-PCR (FLU A&B, COVID) ARPGX2
Influenza A by PCR: NEGATIVE
Influenza B by PCR: NEGATIVE
SARS Coronavirus 2 by RT PCR: NEGATIVE

## 2020-09-30 MED ORDER — HYDROCODONE BIT-HOMATROP MBR 5-1.5 MG/5ML PO SOLN: 5.0000 mL | Freq: Four times a day (QID) | ORAL | 0 refills | Status: DC | PRN

## 2020-09-30 MED ORDER — HYDROCODONE BIT-HOMATROP MBR 5-1.5 MG/5ML PO SOLN
5.0000 mL | Freq: Once | ORAL | Status: AC
  Administered 2020-09-30: 5 mL via ORAL
  Filled 2020-09-30 (×2): qty 5

## 2020-09-30 NOTE — ED Provider Notes (Signed)
[facility] DEPT Provider Note   CSN: [id number] Arrival date & time: 09/30/20  1553     History Chief Complaint  Patient presents with  . Cough    Theresa Wallace is a 87 y.o. female.  HPI Patient presents with her daughter who assists with the history. Patient has history of multiple medical issues including breast cancer, pleural effusion.  She presents with 4 days of cough, congestion. During this illness, she has had no relief with Mucinex, Tylenol, honey. Today she went to urgent care, and was sent here after x-ray demonstrated pleural effusion. There is low-grade fever, though nothing more substantial.  Patient denies pain.    Past Medical History:  Diagnosis Date  . Breast cancer metastasized to multiple sites ([place]) 06/07/2011  . Cancer ([place])    breast ca  . GERD (gastroesophageal reflux disease)   . Hypercholesterolemia   . Hypertension   . Irritable bowel syndrome (IBS)   . Onychomycosis   . Sigmoid diverticulosis     Patient Active Problem List   Diagnosis Date Noted  . CKD (chronic kidney disease), stage III ([place]) 04/03/2018  . Essential hypertension 04/03/2018  . Hyponatremia 04/01/2018  . Goals of care, counseling/discussion 08/27/2017  . Malignant neoplasm of overlapping sites of left breast in female, estrogen receptor positive ([place]) 09/11/2016  . Cancer of left breast ([place]) 09/13/2015  . Sensorineural hearing loss (SNHL), bilateral 08/23/2015  . Bilateral impacted cerumen 08/23/2015  . Genetic testing 12/18/2014  . COPD (chronic obstructive pulmonary disease) ([place]) 09/07/2014  . Breast cancer metastasized to multiple sites [facility]) 06/07/2011    Past Surgical History:  Procedure Laterality Date  . ABDOMINAL HYSTERECTOMY    . BREAST LUMPECTOMY    . CATARACT EXTRACTION    . CHOLECYSTECTOMY    . KNEE ARTHROSCOPY       OB History   No obstetric history on file.     Family History  Problem Relation Age of Onset  .  Breast cancer Sister 23  . Breast cancer Cousin        dx in her 76s  . Bone cancer Mother     Social History   Tobacco Use  . Smoking status: Former Smoker    Quit date: 05/22/1968    Years since quitting: 52.3  . Smokeless tobacco: Never Used  Vaping Use  . Vaping Use: Never used  Substance Use Topics  . Alcohol use: Yes    Comment: 2 yearly  . Drug use: No    Home Medications Prior to Admission medications   Medication Sig Start Date End Date Taking? Authorizing Provider  Calcium Carbonate-Vitamin D (CALCIUM-VITAMIN D) 500-200 MG-UNIT per tablet Take 1 tablet by mouth 2 (two) times daily with a meal.    [provider]  cholecalciferol (VITAMIN D) 400 UNITS TABS Take 2,000 Units by mouth daily.    [provider]  esomeprazole (NEXIUM) 20 MG capsule Take 1 capsule (20 mg total) by mouth as needed. 07/30/17   Magrinat, Virgie Dad, MD  fluconazole (DIFLUCAN) 150 MG tablet Take 150 mg by mouth as needed.  08/13/15   [provider]  gabapentin (NEURONTIN) 100 MG capsule TAKE ONE CAPSULE BY MOUTH EVERY NIGHT AT BEDTIME, AND MAY INCREASE TO TAKING ONE CAPSULE TWICE DAILY IF NEEDED THEREAFTER 08/31/20   Magrinat, Virgie Dad, MD  ketoconazole (NIZORAL) 2 % cream Apply 1 application topically daily as needed for irritation.  12/28/17   [provider]  ketotifen (ZADITOR) 0.025 %  ophthalmic solution Place 1 drop into both eyes as needed (itchy eyes).    [provider]  letrozole (FEMARA) 2.5 MG tablet Take 1 tablet (2.5 mg total) by mouth daily. 03/11/20   Magrinat, Virgie Dad, MD  levothyroxine (SYNTHROID, LEVOTHROID) 25 MCG tablet Take 25 mcg by mouth daily.    [name], MD  losartan (COZAAR) 50 MG tablet Take 50 mg by mouth daily.  10/17/12   [provider]  metroNIDAZOLE (METROGEL) 0.75 % gel Apply topically 2 (two) times daily. For rosacea 12/27/15   [provider]  Multiple Vitamin (MULTIVITAMIN) tablet Take 1 tablet by mouth  daily.    [provider]  nitroGLYCERIN (NITRODUR - DOSED IN MG/24 HR) 0.2 mg/hr patch Place 1 patch (0.2 mg total) onto the skin daily. 10/07/19   Newt Minion, MD  propranolol ER (INDERAL LA) 60 MG 24 hr capsule Take 60 mg by mouth at bedtime.  02/19/13   [provider]  ribociclib succ Thelma Comp 200MG  DAILY DOSE) 200 MG Therapy Pack TAKE 1 TABLET (200 MG TOTAL) BY MOUTH DAILY. TAKE FOR 21 DAYS ON, 7 DAYS OFF, REPEAT EVERY 28 DAYS. 03/11/20 03/11/21  Magrinat, Virgie Dad, MD  ribociclib succ [facility] 200MG  DAILY DOSE) 200 MG Therapy Pack TAKE 1 TABLET (200 MG TOTAL) BY MOUTH DAILY. TAKE FOR 21 DAYS ON, 7 DAYS OFF, REPEAT EVERY 28 DAYS. 12/10/19 12/09/20  Magrinat, Virgie Dad, MD    Allergies    Cantaloupe (diagnostic), Cantaloupe extract allergy skin test, Ciprofloxacin, Epinephrine, Pineapple, Tamoxifen, and Mupirocin  Review of Systems   Review of Systems  Constitutional:       Per HPI, otherwise negative  HENT:       Per HPI, otherwise negative  Respiratory:       Per HPI, otherwise negative  Cardiovascular:       Per HPI, otherwise negative  Gastrointestinal: Negative for vomiting.  Endocrine:       Negative aside from HPI  Genitourinary:       Neg aside from HPI   Musculoskeletal:       Per HPI, otherwise negative  Skin: Negative.   Neurological: Negative for syncope.    Physical Exam Updated Vital Signs BP (!) 166/95   Pulse 81   Temp 98.3 F (36.8 C) (Oral)   Resp 19   Ht 5\' 4"  (1.626 m)   Wt 72.6 kg   SpO2 96%   BMI 27.46 kg/m   Physical Exam Vitals and nursing note reviewed.  Constitutional:      General: She is not in acute distress.    Appearance: She is well-developed.  HENT:     Head: Normocephalic and atraumatic.     Comments: Hearing aids bilaterally Eyes:     Conjunctiva/sclera: Conjunctivae normal.  Cardiovascular:     Rate and Rhythm: Normal rate and regular rhythm.  Pulmonary:     Effort: Pulmonary effort is normal.      Breath sounds: Rhonchi present.  Abdominal:     General: There is no distension.  Skin:    General: Skin is warm and dry.  Neurological:     Mental Status: She is alert and oriented to person, place, and time.     Cranial Nerves: No cranial nerve deficit.     ED Results / Procedures / Treatments   Labs (all labs ordered are listed, but only abnormal results are displayed) Labs Reviewed  CBC WITH DIFFERENTIAL/PLATELET - Abnormal; Notable for the following components:  Result Value   RBC 3.86 (*)    All other components within normal limits  COMPREHENSIVE METABOLIC PANEL - Abnormal; Notable for the following components:   Sodium 131 (*)    Glucose, Bld 124 (*)    BUN 27 (*)    Creatinine, Ser 1.38 (*)    GFR, Estimated 35 (*)    All other components within normal limits  RESP PANEL BY RT-PCR (FLU A&B, COVID) ARPGX2  URINALYSIS, ROUTINE W REFLEX MICROSCOPIC  POC SARS CORONAVIRUS 2 AG -  ED    EKG None  Radiology DG Chest 2 View  Result Date: 09/30/2020 CLINICAL DATA:  Cough. EXAM: CHEST - 2 VIEW COMPARISON:  April 10, 2019 FINDINGS: There is a small left effusion with associated atelectasis. No pneumothorax. No nodules or masses. The heart, hila, mediastinum, lungs, and pleura are otherwise unremarkable. IMPRESSION: Small left pleural effusion with underlying atelectasis. No other abnormalities. Electronically Signed   By: Dorise Bullion III M.D   On: 09/30/2020 17:00   CT Chest Wo Contrast  Result Date: 09/30/2020 CLINICAL DATA:  History of breast carcinoma with metastatic disease and findings of pleural effusion on recent chest x-ray EXAM: CT CHEST WITHOUT CONTRAST TECHNIQUE: Multidetector CT imaging of the chest was performed following the standard protocol without IV contrast. COMPARISON:  04/21/19 PET-CT FINDINGS: Cardiovascular: Limited due to lack of IV contrast. Atherosclerotic calcifications of the thoracic aorta and its branches are seen. No aneurysmal  dilatation is noted. No cardiac enlargement is seen. Mild coronary calcifications are noted. The pulmonary artery is not significantly enlarged. Mediastinum/Nodes: Thoracic inlet shows no acute abnormality. No sizable hilar or mediastinal adenopathy is noted. The esophagus as visualized is within normal limits. Lungs/Pleura: Nodularity is noted along the pleura on the left increased in appearance when compared with the prior PET-CT likely representing progressive pleural metastatic disease. Some nodularity along the right hemidiaphragm is noted as well suspicious for increased pleural metastatic disease. Small left-sided pleural effusion is noted. 12 mm nodular density is noted along the anterior aspect of the right hemidiaphragm suspicious for lung metastatic disease. This has increased in size from the prior PET-CT of which time it measured 7 mm. Upper Abdomen: Visualized upper abdomen demonstrates lesions within the liver. The overall appearance is similar to that seen on the prior PET-CT examination. Musculoskeletal: Stable sclerotic focus in the lower thoracic spine is noted. No new lytic or sclerotic lesions are identified within the rib cage or thoracic spine. Chronic compression deformity is noted at T11 and L1. Nodularity is noted in the left axilla measuring up to 19 mm increased in size when compared with the prior exam at which time it measured 12 mm. Dense breast tissue is noted. Calcifications are seen within the left breast and stable. IMPRESSION: Increase in pleural nodularity particularly on the left consistent with progressive metastatic disease. Enlarging right middle lobe nodule is seen suggestive of progressive metastatic disease. Increasing soft tissue density in the left axilla is noted also likely representing some focal progression. Stable hypodense lesions are noted within the liver both in the dome and posterior right hepatic lobe. These are unchanged from the prior PET-CT and consistent  with metastatic disease. Aortic Atherosclerosis (ICD10-I70.0). Electronically Signed   By: Inez Catalina M.D.   On: 09/30/2020 17:54    Procedures Procedures   Medications Ordered in ED Medications - No data to display  ED Course  I have reviewed the triage vital signs and the nursing notes.  Pertinent labs &  imaging results that were available during my care of the patient were reviewed by me and considered in my medical decision making (see chart for details).    7:34 PM On repeat exam the patient remains in similar condition, smiling, agreeable.  No decompensation.  I have discussed, after reviewing, CT, x-ray, results with the patient and her daughter. With concern for progression of disease, patient will follow up with her oncologist. With no new oxygen requirement, no evidence for substantial pleural effusion requiring drainage, no evidence for pneumonia, and without chest pain suggesting atypical ACS, close outpatient follow-up is reasonable. In the interim, patient will continue symptomatic therapy.  We discussed additional meds for comfort, given patient's advanced age, consideration of palliative effects of antitussive, sleep, patient will start a course of Hycodan. MDM Rules/Calculators/A&P MDM Number of Diagnoses or Management Options SOB (shortness of breath): new, needed workup   Amount and/or Complexity of Data Reviewed Clinical lab tests: ordered and reviewed Tests in the radiology section of CPT: ordered and reviewed Tests in the medicine section of CPT: reviewed and ordered Decide to obtain previous medical records or to obtain history from someone other than the patient: yes Obtain history from someone other than the patient: yes Review and summarize past medical records: yes Independent visualization of images, tracings, or specimens: yes  Risk of Complications, Morbidity, and/or Mortality Presenting problems: high Diagnostic procedures: high Management  options: high  Critical Care Total time providing critical care: < 30 minutes  Patient Progress Patient progress: stable  Final Clinical Impression(s) / ED Diagnoses Final diagnoses:  SOB (shortness of breath)    Rx / DC Orders ED Discharge Orders         Ordered    HYDROcodone bit-homatropine (HYCODAN) 5-1.5 MG/5ML syrup  Every 6 hours PRN        09/30/20 1937           Carmin Muskrat, MD 09/30/20 [id number]

## 2020-09-30 NOTE — ED Provider Notes (Addendum)
Emergency Medicine Provider Triage Evaluation Note  Theresa Wallace , a 87 y.o. female  was evaluated in triage.  History of breast cancer with diastasis to pleural lining, spine, and liver.  Pt complains of 4 days.  Patient's cough is worsened over this time.  She is unable to cough up any of the mucus.  Patient has had temperature of 99.9 F at home.  Patient is currently receiving chemotherapy for her cancer.    Patient was seen at [facility] urgent care earlier today.  Family reports that chest x-ray showed pleural effusion and patient was sent to the emergency department for CT scan.  Patient has a history of dementia and seems to be "slower than normal," per daughter at bedside.  Patient lives with family members.  Review of Systems  Positive: Generalized fatigue, decreased appetite, fevers, cough, posttussive emesis Negative: Nausea, vomiting,  Physical Exam  BP (!) 163/83 (BP Location: Left Arm)   Pulse 81   Temp 98.3 F (36.8 C) (Oral)   Resp 17   Ht 5\' 4"  (1.626 m)   Wt 72.6 kg   SpO2 92%   BMI 27.46 kg/m  Gen:   Awake, no distress   Resp:  Normal effort, decreased lung sounds in bilateral lower lobes MSK:   Moves extremities without difficulty  Other:    Medical Decision Making  Medically screening exam initiated at 4:16 PM.  Appropriate orders placed.  Theresa Wallace was informed that the remainder of the evaluation will be completed by another provider, this initial triage assessment does not replace that evaluation, and the importance of remaining in the ED until their evaluation is complete.  The patient appears stable so that the remainder of the work up may be completed by another provider.      Theresa Beckwith, PA-C 09/30/20 1622    Theresa Beckwith, PA-C 09/30/20 1625    Carmin Muskrat, MD 09/30/20 [phone]

## 2020-09-30 NOTE — ED Triage Notes (Signed)
Cough with temp 99.53F at home. taking mucinex, tylenol and zyrtec with no relief. Chest xray at walk in clinic with pleural effusion. Recommended CT by walk in clinic.

## 2020-09-30 NOTE — Discharge Instructions (Signed)
As discussed, today's evaluation demonstrates likely progression of disease within your thoracic cavity.  Please be sure to follow-up with your oncologist via telephone tomorrow.  You have been prescribed a new medication that should help with cough.  However, this medication may make you more sleepy than usual.  Please take it with consideration of this.  Return here for concerning changes.

## 2020-09-30 NOTE — ED Notes (Signed)
Awaiting medication from pharmacy for dc.

## 2020-10-01 ENCOUNTER — Encounter: Payer: Self-pay | Admitting: Oncology

## 2020-10-04 ENCOUNTER — Other Ambulatory Visit: Payer: Self-pay | Admitting: Oncology

## 2020-10-04 ENCOUNTER — Telehealth: Payer: Self-pay | Admitting: Oncology

## 2020-10-04 MED ORDER — AZITHROMYCIN 250 MG PO TABS: ORAL_TABLET | ORAL | 0 refills | Status: DC

## 2020-10-04 MED ORDER — PROMETHAZINE-CODEINE 6.25-10 MG/5ML PO SYRP: 5.0000 mL | ORAL_SOLUTION | Freq: Three times a day (TID) | ORAL | 0 refills | Status: DC | PRN

## 2020-10-04 NOTE — Progress Notes (Signed)
I called [name] and discussed Theresa Wallace's situation.  She is now quite disoriented.  She has uncontrolled bouts of coughing, and is unable to eat or drink very much.  [name] agrees to a palliative care consult which we are placing.  I have stopped her blood pressure medications and her cancer treatments.  I have added added Zithromax and a Phenergan codeine syrup.  They will be starting stool softeners to make sure she continues to have bowel movements I have asked [name] to make sure the patient has a quart of liquid daily at a minimum.  We will catch up on Thursday and see whether these small changes have made any difference.  We can then go on tamoxifen alone or tamoxifen plus Verzenio.  If the patient's condition improves

## 2020-10-04 NOTE — Telephone Encounter (Signed)
Scheduled appt per sch msg. Called and spoke with patients daughter. Confirmed appt  

## 2020-10-05 ENCOUNTER — Encounter: Payer: Self-pay | Admitting: Oncology

## 2020-10-05 ENCOUNTER — Telehealth: Payer: Self-pay

## 2020-10-05 NOTE — Telephone Encounter (Signed)
Spoke with patient's daughter [name] and scheduled an in-person Palliative Consult for 11/02/20 @ 9AM  COVID screening was negative. Dog in home will put away before NP arrives. Patient lives with daughter.   Consent obtained; updated Outlook/Netsmart/Team List and Epic.   Family is aware they may be receiving a call from NP the day before or day of to confirm appointment.

## 2020-10-06 ENCOUNTER — Inpatient Hospital Stay: Payer: PPO | Attending: Oncology | Admitting: Oncology

## 2020-10-06 DIAGNOSIS — C50012 Malignant neoplasm of nipple and areola, left female breast: Secondary | ICD-10-CM

## 2020-10-06 DIAGNOSIS — C50919 Malignant neoplasm of unspecified site of unspecified female breast: Secondary | ICD-10-CM

## 2020-10-06 NOTE — Progress Notes (Signed)
ID: Theresa Wallace   DOB: 02/14/1924  MR#: [id number]  DVV#:[id number]   Patient Care Team: [name], MD as PCP - General (Internal Medicine) Jerran Tappan, Virgie Dad, MD as Consulting Physician (Oncology) Jolene Provost, PA-C as Consulting Physician (Otolaryngology) OTHER MD:  I connected with Theresa Wallace on 10/06/20 at  2:00 PM EDT by telephone visit and verified that I am speaking with the correct person using two identifiers.   I discussed the limitations, risks, security and privacy concerns of performing an evaluation and management service by telemedicine and the availability of in-person appointments. I also discussed with the patient that there may be a patient responsible charge related to this service. The patient expressed understanding and agreed to proceed.   Other persons participating in the visit and their role in the encounter: Patient's daughter Theresa Wallace Patient's location: Home Provider's location: [place]  Total time spent: 15 min   CHIEF COMPLAINT:  Metastatic Breast Cancer, estrogen receptor positive  CURRENT TREATMENT: Resuming letrozole   INTERVAL HISTORY: Theresa Wallace was contacted today for follow-up of her estrogen receptor positive metastatic breast cancer.   Since her last visit, she presented to the ED on 09/30/2020 with worsening cough and shortness of breath. Chest CT was performed at that time showed: increase in pleural nodularity particularly on the left, consistent with progressive metastatic disease; enlarging right middle lobe nodule is seen suggestive of progressive metastatic disease; increasing soft tissue density in left axilla is noted; stable hypodense lesions are noted with liver, both in dome and posterior right hepatic lobe.  I discussed Theresa Wallace's situation with daughter Theresa Wallace on 10/04/2020.  We stopped her cancer treatments and placed a referral to palliative care.  We simplified her medications, and started Zithromax and a cough  syrup to see if we could make her feel better   REVIEW OF SYSTEMS: Theresa Wallace's cough is better.  She is eating a little better as well.  She fell yesterday and EMS was called.  They checked her blood pressure which was fine and her sugar which was fine.  Her saturation was 91% on room air.  She did soil herself and had to be cleaned.  Her daughter thinks he may have had a small stroke.  At any rate at this point she is walking, with her daughter holding onto her, and is very alert and responsive.  She participated in today's visit.   COVID 19 VACCINATION STATUS: Bode x2, most recently 05/2019   HISTORY OF PRESENT ILLNESS: From the prior summary:  Theresa Wallace is an 87 y.o.  [place] woman I saw briefly when she moved to this area in 2007.  She had had a left breast cancer removed at [facility] in [place] in [phone] ([facility]).  It was grade 2, measured 1 cm maximally and was strongly estrogen and progesterone-receptor positive.  HER-2 was not checked.  This was T1b NX invasive ductal carcinoma, stage I, and she was treated after radiation with anastrozole for several years, eventually switching to Evista primarily for cost reasons.   More recently, she presented to Theresa Wallace with fatigue and a dry cough.  He auscultated dullness to percussion at the left base and obtained a chest x-ray which confirmed the presence of a left pleural effusion.   Theresa Wallace scheduled Theresa Wallace for an ultrasound-guided left thoracentesis, and this was performed November 09, 2010.  Approximately 1 L of amber-colored fluid was removed.  Cytology from this procedure ([id number]) showed only reactive mesothelial cells.  The patient was then referred here and set up for a CT of the chest and a PET scan.  The CT of the chest on June 20th showed 2 slightly enlarged left axillary lymph nodes with some irregular contours but more importantly multiple pleural nodules in the left chest measuring up to 4.6 cm.  The  right chest was normal.  There was no pericardial effusion.  There was only a small to moderate left pleural effusion remaining, and aside from left lower lobe collapse or consolidation, there was no evidence of lung parenchymal involvement.  There was no evidence of bone involvement and also no calcified pleural plaques and no worrisome bone involvement.  PET scan on June 27th showed the nodularity along the left pleura to be intensely hypermetabolic.  For example the large lobe nodule measuring 4.5 cm had an SUV max of 12.9.  There was no hypermetabolic mediastinal nodal activity.  The to left axillary lymph nodes were very mildly hypermetabolic.  The left breast was clear.  There was 1 hypermetabolic lesion in the liver measuring 3.2 cm.  On June 27th, Dr. [name] performed fine-needle aspiration of a 7 mm abnormal-appearing lymph node in the lower left axilla.  The cytology from this procedure, however, ([name]) also was inconclusive showing no evidence of nodal tissue, mostly blood and fatty tissue.   Her subsequent history is as detailed below   PAST MEDICAL HISTORY: Past Medical History:  Diagnosis Date  . Breast cancer metastasized to multiple sites ([place]) 06/07/2011  . Cancer ([place])    breast ca  . GERD (gastroesophageal reflux disease)   . Hypercholesterolemia   . Hypertension   . Irritable bowel syndrome (IBS)   . Onychomycosis   . Sigmoid diverticulosis   Benign tremor. History of palpitations with Holter monitor in 2008 showing PACs.   PAST SURGICAL HISTORY: Past Surgical History:  Procedure Laterality Date  . ABDOMINAL HYSTERECTOMY    . BREAST LUMPECTOMY    . CATARACT EXTRACTION    . CHOLECYSTECTOMY    . KNEE ARTHROSCOPY      FAMILY HISTORY Family History  Problem Relation Age of Onset  . Breast cancer Sister 62  . Breast cancer Cousin        dx in her 48s  . Bone cancer Mother   [name] died in an automobile accident.  [name] died with  "bone cancer."  The patient had 3 sisters.  One had breast cancer develop in her 70s.  There is also a cousin with breast cancer but no history of ovarian cancer or other history of cancer in first-degree relatives.   GYNECOLOGIC HISTORY: No LMP recorded. Patient has had a hysterectomy. She is GX, P4, first pregnancy to term at age 74.  She was on the hormone replacement after her hysterectomy but only briefly and stopped when she had the initial diagnosis of breast cancer in 1996. She underwent hysterectomy without BSO.   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  Her significant other [name] developed Alzheimer's and died from Covid infection early 2021. The patient lives with her daughter Theresa Wallace who used to be a Network engineer for Theresa Wallace, and [name]'s husband, [name], who is a physician's assistant with Dr. Durward Fortes.  The patient has 12 grandchildren and 5 great-grandchildren.  She is Jewish and corrected me that it's Hannukah  not Christmas for her.    ADVANCED DIRECTIVES: In place   HEALTH MAINTENANCE:  Social History   Tobacco Use  . Smoking status: Former Smoker    Quit date: 05/22/1968    Years since quitting: 52.4  . Smokeless tobacco: Never Used  Vaping Use  . Vaping Use: Never used  Substance Use Topics  . Alcohol use: Yes    Comment: 2 yearly  . Drug use: No     Colonoscopy:  PAP: s/p remote hysterectomy  Bone density:  10/23/2011, normal  Lipid panel: Theresa Wallace, UTD   Allergies  Allergen Reactions  . Cantaloupe (Diagnostic)   . Cantaloupe Extract Allergy Skin Test Other (See Comments)  . Ciprofloxacin   . Epinephrine   . Pineapple   . Tamoxifen   . Mupirocin Rash    Current Outpatient Medications  Medication Sig Dispense Refill  . azithromycin (ZITHROMAX) 250 MG tablet Take 2 tablets today, then one  tablet daily until done 6 each 0  . Calcium Carbonate-Vitamin D (CALCIUM-VITAMIN D) 500-200 MG-UNIT per tablet Take 1 tablet by mouth 2 (two) times daily with a meal.    . cholecalciferol (VITAMIN D) 400 UNITS TABS Take 2,000 Units by mouth daily.    Marland Kitchen esomeprazole (NEXIUM) 20 MG capsule Take 1 capsule (20 mg total) by mouth as needed.    . fluconazole (DIFLUCAN) 150 MG tablet Take 150 mg by mouth as needed.     . gabapentin (NEURONTIN) 100 MG capsule TAKE ONE CAPSULE BY MOUTH EVERY NIGHT AT BEDTIME, AND MAY INCREASE TO TAKING ONE CAPSULE TWICE DAILY IF NEEDED THEREAFTER 60 capsule 2  . HYDROcodone bit-homatropine (HYCODAN) 5-1.5 MG/5ML syrup Take 5 mLs by mouth every 6 (six) hours as needed for cough. 120 mL 0  . ketoconazole (NIZORAL) 2 % cream Apply 1 application topically daily as needed for irritation.     Marland Kitchen ketotifen (ZADITOR) 0.025 % ophthalmic solution Place 1 drop into both eyes as needed (itchy eyes).    Marland Kitchen levothyroxine (SYNTHROID, LEVOTHROID) 25 MCG tablet Take 25 mcg by mouth daily.    . metroNIDAZOLE (METROGEL) 0.75 % gel Apply topically 2 (two) times daily. For rosacea    . promethazine-codeine (PHENERGAN WITH CODEINE) 6.25-10 MG/5ML syrup Take 5 mLs by mouth every 8 (eight) hours as needed for cough. 120 mL 0   No current facility-administered medications for this visit.    OBJECTIVE: There were no vitals filed for this visit. There is no height or weight on file to calculate BMI.  There were no vitals filed for this visit.   Telemedicine visit 10/06/2020    LAB RESULTS: Lab Results  Component Value Date   WBC 8.2 09/30/2020   NEUTROABS 6.6 09/30/2020   HGB 12.2 09/30/2020   HCT 36.8 09/30/2020   MCV 95.3 09/30/2020   PLT 272 09/30/2020      Chemistry      Component Value Date/Time   NA 131 (L) 09/30/2020 1624   NA 129 (L) 05/07/2017 1045   K 4.6 09/30/2020 1624   K 5.0 05/07/2017 1045   CL 98 09/30/2020 1624   CL 104 10/24/2012 1014   CO2 26 09/30/2020 1624    CO2 25 05/07/2017 1045   BUN 27 (H) 09/30/2020 1624   BUN 17.6 05/07/2017 1045   CREATININE 1.38 (H) 09/30/2020 1624   CREATININE 1.39 (H) 03/11/2020 1104   CREATININE 1.1 05/07/2017 1045      Component Value Date/Time   CALCIUM 9.2 09/30/2020 1624  CALCIUM 9.3 05/07/2017 1045   ALKPHOS 78 09/30/2020 1624   ALKPHOS 37 (L) 05/07/2017 1045   AST 21 09/30/2020 1624   AST 18 03/11/2020 1104   AST 20 05/07/2017 1045   ALT 17 09/30/2020 1624   ALT 12 03/11/2020 1104   ALT 13 05/07/2017 1045   BILITOT 1.1 09/30/2020 1624   BILITOT 0.6 03/11/2020 1104   BILITOT 1.55 (H) 05/07/2017 1045      IMAGING STUDIES: DG Chest 2 View  Result Date: 09/30/2020 CLINICAL DATA:  Cough. EXAM: CHEST - 2 VIEW COMPARISON:  April 10, 2019 FINDINGS: There is a small left effusion with associated atelectasis. No pneumothorax. No nodules or masses. The heart, hila, mediastinum, lungs, and pleura are otherwise unremarkable. IMPRESSION: Small left pleural effusion with underlying atelectasis. No other abnormalities. Electronically Signed   By: Dorise Bullion III M.D   On: 09/30/2020 17:00   CT Chest Wo Contrast  Result Date: 09/30/2020 CLINICAL DATA:  History of breast carcinoma with metastatic disease and findings of pleural effusion on recent chest x-ray EXAM: CT CHEST WITHOUT CONTRAST TECHNIQUE: Multidetector CT imaging of the chest was performed following the standard protocol without IV contrast. COMPARISON:  04/21/19 PET-CT FINDINGS: Cardiovascular: Limited due to lack of IV contrast. Atherosclerotic calcifications of the thoracic aorta and its branches are seen. No aneurysmal dilatation is noted. No cardiac enlargement is seen. Mild coronary calcifications are noted. The pulmonary artery is not significantly enlarged. Mediastinum/Nodes: Thoracic inlet shows no acute abnormality. No sizable hilar or mediastinal adenopathy is noted. The esophagus as visualized is within normal limits. Lungs/Pleura:  Nodularity is noted along the pleura on the left increased in appearance when compared with the prior PET-CT likely representing progressive pleural metastatic disease. Some nodularity along the right hemidiaphragm is noted as well suspicious for increased pleural metastatic disease. Small left-sided pleural effusion is noted. 12 mm nodular density is noted along the anterior aspect of the right hemidiaphragm suspicious for lung metastatic disease. This has increased in size from the prior PET-CT of which time it measured 7 mm. Upper Abdomen: Visualized upper abdomen demonstrates lesions within the liver. The overall appearance is similar to that seen on the prior PET-CT examination. Musculoskeletal: Stable sclerotic focus in the lower thoracic spine is noted. No new lytic or sclerotic lesions are identified within the rib cage or thoracic spine. Chronic compression deformity is noted at T11 and L1. Nodularity is noted in the left axilla measuring up to 19 mm increased in size when compared with the prior exam at which time it measured 12 mm. Dense breast tissue is noted. Calcifications are seen within the left breast and stable. IMPRESSION: Increase in pleural nodularity particularly on the left consistent with progressive metastatic disease. Enlarging right middle lobe nodule is seen suggestive of progressive metastatic disease. Increasing soft tissue density in the left axilla is noted also likely representing some focal progression. Stable hypodense lesions are noted within the liver both in the dome and posterior right hepatic lobe. These are unchanged from the prior PET-CT and consistent with metastatic disease. Aortic Atherosclerosis (ICD10-I70.0). Electronically Signed   By: Inez Catalina M.D.   On: 09/30/2020 17:54      ASSESSMENT: 87 y.o. BRCA negative [place] woman:  (1) Status post left lumpectomy in 1996 for a T1b N0 grade 2 invasive ductal carcinoma which was strongly estrogen and progesterone  receptor positive, treated with radiation, then anastrozole, then Evista, all treatment discontinued July 2012  METASTATIC DISEASE: July 2012 (2) recurrence  to the lining of the left lung and liver documented by liver biopsy July 2012, showing metastatic adenocarcinoma estrogen and progesterone receptor positive at 96% and 97% respectively; there was no Her-2 amplification.   (a) left axillary lymph node biopsy 09/27/2016 shows breast cancer, 95% estrogen repeat receptor positive with strong staining intensity, progesterone and HER-2 negative.  (3)  She has been on exemestane since early July of 2012, with chest CT scan April 2013 showing complete resolution of her lung and liver metastases  (a) bone density in June of 08/09/2011 was normal  (b) bone density 03/20/2016 was normal with a T score of -0.6  (c) exemestane continued at the time of disease progression noted May 2018  (d) exemestane discontinued April 2019  (4) MRI of the lumbar spine 09-2012 showed significant degenerative disease but no evidence of metastatic disease to bone.  (5) disease progression noted May 2018: CT scan shows liver, lung, and bone lesions  (6) fulvestrant started 09/26/2016, palbociclib added 10/24/2016 at 125 mg daily, 21 days on. 7 days off  (a) palbociclib dose decreased to 170m daily on 02/14/2017  (b) PET scan 10/16/2018 shows progression in the liver no new sites of disease  (c) fulvestrant discontinued; letrozole substituted  to minimize visits during the pandemic  (d) palbociclib discontinued, ribociclib substituted  (e) consider consult to interventional radiology for Yttrium treatment of liver lesion  (7) denosumab/Xgeva started 11/21/2016, repeated every 28 days until 11/19/2017, then every 8 weeks thereafter  (a) Xgeva changed to every 12 weeks beginning with the 05/06/2018 dose  (b) Xgeva held after the October 2020 dose because of the pandemic  (c) denosumab resumed 08/28/2019, now repeated every  6 months  (8) Caris requested 10/21/2018, from 09/27/2016 lymph node biopsy  (a) patient is BRCA `1-2 negative  (9) palliative care referral placed Oct 01 2020.  Visit expected November 02, 2020   PLAN:  RMarjoriais a little bit better as far as her cough and breathing is concerned, doubtless because of the Zithromax.  She is also using the cough syrup but only at night.  We discussed fall precautions in detail.  At this point the patient says she would like to live longer is possible and we are putting her back on letrozole.  We are not resuming the case Callie which has many side effects.  We will check again by virtual visit in 1week.  If things continue to improve we will consider switching to more aggressive treatments otherwise we will continue with letrozole alone despite the results of the recent CT scan.   GVirgie Dad Lotoya Casella, MD 10/06/20 9:54 AM Medical On[facility] H[facility] [id number]. [phone]   Fax. [phone]  I, KWilburn Mylar am acting as scribe for Dr. GVirgie Dad Ebert Forrester.  I, GLurline DelMD, have reviewed the above documentation for accuracy and completeness, and I agree with the above.   *Total Encounter Time as defined by the Centers for Medicare and Medicaid Services includes, in addition to the face-to-face time of a patient visit (documented in the note above) non-face-to-face time: obtaining and reviewing outside history, ordering and reviewing medications, tests or procedures, care coordination (communications with other health care professionals or caregivers) and documentation in the medical record.

## 2020-10-14 ENCOUNTER — Other Ambulatory Visit (HOSPITAL_COMMUNITY): Payer: Self-pay

## 2020-10-14 DIAGNOSIS — H938X3 Other specified disorders of ear, bilateral: Secondary | ICD-10-CM | POA: Diagnosis not present

## 2020-10-22 ENCOUNTER — Other Ambulatory Visit: Payer: Self-pay | Admitting: Orthopedic Surgery

## 2020-10-22 DIAGNOSIS — M159 Polyosteoarthritis, unspecified: Secondary | ICD-10-CM | POA: Diagnosis not present

## 2020-10-22 DIAGNOSIS — C50919 Malignant neoplasm of unspecified site of unspecified female breast: Secondary | ICD-10-CM | POA: Diagnosis not present

## 2020-10-22 DIAGNOSIS — E039 Hypothyroidism, unspecified: Secondary | ICD-10-CM | POA: Diagnosis not present

## 2020-10-22 DIAGNOSIS — C50912 Malignant neoplasm of unspecified site of left female breast: Secondary | ICD-10-CM | POA: Diagnosis not present

## 2020-10-22 DIAGNOSIS — E782 Mixed hyperlipidemia: Secondary | ICD-10-CM | POA: Diagnosis not present

## 2020-10-22 DIAGNOSIS — F4321 Adjustment disorder with depressed mood: Secondary | ICD-10-CM | POA: Diagnosis not present

## 2020-10-22 DIAGNOSIS — J45991 Cough variant asthma: Secondary | ICD-10-CM | POA: Diagnosis not present

## 2020-10-22 DIAGNOSIS — I10 Essential (primary) hypertension: Secondary | ICD-10-CM | POA: Diagnosis not present

## 2020-10-22 DIAGNOSIS — K219 Gastro-esophageal reflux disease without esophagitis: Secondary | ICD-10-CM | POA: Diagnosis not present

## 2020-11-02 ENCOUNTER — Other Ambulatory Visit: Payer: Self-pay

## 2020-11-02 ENCOUNTER — Other Ambulatory Visit: Payer: PPO | Admitting: Nurse Practitioner

## 2020-11-02 VITALS — BP 162/64 | HR 83 | Resp 18

## 2020-11-02 DIAGNOSIS — Z515 Encounter for palliative care: Secondary | ICD-10-CM

## 2020-11-02 DIAGNOSIS — C50919 Malignant neoplasm of unspecified site of unspecified female breast: Secondary | ICD-10-CM

## 2020-11-02 NOTE — Progress Notes (Signed)
[facility] Consult Note Telephone: [phone]  Fax: [phone]    Date of encounter: 11/02/20 PATIENT NAME: Theresa Wallace [place] [address]   [phone] (home)  DOB: 1923-09-17 MRN: [id number]  PRIMARY CARE PROVIDER:    [name], MD,  [place] [address] [place] [address] [phone]  REFERRING PROVIDER:   [name], MD [address] [place],  [place] [address] [phone]  RESPONSIBLE PARTY:    Contact Information     Name Relation Home Work Theresa Wallace Daughter [phone]  [phone]     Daughter Theresa Wallace from [place] present during visit.  I met face to face with patient in home. Her daughters Theresa Wallace and Theresa Wallace (from [place]) present during visit.  Palliative Care was asked to follow this patient by consultation request of  Theresa Huddle, MD to address advance care planning and complex medical decision making. This is the initial visit.                                   ASSESSMENT AND PLAN / RECOMMENDATIONS:   Advance Care Planning/Goals of Care: Goals include to maximize quality of life and symptom management. Our advance care planning conversation included a discussion about:    The value and importance of advance care planning  Experiences with loved ones who have been seriously ill or have died  Exploration of personal, cultural or spiritual beliefs that might influence medical decisions  Exploration of goals of care in the event of a sudden injury or illness  Review of an  advance directive document . CODE STATUS: DNR Goal of care: Patient's goal of care is comfort while preserving as much function as possible. Family's goal is for her to be pain free. Directives: Patient and her daughters reiterated decision for no resuscitation for patient in the event of cardiac or respiratory arrest. Patient does not have a DNR form in home, DNR form  signed for patient today. Family advised to keep form in a readily accessible area in home for easy assess if needed. Patient has a signed MOST form on [facility] EMR. Details of the MOST form include comfort measures, antibiotics if indicated, IV fluids if indicated and no feeding tube. MOST form elections reviewed today with no changes made.  Palliative care will continue to provide support to patient, family and medical team.  Symptom Management/Plan: Metastatic breast cancer: No report of uncontrolled pain, patient denied pain today. Patient verbalized desire to continue treatment at this time. Family asked about eligibility criteria for Hospice care, they were made aware that patient may not qualify for Hospice care at this time as she is still receiving treatment for her cancer. Home Hospice services and philosophy were discussed and explained in detail. Family verbalized understanding that patient has a terminal disease, they however want to support patient's desire for treatment, saying they want to honor patient's wishes and would let me know when patient is ready for Hospice. Validation provided.  We discussed the difference between agressive treatment  and comfort measure, we reviewed patient's medications. We discuss the role of Levothyroxine and agreed that it would reduce patient's pill burden to consider discontinuing the medication. Patient is on Levothyroxine 21mg, family will discuss with patient's PCP at next visit, may need to check TSH. Provided general support and encouragement. Questions and concerns were addressed. Patient  and family was encouraged to call with questions and/or concerns. My business card was provided.  Follow up Palliative Care Visit: Palliative care will continue to follow for complex medical decision making, advance care planning, and clarification of goals. Return in about 4 weeks or prn.  PPS: 50%  HOSPICE ELIGIBILITY/DIAGNOSIS: TBD  History obtained  from review of Epic EMR, and discussion with [name] and her daughters.  HISTORY OF PRESENT ILLNESS:  Theresa Wallace is a 87 y.o. year old female with multiple medical problems including Metastatic breast cancer, HTN, IBS, Hypercholesterolemia, CKD 3, COPD, hypothyroidism, mild cognitive impairment. Patient with breast cancer initially diagnosed in 1996 now with mets to lungs and liver. She is currently on Letrozole, this is her desire to pursue treatment and hopefully live longer despite recent CT scan of chest on 09/30/2020 showing progression of metastatic disease and report of increase in her cancer makers. Her liver lesions was however noted to be stable. Family report ongoing decline in both function and cognition with patient requiring more assistance completing her ADLs. She struggles with both bowel and bladder incontinence. Requires standby assist with transfers and ambulation. Last fall was a month ago, family unsure of cause. She was taken off her blood pressure meds due to concerns of hypotension. Patient was treated for respiratory infection with Zithromax, on 10/04/2020, patient report improvement in symptoms today. No report of cough, fever, chills, increased SOB, no report of uncontrolled pain. No report of nausea or vomiting. Rest of 10 point systems reviewed and are negative except as documented in history of present illness above   I reviewed available labs, medications, imaging, studies and related documents from the EMR.  Records reviewed and summarized above.   Vitals:   11/02/20 1006  BP: (!) 162/64  Pulse: 83  Resp: 18  SpO2: 93%   Physical Exam: Current and past weights: 160lbs, BMI 27.46kg/m2 General: frail appearing, cooperative, sitting in chair in dinning area in NAD activley participating in visit discussion. EYES: anicteric sclera, no discharge  ENMT: very hard of hearing, oral mucous membranes moist CV: RRR, no LE edema Pulmonary: LCTA, no increased work of  breathing, no cough, room air Abdomen: soft and non tender, no ascites GU: deferred MSK: sarcopenia, limited ROM to left shoulder, ambulatory Skin: warm and dry, no rashes Neuro: generalized weakness,  no cognitive impairment Psych: non-anxious affect, A and O x 3 Hem/lymph/immuno: no widespread bruising  CURRENT PROBLEM LIST:  Patient Active Problem List   Diagnosis Date Noted   CKD (chronic kidney disease), stage III ([place]) 04/03/2018   Essential hypertension 04/03/2018   Hyponatremia 04/01/2018   Goals of care, counseling/discussion 08/27/2017   Malignant neoplasm of overlapping sites of left breast in female, estrogen receptor positive ([facility]) 09/11/2016   Cancer of left breast ([place]) 09/13/2015   Sensorineural hearing loss (SNHL), bilateral 08/23/2015   Bilateral impacted cerumen 08/23/2015   Genetic testing 12/18/2014   COPD (chronic obstructive pulmonary disease) ([place]) 09/07/2014   Breast cancer metastasized to multiple sites ([facility]) 06/07/2011   PAST MEDICAL HISTORY:  Active Ambulatory Problems    Diagnosis Date Noted   Breast cancer metastasized to multiple sites ([place]) 06/07/2011   COPD (chronic obstructive pulmonary disease) ([place]) 09/07/2014   Genetic testing 12/18/2014   Cancer of left breast ([facility]) 09/13/2015   Sensorineural hearing loss (SNHL), bilateral 08/23/2015   Malignant neoplasm of overlapping sites of left breast in female, estrogen receptor positive ([facility]) 09/11/2016   Goals of care, counseling/discussion 08/27/2017   Hyponatremia  04/01/2018   CKD (chronic kidney disease), stage III ([facility]) 04/03/2018   Essential hypertension 04/03/2018   Bilateral impacted cerumen 08/23/2015   Resolved Ambulatory Problems    Diagnosis Date Noted   No Resolved Ambulatory Problems   Past Medical History:  Diagnosis Date   Cancer ([facility])    GERD (gastroesophageal reflux disease)    Hypercholesterolemia    Hypertension    Irritable bowel syndrome (IBS)    Onychomycosis     Sigmoid diverticulosis    SOCIAL HX:  Social History   Tobacco Use   Smoking status: Former    Pack years: 0.00    Types: Cigarettes    Quit date: 05/22/1968    Years since quitting: 52.4   Smokeless tobacco: Never  Substance Use Topics   Alcohol use: Yes    Comment: 2 yearly   FAMILY HX:  Family History  Problem Relation Age of Onset   Breast cancer Sister 32   Breast cancer Cousin        dx in her 56s   Bone cancer Mother     ALLERGIES:  Allergies  Allergen Reactions   Cantaloupe (Diagnostic)    Cantaloupe Extract Allergy Skin Test Other (See Comments)   Ciprofloxacin    Epinephrine    Pineapple    Tamoxifen    Mupirocin Rash     I spent 80 minutes providing this consultation. More than 50% of the time in this consultation was spent in counseling and care coordination.  Thank you for the opportunity to participate in the care of Ms. Liby.  The palliative care team will continue to follow. Please call our office at [phone] if we can be of additional assistance.   Jari Favre, DNP, AGPCNP-BC  COVID-19 PATIENT SCREENING TOOL Asked and negative response unless otherwise noted:   Have you had symptoms of covid, tested positive or been in contact with someone with symptoms/positive test in the past 5-10 days?

## 2020-11-10 DIAGNOSIS — E039 Hypothyroidism, unspecified: Secondary | ICD-10-CM | POA: Diagnosis not present

## 2020-11-10 DIAGNOSIS — R0902 Hypoxemia: Secondary | ICD-10-CM | POA: Diagnosis not present

## 2020-11-10 DIAGNOSIS — K219 Gastro-esophageal reflux disease without esophagitis: Secondary | ICD-10-CM | POA: Diagnosis not present

## 2020-11-10 DIAGNOSIS — I10 Essential (primary) hypertension: Secondary | ICD-10-CM | POA: Diagnosis not present

## 2020-11-10 DIAGNOSIS — C50912 Malignant neoplasm of unspecified site of left female breast: Secondary | ICD-10-CM | POA: Diagnosis not present

## 2020-11-10 DIAGNOSIS — R059 Cough, unspecified: Secondary | ICD-10-CM | POA: Diagnosis not present

## 2020-11-10 DIAGNOSIS — J90 Pleural effusion, not elsewhere classified: Secondary | ICD-10-CM | POA: Diagnosis not present

## 2020-11-10 DIAGNOSIS — J45991 Cough variant asthma: Secondary | ICD-10-CM | POA: Diagnosis not present

## 2020-11-10 DIAGNOSIS — F4321 Adjustment disorder with depressed mood: Secondary | ICD-10-CM | POA: Diagnosis not present

## 2020-11-10 DIAGNOSIS — M159 Polyosteoarthritis, unspecified: Secondary | ICD-10-CM | POA: Diagnosis not present

## 2020-11-10 DIAGNOSIS — R269 Unspecified abnormalities of gait and mobility: Secondary | ICD-10-CM | POA: Diagnosis not present

## 2020-11-25 DIAGNOSIS — H938X1 Other specified disorders of right ear: Secondary | ICD-10-CM | POA: Diagnosis not present

## 2020-12-03 ENCOUNTER — Other Ambulatory Visit: Payer: Self-pay

## 2020-12-03 ENCOUNTER — Other Ambulatory Visit: Payer: PPO | Admitting: Nurse Practitioner

## 2020-12-03 VITALS — BP 142/80 | HR 90 | Resp 18

## 2020-12-03 DIAGNOSIS — K5900 Constipation, unspecified: Secondary | ICD-10-CM

## 2020-12-03 DIAGNOSIS — R531 Weakness: Secondary | ICD-10-CM

## 2020-12-03 DIAGNOSIS — R1310 Dysphagia, unspecified: Secondary | ICD-10-CM

## 2020-12-03 DIAGNOSIS — Z515 Encounter for palliative care: Secondary | ICD-10-CM

## 2020-12-03 NOTE — [facility] Notes (Signed)
[facility] Consult Note Telephone: [phone]  Fax: [phone]   Date of encounter: 12/03/20 PATIENT NAME: Theresa Wallace [address]   [phone] (home)  DOB: 12/14/1923 MRN: [id number]  PRIMARY CARE PROVIDER:    [name], MD,  [place] [address] [place] [address] [phone]  REFERRING PROVIDER:   [name], MD [address] [place],  [place] [address] [phone]  RESPONSIBLE PARTY:    Contact Information     Name Relation Home Work Theresa Wallace Daughter [phone]  [phone]     I met face to face with patient in home. Her daughter Theresa Wallace present during visit. Palliative Care was asked to follow this patient by consultation request of  Theresa Huddle, MD to address advance care planning and complex medical decision making. This is a follow up visit.                                  ASSESSMENT AND PLAN / RECOMMENDATIONS:   Advance Care Planning/Goals of Care: Goals include to maximize quality of life and symptom management.  The value and importance of advance care planning  Exploration of personal, cultural or spiritual beliefs that might influence medical decisions  CODE STATUS: DNR Goal of care: Patient's goal of care is comfort while preserving function. Directives: Signed DNR and MOST forms present in home, copy on Theresa Wallace EMR. Details of the MOST form include comfort measures, antibiotics if indicated, IV fluids if indicated and no feeding tube.  Family report patient in the process of completing documentation to donate her body [facility] after her death.  Symptom Management/Plan: Generalized weakness: Continue supportive care. Maintain safety, prevent falls. Continue to assist with ADLs, encourage patient to do as much as possible for self. May consider home physical therapy if condition worsens.   Dysphagia: Patient daughter not interested in speech therapy evaluation saying coughing during meals happens occasionally. Recommend offering patient soft meals, encourage drinking fluids between eating. Constipation: managed by offering patient oatmeal with raisins. Continue current plan of care.  Provided general support and encouragement. Questions and concerns were addressed. Patient and family was encouraged to call with questions and/or concerns.  Follow up Palliative Care Visit: Palliative care will continue to follow for complex medical decision making, advance care planning, and clarification of goals. Return in about 6 weeks or prn.  I spent 60 minutes providing this consultation. More than 50% of the time in this consultation was spent in counseling and care coordination.  PPS: 50%  HOSPICE ELIGIBILITY/DIAGNOSIS: TBD  CHIEF COMPLAIN: generalization weakness  History obtained from review of Epic EMR and discussion with Theresa Wallace and her daughter.  HISTORY OF PRESENT ILLNESS:  Theresa Wallace is a 87 y.o. year old female with multiple medical problems including Metastatic breast cancer (on Letrozole), HTN, IBS, Hypercholesterolemia, CKD 3, COPD, hypothyroidism, mild cognitive impairment.  Patient with reports of generalized weakness in the context of deconditioning related to advance age and her comorbid conditions is ongoing and worsening. Daughter report patient collapsed and fell 3 days ago, no significant injury reported. Family unsure of cause of the collapse but said patient recovered with rest. Report patient is now dependent on wheelchair for ambulation, requires moderate assist with her ADLs. She is still mostly continent of bowel and bladder.  Patient's Levothyroxine was discontinued by  her PCP on 11/10/2020. Family report patient had panic attacks after discontinuation of Levothyroxine, report condition has since resolved without intervention. Patient report feeling well  today, endorsed fair appetite. Report occasional cough with meals, especially with dry foods. No report of chewing problem, no report of fever or chills, or increased SOB. Ten systems reviewed and are negative for acute change, except as noted in the HPI.   I reviewed available labs, medications, imaging, studies and related documents from the EMR.  Records reviewed and summarized above.   Physical Exam: General: frail appearing, cooperative, sitting in chair in dinning area in NAD  EYES: anicteric sclera, no discharge ENMT: very hard of hearing, oral mucous membranes moist CV: RRR, no LE edema Pulmonary: LCTA, no increased work of breathing, no cough, room air MSK: sarcopenia, limited ROM to left shoulder, ambulatory Skin: warm and dry, no rashes, mild bruise noted to right temple area Neuro: generalized weakness,  mild cognitive impairment Psych: non-anxious affect, A and O x 2 Hem/lymph/immuno: no widespread bruising  Past Medical History:  Diagnosis Date   Breast cancer metastasized to multiple sites ([facility]) 06/07/2011   Cancer ([facility])    breast ca   GERD (gastroesophageal reflux disease)    Hypercholesterolemia    Hypertension    Irritable bowel syndrome (IBS)    Onychomycosis    Sigmoid diverticulosis     Thank you for the opportunity to participate in the care of Theresa Wallace.  The palliative care team will continue to follow. Please call our office at [phone] if we can be of additional assistance.   Theresa Favre, DNP, AGPCNP-BC  COVID-19 PATIENT SCREENING TOOL Asked and negative response unless otherwise noted:   Have you had symptoms of covid, tested positive or been in contact with someone with symptoms/positive test in the past 5-10 days?

## 2020-12-07 ENCOUNTER — Encounter: Payer: Self-pay | Admitting: Oncology

## 2020-12-08 ENCOUNTER — Other Ambulatory Visit: Payer: Self-pay

## 2020-12-08 ENCOUNTER — Other Ambulatory Visit: Payer: PPO | Admitting: *Deleted

## 2020-12-08 ENCOUNTER — Other Ambulatory Visit: Payer: PPO

## 2020-12-08 VITALS — BP 142/88 | HR 99 | Temp 97.7°F | Resp 18

## 2020-12-08 DIAGNOSIS — Z515 Encounter for palliative care: Secondary | ICD-10-CM

## 2020-12-08 NOTE — [facility] Notes (Signed)
[facility] COMMUNITY PALLIATIVE CARE RN NOTE  PATIENT NAME: Theresa Wallace DOB: Dec 02, 1923 MRN: [id number]  PRIMARY CARE PROVIDER: [name], MD  RESPONSIBLE PARTY: Julienne Kass (daughter) Acct ID - Guarantor Home Phone Work Phone Relationship Acct Type  [id number] Alexis [phone]  Self P/F     [address], [place], [place] [address]   Covid-19 Pre-screening Negative  PLAN OF CARE and INTERVENTION:  ADVANCE CARE PLANNING/GOALS OF CARE: Goal is that patient has a comfortable death when the time comes, to avoid hospitalizations and keeping her well. She has a DNR and a MOST form.  PATIENT/CAREGIVER EDUCATION: Explained the difference of Palliative care vs hospice, symptom management, safe mobility DISEASE STATUS: Joint palliative care follow-up visit made with Katheren Puller, MSW. Daughter has been concerned regarding patient's overall decline.  She reports that patient's dementia has exacerbated. She seems more catatonic and staring off. She is hard of hearing. She had a fall on 12/03/20 and since then her mobility has been affected. Observed her stand with assistance from her recliner. She was able to ambulate about 20 ft to her bathroom right next to her bedroom. Anything further than this, she must be transported via wheelchair. She is also experiencing some major anxiety and panic attacks mainly when she is walking. She becomes shaky/nervous and says that her legs are about to give out. Daughter says it seems that she has become more anxious since discontinuing Synthroid. She requires assistance with showers, dressing and now with feeding at times. She is intermittently incontinent of both bowel and bladder and wears Depends. She is forgetting how to do things for herself. She has metastatic breast cancer and taking Letrozole. Her most recent CT scan showed progressive pleural metastatic disease along with a small left sided pleural effusion. She has a 60mm nodular density along her  diaphragm which is an increase from 44mm. She has lesions in the liver. Nodule in her left axilla has increased from 12 mm to 19 mm. This nodule has caused decreased ROM in her left arm. She has a dry nonproductive cough. Her appetite is variable. She is starting to hold food in her mouth with constant chewing. Her meats are cut into small pieces. She is using a smaller spoon. They are working on getting her to drink more fluids. Explained hospice services and that patient would have to discontinue Letrozole. Daughter is agreeable in discontinuing this if she qualifies for hospice. I spoke to our hospice Medical director who says patient is hospice eligible. Made daughter aware of this decision and she wants to proceed with hospice order. Will reach out to Dr. Virgie Dad office for a verbal order.   CODE STATUS: DNR ADVANCED DIRECTIVES: Y MOST FORM: yes PPS: 40%   PHYSICAL EXAM:   VITALS: Today's Vitals   12/08/20 1143  BP: (!) 142/88  Pulse: 99  Resp: 18  Temp: 97.7 F (36.5 C)  TempSrc: Temporal  SpO2: 94%  PainSc: 0-No pain     (Duration of visit and documentation 90 minutes)   Daryl Eastern, RN BSN

## 2020-12-10 ENCOUNTER — Telehealth: Payer: Self-pay | Admitting: *Deleted

## 2020-12-10 NOTE — [facility] Notes (Signed)
COMMUNITY PALLIATIVE CARE SW NOTE  PATIENT NAME: Theresa Wallace DOB: 1924/01/31 MRN: [id number]  PRIMARY CARE PROVIDER: [name], MD  RESPONSIBLE PARTY:  Acct ID - Guarantor Home Phone Work Phone Relationship Acct Type  [id number] Alexis [phone]  Self P/F     [address], [place], [place] [address]     PLAN OF CARE and INTERVENTIONS:             GOALS OF CARE/ ADVANCE CARE PLANNING:  Goal is to keep patient at home safely. Patient is a DNR. SOCIAL/EMOTIONAL/SPIRITUAL ASSESSMENT/ INTERVENTIONS:  SW and RN-M. Nadara Mustard completed a follow-up visit with patient at her home following a call from her daughter who reported that patient was declining. Patient's daughter described a progressive decline in patient since she had a fall on the 7/12. Patient had metastatic breast cancer with lesions to her spine and liver. Patient no longer sleeps in her bed, but in her recliner. She has more profound involuntary tremors in her hands.Patient and pain to her shoulder. Patient ambulates with a walker, but that is only from her chair to the bathroom, otherwise patient is ambulated in a wheelchair throughout the house. Patient is at risk for falls. Patient's appetite is fair, but she has started pocketing food and is encouraged to eat. Patient's meats are cut up, but she does have intermittent coughing when she eats. Generally her appetite varies, particularly at dinner. Patient continues to have a dry cough. Patient is very hard of hearing, however she is responsive to simple yes/no questions. Hospice education was provided to patient's daughter as patient seems to hospice eligible at this. Patient's daughter agreed for nurse to discuss patient's decline to seek an order for a hospice consult. RN to follow-up.  PATIENT/CAREGIVER EDUCATION/ COPING:  Patient appears to be coping well. Patient's family is coping well. Her son-in-law is a PA, and has been instrumental in guiding the family on patient  changes. PERSONAL EMERGENCY PLAN:  911 can be activated for emergencies. COMMUNITY RESOURCES COORDINATION/ HEALTH CARE NAVIGATION:  Patient's daughter has contacted [facility] for in-home care support for patient. FINANCIAL/LEGAL CONCERNS/INTERVENTIONS:  None.  SOCIAL HX:  Social History   Tobacco Use   Smoking status: Former    Types: Cigarettes    Quit date: 05/22/1968    Years since quitting: 52.5   Smokeless tobacco: Never  Substance Use Topics   Alcohol use: Yes    Comment: 2 yearly    CODE STATUS: DNR ADVANCED DIRECTIVES: Yes MOST FORM COMPLETE: Yes HOSPICE EDUCATION PROVIDED: Yes,consult requested  PPS: Due to decline, a hospice consult will be requested for patient.  Duration of visit and documentation: 60 minutes   Katheren Puller, LCSW

## 2020-12-10 NOTE — Telephone Encounter (Signed)
Late entry from 12/09/20  Called and left a voicemail with Dr. Virgie Dad nurse requesting an order for a hospice consult and if Dr. Jana Hakim would agree to be patient's attending. Also advised that I spoke to our hospice medical director Dr. Konrad Dolores who feels that patient is hospice eligible.   Received a call back from his nurse Mateo Flow who advised that Dr. Jana Hakim gave the hospice order and agrees to be the attending.  Sent a communication to patient's daughter to advise ordered was received and sent to the [facility] referral center. She is appreciative.

## 2020-12-13 ENCOUNTER — Other Ambulatory Visit: Payer: Self-pay | Admitting: Oncology

## 2020-12-13 NOTE — [place] Notes (Signed)
Theresa Wallace is declining.  She is now under hospice care.  I called [name] and discussed the whole situation with her.  We simplified her meds so there is no need for her to be on letrozole at this point.  My expectation is that she will probably be bedbound very soon and likely not be here after a few weeks but there are many call possible causes of discomfort and we did go over that in detail.  [name] is comfortable though we are doing everything that needs to be done.  Hospice can be extremely helpful in these last few weeks and they are thankfully on board.  We are canceling further appointments here but of course we will be glad to intervene in any way whatsoever that might be helpful to [name].

## 2020-12-16 ENCOUNTER — Other Ambulatory Visit: Payer: Self-pay | Admitting: *Deleted

## 2020-12-16 ENCOUNTER — Encounter: Payer: Self-pay | Admitting: Oncology

## 2020-12-16 MED ORDER — NITROFURANTOIN MONOHYD MACRO 100 MG PO CAPS: 100.0000 mg | ORAL_CAPSULE | Freq: Every day | ORAL | 0 refills | Status: AC

## 2020-12-17 DIAGNOSIS — U071 COVID-19: Secondary | ICD-10-CM | POA: Diagnosis not present

## 2021-01-19 ENCOUNTER — Telehealth: Payer: Self-pay | Admitting: Hematology

## 2021-01-19 NOTE — Telephone Encounter (Signed)
I received a call from on-call service.  We are informed by [facility] that pt passed away at 8:50pm today February 16, 2021.   Theresa Wallace  Feb 16, 2021

## 2021-01-20 ENCOUNTER — Encounter: Payer: Self-pay | Admitting: Oncology

## 2021-01-20 DEATH — deceased

## 2021-01-26 ENCOUNTER — Encounter: Payer: Self-pay | Admitting: Oncology

## 2021-01-26 ENCOUNTER — Other Ambulatory Visit: Payer: Self-pay | Admitting: Oncology

## 2021-01-27 ENCOUNTER — Other Ambulatory Visit: Payer: Self-pay | Admitting: Oncology

## 2021-01-27 NOTE — Progress Notes (Unsigned)
ID: Theresa Wallace   DOB: March 03, 1924  MR#: [id number]  CHY#:[id number]   Patient Care Team: Theresa Huddle, MD as PCP - General (Internal Medicine) Theresa Wallace, Theresa Dad, MD as Consulting Physician (Oncology) Theresa Provost, PA-C as Consulting Physician (Otolaryngology) OTHER MD:  I connected with Theresa Wallace on 01/27/21 at  by telephone visit and verified that I am speaking with the correct person using two identifiers.   I discussed the limitations, risks, security and privacy concerns of performing an evaluation and management service by telemedicine and the availability of in-person appointments. I also discussed with the patient that there may be a patient responsible charge related to this service. The patient expressed understanding and agreed to proceed.   Other persons participating in the visit and their role in the encounter: Patient's daughter Theresa Wallace Patient's location: Home Provider's location: [place]  Total time spent: 15 min   CHIEF COMPLAINT:  Metastatic Breast Cancer, estrogen receptor positive  CURRENT TREATMENT: Resuming letrozole   INTERVAL HISTORY: Theresa Wallace was contacted today for follow-up of her estrogen receptor positive metastatic breast cancer.   Since her last visit, she presented to the ED on 09/30/2020 with worsening cough and shortness of breath. Chest CT was performed at that time showed: increase in pleural nodularity particularly on the left, consistent with progressive metastatic disease; enlarging right middle lobe nodule is seen suggestive of progressive metastatic disease; increasing soft tissue density in left axilla is noted; stable hypodense lesions are noted with liver, both in dome and posterior right hepatic lobe.  I discussed Theresa Wallace's situation with daughter Theresa Wallace on 10/04/2020.  We stopped her cancer treatments and placed a referral to palliative care.  We simplified her medications, and started Zithromax and a cough syrup to see if  we could make her feel better   REVIEW OF SYSTEMS: Theresa Wallace's cough is better.  She is eating a little better as well.  She fell yesterday and EMS was called.  They checked her blood pressure which was fine and her sugar which was fine.  Her saturation was 91% on room air.  She did soil herself and had to be cleaned.  Her daughter thinks he may have had a small stroke.  At any rate at this point she is walking, with her daughter holding onto her, and is very alert and responsive.  She participated in today's visit.   COVID 19 VACCINATION STATUS: Theresa Wallace x2, most recently 05/2019   HISTORY OF PRESENT ILLNESS: From the prior summary:  Theresa Wallace is an 87 y.o.  Theresa Wallace I saw briefly when she moved to this area in 2007.  She had had a left breast cancer removed at [facility] in [place] in [phone] ([place]).  It was grade 2, measured 1 cm maximally and was strongly estrogen and progesterone-receptor positive.  HER-2 was not checked.  This was T1b NX invasive ductal carcinoma, stage I, and she was treated after radiation with anastrozole for several years, eventually switching to Evista primarily for cost reasons.   More recently, she presented to Theresa Wallace with fatigue and a dry cough.  He auscultated dullness to percussion at the left base and obtained a chest x-ray which confirmed the presence of a left pleural effusion.   Theresa Wallace scheduled Theresa Wallace for an ultrasound-guided left thoracentesis, and this was performed November 09, 2010.  Approximately 1 L of amber-colored fluid was removed.  Cytology from this procedure ([id number]) showed only reactive mesothelial cells.   The  patient was then referred here and set up for a CT of the chest and a PET scan.  The CT of the chest on June 20th showed 2 slightly enlarged left axillary lymph nodes with some irregular contours but more importantly multiple pleural nodules in the left chest measuring up to 4.6 cm.  The right chest was  normal.  There was no pericardial effusion.  There was only a small to moderate left pleural effusion remaining, and aside from left lower lobe collapse or consolidation, there was no evidence of lung parenchymal involvement.  There was no evidence of bone involvement and also no calcified pleural plaques and no worrisome bone involvement.  PET scan on June 27th showed the nodularity along the left pleura to be intensely hypermetabolic.  For example the large lobe nodule measuring 4.5 cm had an SUV max of 12.9.  There was no hypermetabolic mediastinal nodal activity.  The to left axillary lymph nodes were very mildly hypermetabolic.  The left breast was clear.  There was 1 hypermetabolic lesion in the liver measuring 3.2 cm.  On June 27th, Theresa Wallace performed fine-needle aspiration of a 7 mm abnormal-appearing lymph node in the lower left axilla.  The cytology from this procedure, however, ([name]) also was inconclusive showing no evidence of nodal tissue, mostly blood and fatty tissue.   Her subsequent history is as detailed below   PAST MEDICAL HISTORY: Past Medical History:  Diagnosis Date   Breast cancer metastasized to multiple sites ([place]) 06/07/2011   Cancer ([place])    breast ca   GERD (gastroesophageal reflux disease)    Hypercholesterolemia    Hypertension    Irritable bowel syndrome (IBS)    Onychomycosis    Sigmoid diverticulosis   Benign tremor. History of palpitations with Holter monitor in 2008 showing PACs.   PAST SURGICAL HISTORY: Past Surgical History:  Procedure Laterality Date   ABDOMINAL HYSTERECTOMY     BREAST LUMPECTOMY     CATARACT EXTRACTION     CHOLECYSTECTOMY     KNEE ARTHROSCOPY      FAMILY HISTORY Family History  Problem Relation Age of Onset   Breast cancer Sister 43   Breast cancer Cousin        dx in her 57s   Bone cancer Mother   [name] died in an automobile accident.  [name] died with "bone cancer."  The patient had  3 sisters.  One had breast cancer develop in her 39s.  There is also a cousin with breast cancer but no history of ovarian cancer or other history of cancer in first-degree relatives.   GYNECOLOGIC HISTORY: No LMP recorded. Patient has had a hysterectomy. She is GX, P4, first pregnancy to term at age 89.  She was on the hormone replacement after her hysterectomy but only briefly and stopped when she had the initial diagnosis of breast cancer in 1996. She underwent hysterectomy without BSO.   SOCIAL HISTORY: (Updated May 2018) She worked as a Network engineer.  She has been widowed since 2005, her husband [name] dying after a long illness involving strokes and a myocardial infarction.  Her significant other [name] developed Alzheimer's and died from Covid infection early 2021. The patient lives with her daughter Theresa Wallace who used to be a Network engineer for Theresa Wallace, and [name]'s husband, [name], who is a physician's assistant with Dr. Durward Fortes.  The patient has 12 grandchildren and 5 great-grandchildren.  She is Jewish and corrected me that it's Hannukah not  Christmas for her.    ADVANCED DIRECTIVES: In place   HEALTH MAINTENANCE:  Social History   Tobacco Use   Smoking status: Former    Types: Cigarettes    Quit date: 05/22/1968    Years since quitting: 52.7   Smokeless tobacco: Never  Vaping Use   Vaping Use: Never used  Substance Use Topics   Alcohol use: Yes    Comment: 2 yearly   Drug use: No     Colonoscopy:  PAP: s/p remote hysterectomy  Bone density:  10/23/2011, normal  Lipid panel: Theresa Wallace, UTD   Allergies  Allergen Reactions   Cantaloupe (Diagnostic)    Cantaloupe Extract Allergy Skin Test Other (See Comments)   Ciprofloxacin    Epinephrine    Pineapple    Tamoxifen    Mupirocin Rash    Current Outpatient Medications  Medication Sig Dispense Refill   esomeprazole (NEXIUM) 20 MG capsule Take 1 capsule (20 mg total) by mouth as needed.     gabapentin  (NEURONTIN) 100 MG capsule Take 3 capsules (300 mg total) by mouth at bedtime.     ketotifen (ZADITOR) 0.025 % ophthalmic solution Place 1 drop into both eyes as needed (itchy eyes).     levothyroxine (SYNTHROID, LEVOTHROID) 25 MCG tablet Take 25 mcg by mouth daily.     nitrofurantoin, macrocrystal-monohydrate, (MACROBID) 100 MG capsule Take 1 capsule (100 mg total) by mouth daily. 3 capsule 0   No current facility-administered medications for this visit.    OBJECTIVE: There were no vitals filed for this visit. There is no height or weight on file to calculate BMI.  There were no vitals filed for this visit.   Telemedicine visit 10/06/2020    LAB RESULTS: Lab Results  Component Value Date   WBC 8.2 09/30/2020   NEUTROABS 6.6 09/30/2020   HGB 12.2 09/30/2020   HCT 36.8 09/30/2020   MCV 95.3 09/30/2020   PLT 272 09/30/2020      Chemistry      Component Value Date/Time   NA 131 (L) 09/30/2020 1624   NA 129 (L) 05/07/2017 1045   K 4.6 09/30/2020 1624   K 5.0 05/07/2017 1045   CL 98 09/30/2020 1624   CL 104 10/24/2012 1014   CO2 26 09/30/2020 1624   CO2 25 05/07/2017 1045   BUN 27 (H) 09/30/2020 1624   BUN 17.6 05/07/2017 1045   CREATININE 1.38 (H) 09/30/2020 1624   CREATININE 1.39 (H) 03/11/2020 1104   CREATININE 1.1 05/07/2017 1045      Component Value Date/Time   CALCIUM 9.2 09/30/2020 1624   CALCIUM 9.3 05/07/2017 1045   ALKPHOS 78 09/30/2020 1624   ALKPHOS 37 (L) 05/07/2017 1045   AST 21 09/30/2020 1624   AST 18 03/11/2020 1104   AST 20 05/07/2017 1045   ALT 17 09/30/2020 1624   ALT 12 03/11/2020 1104   ALT 13 05/07/2017 1045   BILITOT 1.1 09/30/2020 1624   BILITOT 0.6 03/11/2020 1104   BILITOT 1.55 (H) 05/07/2017 1045      IMAGING STUDIES: No results found.     ASSESSMENT: 86 y.o. BRCA negative [place] Wallace:  (1) Status post left lumpectomy in 1996 for a T1b N0 grade 2 invasive ductal carcinoma which was strongly estrogen and progesterone  receptor positive, treated with radiation, then anastrozole, then Evista, all treatment discontinued July 2012  METASTATIC DISEASE: July 2012 (2) recurrence to the lining of the left lung and liver documented by liver biopsy July 2012,  showing metastatic adenocarcinoma estrogen and progesterone receptor positive at 96% and 97% respectively; there was no Her-2 amplification.   (a) left axillary lymph node biopsy 09/27/2016 shows breast cancer, 95% estrogen repeat receptor positive with strong staining intensity, progesterone and HER-2 negative.  (3)  She has been on exemestane since early July of 2012, with chest CT scan April 2013 showing complete resolution of her lung and liver metastases  (a) bone density in June of 08/09/2011 was normal  (b) bone density 03/20/2016 was normal with a T score of -0.6  (c) exemestane continued at the time of disease progression noted May 2018  (d) exemestane discontinued April 2019  (4) MRI of the lumbar spine 09-2012 showed significant degenerative disease but no evidence of metastatic disease to bone.  (5) disease progression noted May 2018: CT scan shows liver, lung, and bone lesions  (6) fulvestrant started 09/26/2016, palbociclib added 10/24/2016 at 125 mg daily, 21 days on. 7 days off  (a) palbociclib dose decreased to 165m daily on 02/14/2017  (b) PET scan 10/16/2018 shows progression in the liver no new sites of disease  (c) fulvestrant discontinued; letrozole substituted  to minimize visits during the pandemic  (d) palbociclib discontinued, ribociclib substituted  (e) consider consult to interventional radiology for Yttrium treatment of liver lesion  (7) denosumab/Xgeva started 11/21/2016, repeated every 28 days until 11/19/2017, then every 8 weeks thereafter  (a) Xgeva changed to every 12 weeks beginning with the 05/06/2018 dose  (b) Xgeva held after the October 2020 dose because of the pandemic  (c) denosumab resumed 08/28/2019, now repeated every  6 months  (8) Caris requested 10/21/2018, from 09/27/2016 lymph node biopsy  (a) patient is BRCA `1-2 negative  (9) palliative care referral placed Oct 01 2020.  Visit expected November 02, 2020   PLAN:  RMakaylahis a little bit better as far as her cough and breathing is concerned, doubtless because of the Zithromax.  She is also using the cough syrup but only at night.  We discussed fall precautions in detail.  At this point the patient says she would like to live longer is possible and we are putting her back on letrozole.  We are not resuming the case Callie which has many side effects.  We will check again by virtual visit in 1week.  If things continue to improve we will consider switching to more aggressive treatments otherwise we will continue with letrozole alone despite the results of the recent CT scan.   GVirgie Wallace Kharizma Lesnick, MD 01/27/21 9:37 AM Medical Oncology and Hematology [facility] [place] [address]Tel. [phone]   Fax. [phone]  I, KWilburn Mylar am acting as scribe for Dr. GVirgie Wallace Milaina Sher.  I, GLurline DelMD, have reviewed the above documentation for accuracy and completeness, and I agree with the above.   *Total Encounter Time as defined by the Centers for Medicare and Medicaid Services includes, in addition to the face-to-face time of a patient visit (documented in the note above) non-face-to-face time: obtaining and reviewing outside history, ordering and reviewing medications, tests or procedures, care coordination (communications with other health care professionals or caregivers) and documentation in the medical record.

## 2021-02-19 DEATH — deceased

## 2021-03-14 ENCOUNTER — Ambulatory Visit: Payer: PPO

## 2021-03-14 ENCOUNTER — Ambulatory Visit: Payer: PPO | Admitting: Oncology

## 2021-03-14 ENCOUNTER — Other Ambulatory Visit: Payer: PPO
